# Patient Record
Sex: Male | Born: 1975
Health system: Southern US, Community
[De-identification: ages and names within clinical notes are randomized; demographics above are authoritative.]

## PROBLEM LIST (undated history)

## (undated) DIAGNOSIS — Z803 Family history of malignant neoplasm of breast: Secondary | ICD-10-CM

## (undated) DIAGNOSIS — M1990 Unspecified osteoarthritis, unspecified site: Secondary | ICD-10-CM

## (undated) DIAGNOSIS — G8929 Other chronic pain: Secondary | ICD-10-CM

## (undated) DIAGNOSIS — Z800 Family history of malignant neoplasm of digestive organs: Secondary | ICD-10-CM

## (undated) DIAGNOSIS — M25569 Pain in unspecified knee: Secondary | ICD-10-CM

## (undated) DIAGNOSIS — D490 Neoplasm of unspecified behavior of digestive system: Secondary | ICD-10-CM

## (undated) DIAGNOSIS — F32A Depression, unspecified: Secondary | ICD-10-CM

## (undated) DIAGNOSIS — K219 Gastro-esophageal reflux disease without esophagitis: Secondary | ICD-10-CM

## (undated) DIAGNOSIS — F329 Major depressive disorder, single episode, unspecified: Secondary | ICD-10-CM

## (undated) DIAGNOSIS — M549 Dorsalgia, unspecified: Secondary | ICD-10-CM

## (undated) HISTORY — DX: Gastro-esophageal reflux disease without esophagitis: K21.9

## (undated) HISTORY — DX: Family history of malignant neoplasm of breast: Z80.3

## (undated) HISTORY — PX: KNEE SURGERY: SHX244

## (undated) HISTORY — DX: Family history of malignant neoplasm of digestive organs: Z80.0

## (undated) HISTORY — PX: MOUTH SURGERY: SHX715

---

## 2002-06-22 ENCOUNTER — Emergency Department (HOSPITAL_COMMUNITY): Admission: EM | Admit: 2002-06-22 | Discharge: 2002-06-22 | Payer: Self-pay | Admitting: Emergency Medicine

## 2002-06-22 ENCOUNTER — Encounter: Payer: Self-pay | Admitting: Emergency Medicine

## 2002-06-28 ENCOUNTER — Ambulatory Visit (HOSPITAL_COMMUNITY): Admission: RE | Admit: 2002-06-28 | Discharge: 2002-06-28 | Payer: Self-pay | Admitting: Gastroenterology

## 2002-06-29 ENCOUNTER — Encounter: Admission: RE | Admit: 2002-06-29 | Discharge: 2002-06-29 | Payer: Self-pay | Admitting: Family Medicine

## 2002-06-29 ENCOUNTER — Encounter: Payer: Self-pay | Admitting: Family Medicine

## 2002-07-13 ENCOUNTER — Encounter: Payer: Self-pay | Admitting: Gastroenterology

## 2002-07-13 ENCOUNTER — Ambulatory Visit (HOSPITAL_COMMUNITY): Admission: RE | Admit: 2002-07-13 | Discharge: 2002-07-13 | Payer: Self-pay | Admitting: Gastroenterology

## 2002-07-30 ENCOUNTER — Ambulatory Visit (HOSPITAL_COMMUNITY): Admission: RE | Admit: 2002-07-30 | Discharge: 2002-07-30 | Payer: Self-pay | Admitting: Neurosurgery

## 2002-07-30 ENCOUNTER — Encounter: Payer: Self-pay | Admitting: Gastroenterology

## 2002-08-16 ENCOUNTER — Encounter: Admission: RE | Admit: 2002-08-16 | Discharge: 2002-08-16 | Payer: Self-pay | Admitting: Gastroenterology

## 2002-08-16 ENCOUNTER — Encounter: Payer: Self-pay | Admitting: Gastroenterology

## 2003-02-02 ENCOUNTER — Encounter: Admission: RE | Admit: 2003-02-02 | Discharge: 2003-03-09 | Payer: Self-pay | Admitting: Family Medicine

## 2003-02-06 ENCOUNTER — Emergency Department (HOSPITAL_COMMUNITY): Admission: EM | Admit: 2003-02-06 | Discharge: 2003-02-06 | Payer: Self-pay | Admitting: Emergency Medicine

## 2003-02-14 ENCOUNTER — Ambulatory Visit (HOSPITAL_COMMUNITY): Admission: RE | Admit: 2003-02-14 | Discharge: 2003-02-14 | Payer: Self-pay | Admitting: Family Medicine

## 2003-03-04 ENCOUNTER — Ambulatory Visit (HOSPITAL_COMMUNITY): Admission: RE | Admit: 2003-03-04 | Discharge: 2003-03-05 | Payer: Self-pay | Admitting: Neurosurgery

## 2003-05-05 ENCOUNTER — Ambulatory Visit (HOSPITAL_COMMUNITY): Admission: RE | Admit: 2003-05-05 | Discharge: 2003-05-05 | Payer: Self-pay | Admitting: Neurosurgery

## 2003-10-12 ENCOUNTER — Encounter: Admission: RE | Admit: 2003-10-12 | Discharge: 2003-10-12 | Payer: Self-pay | Admitting: Neurosurgery

## 2003-12-05 ENCOUNTER — Ambulatory Visit (HOSPITAL_COMMUNITY): Admission: RE | Admit: 2003-12-05 | Discharge: 2003-12-06 | Payer: Self-pay | Admitting: Neurosurgery

## 2004-03-26 ENCOUNTER — Ambulatory Visit (HOSPITAL_COMMUNITY): Admission: RE | Admit: 2004-03-26 | Discharge: 2004-03-26 | Payer: Self-pay | Admitting: Gastroenterology

## 2004-09-27 ENCOUNTER — Ambulatory Visit (HOSPITAL_COMMUNITY): Admission: RE | Admit: 2004-09-27 | Discharge: 2004-09-27 | Payer: Self-pay | Admitting: Neurosurgery

## 2007-03-20 ENCOUNTER — Ambulatory Visit (HOSPITAL_BASED_OUTPATIENT_CLINIC_OR_DEPARTMENT_OTHER): Admission: RE | Admit: 2007-03-20 | Discharge: 2007-03-20 | Payer: Self-pay | Admitting: Orthopedic Surgery

## 2009-09-19 ENCOUNTER — Ambulatory Visit (HOSPITAL_COMMUNITY): Admission: RE | Admit: 2009-09-19 | Discharge: 2009-09-20 | Payer: Self-pay | Admitting: Specialist

## 2010-03-17 ENCOUNTER — Encounter: Payer: Self-pay | Admitting: *Deleted

## 2010-03-18 ENCOUNTER — Encounter: Payer: Self-pay | Admitting: Gastroenterology

## 2010-03-18 ENCOUNTER — Encounter: Payer: Self-pay | Admitting: *Deleted

## 2010-05-12 LAB — CBC
HCT: 42.9 % (ref 39.0–52.0)
Hemoglobin: 14.8 g/dL (ref 13.0–17.0)
MCH: 32.9 pg (ref 26.0–34.0)
MCH: 33.2 pg (ref 26.0–34.0)
MCHC: 34.5 g/dL (ref 30.0–36.0)
MCV: 95.4 fL (ref 78.0–100.0)
Platelets: 214 10*3/uL (ref 150–400)
Platelets: 256 10*3/uL (ref 150–400)
RBC: 3.97 MIL/uL — ABNORMAL LOW (ref 4.22–5.81)
RBC: 4.5 MIL/uL (ref 4.22–5.81)
RDW: 12.8 % (ref 11.5–15.5)
RDW: 12.9 % (ref 11.5–15.5)
WBC: 12.4 10*3/uL — ABNORMAL HIGH (ref 4.0–10.5)
WBC: 8.5 10*3/uL (ref 4.0–10.5)

## 2010-05-12 LAB — BASIC METABOLIC PANEL
BUN: 9 mg/dL (ref 6–23)
CO2: 29 mEq/L (ref 19–32)
Calcium: 8.9 mg/dL (ref 8.4–10.5)
Creatinine, Ser: 1.01 mg/dL (ref 0.4–1.5)
GFR calc Af Amer: >60 mL/min (ref >60)

## 2010-05-12 LAB — URINALYSIS, ROUTINE W REFLEX MICROSCOPIC
Bilirubin Urine: NEGATIVE
Glucose, UA: NEGATIVE mg/dL
Hgb urine dipstick: NEGATIVE
Ketones, ur: NEGATIVE mg/dL
Nitrite: NEGATIVE
Protein, ur: NEGATIVE mg/dL
Specific Gravity, Urine: 1.019 (ref 1.005–1.030)
Urobilinogen, UA: 0.2 mg/dL (ref 0.0–1.0)
pH: 6.5 (ref 5.0–8.0)

## 2010-05-12 LAB — COMPREHENSIVE METABOLIC PANEL
ALT: 42 U/L (ref 0–53)
AST: 21 U/L (ref 0–37)
Albumin: 4.5 g/dL (ref 3.5–5.2)
Alkaline Phosphatase: 99 U/L (ref 39–117)
BUN: 11 mg/dL (ref 6–23)
CO2: 26 mEq/L (ref 19–32)
Calcium: 9.4 mg/dL (ref 8.4–10.5)
Chloride: 104 mEq/L (ref 96–112)
Creatinine, Ser: 1.03 mg/dL (ref 0.4–1.5)
GFR calc Af Amer: >60 mL/min (ref >60)
GFR calc non Af Amer: >60 mL/min (ref >60)
Glucose, Bld: 99 mg/dL (ref 70–99)
Potassium: 4.2 mEq/L (ref 3.5–5.1)
Sodium: 139 mEq/L (ref 135–145)
Total Bilirubin: 0.2 mg/dL — ABNORMAL LOW (ref 0.3–1.2)
Total Protein: 8 g/dL (ref 6.0–8.3)

## 2010-05-12 LAB — TYPE AND SCREEN
ABO/RH(D): A POS
Antibody Screen: NEGATIVE

## 2010-05-12 LAB — SURGICAL PCR SCREEN
MRSA, PCR: NEGATIVE
Staphylococcus aureus: POSITIVE — AB

## 2010-05-12 LAB — DIFFERENTIAL
Basophils Absolute: 0 10*3/uL (ref 0.0–0.1)
Basophils Relative: 0 % (ref 0–1)
Eosinophils Absolute: 0.2 10*3/uL (ref 0.0–0.7)
Eosinophils Relative: 2 % (ref 0–5)
Lymphocytes Relative: 23 % (ref 12–46)
Lymphs Abs: 1.9 10*3/uL (ref 0.7–4.0)
Monocytes Absolute: 0.5 10*3/uL (ref 0.1–1.0)
Monocytes Relative: 6 % (ref 3–12)
Neutro Abs: 5.8 10*3/uL (ref 1.7–7.7)
Neutrophils Relative %: 69 % (ref 43–77)

## 2010-05-12 LAB — ABO/RH: ABO/RH(D): A POS

## 2010-05-12 LAB — PROTIME-INR
INR: 0.99 (ref 0.00–1.49)
Prothrombin Time: 13 seconds (ref 11.6–15.2)

## 2010-05-12 LAB — APTT: aPTT: 30 seconds (ref 24–37)

## 2010-07-10 NOTE — Op Note (Signed)
Andre Simmons, Andre Simmons              ACCOUNT NO.:  [id number]   MEDICAL RECORD NO.:  [id number]          PATIENT TYPE:  AMB   LOCATION:  DSC                          FACILITY:  [facility]   PHYSICIAN:  Harvie Junior, M.D.   DATE OF BIRTH:  1975/06/27   DATE OF PROCEDURE:  03/20/2007  DATE OF DISCHARGE:                               OPERATIVE REPORT   POSTOPERATIVE DIAGNOSIS:  Medial meniscal tear with chondromalacia  patella.   POSTOPERATIVE DIAGNOSES:  1. Medial meniscal tear with chondromalacia patella.  2. Medial plica.   PROCEDURE:  1. Partial posterior compartment medial meniscectomy with      corresponding chondroplasty in the medial femoral compartment.  2. Chondroplasty of the patella femoral compartment.  3. Debridement of large medial shelf plica.   SURGEON:  Harvie Junior, MD   ASSISTANT:  Marshia Ly, PA-C   ANESTHESIA:  General   BRIEF HISTORY:  He is a 35 year-old male with a long history of having  had significant left knee pain.  He had been treated conservatively for  a period of time.  He was having pain, really in that medial side,  predominantly.  We had treated him conservatively for a period of time.  An MRI was obtained which showed some chondromalacia patella and  otherwise unremarkable.  He was taken to the operating room for  operative knee arthroscopy after failure of conservative care.   PROCEDURE:  The patient was taken to the operating room after adequate  anesthesia was obtained with a general anestheticThe patient was placed  supine on the operating table.  The left leg was prepped and draped in  the usual sterile fashion.  Following this, routine arthroscopy  examination of the knee revealed there was an obvious chondral area of  injury on the medial patella facet.  This was debrided to a smooth  stable rim.  The patella-femoral trochlear had some blistering and some  small defects, which were debrided.  Attention was turned medially.  There  was some really dense medial plaque.  It was really more like scar  than anything else.  This was really where he had the most significant  amounts of pain.  This was debrided at length with a shaver to free up  that medial patellar facet.  Attention was turned to the medial  compartment grade II chondral injury.  There was a meniscal tear at the  meniscal root medially, which was debrided with a straight biting  forceps.  The ACL was evaluated and noted to be a little bit thin in  nature and kind of laying down.  It did come to a solid end point when  you stressed him, but it certainly was not a normal-appearing ACL.  I  went over all and that compartment was normal.  The attention was then  turned back up to the medial, in which final debridement was made back  into the medial plica, where again at length we debrided this really  sort of plica versus scar band that was on that medial side in the area  of his old  incision and then went back up in that patella-femoral joint  and re debrided that.  At that point, the knee was scoped and thoroughly  irrigated.  A final check was made for loosen fragments and pieces of  cartilage, seeing none.  The knee was  suctioned dry.  The arthroscope was pulled and __________ with a  bandage.  Sterile compressive bandage was applied and the patient was  taken to the recovery room and was noted to be in satisfactory  condition.   ESTIMATED BLOOD LOSS:  None.      Harvie Junior, M.D.  Electronically Signed     JLG/MEDQ  D:  03/20/2007  T:  03/20/2007  Job:  [id number]   cc:   Harvie Junior, M.D.

## 2010-07-13 NOTE — Op Note (Signed)
Andre Simmons, Andre Simmons              ACCOUNT NO.:  [id number]   MEDICAL RECORD NO.:  [id number]          PATIENT TYPE:  AMB   LOCATION:  ENDO                         FACILITY:  [facility]   PHYSICIAN:  Graylin Shiver, M.D.   DATE OF BIRTH:  1975-09-19   DATE OF PROCEDURE:  03/26/2004  DATE OF DISCHARGE:                                 OPERATIVE REPORT   PROCEDURE:  Esophagogastroduodenoscopy.   ENDOSCOPIST:  Graylin Shiver, M.D.   INDICATIONS:  Persistent vomiting, weight loss, mild hematemesis.   Informed consent was obtained after explanation of the risks of bleeding,  infection and perforation.   PREMEDICATION:  Fentanyl 75 mcg IV, Versed 8 mg IV.   DESCRIPTION OF PROCEDURE:  With the patient in the left lateral decubitus  position, the Olympus gastroscope was inserted into the oropharynx and  passed into the esophagus.  It was advanced down the esophagus, then into  the stomach, and into the duodenum.  The second portion and bulb of the  duodenum looked normal.  The stomach had a normal-appearing pylorus and  antrum.  The body of the stomach looked normal. Upon retroflexion, there was  some redness up in the fundal area.  I suspect this may be just some  irritation from vomiting. There were no ulcers. The esophagus looked normal  in its entirety.  He tolerated the procedure well without complications.   IMPRESSION:  There is some redness in the fundus of the stomach, otherwise  normal endoscopy.   PLAN:  Since this patient is experiencing vomiting and he had a gastric  motility disorder in the past, and since nothing was found specifically on  this EGD, I am going to order another gastric emptying study.  I will have  the patient take Nexium 40 mg b.i.d. for now.  If the gastric emptying study  is abnormal showing gastroparesis again, I will add a prokinetic agent such  as Zelnorm.      SFG/MEDQ  D:  03/26/2004  T:  03/26/2004  Job:  [id number]   cc:   Virl Son, M.D.

## 2010-07-13 NOTE — Op Note (Signed)
   NAME:  Andre Simmons, Andre Simmons                        ACCOUNT NO.:  [id number]   MEDICAL RECORD NO.:  [id number]                   PATIENT TYPE:  EMS   LOCATION:  MAJO                                 FACILITY:  [facility]   PHYSICIAN:  Graylin Shiver, M.D.                DATE OF BIRTH:  [birth year]   DATE OF PROCEDURE:  06/22/2002  DATE OF DISCHARGE:  06/22/2002                                 OPERATIVE REPORT   PROCEDURE:  Esophagogastroduodenoscopy with biopsy for CLOtest.   INDICATION FOR PROCEDURE:  Chronic heartburn with worsening symptoms  recently.  There has been some associated vomiting and mild hematemesis.   Informed consent was obtained.   PREMEDICATION:  Demerol 40 mg IV, Versed 4 mg IV.   DESCRIPTION OF PROCEDURE:  With the patient in the left lateral decubitus  position, the Olympus gastroscope was inserted into the oropharynx and  passed into the esophagus.  It was advanced down the esophagus, then into  the stomach and into the duodenum.  The second portion and bulb of the  duodenum were normal.  The stomach revealed a mild diffuse erythematous  appearance to the mucosa compatible with gastritis but no ulcers or erosions  were seen.  The findings were very minimal.  Biopsy for CLOtest was obtained  to look for evidence of Helicobacter pylori.  No lesions were seen in the  fundus or cardia of the stomach.  The esophagus looked normal in its  entirety with the esophagogastric junction being located at 42 cm from the  incisor teeth region.  He tolerated the procedure well without  complications.   IMPRESSION:  Gastritis.   RECOMMENDATIONS:  I suspect that the patient's chronic symptoms and  worsening of symptoms is due to gastroesophageal reflux.  There is no  evidence of mucosal damage in the esophagus.  I have recommended that he  continue on Aciphex 20 mg b.i.d. for symptomatic relief for the time being.  Hopefully, in the near future he can decrease the medication to  once a day  again.  In light of his chronic heartburn, he should probably remain on a  proton pump inhibitor long-term.  He has reported to me that the increasing  of the Aciphex to b.i.d. dosage has already helped.                                                Graylin Shiver, M.D.    Germain Osgood  D:  06/28/2002  T:  06/28/2002  Job:  [id number]   cc:   Vikki Ports, M.D.  [address]  [place]  [place] [address]  Fax: [phone]

## 2010-07-13 NOTE — Op Note (Signed)
NAMEVALERIE, FREDIN              ACCOUNT NO.:  [id number]   MEDICAL RECORD NO.:  [id number]          PATIENT TYPE:  OIB   LOCATION:  3009                         FACILITY:  [facility]   PHYSICIAN:  Kathaleen Maser. Pool, M.D.    DATE OF BIRTH:  April 27, 1975   DATE OF PROCEDURE:  12/05/2003  DATE OF DISCHARGE:                                 OPERATIVE REPORT   PREOPERATIVE DIAGNOSES:  Recurrent right L5-S1 herniated nucleus pulposus  with radiculopathy.   POSTOPERATIVE DIAGNOSES:  Recurrent L5-S1 herniated nucleus pulposus with  radiculopathy.   PROCEDURE:  Right L5-S1 re-exploration of laminotomy with redo micro-  diskectomy.   SURGEON:  Kathaleen Maser. Pool, M.D.   ASSISTANT:  Donalee Citrin, M.D.   ANESTHESIA:  General endotracheal.   INDICATIONS FOR PROCEDURE:  The patient is a 35 year old male who is status  post previous right-sided L4-5 and L5-S1 laminotomy and micro-diskectomy for  the treatment of back and right lower extremity pain.  Postoperatively the  patient developed recurrent severe right lower extremity pain consistent  with a right-sided S1 radiculopathy.  A workup has demonstrated evidence of  some element of recurrent subligamentous disk herniation off to the right  side at L5-S1.  We have discussed the options of his management, including  the possibility of undergoing a right-sided L5-S1 redo micro-diskectomy in  hopes of improving his symptoms.  The patient is aware of the risks and  benefits, and wishes to proceed.   DESCRIPTION OF PROCEDURE:  The patient is taken to the operating room and  placed on the operating room table in the supine position.  After an  adequate level of anesthesia was achieved, the patient was placed prone onto  the Wilson frame and appropriately padded.  The patient's lumbar region was  prepped and draped sterilely.  A #10 blade is used to make a linear skin  incision overlying the L5-S1 interspace.  This is carried down sharply in  the midline.  A  subperiosteal dissection is performed through the lamina and  facet joint of L5 and S1 on the right.  A deep self-retaining retractor was  placed.  Intraoperative x-ray was taken.  The level was confirmed.  A  laminotomy was then performed using a high-speed drill and Kerrison  rongeurs, to widen the previously-existing laminotomy.  The epidural scar  was dissected free.  The underlying thecal sac and right-sided L5 nerve root  were identified. The right-sided S1 nerve root was identified.  The  microscope was brought into the field.  Using microdissection, the right-  sided S1 nerve root and the underlying disk herniation.  The epidural venous  plexus and epidural scar were dissected free.  The thecal sac and S1 nerve  root were gradually mobilized and retracted toward the midline.  The disk  space and disk herniation were apparent.  This was then incised with the #15  blade and retracted __________ .  A wide disk space cleanout was then  achieved using pituitary rongeurs upbiting pituitary rongeurs and Epstein  curets.  All loose or observed degenerative disk material was removed from  the  interspace.  All elements of the disk herniation were completely  resected.  At this point a very thorough diskectomy was performed.  There  was no evidence of area of additional compression.  The wound was then  copiously irrigated with antibiotic solution.  Gelfoam was placed for  operative hemostasis and found to be good.  The microscope and retractors  were removed.  Hemostasis in the musculature was achieved with electrocautery.  The wound  was closed in layers with Vicryl sutures.  Steri-Strips and a sterile  dressing were applied.  There were no apparent complications.  The patient tolerated the procedure well, and he returns to the recovery  room postoperatively.       HAP/MEDQ  D:  12/05/2003  T:  12/05/2003  Job:  [id number]
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
## 2010-11-15 LAB — POCT HEMOGLOBIN-HEMACUE: Hemoglobin: 15.3

## 2011-07-03 DIAGNOSIS — M25569 Pain in unspecified knee: Secondary | ICD-10-CM | POA: Diagnosis not present

## 2011-08-01 DIAGNOSIS — Z96659 Presence of unspecified artificial knee joint: Secondary | ICD-10-CM | POA: Diagnosis not present

## 2011-08-01 DIAGNOSIS — S83509A Sprain of unspecified cruciate ligament of unspecified knee, initial encounter: Secondary | ICD-10-CM | POA: Diagnosis not present

## 2011-08-01 DIAGNOSIS — M25569 Pain in unspecified knee: Secondary | ICD-10-CM | POA: Diagnosis not present

## 2011-08-01 DIAGNOSIS — M25469 Effusion, unspecified knee: Secondary | ICD-10-CM | POA: Diagnosis not present

## 2011-08-15 ENCOUNTER — Emergency Department (HOSPITAL_BASED_OUTPATIENT_CLINIC_OR_DEPARTMENT_OTHER): Payer: Medicare Other

## 2011-08-15 ENCOUNTER — Encounter (HOSPITAL_BASED_OUTPATIENT_CLINIC_OR_DEPARTMENT_OTHER): Payer: Self-pay | Admitting: *Deleted

## 2011-08-15 ENCOUNTER — Emergency Department (HOSPITAL_BASED_OUTPATIENT_CLINIC_OR_DEPARTMENT_OTHER)
Admission: EM | Admit: 2011-08-15 | Discharge: 2011-08-15 | Disposition: A | Discharge status: Home / Self Care | Payer: Medicare Other | Attending: Emergency Medicine | Admitting: Emergency Medicine

## 2011-08-15 DIAGNOSIS — M25469 Effusion, unspecified knee: Secondary | ICD-10-CM | POA: Diagnosis not present

## 2011-08-15 DIAGNOSIS — M25569 Pain in unspecified knee: Secondary | ICD-10-CM | POA: Diagnosis not present

## 2011-08-15 DIAGNOSIS — M25562 Pain in left knee: Secondary | ICD-10-CM

## 2011-08-15 DIAGNOSIS — Z96659 Presence of unspecified artificial knee joint: Secondary | ICD-10-CM | POA: Insufficient documentation

## 2011-08-15 DIAGNOSIS — M25462 Effusion, left knee: Secondary | ICD-10-CM

## 2011-08-15 HISTORY — DX: Pain in unspecified knee: M25.569

## 2011-08-15 HISTORY — DX: Other chronic pain: G89.29

## 2011-08-15 HISTORY — DX: Dorsalgia, unspecified: M54.9

## 2011-08-15 MED ORDER — OXYCODONE-ACETAMINOPHEN 5-325 MG PO TABS: 1.0000 | ORAL_TABLET | Freq: Four times a day (QID) | ORAL | Status: AC | PRN

## 2011-08-15 NOTE — ED Notes (Signed)
Pt reports having acl and partial knee replacement 3 yrs ago, past couple of months knee pain has gradually increased,  Pt taking anti-inflammatory prescribed by orthopedist at [facility] first appointment he could get was august, edema noted to distal and lateral knee cap, pt unable to completely extend knee, pt unable to remove recent injury.

## 2011-08-15 NOTE — ED Notes (Signed)
Drove himself here

## 2011-08-15 NOTE — Discharge Instructions (Signed)

## 2011-08-15 NOTE — ED Provider Notes (Signed)
History     CSN: [id number]  Arrival date & time 08/15/11  2004   First MD Initiated Contact with Patient 08/15/11 2147      Chief Complaint  Patient presents with  . Knee Pain    (Consider location/radiation/quality/duration/timing/severity/associated sxs/prior treatment) Patient is a 36 y.o. male presenting with knee pain. The history is provided by the patient.  Knee Pain This is a chronic problem. Episode onset: Ongoing pain for years but worse in the last 4 days. The problem occurs constantly. The problem has been gradually worsening. Associated symptoms comments: Swelling of the knee joint. No fever. The symptoms are aggravated by walking and standing. The symptoms are relieved by ice, relaxation and NSAIDs. The treatment provided no relief.    Past Medical History  Diagnosis Date  . Knee pain   . Chronic back pain     Past Surgical History  Procedure Date  . Knee surgery   . Back surgery     No family history on file.  History  Substance Use Topics  . Smoking status: Never Smoker   . Smokeless tobacco: Not on file  . Alcohol Use: No      Review of Systems  Constitutional: Negative for fever and appetite change.  Musculoskeletal: Positive for joint swelling.  All other systems reviewed and are negative.    Allergies  Review of patient's allergies indicates no known allergies.  Home Medications   Current Outpatient Rx  Name Route Sig Dispense Refill  . ESOMEPRAZOLE MAGNESIUM 40 MG PO CPDR Oral Take 40 mg by mouth 2 (two) times daily.    Marland Kitchen NABUMETONE 750 MG PO TABS Oral Take 750 mg by mouth 2 (two) times daily.      BP 128/93  Pulse 107  Temp 98.9 F (37.2 C) (Oral)  Resp 18  Ht 6\' 2"  (1.88 m)  Wt 225 lb (102.059 kg)  BMI 28.89 kg/m2  SpO2 98%  Physical Exam  Nursing note and vitals reviewed. Constitutional: He is oriented to person, place, and time. He appears well-developed and well-nourished. No distress.  HENT:  Head: Normocephalic  and atraumatic.  Musculoskeletal:       Left knee: He exhibits effusion, LCL laxity and bony tenderness. He exhibits no deformity and no MCL laxity. tenderness found. Lateral joint line tenderness noted. No medial joint line tenderness noted.       Healed surgical scars on left knee and tenderness along the surgical scar.  No warmth, erythema in the knee and over 90 degrees of ROM  Neurological: He is alert and oriented to person, place, and time.  Skin: Skin is warm and dry. No erythema.    ED Course  Procedures (including critical care time)  Labs Reviewed - No data to display Dg Knee 2 Views Left  08/15/2011  *RADIOLOGY REPORT*  Clinical Data: Left knee pain; history of left knee surgery 3 years ago.  LEFT KNEE - 1-2 VIEW  Comparison: Left knee radiographs performed 01/29/2007, and left knee MRI performed 03/10/2007  Findings: The patient has a medial compartment prosthesis, which appears in grossly normal alignment, without evidence of significant loosening or fracture.  The lateral and patellofemoral compartments appear grossly preserved, with minimal osteophyte formation noted at the patellofemoral compartment.  A small knee joint effusion is seen.  There is no evidence of fracture or dislocation.  A fabella is noted.  The visualized soft tissues are otherwise unremarkable in appearance.  IMPRESSION:  1.  No evidence of fracture or  dislocation. 2.  Medial compartment prosthesis demonstrates grossly normal alignment, without evidence of loosening. 3.  Small knee joint effusion seen.  Original Report Authenticated By: Tonia Ghent, M.D.     1. Knee effusion, left   2. Left knee pain       MDM   Patient with chronic knee problems status post anterior cruciate ligament repair and knee replacement. He states he was seen at [facility] for a second opinion and currently was put in the brace and told that he had loose ligaments. He states over the last 4 days he's had worsening pain in the and  severe pain with walking. On exam he has healed surgical scars without any sign of a septic joint. He has tenderness along his scar and laxity of his LCL.  Film shows small knee effusion but otherwise normal hardware. This was discussed with the patient and he is going to followup with the specialist he was given pain control.        Gwyneth Sprout, MD 08/15/11 2230

## 2011-08-15 NOTE — ED Notes (Signed)
Chronic pain in his left knee. Pain in his left knee x 5 days. Is seeing a ortho specialist at [facility] but pain is getting worse.

## 2011-08-26 DIAGNOSIS — IMO0002 Reserved for concepts with insufficient information to code with codable children: Secondary | ICD-10-CM | POA: Diagnosis not present

## 2011-09-06 DIAGNOSIS — M2350 Chronic instability of knee, unspecified knee: Secondary | ICD-10-CM | POA: Diagnosis not present

## 2011-09-06 DIAGNOSIS — M25569 Pain in unspecified knee: Secondary | ICD-10-CM | POA: Diagnosis not present

## 2012-10-09 DIAGNOSIS — B009 Herpesviral infection, unspecified: Secondary | ICD-10-CM | POA: Diagnosis not present

## 2012-10-09 DIAGNOSIS — K219 Gastro-esophageal reflux disease without esophagitis: Secondary | ICD-10-CM | POA: Diagnosis not present

## 2012-11-07 ENCOUNTER — Encounter (HOSPITAL_BASED_OUTPATIENT_CLINIC_OR_DEPARTMENT_OTHER): Payer: Self-pay

## 2012-11-07 ENCOUNTER — Emergency Department (HOSPITAL_BASED_OUTPATIENT_CLINIC_OR_DEPARTMENT_OTHER)
Admission: EM | Admit: 2012-11-07 | Discharge: 2012-11-07 | Disposition: A | Discharge status: Home / Self Care | Payer: Medicare Other | Attending: Emergency Medicine | Admitting: Emergency Medicine

## 2012-11-07 ENCOUNTER — Emergency Department (HOSPITAL_BASED_OUTPATIENT_CLINIC_OR_DEPARTMENT_OTHER): Payer: Medicare Other

## 2012-11-07 DIAGNOSIS — Z79899 Other long term (current) drug therapy: Secondary | ICD-10-CM | POA: Diagnosis not present

## 2012-11-07 DIAGNOSIS — Z9889 Other specified postprocedural states: Secondary | ICD-10-CM | POA: Diagnosis not present

## 2012-11-07 DIAGNOSIS — R079 Chest pain, unspecified: Secondary | ICD-10-CM | POA: Diagnosis not present

## 2012-11-07 DIAGNOSIS — M549 Dorsalgia, unspecified: Secondary | ICD-10-CM

## 2012-11-07 DIAGNOSIS — M545 Low back pain: Secondary | ICD-10-CM | POA: Diagnosis not present

## 2012-11-07 MED ORDER — PREDNISONE 10 MG PO TABS: ORAL_TABLET | ORAL | Status: DC

## 2012-11-07 MED ORDER — OXYCODONE-ACETAMINOPHEN 5-325 MG PO TABS
2.0000 | ORAL_TABLET | ORAL | Status: DC | PRN
Start: 2012-11-07 — End: 2017-09-06

## 2012-11-07 NOTE — ED Provider Notes (Signed)
Medical screening examination/treatment/procedure(s) were performed by non-physician practitioner and as supervising physician I was immediately available for consultation/collaboration.   Dagmar Hait, MD 11/07/12 [phone]

## 2012-11-07 NOTE — ED Notes (Signed)
Patient here with increasing lower back pain x 4 days. Reports that he has had back surgery in past but denies any new injury. Reports that he has been more active than usual. ambulatory

## 2012-11-07 NOTE — ED Provider Notes (Signed)
CSN: [id number]     Arrival date & time 11/07/12  1526 History   First MD Initiated Contact with Patient 11/07/12 1632     Chief Complaint  Patient presents with  . Back Pain   (Consider location/radiation/quality/duration/timing/severity/associated sxs/prior Treatment) Patient is a 37 y.o. male presenting with back pain. The history is provided by the patient.  Back Pain Location:  Lumbar spine and thoracic spine Quality:  Aching Pain severity:  Moderate Pain is:  Same all the time Onset quality:  Gradual Duration:  4 days Timing:  Constant Progression:  Worsening Chronicity:  New Relieved by:  Nothing Worsened by:  Nothing tried Associated symptoms: no abdominal pain     Past Medical History  Diagnosis Date  . Knee pain   . Chronic back pain    Past Surgical History  Procedure Laterality Date  . Knee surgery    . Back surgery     No family history on file. History  Substance Use Topics  . Smoking status: Never Smoker   . Smokeless tobacco: Not on file  . Alcohol Use: No    Review of Systems  Gastrointestinal: Negative for abdominal pain.  Musculoskeletal: Positive for back pain.  All other systems reviewed and are negative.    Allergies  Review of patient's allergies indicates no known allergies.  Home Medications   Current Outpatient Rx  Name  Route  Sig  Dispense  Refill  . omeprazole (PRILOSEC) 40 MG capsule   Oral   Take 40 mg by mouth daily.          BP 152/110  Pulse 92  Temp(Src) 98.8 F (37.1 C) (Oral)  Resp 18  SpO2 98% Physical Exam  Nursing note and vitals reviewed. Constitutional: He appears well-developed and well-nourished.  HENT:  Head: Normocephalic.  Neck: Normal range of motion.  Cardiovascular: Normal rate.   Pulmonary/Chest: Effort normal and breath sounds normal.  Abdominal: Soft. Bowel sounds are normal.  Musculoskeletal: Normal range of motion. He exhibits tenderness.  Tender low back,  Tender left upper ribs,    nv and ns intact  Neurological: He is alert.  Skin: Skin is warm.    ED Course  Procedures (including critical care time) Labs Review Labs Reviewed - No data to display Imaging Review Dg Ribs Unilateral W/chest Left  11/07/2012   EXAM: LEFT RIBS AND CHEST - 3+ VIEW  COMPARISON:  None.  FINDINGS: No fracture or other bone lesions are seen involving the ribs. There is no evidence of pneumothorax or pleural effusion. Both lungs are clear. Heart size and mediastinal contours are within normal limits.  IMPRESSION: Negative.   Electronically Signed   By: Natasha Mead   On: 11/07/2012 17:19    MDM   1. Back pain        Elson Areas, [place] 11/07/12 4098

## 2012-11-10 DIAGNOSIS — S20219A Contusion of unspecified front wall of thorax, initial encounter: Secondary | ICD-10-CM | POA: Diagnosis not present

## 2013-02-09 DIAGNOSIS — S20219A Contusion of unspecified front wall of thorax, initial encounter: Secondary | ICD-10-CM | POA: Diagnosis not present

## 2013-02-09 DIAGNOSIS — Z1389 Encounter for screening for other disorder: Secondary | ICD-10-CM | POA: Diagnosis not present

## 2013-02-09 DIAGNOSIS — Z6830 Body mass index (BMI) 30.0-30.9, adult: Secondary | ICD-10-CM | POA: Diagnosis not present

## 2013-04-05 DIAGNOSIS — M25569 Pain in unspecified knee: Secondary | ICD-10-CM | POA: Diagnosis not present

## 2013-04-05 DIAGNOSIS — M25469 Effusion, unspecified knee: Secondary | ICD-10-CM | POA: Diagnosis not present

## 2013-04-05 DIAGNOSIS — Z96659 Presence of unspecified artificial knee joint: Secondary | ICD-10-CM | POA: Diagnosis not present

## 2013-04-05 DIAGNOSIS — Z471 Aftercare following joint replacement surgery: Secondary | ICD-10-CM | POA: Diagnosis not present

## 2013-04-28 DIAGNOSIS — M25469 Effusion, unspecified knee: Secondary | ICD-10-CM | POA: Diagnosis not present

## 2013-04-28 DIAGNOSIS — M25569 Pain in unspecified knee: Secondary | ICD-10-CM | POA: Diagnosis not present

## 2013-04-28 DIAGNOSIS — Z471 Aftercare following joint replacement surgery: Secondary | ICD-10-CM | POA: Diagnosis not present

## 2013-04-28 DIAGNOSIS — Z96659 Presence of unspecified artificial knee joint: Secondary | ICD-10-CM | POA: Diagnosis not present

## 2013-05-03 DIAGNOSIS — M238X9 Other internal derangements of unspecified knee: Secondary | ICD-10-CM | POA: Diagnosis not present

## 2013-09-30 DIAGNOSIS — M25569 Pain in unspecified knee: Secondary | ICD-10-CM | POA: Diagnosis not present

## 2013-09-30 DIAGNOSIS — H9209 Otalgia, unspecified ear: Secondary | ICD-10-CM | POA: Diagnosis not present

## 2013-09-30 DIAGNOSIS — K219 Gastro-esophageal reflux disease without esophagitis: Secondary | ICD-10-CM | POA: Diagnosis not present

## 2013-11-18 DIAGNOSIS — M1712 Unilateral primary osteoarthritis, left knee: Secondary | ICD-10-CM | POA: Insufficient documentation

## 2013-11-18 DIAGNOSIS — Z9889 Other specified postprocedural states: Secondary | ICD-10-CM | POA: Diagnosis not present

## 2013-11-18 DIAGNOSIS — S83512A Sprain of anterior cruciate ligament of left knee, initial encounter: Secondary | ICD-10-CM | POA: Insufficient documentation

## 2013-11-18 DIAGNOSIS — M1710 Unilateral primary osteoarthritis, unspecified knee: Secondary | ICD-10-CM | POA: Diagnosis not present

## 2013-11-18 DIAGNOSIS — M25569 Pain in unspecified knee: Secondary | ICD-10-CM | POA: Diagnosis not present

## 2013-11-18 DIAGNOSIS — S83509A Sprain of unspecified cruciate ligament of unspecified knee, initial encounter: Secondary | ICD-10-CM | POA: Diagnosis not present

## 2013-11-24 DIAGNOSIS — M1710 Unilateral primary osteoarthritis, unspecified knee: Secondary | ICD-10-CM | POA: Diagnosis not present

## 2013-11-24 DIAGNOSIS — S83509A Sprain of unspecified cruciate ligament of unspecified knee, initial encounter: Secondary | ICD-10-CM | POA: Diagnosis not present

## 2014-01-13 DIAGNOSIS — M1712 Unilateral primary osteoarthritis, left knee: Secondary | ICD-10-CM | POA: Diagnosis not present

## 2014-01-13 DIAGNOSIS — M25562 Pain in left knee: Secondary | ICD-10-CM | POA: Diagnosis not present

## 2014-01-13 DIAGNOSIS — S83512A Sprain of anterior cruciate ligament of left knee, initial encounter: Secondary | ICD-10-CM | POA: Diagnosis not present

## 2014-02-23 DIAGNOSIS — Z6831 Body mass index (BMI) 31.0-31.9, adult: Secondary | ICD-10-CM | POA: Diagnosis not present

## 2014-02-23 DIAGNOSIS — G8929 Other chronic pain: Secondary | ICD-10-CM | POA: Diagnosis not present

## 2014-02-23 DIAGNOSIS — M549 Dorsalgia, unspecified: Secondary | ICD-10-CM | POA: Diagnosis not present

## 2014-03-03 DIAGNOSIS — S83412A Sprain of medial collateral ligament of left knee, initial encounter: Secondary | ICD-10-CM | POA: Insufficient documentation

## 2014-03-03 DIAGNOSIS — S83512D Sprain of anterior cruciate ligament of left knee, subsequent encounter: Secondary | ICD-10-CM | POA: Diagnosis not present

## 2014-03-03 DIAGNOSIS — S83412D Sprain of medial collateral ligament of left knee, subsequent encounter: Secondary | ICD-10-CM | POA: Diagnosis not present

## 2014-03-03 DIAGNOSIS — M1712 Unilateral primary osteoarthritis, left knee: Secondary | ICD-10-CM | POA: Diagnosis not present

## 2014-03-03 DIAGNOSIS — Z85028 Personal history of other malignant neoplasm of stomach: Secondary | ICD-10-CM | POA: Diagnosis not present

## 2014-03-03 DIAGNOSIS — Z96659 Presence of unspecified artificial knee joint: Secondary | ICD-10-CM | POA: Diagnosis not present

## 2014-03-30 DIAGNOSIS — Z87891 Personal history of nicotine dependence: Secondary | ICD-10-CM | POA: Diagnosis not present

## 2014-03-30 DIAGNOSIS — M2242 Chondromalacia patellae, left knee: Secondary | ICD-10-CM | POA: Diagnosis not present

## 2014-03-30 DIAGNOSIS — M2352 Chronic instability of knee, left knee: Secondary | ICD-10-CM | POA: Diagnosis not present

## 2014-03-30 DIAGNOSIS — M23212 Derangement of anterior horn of medial meniscus due to old tear or injury, left knee: Secondary | ICD-10-CM | POA: Diagnosis not present

## 2014-03-30 DIAGNOSIS — Z85028 Personal history of other malignant neoplasm of stomach: Secondary | ICD-10-CM | POA: Diagnosis not present

## 2014-03-30 DIAGNOSIS — M238X2 Other internal derangements of left knee: Secondary | ICD-10-CM | POA: Diagnosis not present

## 2014-03-30 DIAGNOSIS — M25262 Flail joint, left knee: Secondary | ICD-10-CM | POA: Diagnosis not present

## 2014-03-30 DIAGNOSIS — G8918 Other acute postprocedural pain: Secondary | ICD-10-CM | POA: Diagnosis not present

## 2014-03-30 DIAGNOSIS — M25462 Effusion, left knee: Secondary | ICD-10-CM | POA: Diagnosis not present

## 2014-03-30 DIAGNOSIS — S83512A Sprain of anterior cruciate ligament of left knee, initial encounter: Secondary | ICD-10-CM | POA: Diagnosis not present

## 2014-03-30 DIAGNOSIS — S83412D Sprain of medial collateral ligament of left knee, subsequent encounter: Secondary | ICD-10-CM | POA: Diagnosis not present

## 2014-03-30 DIAGNOSIS — S83512D Sprain of anterior cruciate ligament of left knee, subsequent encounter: Secondary | ICD-10-CM | POA: Diagnosis not present

## 2014-03-30 DIAGNOSIS — S83412A Sprain of medial collateral ligament of left knee, initial encounter: Secondary | ICD-10-CM | POA: Diagnosis not present

## 2014-03-30 DIAGNOSIS — M65862 Other synovitis and tenosynovitis, left lower leg: Secondary | ICD-10-CM | POA: Diagnosis not present

## 2014-03-30 DIAGNOSIS — M1712 Unilateral primary osteoarthritis, left knee: Secondary | ICD-10-CM | POA: Diagnosis not present

## 2014-03-30 DIAGNOSIS — Z9660 Presence of unspecified orthopedic joint implant: Secondary | ICD-10-CM | POA: Diagnosis not present

## 2014-03-30 DIAGNOSIS — M23242 Derangement of anterior horn of lateral meniscus due to old tear or injury, left knee: Secondary | ICD-10-CM | POA: Diagnosis not present

## 2014-03-31 DIAGNOSIS — M65862 Other synovitis and tenosynovitis, left lower leg: Secondary | ICD-10-CM | POA: Diagnosis not present

## 2014-03-31 DIAGNOSIS — M2242 Chondromalacia patellae, left knee: Secondary | ICD-10-CM | POA: Diagnosis not present

## 2014-03-31 DIAGNOSIS — S83412D Sprain of medial collateral ligament of left knee, subsequent encounter: Secondary | ICD-10-CM | POA: Diagnosis not present

## 2014-03-31 DIAGNOSIS — M23212 Derangement of anterior horn of medial meniscus due to old tear or injury, left knee: Secondary | ICD-10-CM | POA: Diagnosis not present

## 2014-03-31 DIAGNOSIS — G8918 Other acute postprocedural pain: Secondary | ICD-10-CM | POA: Diagnosis not present

## 2014-03-31 DIAGNOSIS — M1712 Unilateral primary osteoarthritis, left knee: Secondary | ICD-10-CM | POA: Diagnosis not present

## 2014-03-31 DIAGNOSIS — S83512D Sprain of anterior cruciate ligament of left knee, subsequent encounter: Secondary | ICD-10-CM | POA: Diagnosis not present

## 2014-04-01 DIAGNOSIS — M1712 Unilateral primary osteoarthritis, left knee: Secondary | ICD-10-CM | POA: Diagnosis not present

## 2014-04-01 DIAGNOSIS — M2242 Chondromalacia patellae, left knee: Secondary | ICD-10-CM | POA: Diagnosis not present

## 2014-04-01 DIAGNOSIS — G8918 Other acute postprocedural pain: Secondary | ICD-10-CM | POA: Diagnosis not present

## 2014-04-01 DIAGNOSIS — M65862 Other synovitis and tenosynovitis, left lower leg: Secondary | ICD-10-CM | POA: Diagnosis not present

## 2014-04-01 DIAGNOSIS — S83412D Sprain of medial collateral ligament of left knee, subsequent encounter: Secondary | ICD-10-CM | POA: Diagnosis not present

## 2014-04-01 DIAGNOSIS — M23212 Derangement of anterior horn of medial meniscus due to old tear or injury, left knee: Secondary | ICD-10-CM | POA: Diagnosis not present

## 2014-04-01 DIAGNOSIS — S83512D Sprain of anterior cruciate ligament of left knee, subsequent encounter: Secondary | ICD-10-CM | POA: Diagnosis not present

## 2014-04-14 DIAGNOSIS — Z9889 Other specified postprocedural states: Secondary | ICD-10-CM | POA: Insufficient documentation

## 2014-04-14 DIAGNOSIS — Z7982 Long term (current) use of aspirin: Secondary | ICD-10-CM | POA: Diagnosis not present

## 2014-04-14 DIAGNOSIS — Z4802 Encounter for removal of sutures: Secondary | ICD-10-CM | POA: Diagnosis not present

## 2014-04-14 DIAGNOSIS — Z4789 Encounter for other orthopedic aftercare: Secondary | ICD-10-CM | POA: Diagnosis not present

## 2014-04-25 DIAGNOSIS — M25562 Pain in left knee: Secondary | ICD-10-CM | POA: Diagnosis not present

## 2014-04-25 DIAGNOSIS — R262 Difficulty in walking, not elsewhere classified: Secondary | ICD-10-CM | POA: Diagnosis not present

## 2014-04-25 DIAGNOSIS — M25462 Effusion, left knee: Secondary | ICD-10-CM | POA: Diagnosis not present

## 2014-04-25 DIAGNOSIS — M25662 Stiffness of left knee, not elsewhere classified: Secondary | ICD-10-CM | POA: Diagnosis not present

## 2014-04-27 DIAGNOSIS — R262 Difficulty in walking, not elsewhere classified: Secondary | ICD-10-CM | POA: Diagnosis not present

## 2014-04-27 DIAGNOSIS — M25462 Effusion, left knee: Secondary | ICD-10-CM | POA: Diagnosis not present

## 2014-04-27 DIAGNOSIS — M25662 Stiffness of left knee, not elsewhere classified: Secondary | ICD-10-CM | POA: Diagnosis not present

## 2014-04-27 DIAGNOSIS — M25562 Pain in left knee: Secondary | ICD-10-CM | POA: Diagnosis not present

## 2014-05-03 DIAGNOSIS — M25462 Effusion, left knee: Secondary | ICD-10-CM | POA: Diagnosis not present

## 2014-05-03 DIAGNOSIS — M25662 Stiffness of left knee, not elsewhere classified: Secondary | ICD-10-CM | POA: Diagnosis not present

## 2014-05-03 DIAGNOSIS — R262 Difficulty in walking, not elsewhere classified: Secondary | ICD-10-CM | POA: Diagnosis not present

## 2014-05-03 DIAGNOSIS — M25562 Pain in left knee: Secondary | ICD-10-CM | POA: Diagnosis not present

## 2014-05-10 DIAGNOSIS — M25562 Pain in left knee: Secondary | ICD-10-CM | POA: Diagnosis not present

## 2014-05-10 DIAGNOSIS — M25662 Stiffness of left knee, not elsewhere classified: Secondary | ICD-10-CM | POA: Diagnosis not present

## 2014-05-10 DIAGNOSIS — R262 Difficulty in walking, not elsewhere classified: Secondary | ICD-10-CM | POA: Diagnosis not present

## 2014-05-10 DIAGNOSIS — M25462 Effusion, left knee: Secondary | ICD-10-CM | POA: Diagnosis not present

## 2014-05-13 DIAGNOSIS — M25662 Stiffness of left knee, not elsewhere classified: Secondary | ICD-10-CM | POA: Diagnosis not present

## 2014-05-13 DIAGNOSIS — M25562 Pain in left knee: Secondary | ICD-10-CM | POA: Diagnosis not present

## 2014-05-13 DIAGNOSIS — M25462 Effusion, left knee: Secondary | ICD-10-CM | POA: Diagnosis not present

## 2014-05-13 DIAGNOSIS — R262 Difficulty in walking, not elsewhere classified: Secondary | ICD-10-CM | POA: Diagnosis not present

## 2014-05-16 DIAGNOSIS — Z4889 Encounter for other specified surgical aftercare: Secondary | ICD-10-CM | POA: Diagnosis not present

## 2014-05-17 DIAGNOSIS — M25562 Pain in left knee: Secondary | ICD-10-CM | POA: Diagnosis not present

## 2014-05-17 DIAGNOSIS — R262 Difficulty in walking, not elsewhere classified: Secondary | ICD-10-CM | POA: Diagnosis not present

## 2014-05-17 DIAGNOSIS — M25662 Stiffness of left knee, not elsewhere classified: Secondary | ICD-10-CM | POA: Diagnosis not present

## 2014-05-17 DIAGNOSIS — M25462 Effusion, left knee: Secondary | ICD-10-CM | POA: Diagnosis not present

## 2014-05-19 DIAGNOSIS — R262 Difficulty in walking, not elsewhere classified: Secondary | ICD-10-CM | POA: Diagnosis not present

## 2014-05-19 DIAGNOSIS — M25562 Pain in left knee: Secondary | ICD-10-CM | POA: Diagnosis not present

## 2014-05-19 DIAGNOSIS — M25462 Effusion, left knee: Secondary | ICD-10-CM | POA: Diagnosis not present

## 2014-05-19 DIAGNOSIS — M25662 Stiffness of left knee, not elsewhere classified: Secondary | ICD-10-CM | POA: Diagnosis not present

## 2014-05-24 DIAGNOSIS — M25562 Pain in left knee: Secondary | ICD-10-CM | POA: Diagnosis not present

## 2014-05-24 DIAGNOSIS — R262 Difficulty in walking, not elsewhere classified: Secondary | ICD-10-CM | POA: Diagnosis not present

## 2014-05-24 DIAGNOSIS — M25462 Effusion, left knee: Secondary | ICD-10-CM | POA: Diagnosis not present

## 2014-05-24 DIAGNOSIS — M25662 Stiffness of left knee, not elsewhere classified: Secondary | ICD-10-CM | POA: Diagnosis not present

## 2014-05-30 DIAGNOSIS — M25462 Effusion, left knee: Secondary | ICD-10-CM | POA: Diagnosis not present

## 2014-05-30 DIAGNOSIS — M25662 Stiffness of left knee, not elsewhere classified: Secondary | ICD-10-CM | POA: Diagnosis not present

## 2014-05-30 DIAGNOSIS — M25562 Pain in left knee: Secondary | ICD-10-CM | POA: Diagnosis not present

## 2014-05-30 DIAGNOSIS — R262 Difficulty in walking, not elsewhere classified: Secondary | ICD-10-CM | POA: Diagnosis not present

## 2014-06-03 DIAGNOSIS — R262 Difficulty in walking, not elsewhere classified: Secondary | ICD-10-CM | POA: Diagnosis not present

## 2014-06-03 DIAGNOSIS — M25462 Effusion, left knee: Secondary | ICD-10-CM | POA: Diagnosis not present

## 2014-06-03 DIAGNOSIS — M25662 Stiffness of left knee, not elsewhere classified: Secondary | ICD-10-CM | POA: Diagnosis not present

## 2014-06-03 DIAGNOSIS — M25562 Pain in left knee: Secondary | ICD-10-CM | POA: Diagnosis not present

## 2014-06-09 DIAGNOSIS — M25662 Stiffness of left knee, not elsewhere classified: Secondary | ICD-10-CM | POA: Diagnosis not present

## 2014-06-09 DIAGNOSIS — M25462 Effusion, left knee: Secondary | ICD-10-CM | POA: Diagnosis not present

## 2014-06-09 DIAGNOSIS — R262 Difficulty in walking, not elsewhere classified: Secondary | ICD-10-CM | POA: Diagnosis not present

## 2014-06-09 DIAGNOSIS — M25562 Pain in left knee: Secondary | ICD-10-CM | POA: Diagnosis not present

## 2014-06-14 DIAGNOSIS — M25562 Pain in left knee: Secondary | ICD-10-CM | POA: Diagnosis not present

## 2014-06-14 DIAGNOSIS — R262 Difficulty in walking, not elsewhere classified: Secondary | ICD-10-CM | POA: Diagnosis not present

## 2014-06-14 DIAGNOSIS — M25462 Effusion, left knee: Secondary | ICD-10-CM | POA: Diagnosis not present

## 2014-06-14 DIAGNOSIS — M25662 Stiffness of left knee, not elsewhere classified: Secondary | ICD-10-CM | POA: Diagnosis not present

## 2014-06-17 DIAGNOSIS — R262 Difficulty in walking, not elsewhere classified: Secondary | ICD-10-CM | POA: Diagnosis not present

## 2014-06-17 DIAGNOSIS — M25662 Stiffness of left knee, not elsewhere classified: Secondary | ICD-10-CM | POA: Diagnosis not present

## 2014-06-17 DIAGNOSIS — M25562 Pain in left knee: Secondary | ICD-10-CM | POA: Diagnosis not present

## 2014-06-17 DIAGNOSIS — M25462 Effusion, left knee: Secondary | ICD-10-CM | POA: Diagnosis not present

## 2014-06-20 DIAGNOSIS — Z4789 Encounter for other orthopedic aftercare: Secondary | ICD-10-CM | POA: Diagnosis not present

## 2014-06-20 DIAGNOSIS — Z7982 Long term (current) use of aspirin: Secondary | ICD-10-CM | POA: Diagnosis not present

## 2014-06-21 DIAGNOSIS — M25562 Pain in left knee: Secondary | ICD-10-CM | POA: Diagnosis not present

## 2014-06-21 DIAGNOSIS — M25662 Stiffness of left knee, not elsewhere classified: Secondary | ICD-10-CM | POA: Diagnosis not present

## 2014-06-21 DIAGNOSIS — R262 Difficulty in walking, not elsewhere classified: Secondary | ICD-10-CM | POA: Diagnosis not present

## 2014-06-21 DIAGNOSIS — M25462 Effusion, left knee: Secondary | ICD-10-CM | POA: Diagnosis not present

## 2014-06-27 DIAGNOSIS — M25462 Effusion, left knee: Secondary | ICD-10-CM | POA: Diagnosis not present

## 2014-06-27 DIAGNOSIS — M25562 Pain in left knee: Secondary | ICD-10-CM | POA: Diagnosis not present

## 2014-06-27 DIAGNOSIS — M25662 Stiffness of left knee, not elsewhere classified: Secondary | ICD-10-CM | POA: Diagnosis not present

## 2014-06-27 DIAGNOSIS — R262 Difficulty in walking, not elsewhere classified: Secondary | ICD-10-CM | POA: Diagnosis not present

## 2014-07-05 DIAGNOSIS — M25662 Stiffness of left knee, not elsewhere classified: Secondary | ICD-10-CM | POA: Diagnosis not present

## 2014-07-05 DIAGNOSIS — M25462 Effusion, left knee: Secondary | ICD-10-CM | POA: Diagnosis not present

## 2014-07-05 DIAGNOSIS — M25562 Pain in left knee: Secondary | ICD-10-CM | POA: Diagnosis not present

## 2014-07-05 DIAGNOSIS — R262 Difficulty in walking, not elsewhere classified: Secondary | ICD-10-CM | POA: Diagnosis not present

## 2014-07-08 DIAGNOSIS — R262 Difficulty in walking, not elsewhere classified: Secondary | ICD-10-CM | POA: Diagnosis not present

## 2014-07-08 DIAGNOSIS — M25562 Pain in left knee: Secondary | ICD-10-CM | POA: Diagnosis not present

## 2014-07-08 DIAGNOSIS — M25662 Stiffness of left knee, not elsewhere classified: Secondary | ICD-10-CM | POA: Diagnosis not present

## 2014-07-08 DIAGNOSIS — M25462 Effusion, left knee: Secondary | ICD-10-CM | POA: Diagnosis not present

## 2014-07-12 DIAGNOSIS — M25562 Pain in left knee: Secondary | ICD-10-CM | POA: Diagnosis not present

## 2014-07-12 DIAGNOSIS — M25462 Effusion, left knee: Secondary | ICD-10-CM | POA: Diagnosis not present

## 2014-07-12 DIAGNOSIS — M25662 Stiffness of left knee, not elsewhere classified: Secondary | ICD-10-CM | POA: Diagnosis not present

## 2014-07-12 DIAGNOSIS — R262 Difficulty in walking, not elsewhere classified: Secondary | ICD-10-CM | POA: Diagnosis not present

## 2014-07-19 DIAGNOSIS — R262 Difficulty in walking, not elsewhere classified: Secondary | ICD-10-CM | POA: Diagnosis not present

## 2014-07-19 DIAGNOSIS — M25562 Pain in left knee: Secondary | ICD-10-CM | POA: Diagnosis not present

## 2014-07-19 DIAGNOSIS — M25462 Effusion, left knee: Secondary | ICD-10-CM | POA: Diagnosis not present

## 2014-07-19 DIAGNOSIS — M25662 Stiffness of left knee, not elsewhere classified: Secondary | ICD-10-CM | POA: Diagnosis not present

## 2014-07-26 DIAGNOSIS — M25562 Pain in left knee: Secondary | ICD-10-CM | POA: Diagnosis not present

## 2014-07-26 DIAGNOSIS — M25662 Stiffness of left knee, not elsewhere classified: Secondary | ICD-10-CM | POA: Diagnosis not present

## 2014-07-26 DIAGNOSIS — R262 Difficulty in walking, not elsewhere classified: Secondary | ICD-10-CM | POA: Diagnosis not present

## 2014-07-26 DIAGNOSIS — M25462 Effusion, left knee: Secondary | ICD-10-CM | POA: Diagnosis not present

## 2014-07-29 DIAGNOSIS — M25462 Effusion, left knee: Secondary | ICD-10-CM | POA: Diagnosis not present

## 2014-07-29 DIAGNOSIS — M25562 Pain in left knee: Secondary | ICD-10-CM | POA: Diagnosis not present

## 2014-07-29 DIAGNOSIS — R262 Difficulty in walking, not elsewhere classified: Secondary | ICD-10-CM | POA: Diagnosis not present

## 2014-07-29 DIAGNOSIS — M25662 Stiffness of left knee, not elsewhere classified: Secondary | ICD-10-CM | POA: Diagnosis not present

## 2014-08-04 DIAGNOSIS — Z9889 Other specified postprocedural states: Secondary | ICD-10-CM | POA: Diagnosis not present

## 2014-08-04 DIAGNOSIS — M1712 Unilateral primary osteoarthritis, left knee: Secondary | ICD-10-CM | POA: Diagnosis not present

## 2014-08-04 DIAGNOSIS — S83412D Sprain of medial collateral ligament of left knee, subsequent encounter: Secondary | ICD-10-CM | POA: Diagnosis not present

## 2014-08-09 DIAGNOSIS — Z0000 Encounter for general adult medical examination without abnormal findings: Secondary | ICD-10-CM | POA: Diagnosis not present

## 2014-08-09 DIAGNOSIS — Z1389 Encounter for screening for other disorder: Secondary | ICD-10-CM | POA: Diagnosis not present

## 2014-08-09 DIAGNOSIS — Z139 Encounter for screening, unspecified: Secondary | ICD-10-CM | POA: Diagnosis not present

## 2014-11-08 DIAGNOSIS — K219 Gastro-esophageal reflux disease without esophagitis: Secondary | ICD-10-CM | POA: Diagnosis not present

## 2014-11-08 DIAGNOSIS — Z6830 Body mass index (BMI) 30.0-30.9, adult: Secondary | ICD-10-CM | POA: Diagnosis not present

## 2014-11-08 DIAGNOSIS — M25562 Pain in left knee: Secondary | ICD-10-CM | POA: Diagnosis not present

## 2015-02-17 DIAGNOSIS — F321 Major depressive disorder, single episode, moderate: Secondary | ICD-10-CM | POA: Diagnosis not present

## 2015-02-17 DIAGNOSIS — R51 Headache: Secondary | ICD-10-CM | POA: Diagnosis not present

## 2015-02-17 DIAGNOSIS — H6120 Impacted cerumen, unspecified ear: Secondary | ICD-10-CM | POA: Diagnosis not present

## 2015-02-17 DIAGNOSIS — G8929 Other chronic pain: Secondary | ICD-10-CM | POA: Diagnosis not present

## 2015-02-17 DIAGNOSIS — L919 Hypertrophic disorder of the skin, unspecified: Secondary | ICD-10-CM | POA: Diagnosis not present

## 2015-07-10 DIAGNOSIS — Z6830 Body mass index (BMI) 30.0-30.9, adult: Secondary | ICD-10-CM | POA: Diagnosis not present

## 2015-07-10 DIAGNOSIS — G8929 Other chronic pain: Secondary | ICD-10-CM | POA: Diagnosis not present

## 2015-07-10 DIAGNOSIS — F39 Unspecified mood [affective] disorder: Secondary | ICD-10-CM | POA: Diagnosis not present

## 2015-07-10 DIAGNOSIS — H9202 Otalgia, left ear: Secondary | ICD-10-CM | POA: Diagnosis not present

## 2015-07-10 DIAGNOSIS — Z1389 Encounter for screening for other disorder: Secondary | ICD-10-CM | POA: Diagnosis not present

## 2015-12-11 DIAGNOSIS — M549 Dorsalgia, unspecified: Secondary | ICD-10-CM | POA: Diagnosis not present

## 2015-12-11 DIAGNOSIS — Z6829 Body mass index (BMI) 29.0-29.9, adult: Secondary | ICD-10-CM | POA: Diagnosis not present

## 2015-12-11 DIAGNOSIS — F329 Major depressive disorder, single episode, unspecified: Secondary | ICD-10-CM | POA: Diagnosis not present

## 2015-12-11 DIAGNOSIS — G8929 Other chronic pain: Secondary | ICD-10-CM | POA: Diagnosis not present

## 2015-12-20 DIAGNOSIS — F329 Major depressive disorder, single episode, unspecified: Secondary | ICD-10-CM | POA: Diagnosis not present

## 2015-12-20 DIAGNOSIS — R634 Abnormal weight loss: Secondary | ICD-10-CM | POA: Diagnosis not present

## 2015-12-20 DIAGNOSIS — Z6827 Body mass index (BMI) 27.0-27.9, adult: Secondary | ICD-10-CM | POA: Diagnosis not present

## 2016-01-03 DIAGNOSIS — Z6828 Body mass index (BMI) 28.0-28.9, adult: Secondary | ICD-10-CM | POA: Diagnosis not present

## 2016-01-03 DIAGNOSIS — Z139 Encounter for screening, unspecified: Secondary | ICD-10-CM | POA: Diagnosis not present

## 2016-01-03 DIAGNOSIS — Z0000 Encounter for general adult medical examination without abnormal findings: Secondary | ICD-10-CM | POA: Diagnosis not present

## 2016-01-03 DIAGNOSIS — Z9181 History of falling: Secondary | ICD-10-CM | POA: Diagnosis not present

## 2016-01-03 DIAGNOSIS — F321 Major depressive disorder, single episode, moderate: Secondary | ICD-10-CM | POA: Diagnosis not present

## 2016-01-03 DIAGNOSIS — Z1389 Encounter for screening for other disorder: Secondary | ICD-10-CM | POA: Diagnosis not present

## 2016-01-29 DIAGNOSIS — R0789 Other chest pain: Secondary | ICD-10-CM | POA: Diagnosis not present

## 2016-01-29 DIAGNOSIS — R1011 Right upper quadrant pain: Secondary | ICD-10-CM | POA: Diagnosis not present

## 2016-01-29 DIAGNOSIS — I700 Atherosclerosis of aorta: Secondary | ICD-10-CM | POA: Diagnosis not present

## 2016-01-29 DIAGNOSIS — K760 Fatty (change of) liver, not elsewhere classified: Secondary | ICD-10-CM | POA: Diagnosis not present

## 2016-08-16 DIAGNOSIS — R072 Precordial pain: Secondary | ICD-10-CM | POA: Diagnosis not present

## 2016-08-16 DIAGNOSIS — Z79899 Other long term (current) drug therapy: Secondary | ICD-10-CM | POA: Diagnosis not present

## 2016-08-16 DIAGNOSIS — M79602 Pain in left arm: Secondary | ICD-10-CM | POA: Diagnosis not present

## 2016-08-16 DIAGNOSIS — R0602 Shortness of breath: Secondary | ICD-10-CM | POA: Diagnosis not present

## 2016-08-16 DIAGNOSIS — R55 Syncope and collapse: Secondary | ICD-10-CM | POA: Diagnosis not present

## 2016-08-16 DIAGNOSIS — Z800 Family history of malignant neoplasm of digestive organs: Secondary | ICD-10-CM | POA: Diagnosis not present

## 2016-08-16 DIAGNOSIS — R079 Chest pain, unspecified: Secondary | ICD-10-CM | POA: Diagnosis not present

## 2016-08-16 DIAGNOSIS — K219 Gastro-esophageal reflux disease without esophagitis: Secondary | ICD-10-CM | POA: Diagnosis not present

## 2016-08-16 DIAGNOSIS — R51 Headache: Secondary | ICD-10-CM | POA: Diagnosis not present

## 2016-08-17 DIAGNOSIS — R55 Syncope and collapse: Secondary | ICD-10-CM | POA: Diagnosis not present

## 2016-08-17 DIAGNOSIS — K219 Gastro-esophageal reflux disease without esophagitis: Secondary | ICD-10-CM | POA: Diagnosis not present

## 2016-08-17 DIAGNOSIS — R079 Chest pain, unspecified: Secondary | ICD-10-CM | POA: Diagnosis not present

## 2016-08-22 DIAGNOSIS — Z139 Encounter for screening, unspecified: Secondary | ICD-10-CM | POA: Diagnosis not present

## 2016-08-22 DIAGNOSIS — R0789 Other chest pain: Secondary | ICD-10-CM | POA: Diagnosis not present

## 2017-02-25 HISTORY — PX: OTHER SURGICAL HISTORY: SHX169

## 2017-08-12 DIAGNOSIS — F322 Major depressive disorder, single episode, severe without psychotic features: Secondary | ICD-10-CM | POA: Diagnosis not present

## 2017-09-03 DIAGNOSIS — K219 Gastro-esophageal reflux disease without esophagitis: Secondary | ICD-10-CM | POA: Diagnosis not present

## 2017-09-03 DIAGNOSIS — Z6827 Body mass index (BMI) 27.0-27.9, adult: Secondary | ICD-10-CM | POA: Diagnosis not present

## 2017-09-05 DIAGNOSIS — K869 Disease of pancreas, unspecified: Secondary | ICD-10-CM | POA: Diagnosis not present

## 2017-09-05 DIAGNOSIS — R1032 Left lower quadrant pain: Secondary | ICD-10-CM | POA: Diagnosis not present

## 2017-09-06 ENCOUNTER — Emergency Department (HOSPITAL_BASED_OUTPATIENT_CLINIC_OR_DEPARTMENT_OTHER)
Admission: EM | Admit: 2017-09-06 | Discharge: 2017-09-06 | Disposition: A | Discharge status: Home / Self Care | Payer: Medicare Other | Attending: Emergency Medicine | Admitting: Emergency Medicine

## 2017-09-06 ENCOUNTER — Other Ambulatory Visit: Payer: Self-pay

## 2017-09-06 ENCOUNTER — Encounter (HOSPITAL_BASED_OUTPATIENT_CLINIC_OR_DEPARTMENT_OTHER): Payer: Self-pay | Admitting: Emergency Medicine

## 2017-09-06 DIAGNOSIS — R1013 Epigastric pain: Secondary | ICD-10-CM | POA: Insufficient documentation

## 2017-09-06 DIAGNOSIS — Z79899 Other long term (current) drug therapy: Secondary | ICD-10-CM | POA: Insufficient documentation

## 2017-09-06 DIAGNOSIS — R1032 Left lower quadrant pain: Secondary | ICD-10-CM | POA: Diagnosis not present

## 2017-09-06 DIAGNOSIS — R112 Nausea with vomiting, unspecified: Secondary | ICD-10-CM

## 2017-09-06 DIAGNOSIS — R197 Diarrhea, unspecified: Secondary | ICD-10-CM | POA: Diagnosis not present

## 2017-09-06 DIAGNOSIS — K8689 Other specified diseases of pancreas: Secondary | ICD-10-CM

## 2017-09-06 DIAGNOSIS — K869 Disease of pancreas, unspecified: Secondary | ICD-10-CM | POA: Diagnosis not present

## 2017-09-06 DIAGNOSIS — R1012 Left upper quadrant pain: Secondary | ICD-10-CM | POA: Diagnosis not present

## 2017-09-06 LAB — COMPREHENSIVE METABOLIC PANEL
ALK PHOS: 77 U/L (ref 38–126)
ALT: 45 U/L — ABNORMAL HIGH (ref 0–44)
ANION GAP: 8 (ref 5–15)
AST: 23 U/L (ref 15–41)
Albumin: 4.4 g/dL (ref 3.5–5.0)
BILIRUBIN TOTAL: 0.8 mg/dL (ref 0.3–1.2)
BUN: 10 mg/dL (ref 6–20)
CALCIUM: 8.8 mg/dL — AB (ref 8.9–10.3)
CO2: 24 mmol/L (ref 22–32)
Chloride: 105 mmol/L (ref 98–111)
Creatinine, Ser: 0.95 mg/dL (ref 0.61–1.24)
GFR calc Af Amer: >60 mL/min (ref >60)
Glucose, Bld: 89 mg/dL (ref 70–99)
POTASSIUM: 3.6 mmol/L (ref 3.5–5.1)
Sodium: 137 mmol/L (ref 135–145)
TOTAL PROTEIN: 7.6 g/dL (ref 6.5–8.1)

## 2017-09-06 LAB — CBC WITH DIFFERENTIAL/PLATELET
BASOS ABS: 0 10*3/uL (ref 0.0–0.1)
BASOS PCT: 0 %
EOS ABS: 0.2 10*3/uL (ref 0.0–0.7)
Eosinophils Relative: 2 %
HEMATOCRIT: 42 % (ref 39.0–52.0)
HEMOGLOBIN: 14.9 g/dL (ref 13.0–17.0)
Lymphocytes Relative: 30 %
Lymphs Abs: 2.4 10*3/uL (ref 0.7–4.0)
MCH: 32.7 pg (ref 26.0–34.0)
MCHC: 35.5 g/dL (ref 30.0–36.0)
MCV: 92.1 fL (ref 78.0–100.0)
MONO ABS: 0.7 10*3/uL (ref 0.1–1.0)
Monocytes Relative: 9 %
NEUTROS ABS: 4.7 10*3/uL (ref 1.7–7.7)
NEUTROS PCT: 59 %
Platelets: 259 10*3/uL (ref 150–400)
RBC: 4.56 MIL/uL (ref 4.22–5.81)
RDW: 12.3 % (ref 11.5–15.5)
WBC: 7.9 10*3/uL (ref 4.0–10.5)

## 2017-09-06 LAB — URINALYSIS, ROUTINE W REFLEX MICROSCOPIC
Glucose, UA: NEGATIVE mg/dL
Hgb urine dipstick: NEGATIVE
KETONES UR: NEGATIVE mg/dL
LEUKOCYTES UA: NEGATIVE
Nitrite: NEGATIVE
PH: 6 (ref 5.0–8.0)
PROTEIN: NEGATIVE mg/dL
Specific Gravity, Urine: 1.025 (ref 1.005–1.030)

## 2017-09-06 LAB — LIPASE, BLOOD: LIPASE: 39 U/L (ref 11–51)

## 2017-09-06 MED ORDER — ONDANSETRON 8 MG PO TBDP: 8.0000 mg | ORAL_TABLET | Freq: Three times a day (TID) | ORAL | 0 refills | Status: DC | PRN

## 2017-09-06 MED ORDER — SUCRALFATE 1 GM/10ML PO SUSP
1.0000 g | Freq: Once | ORAL | Status: AC
  Administered 2017-09-06: 1 g via ORAL
  Filled 2017-09-06: qty 10

## 2017-09-06 MED ORDER — ONDANSETRON HCL 4 MG/2ML IJ SOLN
4.0000 mg | Freq: Once | INTRAMUSCULAR | Status: AC
  Administered 2017-09-06: 4 mg via INTRAVENOUS
  Filled 2017-09-06: qty 2

## 2017-09-06 MED ORDER — FENTANYL CITRATE (PF) 100 MCG/2ML IJ SOLN
100.0000 ug | Freq: Once | INTRAMUSCULAR | Status: AC
  Administered 2017-09-06: 100 ug via INTRAVENOUS
  Filled 2017-09-06: qty 2

## 2017-09-06 MED ORDER — OXYCODONE-ACETAMINOPHEN 5-325 MG PO TABS: 1.0000 | ORAL_TABLET | Freq: Four times a day (QID) | ORAL | 0 refills | Status: DC | PRN

## 2017-09-06 MED ORDER — SODIUM CHLORIDE 0.9 % IV BOLUS
1000.0000 mL | Freq: Once | INTRAVENOUS | Status: AC
  Administered 2017-09-06: 1000 mL via INTRAVENOUS

## 2017-09-06 NOTE — ED Triage Notes (Signed)
Patient co abd pain x 7 days with NV and increased weight loss. States seen at [facility] ED this week for same. NAD noted.

## 2017-09-06 NOTE — ED Provider Notes (Signed)
[facility] Provider Note: Georgena Spurling, MD, FACEP  CSN: [id number] MRN: [id number] ARRIVAL: 09/06/17 at [place]: [place]  Abdominal Pain   HISTORY OF PRESENT ILLNESS  09/06/17 3:33 AM Andre Simmons is a 42 y.o. male who complains of a 2-week history of nausea, vomiting and diarrhea.  He has had about a 6-day history of epigastric pain which occurs after he eats, and left lower quadrant pain which is fairly constant.  The pain in the left lower quadrant is worse with movement or palpation.  Pain is moderate to severe and dull in nature.  He states he has had a weight loss recently because of poor intake.  He states he was seen at [facility] yesterday for the same.  A CT scan there showed a mass of the pancreatic tail which was unchanged since a CT in 2017.  He states he was given a prescription for 5 hydrocodone tablets which he has not had filled.  Consultation with the [facility] state controlled substances database reveals the patient has received 1 prescription for oxycodone in October 2017.   Past Medical History:  Diagnosis Date  . Chronic back pain   . Knee pain     Past Surgical History:  Procedure Laterality Date  . BACK SURGERY    . KNEE SURGERY      History reviewed. No pertinent family history.  Social History   Tobacco Use  . Smoking status: Never Smoker  . Smokeless tobacco: Never Used  Substance Use Topics  . Alcohol use: No  . Drug use: No    Prior to Admission medications   Medication Sig Start Date End Date Taking? Authorizing Provider  Sertraline HCl (ZOLOFT PO) Take by mouth.   Yes [provider]  omeprazole (PRILOSEC) 40 MG capsule Take 40 mg by mouth daily.    [provider]  oxyCODONE-acetaminophen (PERCOCET/ROXICET) 5-325 MG per tablet Take 2 tablets by mouth every 4 (four) hours as needed for pain. 11/07/12   Fransico Meadow, PA-C  predniSONE (DELTASONE) 10 MG tablet  6,5,4,3,2,1  taper 11/07/12   Fransico Meadow, PA-C    Allergies Patient has no known allergies.   REVIEW OF SYSTEMS  Negative except as noted here or in the History of Present Illness.   PHYSICAL EXAMINATION  Initial Vital Signs Blood pressure (!) 144/100, pulse 67, temperature 98.3 F (36.8 C), temperature source Oral, resp. rate 16, height 6\' 3"  (1.905 m), weight 99.3 kg (218 lb 14.7 oz), SpO2 99 %.  Examination General: Well-developed, well-nourished male in no acute distress; appearance consistent with age of record HENT: normocephalic; atraumatic Eyes: pupils equal, round and reactive to light; extraocular muscles intact Neck: supple Heart: regular rate and rhythm Lungs: clear to auscultation bilaterally Abdomen: soft; nondistended; epigastric, left upper quadrant and left lower quadrant tenderness; no masses or hepatosplenomegaly; bowel sounds present Extremities: No deformity; full range of motion; pulses normal Neurologic: Awake, alert and oriented; motor function intact in all extremities and symmetric; no facial droop Skin: Warm and dry Psychiatric: Flat affect   RESULTS  Summary of this visit's results, reviewed by myself:   EKG Interpretation  Date/Time:    Ventricular Rate:    PR Interval:    QRS Duration:   QT Interval:    QTC Calculation:   R Axis:     Text Interpretation:        Laboratory Studies: Results for orders placed or performed during the  hospital encounter of 09/06/17 (from the past 24 hour(s))  Comprehensive metabolic panel     Status: Abnormal   Collection Time: 09/06/17  4:01 AM  Result Value Ref Range   Sodium 137 135 - 145 mmol/L   Potassium 3.6 3.5 - 5.1 mmol/L   Chloride 105 98 - 111 mmol/L   CO2 24 22 - 32 mmol/L   Glucose, Bld 89 70 - 99 mg/dL   BUN 10 6 - 20 mg/dL   Creatinine, Ser 0.95 0.61 - 1.24 mg/dL   Calcium 8.8 (L) 8.9 - 10.3 mg/dL   Total Protein 7.6 6.5 - 8.1 g/dL   Albumin 4.4 3.5 - 5.0 g/dL   AST 23 15 - 41  U/L   ALT 45 (H) 0 - 44 U/L   Alkaline Phosphatase 77 38 - 126 U/L   Total Bilirubin 0.8 0.3 - 1.2 mg/dL   GFR calc non Af Amer >60 >60 mL/min   GFR calc Af Amer >60 >60 mL/min   Anion gap 8 5 - 15  Lipase, blood     Status: None   Collection Time: 09/06/17  4:01 AM  Result Value Ref Range   Lipase 39 11 - 51 U/L  CBC with Differential/Platelet     Status: None   Collection Time: 09/06/17  4:01 AM  Result Value Ref Range   WBC 7.9 4.0 - 10.5 K/uL   RBC 4.56 4.22 - 5.81 MIL/uL   Hemoglobin 14.9 13.0 - 17.0 g/dL   HCT 42.0 39.0 - 52.0 %   MCV 92.1 78.0 - 100.0 fL   MCH 32.7 26.0 - 34.0 pg   MCHC 35.5 30.0 - 36.0 g/dL   RDW 12.3 11.5 - 15.5 %   Platelets 259 150 - 400 K/uL   Neutrophils Relative % 59 %   Neutro Abs 4.7 1.7 - 7.7 K/uL   Lymphocytes Relative 30 %   Lymphs Abs 2.4 0.7 - 4.0 K/uL   Monocytes Relative 9 %   Monocytes Absolute 0.7 0.1 - 1.0 K/uL   Eosinophils Relative 2 %   Eosinophils Absolute 0.2 0.0 - 0.7 K/uL   Basophils Relative 0 %   Basophils Absolute 0.0 0.0 - 0.1 K/uL  Urinalysis, Routine w reflex microscopic     Status: Abnormal   Collection Time: 09/06/17  5:19 AM  Result Value Ref Range   Color, Urine YELLOW YELLOW   APPearance CLEAR CLEAR   Specific Gravity, Urine 1.025 1.005 - 1.030   pH 6.0 5.0 - 8.0   Glucose, UA NEGATIVE NEGATIVE mg/dL   Hgb urine dipstick NEGATIVE NEGATIVE   Bilirubin Urine SMALL (A) NEGATIVE   Ketones, ur NEGATIVE NEGATIVE mg/dL   Protein, ur NEGATIVE NEGATIVE mg/dL   Nitrite NEGATIVE NEGATIVE   Leukocytes, UA NEGATIVE NEGATIVE   Imaging Studies: No results found.  ED COURSE and MDM  Nursing notes and initial vitals signs, including pulse oximetry, reviewed.  Vitals:   09/06/17 0325 09/06/17 0326 09/06/17 0531  BP: (!) 144/100  110/82  Pulse: 67  (!) 55  Resp: 16  16  Temp: 98.3 F (36.8 C)  98.4 F (36.9 C)  TempSrc: Oral  Oral  SpO2: 99%  100%  Weight:  99.3 kg (218 lb 14.7 oz)   Height:  6\' 3"  (1.905 m)      5:31 AM Significant improvement with pain and nausea after IV fentanyl and Zofran.  No significant change with oral Carafate.  Patient is already on omeprazole.  We will  prescribe Zofran and a short course of analgesics and refer him to his primary care physician.  He was advised he will likely need an MRI to further characterize pancreatic mass.  It is not definitely cancer based on CT findings but we cannot rule out the possibility of a malignancy.  PROCEDURES    ED DIAGNOSES     ICD-10-CM   1. Mass of pancreas K86.9   2. Left upper quadrant pain R10.12   3. Left lower quadrant pain R10.32   4. Nausea vomiting and diarrhea R11.2    R19.7        Galilee Pierron, Jenny Reichmann, MD 09/06/17 [phone]

## 2017-09-09 ENCOUNTER — Encounter: Payer: Self-pay | Admitting: Gastroenterology

## 2017-09-09 DIAGNOSIS — K869 Disease of pancreas, unspecified: Secondary | ICD-10-CM | POA: Diagnosis not present

## 2017-09-09 DIAGNOSIS — Z6827 Body mass index (BMI) 27.0-27.9, adult: Secondary | ICD-10-CM | POA: Diagnosis not present

## 2017-09-09 DIAGNOSIS — E663 Overweight: Secondary | ICD-10-CM | POA: Diagnosis not present

## 2017-09-11 DIAGNOSIS — R634 Abnormal weight loss: Secondary | ICD-10-CM | POA: Diagnosis not present

## 2017-09-11 DIAGNOSIS — K869 Disease of pancreas, unspecified: Secondary | ICD-10-CM | POA: Diagnosis not present

## 2017-09-11 DIAGNOSIS — K760 Fatty (change of) liver, not elsewhere classified: Secondary | ICD-10-CM | POA: Diagnosis not present

## 2017-09-11 DIAGNOSIS — K8689 Other specified diseases of pancreas: Secondary | ICD-10-CM | POA: Diagnosis not present

## 2017-09-16 ENCOUNTER — Telehealth: Payer: Self-pay | Admitting: Gastroenterology

## 2017-09-16 DIAGNOSIS — K869 Disease of pancreas, unspecified: Secondary | ICD-10-CM | POA: Diagnosis not present

## 2017-09-16 DIAGNOSIS — K219 Gastro-esophageal reflux disease without esophagitis: Secondary | ICD-10-CM | POA: Diagnosis not present

## 2017-09-16 DIAGNOSIS — E663 Overweight: Secondary | ICD-10-CM | POA: Diagnosis not present

## 2017-09-16 DIAGNOSIS — Z6827 Body mass index (BMI) 27.0-27.9, adult: Secondary | ICD-10-CM | POA: Diagnosis not present

## 2017-09-16 NOTE — Telephone Encounter (Signed)
Patient has been scheduled for 09/18/17 with Janett Billow appointment was made with Larene Beach at [facility].

## 2017-09-16 NOTE — Telephone Encounter (Signed)
He can see an APP this week if there are openings.  He has an appointment with Dr Lyndel Safe in August.  Please let me know when you schedule him.  Thank you.

## 2017-09-18 ENCOUNTER — Encounter: Payer: Self-pay | Admitting: Gastroenterology

## 2017-09-18 ENCOUNTER — Ambulatory Visit (INDEPENDENT_AMBULATORY_CARE_PROVIDER_SITE_OTHER): Payer: Medicare Other | Admitting: Gastroenterology

## 2017-09-18 VITALS — BP 120/70 | HR 83 | Ht 75.0 in | Wt 221.0 lb

## 2017-09-18 DIAGNOSIS — R9389 Abnormal findings on diagnostic imaging of other specified body structures: Secondary | ICD-10-CM | POA: Diagnosis not present

## 2017-09-18 DIAGNOSIS — K869 Disease of pancreas, unspecified: Secondary | ICD-10-CM | POA: Insufficient documentation

## 2017-09-18 MED ORDER — PROMETHAZINE HCL 12.5 MG PO TABS: 12.5000 mg | ORAL_TABLET | Freq: Three times a day (TID) | ORAL | 0 refills | Status: DC | PRN

## 2017-09-18 NOTE — Progress Notes (Signed)
     09/18/2017 Andre Simmons [id number] 1975-03-17   HISTORY OF PRESENT ILLNESS:  This is a 42 year old male who was referred here by his PCP, Dr. Nyra Capes, for evaluation of an abnormal MRI showing a pancreatic mass in the tail of the pancreas.  MRI states predominantly solid lesion in the tail of the pancreas relatively stable in size compared to CT scan 01/2016.  Mild enhancement with some central calcifications and minimal cystic change.  Differential considerations include nonfunctioning neuroendocrine tumor, atypical microcystic adenoma, or rare mesenchymal pancreatic mass, favor a neuroendocrine tumor.  Patient now reporting 36 pound weight loss in 1.5-2 months, nausea, poor appetite, early satiety, which is what prompted his evaluation.   Past Medical History:  Diagnosis Date  . Chronic back pain   . GERD (gastroesophageal reflux disease)   . Knee pain    Past Surgical History:  Procedure Laterality Date  . BACK SURGERY     L4-L5, S1 micro discectomy  . KNEE SURGERY Left    x 20 surgeries  . MOUTH SURGERY     as a child, unsure if wisdom teeth or tonsils    reports that he has never smoked. He has never used smokeless tobacco. He reports that he does not drink alcohol or use drugs. family history includes Breast cancer in his maternal grandmother; Esophageal cancer in his maternal grandmother and paternal grandmother; Other in his father; Thyroid cancer in his mother. No Known Allergies    Outpatient Encounter Medications as of 09/18/2017  Medication Sig  . omeprazole (PRILOSEC) 40 MG capsule Take 40 mg by mouth 2 (two) times daily.   . ondansetron (ZOFRAN ODT) 8 MG disintegrating tablet Take 1 tablet (8 mg total) by mouth every 8 (eight) hours as needed for nausea or vomiting.  Marland Kitchen oxyCODONE-acetaminophen (PERCOCET) 5-325 MG tablet Take 1 tablet by mouth every 6 (six) hours as needed for severe pain.  Marland Kitchen sertraline (ZOLOFT) 50 MG tablet Take 50 mg by mouth daily.  .  [DISCONTINUED] Sertraline HCl (ZOLOFT PO) Take by mouth.   No facility-administered encounter medications on file as of 09/18/2017.      REVIEW OF SYSTEMS  : All other systems reviewed and negative except where noted in the History of Present Illness.   PHYSICAL EXAM: BP 120/70   Pulse 83   Ht 6\' 3"  (1.905 m)   Wt 221 lb (100.2 kg)   BMI 27.62 kg/m  General: Well developed white male in no acute distress Head: Normocephalic and atraumatic Eyes:  Sclerae anicteric, conjunctiva pink. Ears: Normal auditory acuity Lungs: Clear throughout to auscultation; no increased WOB. Heart: Regular rate and rhythm; no M/R/G. Abdomen: Soft, non-distended.  BS present.  Mild LUQ TTP. Musculoskeletal: Symmetrical with no gross deformities  Skin: No lesions on visible extremities Extremities: No edema  Neurological: Alert oriented x 4, grossly non-focal Psychological:  Alert and cooperative. Normal mood and affect  ASSESSMENT AND PLAN: *42 year old male with abnormal MRI showing a pancreatic mass in the tail of the pancreas.  Quite stable since 01/2016 but patient now reporting 36 pound weight loss in 1.5-2 months, nausea, poor appetite, early satiety.  Will schedule for EUS with Dr. Ardis Hughs.  **The risks, benefits, and alternatives to EUS were discussed with the patient and he consents to proceed.   CC:  Maryella Shivers, MD

## 2017-09-18 NOTE — Patient Instructions (Signed)
We have sent the following medications to your pharmacy for you to pick up at your convenience:  Phenergan 12.5 mg every 8 hours as needed

## 2017-09-18 NOTE — H&P (View-Only) (Signed)
     09/18/2017 Andre Simmons [id number] Apr 03, 1975   HISTORY OF PRESENT ILLNESS:  This is a 42 year old male who was referred here by his PCP, Dr. Nyra Capes, for evaluation of an abnormal MRI showing a pancreatic mass in the tail of the pancreas.  MRI states predominantly solid lesion in the tail of the pancreas relatively stable in size compared to CT scan 01/2016.  Mild enhancement with some central calcifications and minimal cystic change.  Differential considerations include nonfunctioning neuroendocrine tumor, atypical microcystic adenoma, or rare mesenchymal pancreatic mass, favor a neuroendocrine tumor.  Patient now reporting 36 pound weight loss in 1.5-2 months, nausea, poor appetite, early satiety, which is what prompted his evaluation.   Past Medical History:  Diagnosis Date  . Chronic back pain   . GERD (gastroesophageal reflux disease)   . Knee pain    Past Surgical History:  Procedure Laterality Date  . BACK SURGERY     L4-L5, S1 micro discectomy  . KNEE SURGERY Left    x 20 surgeries  . MOUTH SURGERY     as a child, unsure if wisdom teeth or tonsils    reports that he has never smoked. He has never used smokeless tobacco. He reports that he does not drink alcohol or use drugs. family history includes Breast cancer in his maternal grandmother; Esophageal cancer in his maternal grandmother and paternal grandmother; Other in his father; Thyroid cancer in his mother. No Known Allergies    Outpatient Encounter Medications as of 09/18/2017  Medication Sig  . omeprazole (PRILOSEC) 40 MG capsule Take 40 mg by mouth 2 (two) times daily.   . ondansetron (ZOFRAN ODT) 8 MG disintegrating tablet Take 1 tablet (8 mg total) by mouth every 8 (eight) hours as needed for nausea or vomiting.  Marland Kitchen oxyCODONE-acetaminophen (PERCOCET) 5-325 MG tablet Take 1 tablet by mouth every 6 (six) hours as needed for severe pain.  Marland Kitchen sertraline (ZOLOFT) 50 MG tablet Take 50 mg by mouth daily.  .  [DISCONTINUED] Sertraline HCl (ZOLOFT PO) Take by mouth.   No facility-administered encounter medications on file as of 09/18/2017.      REVIEW OF SYSTEMS  : All other systems reviewed and negative except where noted in the History of Present Illness.   PHYSICAL EXAM: BP 120/70   Pulse 83   Ht 6\' 3"  (1.905 m)   Wt 221 lb (100.2 kg)   BMI 27.62 kg/m  General: Well developed white male in no acute distress Head: Normocephalic and atraumatic Eyes:  Sclerae anicteric, conjunctiva pink. Ears: Normal auditory acuity Lungs: Clear throughout to auscultation; no increased WOB. Heart: Regular rate and rhythm; no M/R/G. Abdomen: Soft, non-distended.  BS present.  Mild LUQ TTP. Musculoskeletal: Symmetrical with no gross deformities  Skin: No lesions on visible extremities Extremities: No edema  Neurological: Alert oriented x 4, grossly non-focal Psychological:  Alert and cooperative. Normal mood and affect  ASSESSMENT AND PLAN: *42 year old male with abnormal MRI showing a pancreatic mass in the tail of the pancreas.  Quite stable since 01/2016 but patient now reporting 36 pound weight loss in 1.5-2 months, nausea, poor appetite, early satiety.  Will schedule for EUS with Dr. Ardis Hughs.  **The risks, benefits, and alternatives to EUS were discussed with the patient and he consents to proceed.   CC:  Maryella Shivers, MD

## 2017-09-19 NOTE — Progress Notes (Signed)
I agree with the above note, plan.  For next available EUS with MAC sedation.

## 2017-09-25 ENCOUNTER — Ambulatory Visit (HOSPITAL_COMMUNITY): Payer: Medicare Other | Admitting: Anesthesiology

## 2017-09-25 ENCOUNTER — Telehealth: Payer: Self-pay

## 2017-09-25 ENCOUNTER — Encounter (HOSPITAL_COMMUNITY)
Admission: RE | Disposition: A | Discharge status: Home / Self Care | Payer: Self-pay | Source: Ambulatory Visit | Attending: Gastroenterology

## 2017-09-25 ENCOUNTER — Ambulatory Visit (HOSPITAL_BASED_OUTPATIENT_CLINIC_OR_DEPARTMENT_OTHER)
Admission: RE | Admit: 2017-09-25 | Discharge: 2017-09-25 | Disposition: A | Discharge status: Home / Self Care | Payer: Medicare Other | Source: Ambulatory Visit | Attending: Gastroenterology | Admitting: Gastroenterology

## 2017-09-25 ENCOUNTER — Encounter (HOSPITAL_COMMUNITY): Payer: Self-pay

## 2017-09-25 DIAGNOSIS — K8689 Other specified diseases of pancreas: Secondary | ICD-10-CM

## 2017-09-25 DIAGNOSIS — K219 Gastro-esophageal reflux disease without esophagitis: Secondary | ICD-10-CM

## 2017-09-25 DIAGNOSIS — Z79899 Other long term (current) drug therapy: Secondary | ICD-10-CM

## 2017-09-25 DIAGNOSIS — R634 Abnormal weight loss: Secondary | ICD-10-CM | POA: Insufficient documentation

## 2017-09-25 DIAGNOSIS — R1012 Left upper quadrant pain: Secondary | ICD-10-CM | POA: Diagnosis not present

## 2017-09-25 DIAGNOSIS — K869 Disease of pancreas, unspecified: Secondary | ICD-10-CM | POA: Diagnosis not present

## 2017-09-25 DIAGNOSIS — R9389 Abnormal findings on diagnostic imaging of other specified body structures: Secondary | ICD-10-CM

## 2017-09-25 DIAGNOSIS — R630 Anorexia: Secondary | ICD-10-CM | POA: Insufficient documentation

## 2017-09-25 DIAGNOSIS — F329 Major depressive disorder, single episode, unspecified: Secondary | ICD-10-CM | POA: Insufficient documentation

## 2017-09-25 DIAGNOSIS — Z6827 Body mass index (BMI) 27.0-27.9, adult: Secondary | ICD-10-CM

## 2017-09-25 DIAGNOSIS — M549 Dorsalgia, unspecified: Secondary | ICD-10-CM | POA: Insufficient documentation

## 2017-09-25 DIAGNOSIS — Z808 Family history of malignant neoplasm of other organs or systems: Secondary | ICD-10-CM | POA: Diagnosis not present

## 2017-09-25 DIAGNOSIS — M25569 Pain in unspecified knee: Secondary | ICD-10-CM | POA: Insufficient documentation

## 2017-09-25 DIAGNOSIS — K858 Other acute pancreatitis without necrosis or infection: Secondary | ICD-10-CM | POA: Diagnosis not present

## 2017-09-25 DIAGNOSIS — G8929 Other chronic pain: Secondary | ICD-10-CM | POA: Insufficient documentation

## 2017-09-25 DIAGNOSIS — E876 Hypokalemia: Secondary | ICD-10-CM | POA: Diagnosis not present

## 2017-09-25 DIAGNOSIS — R933 Abnormal findings on diagnostic imaging of other parts of digestive tract: Secondary | ICD-10-CM | POA: Diagnosis not present

## 2017-09-25 DIAGNOSIS — K861 Other chronic pancreatitis: Secondary | ICD-10-CM | POA: Diagnosis not present

## 2017-09-25 DIAGNOSIS — R112 Nausea with vomiting, unspecified: Secondary | ICD-10-CM | POA: Diagnosis not present

## 2017-09-25 HISTORY — PX: EUS: SHX5427

## 2017-09-25 HISTORY — PX: FINE NEEDLE ASPIRATION: SHX5430

## 2017-09-25 SURGERY — UPPER ENDOSCOPIC ULTRASOUND (EUS) LINEAR
Anesthesia: Monitor Anesthesia Care

## 2017-09-25 MED ORDER — PROPOFOL 500 MG/50ML IV EMUL
INTRAVENOUS | Status: DC | PRN
  Administered 2017-09-25: 300 ug/kg/min via INTRAVENOUS

## 2017-09-25 MED ORDER — LACTATED RINGERS IV SOLN
INTRAVENOUS | Status: DC
  Administered 2017-09-25 (×2): via INTRAVENOUS

## 2017-09-25 MED ORDER — SODIUM CHLORIDE 0.9 % IV SOLN: INTRAVENOUS | Status: DC

## 2017-09-25 MED ORDER — PROPOFOL 10 MG/ML IV BOLUS
INTRAVENOUS | Status: AC
Start: 2017-09-25 — End: ?
  Filled 2017-09-25: qty 20

## 2017-09-25 MED ORDER — GLYCOPYRROLATE 0.2 MG/ML IJ SOLN
INTRAMUSCULAR | Status: DC | PRN
  Administered 2017-09-25: 0.1 mg via INTRAVENOUS

## 2017-09-25 MED ORDER — LIDOCAINE HCL (CARDIAC) PF 100 MG/5ML IV SOSY
PREFILLED_SYRINGE | INTRAVENOUS | Status: DC | PRN
  Administered 2017-09-25: 100 mg via INTRAVENOUS

## 2017-09-25 MED ORDER — PROPOFOL 10 MG/ML IV BOLUS
INTRAVENOUS | Status: AC
  Filled 2017-09-25: qty 40

## 2017-09-25 NOTE — Transfer of Care (Signed)
Immediate Anesthesia Transfer of Care Note  Patient: Andre Simmons  Procedure(s) Performed: UPPER ENDOSCOPIC ULTRASOUND (EUS) LINEAR (N/A )  Patient Location: PACU  Anesthesia Type:MAC  Level of Consciousness: awake, alert , oriented and patient cooperative  Airway & Oxygen Therapy: Patient Spontanous Breathing and Patient connected to nasal cannula oxygen  Post-op Assessment: Report given to RN, Post -op Vital signs reviewed and stable and Patient moving all extremities X 4  Post vital signs: stable  Last Vitals:  Vitals Value Taken Time  BP 107/73 09/25/2017  2:13 PM  Temp 36.4 C 09/25/2017  2:13 PM  Pulse 55 09/25/2017  2:16 PM  Resp 13 09/25/2017  2:16 PM  SpO2 100 % 09/25/2017  2:16 PM  Vitals shown include unvalidated device data.  Last Pain:  Vitals:   09/25/17 1413  TempSrc: Oral  PainSc:          Complications: No apparent anesthesia complications

## 2017-09-25 NOTE — Discharge Instructions (Signed)
YOU HAD AN ENDOSCOPIC PROCEDURE TODAY: Refer to the procedure report and other information in the discharge instructions given to you for any specific questions about what was found during the examination. If this information does not answer your questions, please call [place] office at [phone] to clarify.  ° °YOU SHOULD EXPECT: Some feelings of bloating in the abdomen. Passage of more gas than usual. Walking can help get rid of the air that was put into your GI tract during the procedure and reduce the bloating. If you had a lower endoscopy (such as a colonoscopy or flexible sigmoidoscopy) you may notice spotting of blood in your stool or on the toilet paper. Some abdominal soreness may be present for a day or two, also. ° °DIET: Your first meal following the procedure should be a light meal and then it is ok to progress to your normal diet. A half-sandwich or bowl of soup is an example of a good first meal. Heavy or fried foods are harder to digest and may make you feel nauseous or bloated. Drink plenty of fluids but you should avoid alcoholic beverages for 24 hours. If you had a esophageal dilation, please see attached instructions for diet.   ° °ACTIVITY: Your care partner should take you home directly after the procedure. You should plan to take it easy, moving slowly for the rest of the day. You can resume normal activity the day after the procedure however YOU SHOULD NOT DRIVE, use power tools, machinery or perform tasks that involve climbing or major physical exertion for 24 hours (because of the sedation medicines used during the test).  ° °SYMPTOMS TO REPORT IMMEDIATELY: °A gastroenterologist can be reached at any hour. Please call [phone]  for any of the following symptoms:  °Following lower endoscopy (colonoscopy, flexible sigmoidoscopy) °Excessive amounts of blood in the stool  °Significant tenderness, worsening of abdominal pains  °Swelling of the abdomen that is new, acute  °Fever of 100° or  higher  °Following upper endoscopy (EGD, EUS, ERCP, esophageal dilation) °Vomiting of blood or coffee ground material  °New, significant abdominal pain  °New, significant chest pain or pain under the shoulder blades  °Painful or persistently difficult swallowing  °New shortness of breath  °Black, tarry-looking or red, bloody stools ° °FOLLOW UP:  °If any biopsies were taken you will be contacted by phone or by letter within the next 1-3 weeks. Call [phone]  if you have not heard about the biopsies in 3 weeks.  °Please also call with any specific questions about appointments or follow up tests. ° °

## 2017-09-25 NOTE — Anesthesia Preprocedure Evaluation (Addendum)
Anesthesia Evaluation  Patient identified by MRN, date of birth, ID band Patient awake    Reviewed: Allergy & Precautions, NPO status , Patient's Chart, lab work & pertinent test results  History of Anesthesia Complications Negative for: history of anesthetic complications  Airway Mallampati: II  TM Distance: >3 FB Neck ROM: Full    Dental  (+) Dental Advisory Given, Teeth Intact   Pulmonary neg pulmonary ROS,    breath sounds clear to auscultation       Cardiovascular negative cardio ROS   Rhythm:Regular Rate:Normal     Neuro/Psych Depression negative neurological ROS     GI/Hepatic Neg liver ROS, GERD  Controlled and Medicated, Pancreatic lesion    Endo/Other  negative endocrine ROS  Renal/GU negative Renal ROS  negative genitourinary   Musculoskeletal  Chronic back pain    Abdominal   Peds  Hematology negative hematology ROS (+)   Anesthesia Other Findings   Reproductive/Obstetrics                            Anesthesia Physical Anesthesia Plan  ASA: II  Anesthesia Plan: MAC   Post-op Pain Management:    Induction: Intravenous  PONV Risk Score and Plan: Propofol infusion and Treatment may vary due to age or medical condition  Airway Management Planned: Nasal Cannula and Natural Airway  Additional Equipment: None  Intra-op Plan:   Post-operative Plan:   Informed Consent: I have reviewed the patients History and Physical, chart, labs and discussed the procedure including the risks, benefits and alternatives for the proposed anesthesia with the patient or authorized representative who has indicated his/her understanding and acceptance.     Plan Discussed with: CRNA and Anesthesiologist  Anesthesia Plan Comments:        Anesthesia Quick Evaluation

## 2017-09-25 NOTE — Interval H&P Note (Signed)
History and Physical Interval Note:  09/25/2017 11:49 AM  Andre Simmons  has presented today for surgery, with the diagnosis of pancreatic lesion/db  The various methods of treatment have been discussed with the patient and family. After consideration of risks, benefits and other options for treatment, the patient has consented to  Procedure(s): UPPER ENDOSCOPIC ULTRASOUND (EUS) LINEAR (N/A) as a surgical intervention .  The patient's history has been reviewed, patient examined, no change in status, stable for surgery.  I have reviewed the patient's chart and labs.  Questions were answered to the patient's satisfaction.     Milus Banister

## 2017-09-25 NOTE — Telephone Encounter (Signed)
[facility] referral has been sent.

## 2017-09-25 NOTE — Telephone Encounter (Signed)
-----   Message from Milus Banister, MD sent at 09/25/2017  2:03 PM EDT ----- Chong Sicilian, He needs referral to Dr. Barry Dienes at [facility] for pancreatic tail mass, prelim cytology suggests pancreatic neuroendocrine tumor.  Dawn,  Can you add him to next week GI conference?  Thanks

## 2017-09-25 NOTE — Anesthesia Procedure Notes (Signed)
Procedure Name: MAC Date/Time: 09/25/2017 1:04 PM Performed by: Lissa Morales, CRNA Pre-anesthesia Checklist: Patient identified, Suction available, Emergency Drugs available, Patient being monitored and Timeout performed Patient Re-evaluated:Patient Re-evaluated prior to induction Oxygen Delivery Method: Nasal cannula Placement Confirmation: positive ETCO2

## 2017-09-25 NOTE — Op Note (Signed)
[facility] Patient Name: Andre Simmons Procedure Date: 09/25/2017 MRN: [id number] Attending MD: Milus Banister , MD Date of Birth: [birth year] CSN: [id number] Age: 42 Admit Type: Outpatient Procedure:                Upper EUS Indications:              pancreatic tail mass on recent CT, MRI; present but                            not mentioned on CT 2017 and unchanged in size                            since then. Anorexia and unintentional weight loss++ Providers:                Milus Banister, MD, Burtis Junes, RN, Cherylynn Ridges,                            Technician, Enrigue Catena, CRNA Referring MD:              Medicines:                Monitored Anesthesia Care Complications:            No immediate complications. Estimated blood loss:                            None. Estimated Blood Loss:     Estimated blood loss: none. Procedure:                Pre-Anesthesia Assessment:                           - Prior to the procedure, a History and Physical                            was performed, and patient medications and                            allergies were reviewed. The patient's tolerance of                            previous anesthesia was also reviewed. The risks                            and benefits of the procedure and the sedation                            options and risks were discussed with the patient.                            All questions were answered, and informed consent                            was obtained. Prior Anticoagulants: The patient has  taken no previous anticoagulant or antiplatelet                            agents. ASA Grade Assessment: II - A patient with                            mild systemic disease. After reviewing the risks                            and benefits, the patient was deemed in                            satisfactory condition to undergo the procedure.                           After  obtaining informed consent, the endoscope was                            passed under direct vision. Throughout the                            procedure, the patient's blood pressure, pulse, and                            oxygen saturations were monitored continuously. The                            [id number] ([id number]) Olympus Radial EUS was                            introduced through the mouth, and advanced to the                            second part of duodenum. The [id number])                            Olympus Linear EUS was introduced through the                            mouth, and advanced to the second part of duodenum.                            The upper EUS was accomplished without difficulty.                            The patient tolerated the procedure well. Findings:      ENDOSCOPIC FINDING: :      The examined esophagus was endoscopically normal.      The entire examined stomach was endoscopically normal.      The examined duodenum was endoscopically normal.      ENDOSONOGRAPHIC FINDING: :      1. A round mass was identified in the pancreatic tail. The mass was       hypoechoic and heterogenous. The mass measured 3.5 mm in maximal  cross-sectional diameter. The outer margins were irregular. Fine needle       aspiration for cytology was performed. Color Doppler imaging was       utilized prior to needle puncture to confirm a lack of significant       vascular structures within the needle path. Three passes were made with       the 22 gauge needle using a transgastric approach. Final cytology       results are pending.      2. The pancreatic parenchyma was otherwise normal.      3. Normal main pancreatic duct.      4. No peripancreatic adenopathy.      5. Limited views of the liver, spleen were normal. Impression:               - 3.5cm solid mass in the tail of pancreas. This                            may be causing his LUQ pain and possibly his weight                             loss as well and it will likely require surgical                            resection. 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 Moderate Sedation:      N/A- Per Anesthesia Care Recommendation:           - Discharge patient to home.                           - Await final pathology.                           - Will begin referral process to [facility] Dr. Barry Dienes. Procedure Code(s):        --- Professional ---                           [phone], Esophagogastroduodenoscopy, flexible,                            transoral; with transendoscopic ultrasound-guided                            intramural or transmural fine needle                            aspiration/biopsy(s), (includes endoscopic                            ultrasound examination limited to the esophagus,                            stomach or duodenum, and adjacent structures) Diagnosis Code(s):        --- Professional ---  K86.89, Other specified diseases of pancreas                           R93.3, Abnormal findings on diagnostic imaging of                            other parts of digestive tract CPT copyright 2017 [employer]. All rights reserved. The codes documented in this report are preliminary and upon coder review may  be revised to meet current compliance requirements. Milus Banister, MD 09/25/2017 2:02:37 PM This report has been signed electronically. Number of Addenda: 0

## 2017-09-26 ENCOUNTER — Emergency Department (HOSPITAL_COMMUNITY): Payer: Medicare Other

## 2017-09-26 ENCOUNTER — Inpatient Hospital Stay (HOSPITAL_COMMUNITY)
Admission: EM | Admit: 2017-09-26 | Discharge: 2017-10-09 | DRG: 440 | Disposition: A | Discharge status: Home / Self Care | Payer: Medicare Other | Attending: Family Medicine | Admitting: Family Medicine

## 2017-09-26 ENCOUNTER — Other Ambulatory Visit: Payer: Self-pay

## 2017-09-26 ENCOUNTER — Encounter (HOSPITAL_COMMUNITY): Payer: Self-pay | Admitting: Emergency Medicine

## 2017-09-26 ENCOUNTER — Telehealth: Payer: Self-pay | Admitting: Gastroenterology

## 2017-09-26 DIAGNOSIS — Z6827 Body mass index (BMI) 27.0-27.9, adult: Secondary | ICD-10-CM | POA: Diagnosis not present

## 2017-09-26 DIAGNOSIS — R1012 Left upper quadrant pain: Secondary | ICD-10-CM | POA: Diagnosis present

## 2017-09-26 DIAGNOSIS — Z800 Family history of malignant neoplasm of digestive organs: Secondary | ICD-10-CM

## 2017-09-26 DIAGNOSIS — R112 Nausea with vomiting, unspecified: Secondary | ICD-10-CM | POA: Diagnosis not present

## 2017-09-26 DIAGNOSIS — R1013 Epigastric pain: Secondary | ICD-10-CM | POA: Diagnosis not present

## 2017-09-26 DIAGNOSIS — K861 Other chronic pancreatitis: Secondary | ICD-10-CM | POA: Diagnosis not present

## 2017-09-26 DIAGNOSIS — R1110 Vomiting, unspecified: Secondary | ICD-10-CM | POA: Insufficient documentation

## 2017-09-26 DIAGNOSIS — Z808 Family history of malignant neoplasm of other organs or systems: Secondary | ICD-10-CM | POA: Diagnosis not present

## 2017-09-26 DIAGNOSIS — R630 Anorexia: Secondary | ICD-10-CM | POA: Diagnosis present

## 2017-09-26 DIAGNOSIS — K858 Other acute pancreatitis without necrosis or infection: Secondary | ICD-10-CM | POA: Diagnosis not present

## 2017-09-26 DIAGNOSIS — K859 Acute pancreatitis without necrosis or infection, unspecified: Secondary | ICD-10-CM | POA: Diagnosis present

## 2017-09-26 DIAGNOSIS — K869 Disease of pancreas, unspecified: Secondary | ICD-10-CM | POA: Diagnosis not present

## 2017-09-26 DIAGNOSIS — M25569 Pain in unspecified knee: Secondary | ICD-10-CM | POA: Diagnosis present

## 2017-09-26 DIAGNOSIS — K219 Gastro-esophageal reflux disease without esophagitis: Secondary | ICD-10-CM | POA: Diagnosis not present

## 2017-09-26 DIAGNOSIS — G8929 Other chronic pain: Secondary | ICD-10-CM | POA: Diagnosis present

## 2017-09-26 DIAGNOSIS — E876 Hypokalemia: Secondary | ICD-10-CM | POA: Diagnosis not present

## 2017-09-26 DIAGNOSIS — M549 Dorsalgia, unspecified: Secondary | ICD-10-CM | POA: Diagnosis not present

## 2017-09-26 DIAGNOSIS — R634 Abnormal weight loss: Secondary | ICD-10-CM | POA: Diagnosis not present

## 2017-09-26 DIAGNOSIS — F329 Major depressive disorder, single episode, unspecified: Secondary | ICD-10-CM | POA: Diagnosis present

## 2017-09-26 DIAGNOSIS — Z79899 Other long term (current) drug therapy: Secondary | ICD-10-CM

## 2017-09-26 DIAGNOSIS — Z803 Family history of malignant neoplasm of breast: Secondary | ICD-10-CM | POA: Diagnosis not present

## 2017-09-26 LAB — COMPREHENSIVE METABOLIC PANEL
ALT: 41 U/L (ref 0–44)
AST: 22 U/L (ref 15–41)
Albumin: 4.7 g/dL (ref 3.5–5.0)
Alkaline Phosphatase: 79 U/L (ref 38–126)
Anion gap: 11 (ref 5–15)
BILIRUBIN TOTAL: 0.4 mg/dL (ref 0.3–1.2)
BUN: 12 mg/dL (ref 6–20)
CALCIUM: 9.3 mg/dL (ref 8.9–10.3)
CO2: 23 mmol/L (ref 22–32)
CREATININE: 0.82 mg/dL (ref 0.61–1.24)
Chloride: 105 mmol/L (ref 98–111)
GFR calc non Af Amer: >60 mL/min (ref >60)
GLUCOSE: 111 mg/dL — AB (ref 70–99)
Potassium: 4.2 mmol/L (ref 3.5–5.1)
Sodium: 139 mmol/L (ref 135–145)
TOTAL PROTEIN: 8 g/dL (ref 6.5–8.1)

## 2017-09-26 LAB — URINALYSIS, ROUTINE W REFLEX MICROSCOPIC
Bilirubin Urine: NEGATIVE
GLUCOSE, UA: NEGATIVE mg/dL
Hgb urine dipstick: NEGATIVE
Ketones, ur: NEGATIVE mg/dL
Nitrite: NEGATIVE
Protein, ur: NEGATIVE mg/dL
SPECIFIC GRAVITY, URINE: 1.009 (ref 1.005–1.030)
pH: 8 (ref 5.0–8.0)

## 2017-09-26 LAB — CBC
HCT: 43.5 % (ref 39.0–52.0)
HEMOGLOBIN: 14.9 g/dL (ref 13.0–17.0)
MCH: 32 pg (ref 26.0–34.0)
MCHC: 34.3 g/dL (ref 30.0–36.0)
MCV: 93.3 fL (ref 78.0–100.0)
Platelets: 295 10*3/uL (ref 150–400)
RBC: 4.66 MIL/uL (ref 4.22–5.81)
RDW: 12.5 % (ref 11.5–15.5)
WBC: 9.1 10*3/uL (ref 4.0–10.5)

## 2017-09-26 LAB — LIPASE, BLOOD: Lipase: 283 U/L — ABNORMAL HIGH (ref 11–51)

## 2017-09-26 LAB — I-STAT TROPONIN, ED: Troponin i, poc: 0.02 ng/mL (ref 0.00–0.08)

## 2017-09-26 MED ORDER — IOPAMIDOL (ISOVUE-300) INJECTION 61%
100.0000 mL | Freq: Once | INTRAVENOUS | Status: AC | PRN
  Administered 2017-09-26: 100 mL via INTRAVENOUS

## 2017-09-26 MED ORDER — ONDANSETRON HCL 4 MG/2ML IJ SOLN
4.0000 mg | Freq: Four times a day (QID) | INTRAMUSCULAR | Status: DC | PRN
  Administered 2017-09-26 – 2017-10-04 (×14): 4 mg via INTRAVENOUS
  Filled 2017-09-26 (×16): qty 2

## 2017-09-26 MED ORDER — SODIUM CHLORIDE 0.9 % IV BOLUS
1000.0000 mL | Freq: Once | INTRAVENOUS | Status: AC
  Administered 2017-09-26: 1000 mL via INTRAVENOUS

## 2017-09-26 MED ORDER — ONDANSETRON HCL 4 MG PO TABS
4.0000 mg | ORAL_TABLET | Freq: Four times a day (QID) | ORAL | Status: DC | PRN
  Administered 2017-10-02 – 2017-10-08 (×4): 4 mg via ORAL
  Filled 2017-09-26 (×3): qty 1

## 2017-09-26 MED ORDER — SODIUM CHLORIDE 0.9 % IV SOLN
INTRAVENOUS | Status: DC
  Administered 2017-09-26 – 2017-09-28 (×6): via INTRAVENOUS
  Administered 2017-09-28: 1000 mL via INTRAVENOUS
  Administered 2017-09-28 – 2017-10-01 (×7): via INTRAVENOUS

## 2017-09-26 MED ORDER — HYDROMORPHONE HCL 1 MG/ML IJ SOLN
1.0000 mg | INTRAMUSCULAR | Status: DC | PRN
  Administered 2017-09-26 – 2017-09-27 (×4): 1 mg via INTRAVENOUS
  Filled 2017-09-26 (×6): qty 1

## 2017-09-26 MED ORDER — HYDROMORPHONE HCL 1 MG/ML IJ SOLN
1.0000 mg | Freq: Once | INTRAMUSCULAR | Status: AC
  Administered 2017-09-26: 1 mg via INTRAVENOUS

## 2017-09-26 MED ORDER — SERTRALINE HCL 50 MG PO TABS
50.0000 mg | ORAL_TABLET | Freq: Every day | ORAL | Status: DC
  Administered 2017-09-27 – 2017-10-09 (×13): 50 mg via ORAL
  Filled 2017-09-26 (×13): qty 1

## 2017-09-26 MED ORDER — MORPHINE SULFATE (PF) 4 MG/ML IV SOLN
4.0000 mg | Freq: Once | INTRAVENOUS | Status: AC
  Administered 2017-09-26: 4 mg via INTRAVENOUS
  Filled 2017-09-26: qty 1

## 2017-09-26 MED ORDER — OXYCODONE-ACETAMINOPHEN 5-325 MG PO TABS
1.0000 | ORAL_TABLET | Freq: Four times a day (QID) | ORAL | Status: DC | PRN
  Administered 2017-09-26 – 2017-10-01 (×19): 1 via ORAL
  Filled 2017-09-26 (×20): qty 1

## 2017-09-26 MED ORDER — IOPAMIDOL (ISOVUE-300) INJECTION 61%
INTRAVENOUS | Status: AC
  Filled 2017-09-26: qty 100

## 2017-09-26 MED ORDER — KETOROLAC TROMETHAMINE 30 MG/ML IJ SOLN
30.0000 mg | Freq: Once | INTRAMUSCULAR | Status: AC
  Administered 2017-09-26: 30 mg via INTRAVENOUS
  Filled 2017-09-26: qty 1

## 2017-09-26 MED ORDER — HYDROMORPHONE HCL 1 MG/ML IJ SOLN
1.0000 mg | Freq: Once | INTRAMUSCULAR | Status: AC
  Administered 2017-09-26: 1 mg via INTRAVENOUS
  Filled 2017-09-26: qty 1

## 2017-09-26 MED ORDER — ONDANSETRON HCL 4 MG/2ML IJ SOLN
4.0000 mg | Freq: Once | INTRAMUSCULAR | Status: AC
Start: 2017-09-26 — End: 2017-09-26
  Administered 2017-09-26: 4 mg via INTRAVENOUS
  Filled 2017-09-26: qty 2

## 2017-09-26 NOTE — H&P (Signed)
History and Physical    Andre Simmons:[id number] DOB: [birth year] DOA: 09/26/2017  PCP: Maryella Shivers, MD  Patient coming from: Home  Chief Complaint: Abdominal pain  HPI: Andre Simmons is a 42 y.o. male with medical history significant of pancreatic mass the last 2 years status post biopsy by Dr. Ardis Hughs yesterday comes in with acute epigastric abdominal pain that started last night.  Patient denies any fevers.  He is not had any pain associated with this mass previously.  He denies any diarrhea but has been having some vomiting.  He was told yesterday that the mass was not malignant it was a benign tumor.  He has been referred to outpatient surgery.  Patient today found to have evidence of acute pancreatitis on CT scan with a lipase of less than 300.  Patient referred for admission for acute pancreatitis.  Patient has received a dose of IV Dilaudid which is helped him a lot.  Review of Systems: As per HPI otherwise 10 point review of systems negative.   Past Medical History:  Diagnosis Date  . Chronic back pain   . GERD (gastroesophageal reflux disease)   . Knee pain     Past Surgical History:  Procedure Laterality Date  . BACK SURGERY     L4-L5, S1 micro discectomy  . EUS N/A 09/25/2017   Procedure: UPPER ENDOSCOPIC ULTRASOUND (EUS) LINEAR;  Surgeon: Milus Banister, MD;  Location: [facility];  Service: Endoscopy;  Laterality: N/A;  Radial and Linear  . FINE NEEDLE ASPIRATION  09/25/2017   Procedure: FINE NEEDLE ASPIRATION (FNA) RADIAL;  Surgeon: Milus Banister, MD;  Location: [facility]OS[facility];  Service: Endoscopy;;  . KNEE SURGERY Left    x 20 surgeries  . MOUTH SURGERY     as a child, unsure if wisdom teeth or tonsils     reports that he has never smoked. He has never used smokeless tobacco. He reports that he does not drink alcohol or use drugs.  No Known Allergies  Family History  Problem Relation Age of Onset  . Thyroid cancer Mother   . Other Father    MVA at age 61  . Esophageal cancer Maternal Grandmother        smoker  . Breast cancer Maternal Grandmother   . Esophageal cancer Paternal Grandmother   . Colon cancer Neg Hx   . Rectal cancer Neg Hx     Prior to Admission medications   Medication Sig Start Date End Date Taking? Authorizing Provider  omeprazole (PRILOSEC) 40 MG capsule Take 40 mg by mouth 2 (two) times daily.    Yes [provider]  oxyCODONE-acetaminophen (PERCOCET) 5-325 MG tablet Take 1 tablet by mouth every 6 (six) hours as needed for severe pain. 09/06/17  Yes Molpus, John, MD  promethazine (PHENERGAN) 12.5 MG tablet Take 1 tablet (12.5 mg total) by mouth every 8 (eight) hours as needed for nausea or vomiting. 09/18/17  Yes Zehr, Laban Emperor, PA-C  sertraline (ZOLOFT) 50 MG tablet Take 50 mg by mouth daily.   Yes [provider]  ondansetron (ZOFRAN ODT) 8 MG disintegrating tablet Take 1 tablet (8 mg total) by mouth every 8 (eight) hours as needed for nausea or vomiting. Patient not taking: Reported on 09/26/2017 09/06/17   Molpus, Jenny Reichmann, MD    Physical Exam: Vitals:   09/26/17 1349  BP: (!) 144/106  Pulse: 90  Resp: 15  Temp: 97.6 F (36.4 C)  TempSrc: Oral  SpO2: 100%  Constitutional: NAD, calm, comfortable Vitals:   09/26/17 1349  BP: (!) 144/106  Pulse: 90  Resp: 15  Temp: 97.6 F (36.4 C)  TempSrc: Oral  SpO2: 100%   Eyes: PERRL, lids and conjunctivae normal ENMT: Mucous membranes are moist. Posterior pharynx clear of any exudate or lesions.Normal dentition.  Neck: normal, supple, no masses, no thyromegaly Respiratory: clear to auscultation bilaterally, no wheezing, no crackles. Normal respiratory effort. No accessory muscle use.  Cardiovascular: Regular rate and rhythm, no murmurs / rubs / gallops. No extremity edema. 2+ pedal pulses. No carotid bruits.  Abdomen: Epigastric tenderness, no masses palpated. No hepatosplenomegaly. Bowel sounds positive.  Musculoskeletal: no  clubbing / cyanosis. No joint deformity upper and lower extremities. Good ROM, no contractures. Normal muscle tone.  Skin: no rashes, lesions, ulcers. No induration Neurologic: CN 2-12 grossly intact. Sensation intact, DTR normal. Strength 5/5 in all 4.  Psychiatric: Normal judgment and insight. Alert and oriented x 3. Normal mood.    Labs on Admission: I have personally reviewed following labs and imaging studies  CBC: Recent Labs  Lab 09/26/17 1421  WBC 9.1  HGB 14.9  HCT 43.5  MCV 93.3  PLT 229   Basic Metabolic Panel: Recent Labs  Lab 09/26/17 1421  NA 139  K 4.2  CL 105  CO2 23  GLUCOSE 111*  BUN 12  CREATININE 0.82  CALCIUM 9.3   GFR: Estimated Creatinine Clearance: 141.7 mL/min (by C-G formula based on SCr of 0.82 mg/dL). Liver Function Tests: Recent Labs  Lab 09/26/17 1421  AST 22  ALT 41  ALKPHOS 79  BILITOT 0.4  PROT 8.0  ALBUMIN 4.7   Recent Labs  Lab 09/26/17 1421  LIPASE 283*   No results for input(s): AMMONIA in the last 168 hours. Coagulation Profile: No results for input(s): INR, PROTIME in the last 168 hours. Cardiac Enzymes: No results for input(s): CKTOTAL, CKMB, CKMBINDEX, TROPONINI in the last 168 hours. BNP (last 3 results) No results for input(s): PROBNP in the last 8760 hours. HbA1C: No results for input(s): HGBA1C in the last 72 hours. CBG: No results for input(s): GLUCAP in the last 168 hours. Lipid Profile: No results for input(s): CHOL, HDL, LDLCALC, TRIG, CHOLHDL, LDLDIRECT in the last 72 hours. Thyroid Function Tests: No results for input(s): TSH, T4TOTAL, FREET4, T3FREE, THYROIDAB in the last 72 hours. Anemia Panel: No results for input(s): VITAMINB12, FOLATE, FERRITIN, TIBC, IRON, RETICCTPCT in the last 72 hours. Urine analysis:    Component Value Date/Time   COLORURINE STRAW (A) 09/26/2017 1636   APPEARANCEUR CLEAR 09/26/2017 1636   LABSPEC 1.009 09/26/2017 1636   PHURINE 8.0 09/26/2017 1636   GLUCOSEU NEGATIVE  09/26/2017 1636   HGBUR NEGATIVE 09/26/2017 1636   BILIRUBINUR NEGATIVE 09/26/2017 1636   KETONESUR NEGATIVE 09/26/2017 1636   PROTEINUR NEGATIVE 09/26/2017 1636   UROBILINOGEN 0.2 09/19/2009 1400   NITRITE NEGATIVE 09/26/2017 1636   LEUKOCYTESUR TRACE (A) 09/26/2017 1636   Sepsis Labs: !!!!!!!!!!!!!!!!!!!!!!!!!!!!!!!!!!!!!!!!!!!! @LABRCNTIP (procalcitonin:4,lacticidven:4) )No results found for this or any previous visit (from the past 240 hour(s)).   Radiological Exams on Admission: Ct Abdomen Pelvis W Contrast  Result Date: 09/26/2017 CLINICAL DATA:  LEFT UPPER QUADRANT abdominal pain radiating into the back, 1 day post endoscopic ultrasound biopsy of a mass involving the pancreatic tail. EXAM: CT ABDOMEN AND PELVIS WITH CONTRAST TECHNIQUE: Multidetector CT imaging of the abdomen and pelvis was performed using the standard protocol following bolus administration of intravenous contrast. CONTRAST:  164mL ISOVUE-300 IOPAMIDOL INJECTION 61% IV.  COMPARISON:  MRI abdomen 09/11/2017. CT abdomen 09/05/2017, 01/29/2016. FINDINGS: Lower chest: Heart size normal.  Visualized lung bases clear. Hepatobiliary: Liver normal in size and appearance. Gallbladder normal in appearance without calcified gallstones. No biliary ductal dilation. Pancreas: Mild edema/inflammation surrounding the body and tail of the pancreas, new since the most recent prior MRI and CT. The calcified heterogeneous soft tissue mass involving the pancreatic tail is unchanged, measuring approximately 3.8 x 3.2 cm (series 2, image 36). Remainder of the pancreas remains normal in appearance. Spleen: Normal in size and appearance. Adrenals/Urinary Tract: Normal appearing adrenal glands. Kidneys normal in size and appearance without focal parenchymal abnormality. No evidence of urinary tract calculi or obstruction. Normal-appearing urinary bladder. Stomach/Bowel: Stomach normal in appearance for the degree of distention. Normal-appearing small  bowel. Scattered sigmoid colon diverticula without evidence of acute diverticulitis. Remainder of the colon normal in appearance. Normal caliber appendix containing a small appendicolith, without evidence of periappendiceal inflammation. Vascular/Lymphatic: Aortoiliac atherosclerosis, advanced for patient age, without evidence of aneurysm. Normal-appearing portal venous and systemic venous systems. No pathologic lymphadenopathy. Reproductive: Prostate gland and seminal vesicles normal in size and appearance for age. Other: None. Musculoskeletal: Degenerative disc disease, spondylosis and facet degenerative changes at L5-S1. No acute findings. IMPRESSION: 1. Acute pancreatitis involving the body and tail of the pancreas. 2. Stable calcified mass involving the pancreatic tail. 3. No evidence of metastatic disease. 4.  Aortic Atherosclerosis, advanced for patient age.  (ICD10-170.0) Electronically Signed   By: Evangeline Dakin M.D.   On: 09/26/2017 17:07    Old chart reviewed Case discussed with EDP Mercedes PA Twelve-lead EKG reviewed normal sinus rhythm no acute changes  Assessment/Plan 42 year old healthy male comes in with acute pancreatitis status post biopsy yesterday of the pancreatic mass Principal Problem:   Acute pancreatitis-likely induced by procedure yesterday.  There is no evidence of bleeding intra-abdominal bleed.  Lipase is less than 300.  Conservative treatment.  Bowel rest.  IV fluids.  N.p.o. except meds.  If patient doing better consider advancing his diet in the morning.  PRN Dilaudid and PRN Zofran ordered.  Abdominal exam is benign.  No evidence of infection or necrosis.  Active Problems:   Pancreatic lesion-status post biopsy yesterday final pathology is pending    Chronic back pain-noted     DVT prophylaxis: SCDs Code Status: Full Family Communication: Wife Disposition Plan: 1 to 3 days Consults called: None Admission status: Admission   Dystany Duffy A MD [facility]  If 7PM-7AM, please contact night-coverage [email or web address] Password [facility]  09/26/2017, 6:16 PM

## 2017-09-26 NOTE — ED Provider Notes (Signed)
[facility] DEPT Provider Note   CSN: [id number] Arrival date & time: 09/26/17  1342     History   Chief Complaint Chief Complaint  Patient presents with  . Abdominal Pain  . Back Pain    HPI Andre Simmons is a 42 y.o. male with a PMHx of chronic back pain, GERD, and pancreatic tail mass, who presents to the ED with complaints of sudden onset LUQ pain that began at 3am.  Pt states that he has had LUQ pain for 2-3 months; pt was noted to have a pancreatic tail mass on imaging several years ago, so when he began having progressive symptoms (LUQ pain, anorexia, wt loss) for the last several months they decided to proceed with bx. Pt underwent an EUS with pancreatic tail mass biopsy yesterday by Dr. Ardis Hughs of [facility].  He states his pain was fine yesterday after the procedure but around 3am it suddenly worsened.  He describes his pain as 10/10 constant stabbing and throbbing LUQ pain that radiates into the left flank area, with no known aggravating factors, and unrelieved with Percocet 5-325 mg x 2 tablets.  He reports associated nausea and 2 episodes of nonbloody nonbilious emesis.  He called his GI specialist who recommended that he come here for evaluation of his pain.  [facility] P[facility] is [facility] in [place].  He denies fevers, chills, CP, SOB, diarrhea/constipation, obstipation, melena, hematochezia, hematemesis, hematuria, dysuria, myalgias, arthralgias, numbness, tingling, focal weakness, or any other complaints at this time. Denies recent travel, sick contacts, suspicious food intake, EtOH use, or frequent NSAID use.  The history is provided by the patient and medical records. No language interpreter was used.  Abdominal Pain   Associated symptoms include nausea and vomiting. Pertinent negatives include fever, diarrhea, constipation, dysuria, hematuria, arthralgias and myalgias.  Back Pain   Associated symptoms include abdominal pain. Pertinent  negatives include no chest pain, no fever, no numbness, no dysuria and no weakness.    Past Medical History:  Diagnosis Date  . Chronic back pain   . GERD (gastroesophageal reflux disease)   . Knee pain     Patient Active Problem List   Diagnosis Date Noted  . Abnormal MRI 09/18/2017  . Pancreatic lesion 09/18/2017    Past Surgical History:  Procedure Laterality Date  . BACK SURGERY     L4-L5, S1 micro discectomy  . KNEE SURGERY Left    x 20 surgeries  . MOUTH SURGERY     as a child, unsure if wisdom teeth or tonsils        Home Medications    Prior to Admission medications   Medication Sig Start Date End Date Taking? Authorizing Provider  omeprazole (PRILOSEC) 40 MG capsule Take 40 mg by mouth 2 (two) times daily.     [provider]  ondansetron (ZOFRAN ODT) 8 MG disintegrating tablet Take 1 tablet (8 mg total) by mouth every 8 (eight) hours as needed for nausea or vomiting. 09/06/17   Molpus, Jenny Reichmann, MD  oxyCODONE-acetaminophen (PERCOCET) 5-325 MG tablet Take 1 tablet by mouth every 6 (six) hours as needed for severe pain. 09/06/17   Molpus, Jenny Reichmann, MD  promethazine (PHENERGAN) 12.5 MG tablet Take 1 tablet (12.5 mg total) by mouth every 8 (eight) hours as needed for nausea or vomiting. 09/18/17   Zehr, Laban Emperor, PA-C  sertraline (ZOLOFT) 50 MG tablet Take 50 mg by mouth daily.    [provider]    Family History  Family History  Problem Relation Age of Onset  . Thyroid cancer Mother   . Other Father        MVA at age 55  . Esophageal cancer Maternal Grandmother        smoker  . Breast cancer Maternal Grandmother   . Esophageal cancer Paternal Grandmother   . Colon cancer Neg Hx   . Rectal cancer Neg Hx     Social History Social History   Tobacco Use  . Smoking status: Never Smoker  . Smokeless tobacco: Never Used  Substance Use Topics  . Alcohol use: No  . Drug use: No     Allergies   Patient has no known allergies.   Review of  Systems Review of Systems  Constitutional: Negative for chills and fever.  Respiratory: Negative for shortness of breath.   Cardiovascular: Negative for chest pain.  Gastrointestinal: Positive for abdominal pain, nausea and vomiting. Negative for blood in stool, constipation and diarrhea.  Genitourinary: Negative for dysuria and hematuria.  Musculoskeletal: Positive for back pain. Negative for arthralgias and myalgias.  Skin: Negative for color change.  Allergic/Immunologic: Negative for immunocompromised state.  Neurological: Negative for weakness and numbness.  Psychiatric/Behavioral: Negative for confusion.   All other systems reviewed and are negative for acute change except as noted in the HPI.    Physical Exam Updated Vital Signs BP (!) 144/106   Pulse 90   Temp 97.6 F (36.4 C) (Oral)   Resp 15   SpO2 100%   Physical Exam  Constitutional: He is oriented to person, place, and time. Vital signs are normal. He appears well-developed and well-nourished.  Non-toxic appearance. No distress.  Afebrile, nontoxic, NAD  HENT:  Head: Normocephalic and atraumatic.  Mouth/Throat: Oropharynx is clear and moist and mucous membranes are normal.  Eyes: Conjunctivae and EOM are normal. Right eye exhibits no discharge. Left eye exhibits no discharge.  Neck: Normal range of motion. Neck supple.  Cardiovascular: Normal rate, regular rhythm, normal heart sounds and intact distal pulses. Exam reveals no gallop and no friction rub.  No murmur heard. Pulmonary/Chest: Effort normal and breath sounds normal. No respiratory distress. He has no decreased breath sounds. He has no wheezes. He has no rhonchi. He has no rales.  Abdominal: Soft. Normal appearance and bowel sounds are normal. He exhibits no distension. There is tenderness in the left upper quadrant. There is CVA tenderness. There is no rigidity, no rebound, no guarding, no tenderness at McBurney's point and negative Murphy's sign.  Soft,  nondistended, +BS throughout, with moderate LUQ TTP, no r/g/r, neg murphy's, neg mcburney's, and with mild L sided CVA TTP   Musculoskeletal: Normal range of motion.  Neurological: He is alert and oriented to person, place, and time. He has normal strength. No sensory deficit.  Skin: Skin is warm, dry and intact. No rash noted.  Psychiatric: He has a normal mood and affect.  Nursing note and vitals reviewed.    ED Treatments / Results  Labs (all labs ordered are listed, but only abnormal results are displayed) Labs Reviewed  LIPASE, BLOOD - Abnormal; Notable for the following components:      Result Value   Lipase 283 (*)    All other components within normal limits  COMPREHENSIVE METABOLIC PANEL - Abnormal; Notable for the following components:   Glucose, Bld 111 (*)    All other components within normal limits  URINALYSIS, ROUTINE W REFLEX MICROSCOPIC - Abnormal; Notable for the following components:   Color,  Urine STRAW (*)    Leukocytes, UA TRACE (*)    Bacteria, UA RARE (*)    All other components within normal limits  URINE CULTURE  CBC  I-STAT TROPONIN, ED    EKG EKG Interpretation  Date/Time:  Friday September 26 2017 15:25:55 EDT Ventricular Rate:  66 PR Interval:    QRS Duration: 103 QT Interval:  412 QTC Calculation: 432 R Axis:   -37 Text Interpretation:  Sinus rhythm Left axis deviation no ischemic changes. no old comparison Confirmed by Charlesetta Shanks [phone]) on 09/26/2017 3:41:13 PM   Radiology Ct Abdomen Pelvis W Contrast  Result Date: 09/26/2017 CLINICAL DATA:  LEFT UPPER QUADRANT abdominal pain radiating into the back, 1 day post endoscopic ultrasound biopsy of a mass involving the pancreatic tail. EXAM: CT ABDOMEN AND PELVIS WITH CONTRAST TECHNIQUE: Multidetector CT imaging of the abdomen and pelvis was performed using the standard protocol following bolus administration of intravenous contrast. CONTRAST:  182mL ISOVUE-300 IOPAMIDOL INJECTION 61% IV.  COMPARISON:  MRI abdomen 09/11/2017. CT abdomen 09/05/2017, 01/29/2016. FINDINGS: Lower chest: Heart size normal.  Visualized lung bases clear. Hepatobiliary: Liver normal in size and appearance. Gallbladder normal in appearance without calcified gallstones. No biliary ductal dilation. Pancreas: Mild edema/inflammation surrounding the body and tail of the pancreas, new since the most recent prior MRI and CT. The calcified heterogeneous soft tissue mass involving the pancreatic tail is unchanged, measuring approximately 3.8 x 3.2 cm (series 2, image 36). Remainder of the pancreas remains normal in appearance. Spleen: Normal in size and appearance. Adrenals/Urinary Tract: Normal appearing adrenal glands. Kidneys normal in size and appearance without focal parenchymal abnormality. No evidence of urinary tract calculi or obstruction. Normal-appearing urinary bladder. Stomach/Bowel: Stomach normal in appearance for the degree of distention. Normal-appearing small bowel. Scattered sigmoid colon diverticula without evidence of acute diverticulitis. Remainder of the colon normal in appearance. Normal caliber appendix containing a small appendicolith, without evidence of periappendiceal inflammation. Vascular/Lymphatic: Aortoiliac atherosclerosis, advanced for patient age, without evidence of aneurysm. Normal-appearing portal venous and systemic venous systems. No pathologic lymphadenopathy. Reproductive: Prostate gland and seminal vesicles normal in size and appearance for age. Other: None. Musculoskeletal: Degenerative disc disease, spondylosis and facet degenerative changes at L5-S1. No acute findings. IMPRESSION: 1. Acute pancreatitis involving the body and tail of the pancreas. 2. Stable calcified mass involving the pancreatic tail. 3. No evidence of metastatic disease. 4.  Aortic Atherosclerosis, advanced for patient age.  (ICD10-170.0) Electronically Signed   By: Evangeline Dakin M.D.   On: 09/26/2017 17:07     EUS  09/25/17: Impression: - 3.5cm solid mass in the tail of pancreas. This may be causing his LUQ pain and possibly his weight loss as well and it will likely require surgical resection. 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   Procedures Procedures (including critical care time)  Medications Ordered in ED Medications  iopamidol (ISOVUE-300) 61 % injection (has no administration in time range)  sodium chloride 0.9 % bolus 1,000 mL (1,000 mLs Intravenous New Bag/Given 09/26/17 1713)  sodium chloride 0.9 % bolus 1,000 mL (0 mLs Intravenous Stopped 09/26/17 1714)  morphine 4 MG/ML injection 4 mg (4 mg Intravenous Given 09/26/17 1520)  ondansetron (ZOFRAN) injection 4 mg (4 mg Intravenous Given 09/26/17 1520)  iopamidol (ISOVUE-300) 61 % injection 100 mL (100 mLs Intravenous Contrast Given 09/26/17 1635)  HYDROmorphone (DILAUDID) injection 1 mg (1 mg Intravenous Given 09/26/17 1713)     Initial Impression / Assessment and Plan / ED Course  I  have reviewed the triage vital signs and the nursing notes.  Pertinent labs & imaging results that were available during my care of the patient were reviewed by me and considered in my medical decision making (see chart for details).     42 y.o. male here with LUQ pain that began around 3am, several hours after having an EUS with pancreatic tail mass bx done yesterday by Dr. Ardis Hughs of [facility]. On exam, moderate LUQ TTP with mild L CVA TTP, nonperitoneal. Will get labs and CT abd/pelv to evaluate for pancreatitis vs injury to organ/perf, etc. Will also get EKG and trop given upper abd pain. Will give pain/nausea meds, fluids, and reassess shortly. Discussed case with my attending Dr. Johnney Killian who agrees with plan.   5:52 PM CBC WNL. CMP essentially WNL. Lipase elevated at 283. U/A with trace leuks, no nitrites, 6-10 WBCs but rare bacteria, doubt UTI; no mention of squamous cells, unclear if this was a contaminated sample or not. Will  send for UCx. Trop neg. EKG nonischemic. CT abd/pelv confirming acute pancreatitis involving body and tail of pancreas, as well as a stable calcified mass of the tail of the pancreas. Pt initially had minimal improvement in pain after morphine, just given dilaudid 1mg  and this helped significantly. Nausea improved. Given that he has failed outpatient pain control, and has post-bx pancreatitis, will proceed with admission for fluids/pain control as his pancreatitis resolves.   6:15 PM Dr. Shanon Brow of [facility] returning page and will admit. Holding orders to be placed by admitting team. Please see their notes for further documentation of care. I appreciate their help with this pleasant pt's care. Pt stable at time of admission.    Final Clinical Impressions(s) / ED Diagnoses   Final diagnoses:  Other acute pancreatitis without infection or necrosis  Acute LUQ pain  Nausea and vomiting in adult patient    ED Discharge Orders    [address] [place], [place] 09/26/17 1815    Charlesetta Shanks, MD 10/13/17 1501

## 2017-09-26 NOTE — ED Triage Notes (Signed)
Patient here from home with complaints of abdominal pain radiating into back. Pain to left upper abdomen under the rib. Pain started after biopsy of pancreas.

## 2017-09-26 NOTE — Telephone Encounter (Signed)
Right sided abd pain that started over night last night after EUS yesterday 09/25/17.  The pain is radiating to the back. Pain medication (percocet 5-325 mg) is not relieving the pain.   The pain is a 9-10/10.  The pt was advised to go the [facility] ED for eval.  I will notify Dr Ardis Hughs

## 2017-09-26 NOTE — Anesthesia Postprocedure Evaluation (Signed)
Anesthesia Post Note  Patient: Andre Simmons  Procedure(s) Performed: UPPER ENDOSCOPIC ULTRASOUND (EUS) LINEAR (N/A ) FINE NEEDLE ASPIRATION (FNA) RADIAL     Patient location during evaluation: PACU Anesthesia Type: MAC Level of consciousness: awake and alert Pain management: pain level controlled Vital Signs Assessment: post-procedure vital signs reviewed and stable Respiratory status: spontaneous breathing, nonlabored ventilation and respiratory function stable Cardiovascular status: stable and blood pressure returned to baseline Anesthetic complications: no    Last Vitals:  Vitals:   09/25/17 1413 09/25/17 1420  BP: 107/73 112/73  Pulse: (!) 57 (!) 56  Resp: 14 14  Temp: (!) 36.4 C   SpO2: 100% 100%    Last Pain:  Vitals:   09/25/17 1420  TempSrc:   PainSc: 3    Pain Goal:                 Audry Pili

## 2017-09-26 NOTE — ED Notes (Signed)
Report given.   ED TO INPATIENT HANDOFF REPORT  Name/Age/Gender Andre Simmons 42 y.o. male  Code Status    Code Status Orders  (From admission, onward)        Start     Ordered   09/26/17 1846  Full code  Continuous     09/26/17 1846    Code Status History    This patient has a current code status but no historical code status.      Home/SNF/Other Home  Chief Complaint abd pain after procedure  Level of Care/Admitting Diagnosis ED Disposition    ED Disposition Condition Comment   Admit  Hospital Area: [facility] [[id number]]  Level of Care: Med-Surg [16]  Diagnosis: Acute pancreatitis [577.0.ICD-9-CM]  Admitting Physician: Phillips Grout [[id number]]  Attending Physician: Derrill Kay A [[id number]]  Estimated length of stay: past midnight tomorrow  Certification:: I certify this patient will need inpatient services for at least 2 midnights  PT Class (Do Not Modify): Inpatient [101]  PT Acc Code (Do Not Modify): Private [1]       Medical History Past Medical History:  Diagnosis Date  . Chronic back pain   . GERD (gastroesophageal reflux disease)   . Knee pain     Allergies No Known Allergies  IV Location/Drains/Wounds Patient Lines/Drains/Airways Status   Active Line/Drains/Airways    Name:   Placement date:   Placement time:   Site:   Days:   Peripheral IV 09/26/17 Right Arm   09/26/17    1519    Arm   less than 1   AIRWAYS   09/25/17    1251     1   AIRWAYS   09/25/17    1251     1          Labs/Imaging Results for orders placed or performed during the hospital encounter of 09/26/17 (from the past 48 hour(s))  Lipase, blood     Status: Abnormal   Collection Time: 09/26/17  2:21 PM  Result Value Ref Range   Lipase 283 (H) 11 - 51 U/L    Comment: Performed at [facility], [place] [address], [place], [place] [address]  Comprehensive metabolic panel     Status: Abnormal   Collection Time: 09/26/17  2:21 PM  Result  Value Ref Range   Sodium 139 135 - 145 mmol/L   Potassium 4.2 3.5 - 5.1 mmol/L   Chloride 105 98 - 111 mmol/L   CO2 23 22 - 32 mmol/L   Glucose, Bld 111 (H) 70 - 99 mg/dL   BUN 12 6 - 20 mg/dL   Creatinine, Ser 0.82 0.61 - 1.24 mg/dL   Calcium 9.3 8.9 - 10.3 mg/dL   Total Protein 8.0 6.5 - 8.1 g/dL   Albumin 4.7 3.5 - 5.0 g/dL   AST 22 15 - 41 U/L   ALT 41 0 - 44 U/L   Alkaline Phosphatase 79 38 - 126 U/L   Total Bilirubin 0.4 0.3 - 1.2 mg/dL   GFR calc non Af Amer >60 >60 mL/min   GFR calc Af Amer >60 >60 mL/min    Comment: (NOTE) The eGFR has been calculated using the CKD EPI equation. This calculation has not been validated in all clinical situations. eGFR's persistently <60 mL/min signify possible Chronic Kidney Disease.    Anion gap 11 5 - 15    Comment: Performed at [facility], [place] [address] [place], [place] [address]  CBC     Status: None   Collection Time: 09/26/17  2:21 PM  Result Value Ref Range   WBC 9.1 4.0 - 10.5 K/uL   RBC 4.66 4.22 - 5.81 MIL/uL   Hemoglobin 14.9 13.0 - 17.0 g/dL   HCT 43.5 39.0 - 52.0 %   MCV 93.3 78.0 - 100.0 fL   MCH 32.0 26.0 - 34.0 pg   MCHC 34.3 30.0 - 36.0 g/dL   RDW 12.5 11.5 - 15.5 %   Platelets 295 150 - 400 K/uL    Comment: Performed at [facility], [place] [address], [place], [place] [address]  I-stat troponin, ED     Status: None   Collection Time: 09/26/17  3:32 PM  Result Value Ref Range   Troponin i, poc 0.02 0.00 - 0.08 ng/mL   Comment 3            Comment: Due to the release kinetics of cTnI, a negative result within the first hours of the onset of symptoms does not rule out myocardial infarction with certainty. If myocardial infarction is still suspected, repeat the test at appropriate intervals.   Urinalysis, Routine w reflex microscopic     Status: Abnormal   Collection Time: 09/26/17  4:36 PM  Result Value Ref Range   Color, Urine STRAW (A) YELLOW   APPearance CLEAR  CLEAR   Specific Gravity, Urine 1.009 1.005 - 1.030   pH 8.0 5.0 - 8.0   Glucose, UA NEGATIVE NEGATIVE mg/dL   Hgb urine dipstick NEGATIVE NEGATIVE   Bilirubin Urine NEGATIVE NEGATIVE   Ketones, ur NEGATIVE NEGATIVE mg/dL   Protein, ur NEGATIVE NEGATIVE mg/dL   Nitrite NEGATIVE NEGATIVE   Leukocytes, UA TRACE (A) NEGATIVE   WBC, UA 6-10 0 - 5 WBC/hpf   Bacteria, UA RARE (A) NONE SEEN    Comment: Performed at [place] [address], [place], [place] [address]   Ct Abdomen Pelvis W Contrast  Result Date: 09/26/2017 CLINICAL DATA:  LEFT UPPER QUADRANT abdominal pain radiating into the back, 1 day post endoscopic ultrasound biopsy of a mass involving the pancreatic tail. EXAM: CT ABDOMEN AND PELVIS WITH CONTRAST TECHNIQUE: Multidetector CT imaging of the abdomen and pelvis was performed using the standard protocol following bolus administration of intravenous contrast. CONTRAST:  153m ISOVUE-300 IOPAMIDOL INJECTION 61% IV. COMPARISON:  MRI abdomen 09/11/2017. CT abdomen 09/05/2017, 01/29/2016. FINDINGS: Lower chest: Heart size normal.  Visualized lung bases clear. Hepatobiliary: Liver normal in size and appearance. Gallbladder normal in appearance without calcified gallstones. No biliary ductal dilation. Pancreas: Mild edema/inflammation surrounding the body and tail of the pancreas, new since the most recent prior MRI and CT. The calcified heterogeneous soft tissue mass involving the pancreatic tail is unchanged, measuring approximately 3.8 x 3.2 cm (series 2, image 36). Remainder of the pancreas remains normal in appearance. Spleen: Normal in size and appearance. Adrenals/Urinary Tract: Normal appearing adrenal glands. Kidneys normal in size and appearance without focal parenchymal abnormality. No evidence of urinary tract calculi or obstruction. Normal-appearing urinary bladder. Stomach/Bowel: Stomach normal in appearance for the degree of distention. Normal-appearing small  bowel. Scattered sigmoid colon diverticula without evidence of acute diverticulitis. Remainder of the colon normal in appearance. Normal caliber appendix containing a small appendicolith, without evidence of periappendiceal inflammation. Vascular/Lymphatic: Aortoiliac atherosclerosis, advanced for patient age, without evidence of aneurysm. Normal-appearing portal venous and systemic venous systems. No pathologic lymphadenopathy. Reproductive: Prostate gland and seminal vesicles normal in size and appearance for  age. Other: None. Musculoskeletal: Degenerative disc disease, spondylosis and facet degenerative changes at L5-S1. No acute findings. IMPRESSION: 1. Acute pancreatitis involving the body and tail of the pancreas. 2. Stable calcified mass involving the pancreatic tail. 3. No evidence of metastatic disease. 4.  Aortic Atherosclerosis, advanced for patient age.  (ICD10-170.0) Electronically Signed   By: Evangeline Dakin M.D.   On: 09/26/2017 17:07    Pending Labs Unresulted Labs (From admission, onward)   Start     Ordered   09/27/17 0500  Comprehensive metabolic panel  Tomorrow morning,   R     09/26/17 1846   09/27/17 0500  CBC  Tomorrow morning,   R     09/26/17 1846   09/27/17 0500  Lipase, blood  Tomorrow morning,   R     09/26/17 1846      Vitals/Pain Today's Vitals   09/26/17 1349 09/26/17 1355 09/26/17 1544 09/26/17 1900  BP: (!) 144/106   130/87  Pulse: 90   64  Resp: 15   15  Temp: 97.6 F (36.4 C)     TempSrc: Oral     SpO2: 100%   97%  PainSc:  10-Worst pain ever 8      Isolation Precautions No active isolations  Medications Medications  iopamidol (ISOVUE-300) 61 % injection (has no administration in time range)  oxyCODONE-acetaminophen (PERCOCET/ROXICET) 5-325 MG per tablet 1 tablet (has no administration in time range)  sertraline (ZOLOFT) tablet 50 mg (has no administration in time range)  0.9 %  sodium chloride infusion (has no administration in time range)   ondansetron (ZOFRAN) tablet 4 mg (has no administration in time range)    Or  ondansetron (ZOFRAN) injection 4 mg (has no administration in time range)  HYDROmorphone (DILAUDID) injection 1 mg (has no administration in time range)  sodium chloride 0.9 % bolus 1,000 mL (0 mLs Intravenous Stopped 09/26/17 1714)  morphine 4 MG/ML injection 4 mg (4 mg Intravenous Given 09/26/17 1520)  ondansetron (ZOFRAN) injection 4 mg (4 mg Intravenous Given 09/26/17 1520)  iopamidol (ISOVUE-300) 61 % injection 100 mL (100 mLs Intravenous Contrast Given 09/26/17 1635)  HYDROmorphone (DILAUDID) injection 1 mg (1 mg Intravenous Given 09/26/17 1713)  sodium chloride 0.9 % bolus 1,000 mL (1,000 mLs Intravenous New Bag/Given 09/26/17 1713)    Mobility walks

## 2017-09-26 NOTE — ED Notes (Signed)
Transport called to transport patient. 

## 2017-09-26 NOTE — Telephone Encounter (Signed)
I agree, thanks!

## 2017-09-26 NOTE — ED Notes (Signed)
Patient transported upstairs.  

## 2017-09-26 NOTE — Telephone Encounter (Signed)
Patient states he his having extreme abd pain since procedure yesterday with Dr.Jacobs. Patient wanting advice.

## 2017-09-27 LAB — COMPREHENSIVE METABOLIC PANEL
ALK PHOS: 66 U/L (ref 38–126)
ALT: 28 U/L (ref 0–44)
ANION GAP: 8 (ref 5–15)
AST: 15 U/L (ref 15–41)
Albumin: 3.8 g/dL (ref 3.5–5.0)
BILIRUBIN TOTAL: 0.8 mg/dL (ref 0.3–1.2)
BUN: 9 mg/dL (ref 6–20)
CALCIUM: 8.6 mg/dL — AB (ref 8.9–10.3)
CO2: 25 mmol/L (ref 22–32)
Chloride: 108 mmol/L (ref 98–111)
Creatinine, Ser: 0.74 mg/dL (ref 0.61–1.24)
GFR calc non Af Amer: >60 mL/min (ref >60)
Glucose, Bld: 94 mg/dL (ref 70–99)
Potassium: 3.6 mmol/L (ref 3.5–5.1)
SODIUM: 141 mmol/L (ref 135–145)
TOTAL PROTEIN: 6.4 g/dL — AB (ref 6.5–8.1)

## 2017-09-27 LAB — CBC
HCT: 38.8 % — ABNORMAL LOW (ref 39.0–52.0)
HEMOGLOBIN: 13.2 g/dL (ref 13.0–17.0)
MCH: 32.2 pg (ref 26.0–34.0)
MCHC: 34 g/dL (ref 30.0–36.0)
MCV: 94.6 fL (ref 78.0–100.0)
Platelets: 222 10*3/uL (ref 150–400)
RBC: 4.1 MIL/uL — AB (ref 4.22–5.81)
RDW: 12.6 % (ref 11.5–15.5)
WBC: 9.2 10*3/uL (ref 4.0–10.5)

## 2017-09-27 LAB — LIPASE, BLOOD: LIPASE: 208 U/L — AB (ref 11–51)

## 2017-09-27 MED ORDER — HYDROMORPHONE HCL 1 MG/ML IJ SOLN
1.0000 mg | INTRAMUSCULAR | Status: DC | PRN
  Administered 2017-09-27 – 2017-09-29 (×16): 1 mg via INTRAVENOUS
  Filled 2017-09-27 (×16): qty 1

## 2017-09-27 MED ORDER — ACETAMINOPHEN 325 MG PO TABS
650.0000 mg | ORAL_TABLET | Freq: Four times a day (QID) | ORAL | Status: DC | PRN
  Administered 2017-09-28 – 2017-10-06 (×3): 650 mg via ORAL
  Filled 2017-09-27 (×3): qty 2

## 2017-09-27 MED ORDER — HYDROMORPHONE HCL 1 MG/ML IJ SOLN: 1.0000 mg | INTRAMUSCULAR | Status: DC | PRN

## 2017-09-27 NOTE — Progress Notes (Signed)
PROGRESS NOTE    Andre Simmons  WRU:[id number] DOB: 10-19-1975 DOA: 09/26/2017 PCP: Maryella Shivers, MD   Brief Narrative: Patient is a 42 year old male  with recent  finding of pancreatic mass who underwent biopsy of the pancreas few days ago presented with acute epigastric abdominal pain.  Patient found to have acute pancreatitis on CT scan with mild elevation of lipase.  Admitted for further management.  Assessment & Plan:   Principal Problem:   Acute pancreatitis Active Problems:   Pancreatic lesion   Chronic back pain  Acute pancreatitis: CT imaging suggestive of acute pancreatitis.  Patient presented with abdominal pain after biopsy.  No evidence of bleeding as per the imaging.  Lipase less than 200. Continue conservative management with bowel rest, IV fluids.  We will start him on clear liquid diet today.  Medication for nausea. Abdominal examination is benign.  No evidence of infection or necrosis as per the CT imaging.    Pancreatic mass: Was following with Dr. Ardis Hughs, gastro enterology.  Recently had pancreatic biopsy.  Biopsy report pending.  Chronic back pain: Continue supportive care     DVT prophylaxis:SCD Code Status: Full Family Communication: None present at the bedside Disposition Plan: Home after resolution of pain   Consultants: None  Procedures: None  Antimicrobials: None  Subjective: Patient seen and examined the bedside this morning.  He still complains of significant pain.  No nausea or vomiting.  No fever overnight.  Objective: Vitals:   09/26/17 1349 09/26/17 1900 09/26/17 2015 09/27/17 0403  BP: (!) 144/106 130/87 123/84 110/82  Pulse: 90 64 60 62  Resp: 15 15 16 18   Temp: 97.6 F (36.4 C)  98.6 F (37 C) 98.2 F (36.8 C)  TempSrc: Oral  Oral Oral  SpO2: 100% 97% 98% 95%  Weight:   98 kg (216 lb 0.8 oz) 98.2 kg (216 lb 7.9 oz)  Height:   6\' 3"  (1.905 m)     Intake/Output Summary (Last 24 hours) at 09/27/2017 1318 Last data filed  at 09/27/2017 0600 Gross per 24 hour  Intake 2354.55 ml  Output -  Net 2354.55 ml   Filed Weights   09/26/17 2015 09/27/17 0403  Weight: 98 kg (216 lb 0.8 oz) 98.2 kg (216 lb 7.9 oz)    Examination:  General exam:Not in pbvious distress,average built HEENT:PERRL,Oral mucosa moist, Ear/Nose normal on gross exam Respiratory system: Bilateral equal air entry, normal vesicular breath sounds, no wheezes or crackles  Cardiovascular system: S1 & S2 heard, RRR. No JVD, murmurs, rubs, gallops or clicks. No pedal edema. Gastrointestinal system: Abdomen is nondistended, soft .  Tenderness in the epigastric region. No organomegaly or masses felt. Normal bowel sounds heard. Central nervous system: Alert and oriented. No focal neurological deficits. Extremities: No edema, no clubbing ,no cyanosis, distal peripheral pulses palpable. Skin: No rashes, lesions or ulcers,no icterus ,no pallor MSK: Normal muscle bulk,tone ,power Psychiatry: Judgement and insight appear normal. Mood & affect appropriate.     Data Reviewed: I have personally reviewed following labs and imaging studies  CBC: Recent Labs  Lab 09/26/17 1421 09/27/17 0441  WBC 9.1 9.2  HGB 14.9 13.2  HCT 43.5 38.8*  MCV 93.3 94.6  PLT 295 914   Basic Metabolic Panel: Recent Labs  Lab 09/26/17 1421 09/27/17 0441  NA 139 141  K 4.2 3.6  CL 105 108  CO2 23 25  GLUCOSE 111* 94  BUN 12 9  CREATININE 0.82 0.74  CALCIUM 9.3 8.6*  GFR: Estimated Creatinine Clearance: 145.2 mL/min (by C-G formula based on SCr of 0.74 mg/dL). Liver Function Tests: Recent Labs  Lab 09/26/17 1421 09/27/17 0441  AST 22 15  ALT 41 28  ALKPHOS 79 66  BILITOT 0.4 0.8  PROT 8.0 6.4*  ALBUMIN 4.7 3.8   Recent Labs  Lab 09/26/17 1421 09/27/17 0441  LIPASE 283* 208*   No results for input(s): AMMONIA in the last 168 hours. Coagulation Profile: No results for input(s): INR, PROTIME in the last 168 hours. Cardiac Enzymes: No results for  input(s): CKTOTAL, CKMB, CKMBINDEX, TROPONINI in the last 168 hours. BNP (last 3 results) No results for input(s): PROBNP in the last 8760 hours. HbA1C: No results for input(s): HGBA1C in the last 72 hours. CBG: No results for input(s): GLUCAP in the last 168 hours. Lipid Profile: No results for input(s): CHOL, HDL, LDLCALC, TRIG, CHOLHDL, LDLDIRECT in the last 72 hours. Thyroid Function Tests: No results for input(s): TSH, T4TOTAL, FREET4, T3FREE, THYROIDAB in the last 72 hours. Anemia Panel: No results for input(s): VITAMINB12, FOLATE, FERRITIN, TIBC, IRON, RETICCTPCT in the last 72 hours. Sepsis Labs: No results for input(s): PROCALCITON, LATICACIDVEN in the last 168 hours.  No results found for this or any previous visit (from the past 240 hour(s)).       Radiology Studies: Ct Abdomen Pelvis W Contrast  Result Date: 09/26/2017 CLINICAL DATA:  LEFT UPPER QUADRANT abdominal pain radiating into the back, 1 day post endoscopic ultrasound biopsy of a mass involving the pancreatic tail. EXAM: CT ABDOMEN AND PELVIS WITH CONTRAST TECHNIQUE: Multidetector CT imaging of the abdomen and pelvis was performed using the standard protocol following bolus administration of intravenous contrast. CONTRAST:  161mL ISOVUE-300 IOPAMIDOL INJECTION 61% IV. COMPARISON:  MRI abdomen 09/11/2017. CT abdomen 09/05/2017, 01/29/2016. FINDINGS: Lower chest: Heart size normal.  Visualized lung bases clear. Hepatobiliary: Liver normal in size and appearance. Gallbladder normal in appearance without calcified gallstones. No biliary ductal dilation. Pancreas: Mild edema/inflammation surrounding the body and tail of the pancreas, new since the most recent prior MRI and CT. The calcified heterogeneous soft tissue mass involving the pancreatic tail is unchanged, measuring approximately 3.8 x 3.2 cm (series 2, image 36). Remainder of the pancreas remains normal in appearance. Spleen: Normal in size and appearance.  Adrenals/Urinary Tract: Normal appearing adrenal glands. Kidneys normal in size and appearance without focal parenchymal abnormality. No evidence of urinary tract calculi or obstruction. Normal-appearing urinary bladder. Stomach/Bowel: Stomach normal in appearance for the degree of distention. Normal-appearing small bowel. Scattered sigmoid colon diverticula without evidence of acute diverticulitis. Remainder of the colon normal in appearance. Normal caliber appendix containing a small appendicolith, without evidence of periappendiceal inflammation. Vascular/Lymphatic: Aortoiliac atherosclerosis, advanced for patient age, without evidence of aneurysm. Normal-appearing portal venous and systemic venous systems. No pathologic lymphadenopathy. Reproductive: Prostate gland and seminal vesicles normal in size and appearance for age. Other: None. Musculoskeletal: Degenerative disc disease, spondylosis and facet degenerative changes at L5-S1. No acute findings. IMPRESSION: 1. Acute pancreatitis involving the body and tail of the pancreas. 2. Stable calcified mass involving the pancreatic tail. 3. No evidence of metastatic disease. 4.  Aortic Atherosclerosis, advanced for patient age.  (ICD10-170.0) Electronically Signed   By: Evangeline Dakin M.D.   On: 09/26/2017 17:07        Scheduled Meds: . sertraline  50 mg Oral Daily   Continuous Infusions: . sodium chloride 150 mL/hr at 09/27/17 0822     LOS: 1 day    Time  spent:25 mins. More than 50% of that time was spent in counseling and/or coordination of care.      Shelly Coss, MD [facility] Pager [phone]  If 7PM-7AM, please contact night-coverage [email or web address] Password [email or web address] 09/27/2017, 1:18 PM

## 2017-09-28 MED ORDER — OXYCODONE HCL 5 MG PO TABS
5.0000 mg | ORAL_TABLET | Freq: Once | ORAL | Status: AC
  Administered 2017-09-28: 5 mg via ORAL
  Filled 2017-09-28: qty 1

## 2017-09-28 NOTE — Progress Notes (Signed)
PROGRESS NOTE    Andre Simmons  LGX:[id number] DOB: 12/16/1975 DOA: 09/26/2017 PCP: Maryella Shivers, MD   Brief Narrative: Patient is a 42 year old male  with recent  finding of pancreatic mass who underwent biopsy of the pancreas few days ago presented with acute epigastric abdominal pain.  Patient found to have acute pancreatitis on CT scan with mild elevation of lipase.  Admitted for further management.  Assessment & Plan:   Principal Problem:   Acute pancreatitis Active Problems:   Pancreatic lesion   Chronic back pain  Acute pancreatitis: CT imaging suggestive of acute pancreatitis.  Patient presented with abdominal pain after biopsy.  No evidence of bleeding as per the imaging.  Lipase less around 200. Continue conservative management with bowel rest, IV fluids.  Started  him on clear liquid diet .  Medications for pain,nausea. Abdominal examination suggestive of  epigastric tenderness.  No evidence of infection or necrosis as per the CT imaging.    Pancreatic mass: Was following with Dr. Ardis Hughs, gastro enterology.  Recently had pancreatic biopsy.  Biopsy report pending.  Chronic back pain: Continue supportive care     DVT prophylaxis:SCD Code Status: Full Family Communication: None present at the bedside Disposition Plan: Home after resolution of pain   Consultants: None  Procedures: None  Antimicrobials: None  Subjective: Patient seen and examined the bedside this morning.  He still complains of significant pain.  Mild improvement in pain however from yesterday. no nausea or vomiting.  No fever overnight.  Objective: Vitals:   09/27/17 1330 09/27/17 1806 09/27/17 2012 09/28/17 0446  BP: 108/79  106/63 121/74  Pulse: 73  77 69  Resp: 16  12 12   Temp: 98.3 F (36.8 C)  100.1 F (37.8 C) 98.3 F (36.8 C)  TempSrc: Oral  Oral Oral  SpO2: 100% 98% 100% 97%  Weight:    102.8 kg (226 lb 10.1 oz)  Height:        Intake/Output Summary (Last 24 hours) at  09/28/2017 1222 Last data filed at 09/28/2017 0758 Gross per 24 hour  Intake 4437.48 ml  Output 1600 ml  Net 2837.48 ml   Filed Weights   09/26/17 2015 09/27/17 0403 09/28/17 0446  Weight: 98 kg (216 lb 0.8 oz) 98.2 kg (216 lb 7.9 oz) 102.8 kg (226 lb 10.1 oz)    Examination:  General exam:Not in obvious distress,average built HEENT:PERRL,Oral mucosa moist, Ear/Nose normal on gross exam Respiratory system: Bilateral equal air entry, normal vesicular breath sounds, no wheezes or crackles  Cardiovascular system: S1 & S2 heard, RRR. No JVD, murmurs, rubs, gallops or clicks. No pedal edema. Gastrointestinal system: Abdomen is nondistended, soft .  Tenderness in the epigastric region. No organomegaly or masses felt. Normal bowel sounds heard. Central nervous system: Alert and oriented. No focal neurological deficits. Extremities: No edema, no clubbing ,no cyanosis, distal peripheral pulses palpable. Skin: No rashes, lesions or ulcers,no icterus ,no pallor MSK: Normal muscle bulk,tone ,power Psychiatry: Judgement and insight appear normal. Mood & affect appropriate.     Data Reviewed: I have personally reviewed following labs and imaging studies  CBC: Recent Labs  Lab 09/26/17 1421 09/27/17 0441  WBC 9.1 9.2  HGB 14.9 13.2  HCT 43.5 38.8*  MCV 93.3 94.6  PLT 295 814   Basic Metabolic Panel: Recent Labs  Lab 09/26/17 1421 09/27/17 0441  NA 139 141  K 4.2 3.6  CL 105 108  CO2 23 25  GLUCOSE 111* 94  BUN 12 9  CREATININE 0.82 0.74  CALCIUM 9.3 8.6*   GFR: Estimated Creatinine Clearance: 157.8 mL/min (by C-G formula based on SCr of 0.74 mg/dL). Liver Function Tests: Recent Labs  Lab 09/26/17 1421 09/27/17 0441  AST 22 15  ALT 41 28  ALKPHOS 79 66  BILITOT 0.4 0.8  PROT 8.0 6.4*  ALBUMIN 4.7 3.8   Recent Labs  Lab 09/26/17 1421 09/27/17 0441  LIPASE 283* 208*   No results for input(s): AMMONIA in the last 168 hours. Coagulation Profile: No results for  input(s): INR, PROTIME in the last 168 hours. Cardiac Enzymes: No results for input(s): CKTOTAL, CKMB, CKMBINDEX, TROPONINI in the last 168 hours. BNP (last 3 results) No results for input(s): PROBNP in the last 8760 hours. HbA1C: No results for input(s): HGBA1C in the last 72 hours. CBG: No results for input(s): GLUCAP in the last 168 hours. Lipid Profile: No results for input(s): CHOL, HDL, LDLCALC, TRIG, CHOLHDL, LDLDIRECT in the last 72 hours. Thyroid Function Tests: No results for input(s): TSH, T4TOTAL, FREET4, T3FREE, THYROIDAB in the last 72 hours. Anemia Panel: No results for input(s): VITAMINB12, FOLATE, FERRITIN, TIBC, IRON, RETICCTPCT in the last 72 hours. Sepsis Labs: No results for input(s): PROCALCITON, LATICACIDVEN in the last 168 hours.  No results found for this or any previous visit (from the past 240 hour(s)).       Radiology Studies: Ct Abdomen Pelvis W Contrast  Result Date: 09/26/2017 CLINICAL DATA:  LEFT UPPER QUADRANT abdominal pain radiating into the back, 1 day post endoscopic ultrasound biopsy of a mass involving the pancreatic tail. EXAM: CT ABDOMEN AND PELVIS WITH CONTRAST TECHNIQUE: Multidetector CT imaging of the abdomen and pelvis was performed using the standard protocol following bolus administration of intravenous contrast. CONTRAST:  140mL ISOVUE-300 IOPAMIDOL INJECTION 61% IV. COMPARISON:  MRI abdomen 09/11/2017. CT abdomen 09/05/2017, 01/29/2016. FINDINGS: Lower chest: Heart size normal.  Visualized lung bases clear. Hepatobiliary: Liver normal in size and appearance. Gallbladder normal in appearance without calcified gallstones. No biliary ductal dilation. Pancreas: Mild edema/inflammation surrounding the body and tail of the pancreas, new since the most recent prior MRI and CT. The calcified heterogeneous soft tissue mass involving the pancreatic tail is unchanged, measuring approximately 3.8 x 3.2 cm (series 2, image 36). Remainder of the pancreas  remains normal in appearance. Spleen: Normal in size and appearance. Adrenals/Urinary Tract: Normal appearing adrenal glands. Kidneys normal in size and appearance without focal parenchymal abnormality. No evidence of urinary tract calculi or obstruction. Normal-appearing urinary bladder. Stomach/Bowel: Stomach normal in appearance for the degree of distention. Normal-appearing small bowel. Scattered sigmoid colon diverticula without evidence of acute diverticulitis. Remainder of the colon normal in appearance. Normal caliber appendix containing a small appendicolith, without evidence of periappendiceal inflammation. Vascular/Lymphatic: Aortoiliac atherosclerosis, advanced for patient age, without evidence of aneurysm. Normal-appearing portal venous and systemic venous systems. No pathologic lymphadenopathy. Reproductive: Prostate gland and seminal vesicles normal in size and appearance for age. Other: None. Musculoskeletal: Degenerative disc disease, spondylosis and facet degenerative changes at L5-S1. No acute findings. IMPRESSION: 1. Acute pancreatitis involving the body and tail of the pancreas. 2. Stable calcified mass involving the pancreatic tail. 3. No evidence of metastatic disease. 4.  Aortic Atherosclerosis, advanced for patient age.  (ICD10-170.0) Electronically Signed   By: Evangeline Dakin M.D.   On: 09/26/2017 17:07        Scheduled Meds: . sertraline  50 mg Oral Daily   Continuous Infusions: . sodium chloride 1,000 mL (09/28/17 1032)  LOS: 2 days    Time spent:25 mins. More than 50% of that time was spent in counseling and/or coordination of care.      Shelly Coss, MD [facility] Pager [phone]  If 7PM-7AM, please contact night-coverage [email or web address] Password [email or web address] 09/28/2017, 12:22 PM

## 2017-09-28 NOTE — Progress Notes (Signed)
Pt states he is having abdominal pain rating it a 10, explained I would get his Dilaudid medication. He stated in 30 minutes then he wanted the percocet. That's how everyone has been medicating him. The MD wants him to get it this way.I explained that's not how the order is written. After he gets the Dilaudid then I would check with him to see what his pain number is. That he will need to ask for the Percocet because it is a PRN not scheduled. Pt angry stating I was 2 hours late with his Percocet, that he had asked for it when I gave him the Dilaudid. I again explained to him he had to ask for it when he needed it not when he wanted it.Pt angry stating everyone but me had been medicating him like that. Explained again that's not how the order is written.

## 2017-09-28 NOTE — Progress Notes (Signed)
Pt had called for pain medicine and I was in with another patient, he had called a few times and he wasn't due yet. When I went in his mother was in the room and the patient started complaining again about how he is getting his medication. I explained he wasn't due yet and that I was in with another patient who had fallen in the shower. He wanted to know why I didn't get someone else to give him pain medicine. I explained I was with another patient that had fallen and was speaking to the MD. Patient states he no longer wanted me as a nurse and to get out of his room(pt was swearing at me). I left his room and went to speak to the charge  Nurse and he came right out after me wanting the pain medication. I explained he was going to get a new nurse, and Beverlee Nims went into his room to speak to them. Festus Holts medicated him and took over his care.

## 2017-09-29 LAB — COMPREHENSIVE METABOLIC PANEL
ALBUMIN: 3.6 g/dL (ref 3.5–5.0)
ALK PHOS: 68 U/L (ref 38–126)
ALT: 20 U/L (ref 0–44)
ANION GAP: 11 (ref 5–15)
AST: 15 U/L (ref 15–41)
BUN: 9 mg/dL (ref 6–20)
CO2: 24 mmol/L (ref 22–32)
CREATININE: 0.74 mg/dL (ref 0.61–1.24)
Calcium: 8.7 mg/dL — ABNORMAL LOW (ref 8.9–10.3)
Chloride: 102 mmol/L (ref 98–111)
GFR calc Af Amer: >60 mL/min (ref >60)
GFR calc non Af Amer: >60 mL/min (ref >60)
GLUCOSE: 94 mg/dL (ref 70–99)
Potassium: 4 mmol/L (ref 3.5–5.1)
SODIUM: 137 mmol/L (ref 135–145)
Total Bilirubin: 0.7 mg/dL (ref 0.3–1.2)
Total Protein: 7 g/dL (ref 6.5–8.1)

## 2017-09-29 LAB — LIPASE, BLOOD: Lipase: 42 U/L (ref 11–51)

## 2017-09-29 MED ORDER — HYDROMORPHONE HCL 1 MG/ML IJ SOLN
0.5000 mg | INTRAMUSCULAR | Status: DC | PRN
  Administered 2017-09-29 – 2017-09-30 (×10): 0.5 mg via INTRAVENOUS
  Filled 2017-09-29 (×10): qty 1

## 2017-09-29 NOTE — Progress Notes (Signed)
PROGRESS NOTE    Andre Simmons  BXI:[id number] DOB: 11/30/1975 DOA: 09/26/2017 PCP: Maryella Shivers, MD   Brief Narrative: Patient is a 42 year old male  with recent  finding of pancreatic mass who underwent biopsy of the pancreas few days ago presented with acute epigastric abdominal pain.  Patient found to have acute pancreatitis on CT scan with mild elevation of lipase.  Admitted for further management. Patient has been treated with conservative management with IV fluids, pain medications and bowel rest.  Pain has somewhat improved today.  Diet will be advanced   Assessment & Plan:   Principal Problem:   Acute pancreatitis Active Problems:   Pancreatic lesion   Chronic back pain  Acute pancreatitis: CT imaging suggestive of acute pancreatitis.  Patient presented with abdominal pain after biopsy.  No evidence of bleeding as per the imaging.   Pain has improved somewhat today.  Diet would be advanced .lipase normalized .Continue  IV fluids.  Medications for pain,nausea. Abdominal examination suggestive of  epigastric tenderness.  No evidence of infection or necrosis as per the CT imaging.   Plan for discharge tomorrow to home.  Pancreatic mass: Was following with Dr. Ardis Hughs, gastro enterology.  Recently had pancreatic biopsy.  Biopsy report pending.  Chronic back pain: Continue supportive care     DVT prophylaxis:SCD Code Status: Full Family Communication: None present at the bedside Disposition Plan: DC tomorrow to home   Consultants: None  Procedures: None  Antimicrobials: None  Subjective: Patient seen and examined the bedside this morning.  He still complains of  pain.  Mild improvement in pain however from yesterday. no nausea or vomiting.  No fever overnight. We discussed about advancing the diet today and plan for going home tomorrow.  Objective: Vitals:   09/28/17 0446 09/28/17 1342 09/28/17 2129 09/29/17 0515  BP: 121/74 117/82 123/79 118/87  Pulse: 69  84 77 71  Resp: 12 18 17 17   Temp: 98.3 F (36.8 C) 98.9 F (37.2 C) 98.5 F (36.9 C) 98.2 F (36.8 C)  TempSrc: Oral Oral Oral Oral  SpO2: 97% 97% 97% 99%  Weight: 102.8 kg (226 lb 10.1 oz)   102.1 kg (225 lb 1.4 oz)  Height:        Intake/Output Summary (Last 24 hours) at 09/29/2017 1303 Last data filed at 09/29/2017 0600 Gross per 24 hour  Intake 3241.69 ml  Output -  Net 3241.69 ml   Filed Weights   09/27/17 0403 09/28/17 0446 09/29/17 0515  Weight: 98.2 kg (216 lb 7.9 oz) 102.8 kg (226 lb 10.1 oz) 102.1 kg (225 lb 1.4 oz)    Examination:  General exam:Not in obvious distress,average built HEENT:PERRL,Oral mucosa moist, Ear/Nose normal on gross exam Respiratory system: Bilateral equal air entry, normal vesicular breath sounds, no wheezes or crackles  Cardiovascular system: S1 & S2 heard, RRR. No JVD, murmurs, rubs, gallops or clicks. No pedal edema. Gastrointestinal system: Abdomen is nondistended, soft .  Tenderness in the epigastric region. No organomegaly or masses felt. Normal bowel sounds heard. Central nervous system: Alert and oriented. No focal neurological deficits. Extremities: No edema, no clubbing ,no cyanosis, distal peripheral pulses palpable. Skin: No rashes, lesions or ulcers,no icterus ,no pallor MSK: Normal muscle bulk,tone ,power Psychiatry: Judgement and insight appear normal. Mood & affect appropriate.     Data Reviewed: I have personally reviewed following labs and imaging studies  CBC: Recent Labs  Lab 09/26/17 1421 09/27/17 0441  WBC 9.1 9.2  HGB 14.9 13.2  HCT  43.5 38.8*  MCV 93.3 94.6  PLT 295 655   Basic Metabolic Panel: Recent Labs  Lab 09/26/17 1421 09/27/17 0441 09/29/17 0511  NA 139 141 137  K 4.2 3.6 4.0  CL 105 108 102  CO2 23 25 24   GLUCOSE 111* 94 94  BUN 12 9 9   CREATININE 0.82 0.74 0.74  CALCIUM 9.3 8.6* 8.7*   GFR: Estimated Creatinine Clearance: 157.3 mL/min (by C-G formula based on SCr of 0.74 mg/dL). Liver  Function Tests: Recent Labs  Lab 09/26/17 1421 09/27/17 0441 09/29/17 0511  AST 22 15 15   ALT 41 28 20  ALKPHOS 79 66 68  BILITOT 0.4 0.8 0.7  PROT 8.0 6.4* 7.0  ALBUMIN 4.7 3.8 3.6   Recent Labs  Lab 09/26/17 1421 09/27/17 0441 09/29/17 0511  LIPASE 283* 208* 42   No results for input(s): AMMONIA in the last 168 hours. Coagulation Profile: No results for input(s): INR, PROTIME in the last 168 hours. Cardiac Enzymes: No results for input(s): CKTOTAL, CKMB, CKMBINDEX, TROPONINI in the last 168 hours. BNP (last 3 results) No results for input(s): PROBNP in the last 8760 hours. HbA1C: No results for input(s): HGBA1C in the last 72 hours. CBG: No results for input(s): GLUCAP in the last 168 hours. Lipid Profile: No results for input(s): CHOL, HDL, LDLCALC, TRIG, CHOLHDL, LDLDIRECT in the last 72 hours. Thyroid Function Tests: No results for input(s): TSH, T4TOTAL, FREET4, T3FREE, THYROIDAB in the last 72 hours. Anemia Panel: No results for input(s): VITAMINB12, FOLATE, FERRITIN, TIBC, IRON, RETICCTPCT in the last 72 hours. Sepsis Labs: No results for input(s): PROCALCITON, LATICACIDVEN in the last 168 hours.  No results found for this or any previous visit (from the past 240 hour(s)).       Radiology Studies: No results found.      Scheduled Meds: . sertraline  50 mg Oral Daily   Continuous Infusions: . sodium chloride 100 mL/hr at 09/29/17 0828     LOS: 3 days    Time spent:25 mins. More than 50% of that time was spent in counseling and/or coordination of care.      Shelly Coss, MD [facility] Pager [phone]  If 7PM-7AM, please contact night-coverage [email or web address] Password [email or web address] 09/29/2017, 1:03 PM

## 2017-09-29 NOTE — Care Management Important Message (Signed)
Important Message  Patient Details  Name: [name] MRN: [id number] Date of Birth: Feb 01, 1976   Medicare Important Message Given:  Yes    Kerin Salen 09/29/2017, 12:24 [place] Message  Patient Details  Name: Andre Simmons MRN: [id number] Date of Birth: Dec 17, 1975   Medicare Important Message Given:  Yes    Kerin Salen 09/29/2017, 12:24 PM

## 2017-09-29 NOTE — Progress Notes (Signed)
Nutrition Brief Note  Patient identified on the Malnutrition Screening Tool (MST) Report  No weight loss per weight records.  Wt Readings from Last 15 Encounters:  09/29/17 225 lb 1.4 oz (102.1 kg)  09/25/17 216 lb (98 kg)  09/18/17 221 lb (100.2 kg)  09/06/17 218 lb 14.7 oz (99.3 kg)  08/15/11 225 lb (102.1 kg)    Body mass index is 28.13 kg/m. Patient meets criteria for overweight based on current BMI.   Current diet order is soft,diet just advanced today. Labs and medications reviewed.   No nutrition interventions warranted at this time. If nutrition issues arise, please consult RD.   Clayton Bibles, MS, RD, [place] [facility] Pager: [phone] After Hours Pager: [phone]

## 2017-09-30 ENCOUNTER — Inpatient Hospital Stay (HOSPITAL_COMMUNITY): Payer: Medicare Other

## 2017-09-30 DIAGNOSIS — R634 Abnormal weight loss: Secondary | ICD-10-CM

## 2017-09-30 DIAGNOSIS — R1012 Left upper quadrant pain: Secondary | ICD-10-CM

## 2017-09-30 MED ORDER — HYDROMORPHONE HCL 1 MG/ML IJ SOLN
1.0000 mg | INTRAMUSCULAR | Status: DC | PRN
  Administered 2017-09-30 – 2017-10-04 (×29): 1 mg via INTRAVENOUS
  Filled 2017-09-30 (×31): qty 1

## 2017-09-30 MED ORDER — IOPAMIDOL (ISOVUE-300) INJECTION 61%
30.0000 mL | Freq: Once | INTRAVENOUS | Status: AC | PRN
  Administered 2017-09-30: 30 mL via ORAL

## 2017-09-30 MED ORDER — IOPAMIDOL (ISOVUE-300) INJECTION 61%
INTRAVENOUS | Status: AC
  Administered 2017-09-30: 30 mL
  Filled 2017-09-30: qty 30

## 2017-09-30 MED ORDER — IOHEXOL 300 MG/ML  SOLN
100.0000 mL | Freq: Once | INTRAMUSCULAR | Status: AC | PRN
  Administered 2017-09-30: 100 mL via INTRAVENOUS

## 2017-09-30 NOTE — Progress Notes (Signed)
PROGRESS NOTE    Andre Simmons  BDZ:[id number] DOB: February 28, 1975 DOA: 09/26/2017 PCP: Maryella Shivers, MD   Brief Narrative: Patient is a 42 year old male  with recent  finding of pancreatic mass who underwent biopsy of the pancreas few days ago presented with acute epigastric abdominal pain.  Patient found to have acute pancreatitis on CT scan with mild elevation of lipase.  Admitted for further management. Patient has been treated with conservative management with IV fluids, pain medications and bowel rest.  Diet advanced but he continues to complain of pain ,so gastroenterology was consulted today.  Assessment & Plan:   Principal Problem:   Acute pancreatitis Active Problems:   Pancreatic lesion   Chronic back pain  Acute pancreatitis: CT imaging suggestive of acute pancreatitis.  Patient presented with abdominal pain after biopsy.  No evidence of bleeding as per the imaging.   Diet advanced but he could not tolerate and continues to have persistent pain . GI consulted today. Planning for CT of the abdomen with pancreatic protocol.  Lipase normalized .Continue  IV fluids.  Medications for pain,nausea. Abdominal examination suggestive of  epigastric tenderness.  No evidence of infection or necrosis as per the CT imaging.   Pancreatic mass: Was following with Dr. Ardis Hughs, gastro enterology.Has calcified mass on the pancreatic tail.  Recently had pancreatic biopsy.  Biopsy report pending.  He is also being followed by surgery as an outpatient by Dr. Barry Dienes for possible surgical resection.  Chronic back pain: Continue supportive care   DVT prophylaxis:SCD Code Status: Full Family Communication: None present at the bedside Disposition Plan: Home after resolution of pain   Consultants: None  Procedures: None  Antimicrobials: None  Subjective: Patient seen and examined the bedside this morning.  He still complains of  pain.  He could not tolerate the soft diet .GI consulted  today.  No nausea or vomiting  Objective: Vitals:   09/29/17 0515 09/29/17 1435 09/29/17 2139 09/30/17 0507  BP: 118/87 122/78 122/88 107/61  Pulse: 71 66 60 (!) 58  Resp: 17 17 16 20   Temp: 98.2 F (36.8 C) 98.1 F (36.7 C) 98.5 F (36.9 C) 98 F (36.7 C)  TempSrc: Oral Oral Oral Oral  SpO2: 99% 97% 100% 96%  Weight: 102.1 kg (225 lb 1.4 oz)   98.5 kg (217 lb 2.5 oz)  Height:        Intake/Output Summary (Last 24 hours) at 09/30/2017 1437 Last data filed at 09/30/2017 0531 Gross per 24 hour  Intake 2827.54 ml  Output -  Net 2827.54 ml   Filed Weights   09/28/17 0446 09/29/17 0515 09/30/17 0507  Weight: 102.8 kg (226 lb 10.1 oz) 102.1 kg (225 lb 1.4 oz) 98.5 kg (217 lb 2.5 oz)    Examination:  General exam:In mild distress due to abdominal pain ,average built HEENT:PERRL,Oral mucosa moist, Ear/Nose normal on gross exam Respiratory system: Bilateral equal air entry, normal vesicular breath sounds, no wheezes or crackles  Cardiovascular system: S1 & S2 heard, RRR. No JVD, murmurs, rubs, gallops or clicks. Gastrointestinal system: Abdomen is nondistended, soft .  Tenderness in the epigastric region no organomegaly or masses felt. Normal bowel sounds heard. Central nervous system: Alert and oriented. No focal neurological deficits. Extremities: No edema, no clubbing ,no cyanosis, distal peripheral pulses palpable. Skin: No rashes, lesions or ulcers,no icterus ,no pallor MSK: Normal muscle bulk,tone ,power Psychiatry: Judgement and insight appear normal. Mood & affect appropriate.       Data Reviewed: I have  personally reviewed following labs and imaging studies  CBC: Recent Labs  Lab 09/26/17 1421 09/27/17 0441  WBC 9.1 9.2  HGB 14.9 13.2  HCT 43.5 38.8*  MCV 93.3 94.6  PLT 295 226   Basic Metabolic Panel: Recent Labs  Lab 09/26/17 1421 09/27/17 0441 09/29/17 0511  NA 139 141 137  K 4.2 3.6 4.0  CL 105 108 102  CO2 23 25 24   GLUCOSE 111* 94 94  BUN 12 9  9   CREATININE 0.82 0.74 0.74  CALCIUM 9.3 8.6* 8.7*   GFR: Estimated Creatinine Clearance: 145.2 mL/min (by C-G formula based on SCr of 0.74 mg/dL). Liver Function Tests: Recent Labs  Lab 09/26/17 1421 09/27/17 0441 09/29/17 0511  AST 22 15 15   ALT 41 28 20  ALKPHOS 79 66 68  BILITOT 0.4 0.8 0.7  PROT 8.0 6.4* 7.0  ALBUMIN 4.7 3.8 3.6   Recent Labs  Lab 09/26/17 1421 09/27/17 0441 09/29/17 0511  LIPASE 283* 208* 42   No results for input(s): AMMONIA in the last 168 hours. Coagulation Profile: No results for input(s): INR, PROTIME in the last 168 hours. Cardiac Enzymes: No results for input(s): CKTOTAL, CKMB, CKMBINDEX, TROPONINI in the last 168 hours. BNP (last 3 results) No results for input(s): PROBNP in the last 8760 hours. HbA1C: No results for input(s): HGBA1C in the last 72 hours. CBG: No results for input(s): GLUCAP in the last 168 hours. Lipid Profile: No results for input(s): CHOL, HDL, LDLCALC, TRIG, CHOLHDL, LDLDIRECT in the last 72 hours. Thyroid Function Tests: No results for input(s): TSH, T4TOTAL, FREET4, T3FREE, THYROIDAB in the last 72 hours. Anemia Panel: No results for input(s): VITAMINB12, FOLATE, FERRITIN, TIBC, IRON, RETICCTPCT in the last 72 hours. Sepsis Labs: No results for input(s): PROCALCITON, LATICACIDVEN in the last 168 hours.  No results found for this or any previous visit (from the past 240 hour(s)).       Radiology Studies: No results found.      Scheduled Meds: . sertraline  50 mg Oral Daily   Continuous Infusions: . sodium chloride 100 mL/hr at 09/30/17 0531     LOS: 4 days    Time spent:25 mins. More than 50% of that time was spent in counseling and/or coordination of care.      Shelly Coss, MD [facility] Pager [phone]  If 7PM-7AM, please contact night-coverage [email or web address] Password [facility] 09/30/2017, 2:37 PM

## 2017-09-30 NOTE — Consult Note (Addendum)
Consultation  Referring Provider: Dr. Tawanna Solo     Primary Care Physician:  Maryella Shivers, MD Primary Gastroenterologist:  Dr. Ardis Hughs       Reason for Consultation: Recurrent Pancreatitis             HPI:   Andre Simmons is a 42 y.o. male with recent finding of a pancreatic mass who underwent EUS with FNA 09/25/2017 with Dr. Ardis Hughs and presented to the ER on 09/26/2017 with acute epigastric abdominal pain.  He was found to have acute pancreatitis on CT scan with mild elevation of lipase and was admitted.  We were consulted as patient still is complaining of pain today on day 4 of his hospitalization and is requesting to be seen by our service.    Today, explains that his pain has not gotten any better and may be somewhat worse since his admission.  Continues with a 8-9/10 pain in his epigastrium which is new since time of his EUS and his chronic left-sided pain that is thought related to his pancreatic tumor.  Apparently is able to tolerate only clear liquids, per him this was increased to a regular diet yesterday and he tried a chicken sandwich which he immediately vomited.  He continues with nausea needing Zofran every 6 hours.  Also tells me his pain meds were decreased yesterday, but this is "not cutting it".  Does ask about possibly seeing surgery during his stay here, Dr. Barry Dienes is who he was referred to.  Describes continued weight loss, especially now that he is not able to tolerate anything.    Denies fever, chills, blood in his stool, heartburn or reflux.  GI history: 09/25/2017 EUS-FNA with Dr. 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 Chronic back pain   . GERD (gastroesophageal reflux  disease)   . Knee pain     Past Surgical History:  Procedure Laterality Date  . BACK SURGERY     L4-L5, S1 micro discectomy  . EUS N/A 09/25/2017   Procedure: UPPER ENDOSCOPIC ULTRASOUND (EUS) LINEAR;  Surgeon: Milus Banister, MD;  Location: [facility];  Service: Endoscopy;  Laterality: N/A;  Radial and Linear  . FINE NEEDLE ASPIRATION  09/25/2017   Procedure: FINE NEEDLE ASPIRATION (FNA) RADIAL;  Surgeon: Milus Banister, MD;  Location: [facility];  Service: Endoscopy;;  . KNEE SURGERY Left    x 20 surgeries  . MOUTH SURGERY     as a child, unsure if wisdom teeth or tonsils    Family History  Problem Relation Age of Onset  . Thyroid cancer Mother   . Other Father        MVA at age 88  . Esophageal cancer Maternal Grandmother        smoker  . Breast cancer Maternal Grandmother   . Esophageal cancer Paternal Grandmother   . Colon cancer Neg Hx   . Rectal cancer Neg Hx      Social History   Tobacco Use  . Smoking status: Never Smoker  . Smokeless tobacco: Never Used  Substance Use Topics  . Alcohol use: No  . Drug use: No    Prior to Admission medications   Medication Sig Start Date End Date Taking?  Authorizing Provider  omeprazole (PRILOSEC) 40 MG capsule Take 40 mg by mouth 2 (two) times daily.    Yes [provider]  oxyCODONE-acetaminophen (PERCOCET) 5-325 MG tablet Take 1 tablet by mouth every 6 (six) hours as needed for severe pain. 09/06/17  Yes Molpus, John, MD  promethazine (PHENERGAN) 12.5 MG tablet Take 1 tablet (12.5 mg total) by mouth every 8 (eight) hours as needed for nausea or vomiting. 09/18/17  Yes Zehr, Laban Emperor, PA-C  sertraline (ZOLOFT) 50 MG tablet Take 50 mg by mouth daily.   Yes [provider]  ondansetron (ZOFRAN ODT) 8 MG disintegrating tablet Take 1 tablet (8 mg total) by mouth every 8 (eight) hours as needed for nausea or vomiting. Patient not taking: Reported on 09/26/2017 09/06/17   Molpus, Jenny Reichmann, MD    Current  Facility-Administered Medications  Medication Dose Route Frequency Provider Last Rate Last Dose  . 0.9 %  sodium chloride infusion   Intravenous Continuous Shelly Coss, MD 100 mL/hr at 09/30/17 0531    . acetaminophen (TYLENOL) tablet 650 mg  650 mg Oral Q6H PRN Lovey Newcomer T, NP   650 mg at 09/28/17 0008  . HYDROmorphone (DILAUDID) injection 0.5 mg  0.5 mg Intravenous Q3H PRN Shelly Coss, MD   0.5 mg at 09/30/17 1038  . ondansetron (ZOFRAN) tablet 4 mg  4 mg Oral Q6H PRN Phillips Grout, MD       Or  . ondansetron [facility]) injection 4 mg  4 mg Intravenous Q6H PRN Phillips Grout, MD   4 mg at 09/29/17 2136  . oxyCODONE-acetaminophen (PERCOCET/ROXICET) 5-325 MG per tablet 1 tablet  1 tablet Oral Q6H PRN Phillips Grout, MD   1 tablet at 09/30/17 0507  . sertraline (ZOLOFT) tablet 50 mg  50 mg Oral Daily Derrill Kay A, MD   50 mg at 09/30/17 6314    Allergies as of 09/26/2017  . (No Known Allergies)     Review of Systems:    Constitutional: No fever or chills Skin: No rash  Cardiovascular: No chest pain Respiratory: No SOB Gastrointestinal: See HPI and otherwise negative Genitourinary: No dysuria  Neurological: No headache, dizziness or syncope Musculoskeletal: No new muscle or joint pain Hematologic: No bleeding  Psychiatric: No history of depression or anxiety    Physical Exam:  Vital signs in last 24 hours: Temp:  [98 F (36.7 C)-98.5 F (36.9 C)] 98 F (36.7 C) (08/06 0507) Pulse Rate:  [58-66] 58 (08/06 0507) Resp:  [16-20] 20 (08/06 0507) BP: (107-122)/(61-88) 107/61 (08/06 0507) SpO2:  [96 %-100 %] 96 % (08/06 0507) Weight:  [217 lb 2.5 oz (98.5 kg)] 217 lb 2.5 oz (98.5 kg) (08/06 0507) Last BM Date: 09/25/17 General:   Pleasant Caucasian male appears to be in NAD, Well developed, Well nourished, alert and cooperative Head:  Normocephalic and atraumatic. Eyes:   PEERL, EOMI. No icterus. Conjunctiva pink. Ears:  Normal auditory acuity. Neck:   Supple Throat: Oral cavity and pharynx without inflammation, swelling or lesion.  Lungs: Respirations even and unlabored. Lungs clear to auscultation bilaterally.   No wheezes, crackles, or rhonchi.  Heart: Normal S1, S2. No MRG. Regular rate and rhythm. No peripheral edema, cyanosis or pallor.  Abdomen:  Soft, nondistended, mild epigastric ttp, No rebound or guarding. Normal bowel sounds. No appreciable masses or hepatomegaly. Rectal:  Not performed.  Msk:  Symmetrical without gross deformities. Peripheral pulses intact.  Extremities:  Without edema, no deformity or joint abnormality.  Neurologic:  Alert and  oriented x4;  grossly normal neurologically.   Skin:   Dry and intact without significant lesions or rashes. Psychiatric: Demonstrates good judgement and reason without abnormal affect or behaviors.   LAB RESULTS: BMET Recent Labs    09/29/17 0511  NA 137  K 4.0  CL 102  CO2 24  GLUCOSE 94  BUN 9  CREATININE 0.74  CALCIUM 8.7*   LFT Recent Labs    09/29/17 0511  PROT 7.0  ALBUMIN 3.6  AST 15  ALT 20  ALKPHOS 68  BILITOT 0.7    Impression / Plan:   Impression: 1.  Acute pancreatitis: CT suggesting acute pancreatitis on 09/26/2017, recent EUS with FNA 09/25/2017, pain is not improving, lipase has normalized, patient unable to tolerate diet yesterday, okay with clears 2.  Pancreatic mass: Recent EUS with FNA Dr. Ardis Hughs 09/25/2017, needs surgical resection with Dr. Barry Dienes  Plan: 1.  Discussed case with surgery today.  Dr. Barry Dienes is not on-call this week.  Patient's appointment was set for September 3rd.  They went ahead and moved this up to 09/09/2017.  Patient has an 11:30 appointment, needs to arrive at 11:00.  I did place this in discharge follow-up. 2.  Advance patient to only full liquids today to see how he does.  Explained that if he has pain or nausea he needs to back down to clears. 3.  Patient will need increase in pain medication 4.  Continue scheduled  antiemetics 5.  Patient may require more fluids 6.  Will repeat CT of the abdomen/pelvis with pancreatic protocol  7.  Please await any further recommendations from Dr. Lyndel Safe later today  Thank you for your kind consultation, we will continue to follow.  Lavone Nian [facility]  09/30/2017, 11:33 AM   Attending physician's note   I have taken an interval history, reviewed the chart and examined the patient. I agree with the Advanced Practitioner's note, impression and recommendations.   42 year old with LUQ abdominal pain, 30lb weight loss, Dx with 3.5cm PNET in TOP on EUS with FNA 11/03/2639 complicated by mild pancreatitis. PD was normal on EUS and CT. Plan:  repeat CT scan: Pancreatic protocol today.  Pain is out of proportion to physical and CT findings. Per pt some pain has been there prior to EUS.  Surgery appointment has been moved up.  Continue clear liquids.  Advance to full liquids in a.m.Marland Kitchen Pain control with Dilaudid.  Carmell Austria, MD

## 2017-10-01 DIAGNOSIS — E876 Hypokalemia: Secondary | ICD-10-CM

## 2017-10-01 LAB — CBC WITH DIFFERENTIAL/PLATELET
BASOS PCT: 0 %
Basophils Absolute: 0 10*3/uL (ref 0.0–0.1)
EOS ABS: 0.2 10*3/uL (ref 0.0–0.7)
EOS PCT: 2 %
HCT: 37.7 % — ABNORMAL LOW (ref 39.0–52.0)
HEMOGLOBIN: 12.8 g/dL — AB (ref 13.0–17.0)
Lymphocytes Relative: 16 %
Lymphs Abs: 1.2 10*3/uL (ref 0.7–4.0)
MCH: 32.1 pg (ref 26.0–34.0)
MCHC: 34 g/dL (ref 30.0–36.0)
MCV: 94.5 fL (ref 78.0–100.0)
Monocytes Absolute: 0.6 10*3/uL (ref 0.1–1.0)
Monocytes Relative: 7 %
NEUTROS PCT: 75 %
Neutro Abs: 5.9 10*3/uL (ref 1.7–7.7)
PLATELETS: 283 10*3/uL (ref 150–400)
RBC: 3.99 MIL/uL — AB (ref 4.22–5.81)
RDW: 12.5 % (ref 11.5–15.5)
WBC: 7.8 10*3/uL (ref 4.0–10.5)

## 2017-10-01 LAB — BASIC METABOLIC PANEL
Anion gap: 10 (ref 5–15)
BUN: 8 mg/dL (ref 6–20)
CHLORIDE: 103 mmol/L (ref 98–111)
CO2: 28 mmol/L (ref 22–32)
CREATININE: 0.87 mg/dL (ref 0.61–1.24)
Calcium: 8.8 mg/dL — ABNORMAL LOW (ref 8.9–10.3)
Glucose, Bld: 94 mg/dL (ref 70–99)
Potassium: 3.4 mmol/L — ABNORMAL LOW (ref 3.5–5.1)
SODIUM: 141 mmol/L (ref 135–145)

## 2017-10-01 LAB — MAGNESIUM: MAGNESIUM: 1.9 mg/dL (ref 1.7–2.4)

## 2017-10-01 MED ORDER — POTASSIUM CHLORIDE IN NACL 40-0.9 MEQ/L-% IV SOLN
INTRAVENOUS | Status: DC
  Administered 2017-10-01 (×2): 125 mL/h via INTRAVENOUS
  Administered 2017-10-02: 20 mL/h via INTRAVENOUS
  Administered 2017-10-02 – 2017-10-05 (×9): 125 mL/h via INTRAVENOUS
  Filled 2017-10-01 (×15): qty 1000

## 2017-10-01 MED ORDER — PANTOPRAZOLE SODIUM 40 MG PO TBEC
80.0000 mg | DELAYED_RELEASE_TABLET | Freq: Every day | ORAL | Status: DC
  Administered 2017-10-01 – 2017-10-05 (×5): 80 mg via ORAL
  Filled 2017-10-01 (×5): qty 2

## 2017-10-01 MED ORDER — POTASSIUM CHLORIDE 20 MEQ/15ML (10%) PO SOLN
40.0000 meq | Freq: Once | ORAL | Status: AC
Start: 2017-10-01 — End: 2017-10-01
  Administered 2017-10-01: 40 meq via ORAL
  Filled 2017-10-01: qty 30

## 2017-10-01 MED ORDER — OXYCODONE-ACETAMINOPHEN 5-325 MG PO TABS
1.0000 | ORAL_TABLET | ORAL | Status: DC | PRN
  Administered 2017-10-01 – 2017-10-03 (×7): 1 via ORAL
  Filled 2017-10-01 (×8): qty 1

## 2017-10-01 NOTE — Progress Notes (Addendum)
Progress Note   Subjective  Chief Complaint: Acute pancreatitis after EUS with FNA on 09/25/2017  Patient reports that the increase in Dilaudid did help with his pain overnight though this seems to wear off about 2-1/2 hours after given.  Continues with abdominal pain left side and epigastrium/left upper quadrant.  Did have a bowel movement this morning and was able to tolerate a few "full liquids" such as orange sherbet and cream of mushroom soup yesterday.  Did have some nausea after but no pain.   Objective   Vital signs in last 24 hours: Temp:  [97.8 F (36.6 C)-98.4 F (36.9 C)] 97.9 F (36.6 C) (08/07 0540) Pulse Rate:  [54-61] 59 (08/07 0540) Resp:  [17-20] 20 (08/07 0540) BP: (105-130)/(68-89) 105/68 (08/07 0540) SpO2:  [93 %-100 %] 93 % (08/07 0540) Weight:  [219 lb 2.2 oz (99.4 kg)] 219 lb 2.2 oz (99.4 kg) (08/07 0540) Last BM Date: 09/25/17 General:    white male in NAD Heart:  Regular rate and rhythm; no murmurs Lungs: Respirations even and unlabored, lungs CTA bilaterally Abdomen:  Soft, marked TTP in the epigastrium and left side of abdomen and nondistended. Normal bowel sounds. Extremities:  Without edema. Neurologic:  Alert and oriented,  grossly normal neurologically. Psych:  Cooperative. Normal mood and affect.  Intake/Output from previous day: 08/06 0701 - 08/07 0700 In: 3499.2 [P.O.:1150; I.V.:2349.2] Out: 503 [Urine:503] Intake/Output this shift: Total I/O In: 635.9 [I.V.:635.9] Out: -   Lab Results: Recent Labs    10/01/17 0356  WBC 7.8  HGB 12.8*  HCT 37.7*  PLT 283   BMET Recent Labs    09/29/17 0511 10/01/17 0356  NA 137 141  K 4.0 3.4*  CL 102 103  CO2 24 28  GLUCOSE 94 94  BUN 9 8  CREATININE 0.74 0.87  CALCIUM 8.7* 8.8*   LFT Recent Labs    09/29/17 0511  PROT 7.0  ALBUMIN 3.6  AST 15  ALT 20  ALKPHOS 68  BILITOT 0.7   Studies/Results: Ct Abdomen Pelvis W Contrast  Result Date: 09/30/2017 CLINICAL DATA:   42 year old male with history of pancreatitis. Left upper quadrant abdominal pain. EXAM: CT ABDOMEN AND PELVIS WITH CONTRAST TECHNIQUE: Multidetector CT imaging of the abdomen and pelvis was performed using the standard protocol following bolus administration of intravenous contrast. CONTRAST:  25mL ISOVUE-300 IOPAMIDOL (ISOVUE-300) INJECTION 61%, 137mL OMNIPAQUE IOHEXOL 300 MG/ML SOLN COMPARISON:  CT the abdomen and pelvis 09/26/2017. FINDINGS: Lower chest: Increasing areas of subsegmental atelectasis in the left lower lobe. Hepatobiliary: No suspicious cystic or solid hepatic lesions. No intra or extrahepatic biliary ductal dilatation. Gallbladder is normal in appearance. Pancreas: Increasing inflammatory changes adjacent to the distal body and tail of the pancreas, indicative of acute pancreatitis. Head and proximal body of the pancreas are generally normal in appearance. Previously described soft tissue mass with internal calcifications in the tail of the pancreas is again noted, but difficult to discretely measure given the surrounding inflammatory changes. Spleen: Unremarkable. Adrenals/Urinary Tract: Bilateral kidneys and adrenal glands are normal in appearance. No hydroureteronephrosis. Urinary bladder is normal. Bilateral adrenal glands are normal in appearance. Stomach/Bowel: Normal appearance of the stomach. No pathologic dilatation of small bowel or colon. Normal appendix. Vascular/Lymphatic: Aortic atherosclerosis. No aneurysm identified in the visualized abdominal vasculature. No lymphadenopathy noted in the abdomen. Other: Inflammatory changes adjacent to the distal body and tail of the pancreas with a trace amount of fluid tracking in the left pericolic gutter. No larger  volume of ascites. No pneumoperitoneum. Reproductive: Prostate gland and seminal vesicles are unremarkable in appearance. Musculoskeletal: There are no aggressive appearing lytic or blastic lesions noted in the visualized portions of  the skeleton. IMPRESSION: 1. Worsening inflammatory changes adjacent to the distal body and tail of the pancreas, compatible with progressive pancreatitis. No pancreatic pseudocyst or other complicating features noted at this time. Previously demonstrated partially calcified soft tissue mass in the tail of the pancreas is partially obscured on today's examination, but attention on future follow-up studies is recommended. 2. Aortic atherosclerosis. Electronically Signed   By: Vinnie Langton M.D.   On: 09/30/2017 20:28    Assessment / Plan:   Assessment: 1.  Acute pancreatitis: After EUS with FNA on 09/25/2017, worsening per CT yesterday 09/30/2017 2.  Pancreatic mass: Recent EUS with FNA Dr. Ardis Hughs 09/25/2017, awaiting surgical resection with Dr. Barry Dienes, has follow-up with her next Friday  Plan: 1.  CT showed worsening pancreatitis yesterday after 5 days in the hospital, continue fluids and scheduled antiemetics as well as pain medicine 2.  Patient may continue full liquid/clear liquids as he tolerates today 3.  Please await further recommendations from Dr. Lyndel Safe later today  Thank you for kind consultation, we will continue to follow    LOS: 5 days   Levin Erp  10/01/2017, 10:18 AM   Attending physician's note   I have taken an interval history, reviewed the chart and examined the patient. I agree with the Advanced Practitioner's note, impression and recommendations.   42 year old with LUQ abdominal pain, 30lb weight loss, Dx with 3.5cm PNET in TOP on EUS with FNA 02/28/2393 complicated by acute pancreatitis.  CT yesterday showing worsening of pancreatitis.  No pseudocyst. Plan: Continue supportive treatment, pain control, nausea control, advance diet gradually as tolerated.   Carmell Austria, MD

## 2017-10-01 NOTE — Progress Notes (Signed)
PROGRESS NOTE    Andre Simmons   WJX:[id number]  DOB: 17-Jul-1975  DOA: 09/26/2017 PCP: Maryella Shivers, MD   Brief Narrative:  Andre Simmons is a 42 y/o male with was found to have a mass in the tail of his pancrease and underwent an EUS and biopsy on 09/25/17 by Dr Ardis Hughs and presented the following day with upper abdominal pain and was found to have acute pancreatitis. He was treated conservatively and slowly advanced to solid food but could not tolerate his first meal which was a chicken sand which.    Subjective: Mild nausea. Not vomiting up liquids. Pain is present "at the tail of the pancreas" and he points to his left upper/ mid abodmen.     Assessment & Plan:   Principal Problem:   Acute pancreatitis - post EUS - unable to advance to solids- repeat CT showed worsening pancreatitis - GI consulted for further input as his symptoms are not resolving-  - he is being advanced to full liquids today by GI - he is on IV Dilaudid every 3 hrs PRN and Percocets for his pain - he is using both quite frequently - he is on Zofran for nausea -cont IVF   Active Problems:   Pancreatic lesion - he has a 3.5 cm mass in the tail of the pancreas with about 30 lb wt loss - has been referred to general surgery  Hypokalemia - replaced orally and IV - recheck tomorrow    Chronic back pain - cont Percocets which he takes at home  DVT prophylaxis: SCDs Code Status: Full code Family Communication:  Disposition Plan: home when symptoms resolve Consultants:   GI Procedures:   none Antimicrobials:  Anti-infectives (From admission, onward)   None       Objective: Vitals:   09/30/17 0507 09/30/17 1547 09/30/17 2201 10/01/17 0540  BP: 107/61 130/83 128/89 105/68  Pulse: (!) 58 (!) 54 61 (!) 59  Resp: 20 17 20 20   Temp: 98 F (36.7 C) 97.8 F (36.6 C) 98.4 F (36.9 C) 97.9 F (36.6 C)  TempSrc: Oral Oral Oral Oral  SpO2: 96% 99% 100% 93%  Weight: 98.5 kg (217 lb 2.5 oz)    99.4 kg (219 lb 2.2 oz)  Height:        Intake/Output Summary (Last 24 hours) at 10/01/2017 1351 Last data filed at 10/01/2017 1200 Gross per 24 hour  Intake 4411.44 ml  Output 502 ml  Net 3909.44 ml   Filed Weights   09/29/17 0515 09/30/17 0507 10/01/17 0540  Weight: 102.1 kg (225 lb 1.4 oz) 98.5 kg (217 lb 2.5 oz) 99.4 kg (219 lb 2.2 oz)    Examination: General exam: Appears comfortable  HEENT: PERRLA, oral mucosa moist, no sclera icterus or thrush Respiratory system: Clear to auscultation. Respiratory effort normal. Cardiovascular system: S1 & S2 heard, RRR.   Gastrointestinal system: Abdomen soft,  Tender in epigastrium and LUQ, nondistended. Normal bowel sound. No organomegaly Central nervous system: Alert and oriented. No focal neurological deficits. Extremities: No cyanosis, clubbing or edema Skin: No rashes or ulcers Psychiatry:  Mood & affect appropriate.     Data Reviewed: I have personally reviewed following labs and imaging studies  CBC: Recent Labs  Lab 09/26/17 1421 09/27/17 0441 10/01/17 0356  WBC 9.1 9.2 7.8  NEUTROABS  --   --  5.9  HGB 14.9 13.2 12.8*  HCT 43.5 38.8* 37.7*  MCV 93.3 94.6 94.5  PLT 295 222 283  Basic Metabolic Panel: Recent Labs  Lab 09/26/17 1421 09/27/17 0441 09/29/17 0511 10/01/17 0356  NA 139 141 137 141  K 4.2 3.6 4.0 3.4*  CL 105 108 102 103  CO2 23 25 24 28   GLUCOSE 111* 94 94 94  BUN 12 9 9 8   CREATININE 0.82 0.74 0.74 0.87  CALCIUM 9.3 8.6* 8.7* 8.8*  MG  --   --   --  1.9   GFR: Estimated Creatinine Clearance: 133.5 mL/min (by C-G formula based on SCr of 0.87 mg/dL). Liver Function Tests: Recent Labs  Lab 09/26/17 1421 09/27/17 0441 09/29/17 0511  AST 22 15 15   ALT 41 28 20  ALKPHOS 79 66 68  BILITOT 0.4 0.8 0.7  PROT 8.0 6.4* 7.0  ALBUMIN 4.7 3.8 3.6   Recent Labs  Lab 09/26/17 1421 09/27/17 0441 09/29/17 0511  LIPASE 283* 208* 42   No results for input(s): AMMONIA in the last 168  hours. Coagulation Profile: No results for input(s): INR, PROTIME in the last 168 hours. Cardiac Enzymes: No results for input(s): CKTOTAL, CKMB, CKMBINDEX, TROPONINI in the last 168 hours. BNP (last 3 results) No results for input(s): PROBNP in the last 8760 hours. HbA1C: No results for input(s): HGBA1C in the last 72 hours. CBG: No results for input(s): GLUCAP in the last 168 hours. Lipid Profile: No results for input(s): CHOL, HDL, LDLCALC, TRIG, CHOLHDL, LDLDIRECT in the last 72 hours. Thyroid Function Tests: No results for input(s): TSH, T4TOTAL, FREET4, T3FREE, THYROIDAB in the last 72 hours. Anemia Panel: No results for input(s): VITAMINB12, FOLATE, FERRITIN, TIBC, IRON, RETICCTPCT in the last 72 hours. Urine analysis:    Component Value Date/Time   COLORURINE STRAW (A) 09/26/2017 1636   APPEARANCEUR CLEAR 09/26/2017 1636   LABSPEC 1.009 09/26/2017 1636   PHURINE 8.0 09/26/2017 1636   GLUCOSEU NEGATIVE 09/26/2017 1636   HGBUR NEGATIVE 09/26/2017 1636   BILIRUBINUR NEGATIVE 09/26/2017 1636   KETONESUR NEGATIVE 09/26/2017 1636   PROTEINUR NEGATIVE 09/26/2017 1636   UROBILINOGEN 0.2 09/19/2009 1400   NITRITE NEGATIVE 09/26/2017 1636   LEUKOCYTESUR TRACE (A) 09/26/2017 1636   Sepsis Labs: @LABRCNTIP (procalcitonin:4,lacticidven:4) )No results found for this or any previous visit (from the past 240 hour(s)).       Radiology Studies: Ct Abdomen Pelvis W Contrast  Result Date: 09/30/2017 CLINICAL DATA:  42 year old male with history of pancreatitis. Left upper quadrant abdominal pain. EXAM: CT ABDOMEN AND PELVIS WITH CONTRAST TECHNIQUE: Multidetector CT imaging of the abdomen and pelvis was performed using the standard protocol following bolus administration of intravenous contrast. CONTRAST:  33mL ISOVUE-300 IOPAMIDOL (ISOVUE-300) INJECTION 61%, 146mL OMNIPAQUE IOHEXOL 300 MG/ML SOLN COMPARISON:  CT the abdomen and pelvis 09/26/2017. FINDINGS: Lower chest: Increasing areas  of subsegmental atelectasis in the left lower lobe. Hepatobiliary: No suspicious cystic or solid hepatic lesions. No intra or extrahepatic biliary ductal dilatation. Gallbladder is normal in appearance. Pancreas: Increasing inflammatory changes adjacent to the distal body and tail of the pancreas, indicative of acute pancreatitis. Head and proximal body of the pancreas are generally normal in appearance. Previously described soft tissue mass with internal calcifications in the tail of the pancreas is again noted, but difficult to discretely measure given the surrounding inflammatory changes. Spleen: Unremarkable. Adrenals/Urinary Tract: Bilateral kidneys and adrenal glands are normal in appearance. No hydroureteronephrosis. Urinary bladder is normal. Bilateral adrenal glands are normal in appearance. Stomach/Bowel: Normal appearance of the stomach. No pathologic dilatation of small bowel or colon. Normal appendix. Vascular/Lymphatic: Aortic atherosclerosis. No aneurysm  identified in the visualized abdominal vasculature. No lymphadenopathy noted in the abdomen. Other: Inflammatory changes adjacent to the distal body and tail of the pancreas with a trace amount of fluid tracking in the left pericolic gutter. No larger volume of ascites. No pneumoperitoneum. Reproductive: Prostate gland and seminal vesicles are unremarkable in appearance. Musculoskeletal: There are no aggressive appearing lytic or blastic lesions noted in the visualized portions of the skeleton. IMPRESSION: 1. Worsening inflammatory changes adjacent to the distal body and tail of the pancreas, compatible with progressive pancreatitis. No pancreatic pseudocyst or other complicating features noted at this time. Previously demonstrated partially calcified soft tissue mass in the tail of the pancreas is partially obscured on today's examination, but attention on future follow-up studies is recommended. 2. Aortic atherosclerosis. Electronically Signed   By:  Vinnie Langton M.D.   On: 09/30/2017 20:28      Scheduled Meds: . sertraline  50 mg Oral Daily   Continuous Infusions: . 0.9 % NaCl with KCl 40 mEq / L 125 mL/hr at 10/01/17 1200     LOS: 5 days    Time spent in minutes: 35    Debbe Odea, MD [facility] Pager: [email or web address] Password [email or web address] 10/01/2017, 1:51 PM

## 2017-10-02 LAB — BASIC METABOLIC PANEL
Anion gap: 8 (ref 5–15)
BUN: 6 mg/dL (ref 6–20)
CALCIUM: 8.6 mg/dL — AB (ref 8.9–10.3)
CO2: 27 mmol/L (ref 22–32)
CREATININE: 0.73 mg/dL (ref 0.61–1.24)
Chloride: 104 mmol/L (ref 98–111)
Glucose, Bld: 102 mg/dL — ABNORMAL HIGH (ref 70–99)
POTASSIUM: 3.9 mmol/L (ref 3.5–5.1)
Sodium: 139 mmol/L (ref 135–145)

## 2017-10-02 NOTE — Progress Notes (Addendum)
Progress Note   Subjective  Chief Complaint: Acute pancreatitis after EUS with FNA on 09/25/2017  Today, reports that they increased his Percocet dosing and this is helping with his breakthrough pain.  Does report he was able to handle all of his full liquids yesterday but does not want to increase this as he feels as though he may have a problem.  Does explain he talked with Dr. Barry Dienes yesterday and she explained that they will need to wait 3 weeks until after acute pancreatitis to have surgery, but she is happy to see him in clinic next Friday.  Denies any other concerns today.   Objective   Vital signs in last 24 hours: Temp:  [98 F (36.7 C)-98.3 F (36.8 C)] 98 F (36.7 C) (08/08 0254) Pulse Rate:  [57-63] 57 (08/08 0254) Resp:  [16-18] 16 (08/08 0254) BP: (109-134)/(74-90) 134/90 (08/08 0254) SpO2:  [96 %-100 %] 96 % (08/08 0254) Weight:  [99.2 kg] 99.2 kg (08/08 0254) Last BM Date: 10/01/17 General:    white male in NAD Heart:  Regular rate and rhythm; no murmurs Lungs: Respirations even and unlabored, lungs CTA bilaterally Abdomen:  Soft, marked ttp in the epigastrum and left side of the abdomen and nondistended. Normal bowel sounds. Extremities:  Without edema. Neurologic:  Alert and oriented,  grossly normal neurologically. Psych:  Cooperative. Normal mood and affect.  Intake/Output from previous day: 08/07 0701 - 08/08 0700 In: 4373.6 [P.O.:1200; I.V.:3173.6] Out: -  Intake/Output this shift: Total I/O In: 276 [I.V.:276] Out: -   Lab Results: Recent Labs    10/01/17 0356  WBC 7.8  HGB 12.8*  HCT 37.7*  PLT 283   BMET Recent Labs    10/01/17 0356 10/02/17 0352  NA 141 139  K 3.4* 3.9  CL 103 104  CO2 28 27  GLUCOSE 94 102*  BUN 8 6  CREATININE 0.87 0.73  CALCIUM 8.8* 8.6*    Studies/Results: Ct Abdomen Pelvis W Contrast  Result Date: 09/30/2017 CLINICAL DATA:  42 year old male with history of pancreatitis. Left upper quadrant abdominal pain.  EXAM: CT ABDOMEN AND PELVIS WITH CONTRAST TECHNIQUE: Multidetector CT imaging of the abdomen and pelvis was performed using the standard protocol following bolus administration of intravenous contrast. CONTRAST:  74mL ISOVUE-300 IOPAMIDOL (ISOVUE-300) INJECTION 61%, 184mL OMNIPAQUE IOHEXOL 300 MG/ML SOLN COMPARISON:  CT the abdomen and pelvis 09/26/2017. FINDINGS: Lower chest: Increasing areas of subsegmental atelectasis in the left lower lobe. Hepatobiliary: No suspicious cystic or solid hepatic lesions. No intra or extrahepatic biliary ductal dilatation. Gallbladder is normal in appearance. Pancreas: Increasing inflammatory changes adjacent to the distal body and tail of the pancreas, indicative of acute pancreatitis. Head and proximal body of the pancreas are generally normal in appearance. Previously described soft tissue mass with internal calcifications in the tail of the pancreas is again noted, but difficult to discretely measure given the surrounding inflammatory changes. Spleen: Unremarkable. Adrenals/Urinary Tract: Bilateral kidneys and adrenal glands are normal in appearance. No hydroureteronephrosis. Urinary bladder is normal. Bilateral adrenal glands are normal in appearance. Stomach/Bowel: Normal appearance of the stomach. No pathologic dilatation of small bowel or colon. Normal appendix. Vascular/Lymphatic: Aortic atherosclerosis. No aneurysm identified in the visualized abdominal vasculature. No lymphadenopathy noted in the abdomen. Other: Inflammatory changes adjacent to the distal body and tail of the pancreas with a trace amount of fluid tracking in the left pericolic gutter. No larger volume of ascites. No pneumoperitoneum. Reproductive: Prostate gland and seminal vesicles are unremarkable in  appearance. Musculoskeletal: There are no aggressive appearing lytic or blastic lesions noted in the visualized portions of the skeleton. IMPRESSION: 1. Worsening inflammatory changes adjacent to the distal  body and tail of the pancreas, compatible with progressive pancreatitis. No pancreatic pseudocyst or other complicating features noted at this time. Previously demonstrated partially calcified soft tissue mass in the tail of the pancreas is partially obscured on today's examination, but attention on future follow-up studies is recommended. 2. Aortic atherosclerosis. Electronically Signed   By: Vinnie Langton M.D.   On: 09/30/2017 20:28     Assessment / Plan:   Assessment: 1.  Acute pancreatitis: After EUS with FNA on 09/25/2017, CT 09/30/2017 with worsening pancreatitis 2.  Pancreatic mass: Diagnosed with 3.5 cm PNET on top on EUS with FNA 09/25/2017, plan is for pancreatic resection in the future  Plan: 1.  Continue full liquid diet, pain medication and antiemetics as well as fluids 2.  Please await further recommendations from Dr. Lyndel Safe later today  Thank you for your kind consultation, we will continue to follow along.   LOS: 6 days   Levin Erp  10/02/2017, 10:04 AM   Attending physician's note   I have taken an interval history, reviewed the chart and examined the patient. I agree with the Advanced Practitioner's note, impression and recommendations.   42 year old withLUQabdominal pain, 30lbweight loss, Dx with 3.5cm PNET in TOP on EUS with FNA 8/1/2019complicated by acute pancreatitis.  CT showed worsening of pancreatitis.  No pseudocyst. Plan: Continue supportive treatment, pain control, nausea control, advance diet gradually as tolerated.  Carmell Austria, MD

## 2017-10-02 NOTE — Consult Note (Signed)
Chief Complaint  Patient presents with  . Abdominal Pain  . Back Pain    Referring MD: Oretha Caprice, MD  HISTORY: Pt is a 42 yo M with a pancreatic mass that I am asked to consult on by Dr. Ardis Hughs.  He was diagnosed with this when he went to the ED with abdominal pain several weeks ago.  He was seen to have a solid mass in the tail of the pancreas.  He underwent EUS and biopsy.  The FNA cytology was consistent with neuroendocrine tumor vs solid pseudopapillary tumor.  Around 24 hours post biopsy, he developed severe abdominal pain and came to the ED with pancreatitis.  He is currently admitted and getting IV fluids.  He does not know of any family members with neuroendocrine tumors, but has several relatives with cancer.    He has been having worsening pain over the last few days.    Past Medical History:  Diagnosis Date  . Chronic back pain   . GERD (gastroesophageal reflux disease)   . Knee pain     Past Surgical History:  Procedure Laterality Date  . BACK SURGERY     L4-L5, S1 micro discectomy  . EUS N/A 09/25/2017   Procedure: UPPER ENDOSCOPIC ULTRASOUND (EUS) LINEAR;  Surgeon: Milus Banister, MD;  Location: [facility];  Service: Endoscopy;  Laterality: N/A;  Radial and Linear  . FINE NEEDLE ASPIRATION  09/25/2017   Procedure: FINE NEEDLE ASPIRATION (FNA) RADIAL;  Surgeon: Milus Banister, MD;  Location: [facility];  Service: Endoscopy;;  . KNEE SURGERY Left    x 20 surgeries  . MOUTH SURGERY     as a child, unsure if wisdom teeth or tonsils    Current Facility-Administered Medications  Medication Dose Route Frequency Provider Last Rate Last Dose  . 0.9 % NaCl with KCl 40 mEq / L  infusion   Intravenous Continuous Debbe Odea, MD 125 mL/hr at 10/02/17 1359    . acetaminophen (TYLENOL) tablet 650 mg  650 mg Oral Q6H PRN Lovey Newcomer T, NP   650 mg at 09/28/17 0008  . HYDROmorphone (DILAUDID) injection 1 mg  1 mg Intravenous Q3H PRN Levin Erp, PA   1 mg at  10/02/17 1355  . ondansetron (ZOFRAN) tablet 4 mg  4 mg Oral Q6H PRN Phillips Grout, MD   4 mg at 10/02/17 0803   Or  . ondansetron [facility]) injection 4 mg  4 mg Intravenous Q6H PRN Phillips Grout, MD   4 mg at 10/01/17 1400  . oxyCODONE-acetaminophen (PERCOCET/ROXICET) 5-325 MG per tablet 1 tablet  1 tablet Oral Q4H PRN Gardiner Barefoot, NP   1 tablet at 10/02/17 0854  . pantoprazole (PROTONIX) EC tablet 80 mg  80 mg Oral QHS Gardiner Barefoot, NP   80 mg at 10/01/17 2357  . sertraline (ZOLOFT) tablet 50 mg  50 mg Oral Daily Derrill Kay A, MD   50 mg at 10/02/17 1050     No Known Allergies   Family History  Problem Relation Age of Onset  . Thyroid cancer Mother   . Other Father        MVA at age 61  . Esophageal cancer Maternal Grandmother        smoker  . Breast cancer Maternal Grandmother   . Esophageal cancer Paternal Grandmother   . Colon cancer Neg Hx   . Rectal cancer Neg Hx      Social History   Socioeconomic History  .  Marital status: Single    Spouse name: Not on file  . Number of children: 2  . Years of education: Not on file  . Highest education level: Not on file  Occupational History  . Occupation: Disabled  Social Needs  . Financial resource strain: Not on file  . Food insecurity:    Worry: Not on file    Inability: Not on file  . Transportation needs:    Medical: Not on file    Non-medical: Not on file  Tobacco Use  . Smoking status: Never Smoker  . Smokeless tobacco: Never Used  Substance and Sexual Activity  . Alcohol use: No  . Drug use: No  . Sexual activity: Not on file  Lifestyle  . Physical activity:    Days per week: Not on file    Minutes per session: Not on file  . Stress: Not on file  Relationships  . Social connections:    Talks on phone: Not on file    Gets together: Not on file    Attends religious service: Not on file    Active member of club or organization: Not on file    Attends meetings of clubs or  organizations: Not on file    Relationship status: Not on file  Other Topics Concern  . Not on file  Social History Narrative  . Not on file     REVIEW OF SYSTEMS - PERTINENT POSITIVES ONLY: 12 point review of systems negative other than HPI and PMH except for HPI  EXAM: Vitals:   10/01/17 2126 10/02/17 0254  BP: 124/88 134/90  Pulse: 60 (!) 57  Resp: 16 16  Temp: 98.2 F (36.8 C) 98 F (36.7 C)  SpO2: 100% 96%    Wt Readings from Last 3 Encounters:  10/02/17 99.2 kg  09/25/17 98 kg  09/18/17 100.2 kg     Gen:  Looks uncomfortable.    Well nourished.   Neurological: Alert and oriented to person, place, and time. Coordination normal.  Head: Normocephalic and atraumatic.  Eyes: Conjunctivae are normal. Pupils are equal, round, and reactive to light. No scleral icterus.  Neck: Normal range of motion. Neck supple. No tracheal deviation or thyromegaly present.  Cardiovascular: Normal rate,  intact distal pulses.   Respiratory: Effort normal.  No respiratory distress.   GI: Soft. The abdomen is soft and nontender.  There is no rebound and no guarding.  Skin: Skin is warm and dry. No rash noted. No diaphoresis. No erythema. No pallor. No clubbing, cyanosis, or edema.   Psychiatric: Normal mood and affect. Behavior is normal. Judgment and thought content normal.    LABORATORY RESULTS: Available labs are reviewed   Recent Results (from the past 2160 hour(s))  Comprehensive metabolic panel     Status: Abnormal   Collection Time: 09/06/17  4:01 AM  Result Value Ref Range   Sodium 137 135 - 145 mmol/L   Potassium 3.6 3.5 - 5.1 mmol/L   Chloride 105 98 - 111 mmol/L    Comment: Please note change in reference range.   CO2 24 22 - 32 mmol/L   Glucose, Bld 89 70 - 99 mg/dL    Comment: Please note change in reference range.   BUN 10 6 - 20 mg/dL    Comment: Please note change in reference range.   Creatinine, Ser 0.95 0.61 - 1.24 mg/dL   Calcium 8.8 (L) 8.9 - 10.3 mg/dL    Total Protein 7.6 6.5 - 8.1 g/dL  Albumin 4.4 3.5 - 5.0 g/dL   AST 23 15 - 41 U/L   ALT 45 (H) 0 - 44 U/L    Comment: Please note change in reference range.   Alkaline Phosphatase 77 38 - 126 U/L   Total Bilirubin 0.8 0.3 - 1.2 mg/dL   GFR calc non Af Amer >60 >60 mL/min   GFR calc Af Amer >60 >60 mL/min    Comment: (NOTE) The eGFR has been calculated using the CKD EPI equation. This calculation has not been validated in all clinical situations. eGFR's persistently <60 mL/min signify possible Chronic Kidney Disease.    Anion gap 8 5 - 15    Comment: Performed at [facility], [address] [place], [place] [address]  Lipase, blood     Status: None   Collection Time: 09/06/17  4:01 AM  Result Value Ref Range   Lipase 39 11 - 51 U/L    Comment: Performed at [facility], [address] [place], [place] [address]  CBC with Differential/Platelet     Status: None   Collection Time: 09/06/17  4:01 AM  Result Value Ref Range   WBC 7.9 4.0 - 10.5 K/uL   RBC 4.56 4.22 - 5.81 MIL/uL   Hemoglobin 14.9 13.0 - 17.0 g/dL   HCT 42.0 39.0 - 52.0 %   MCV 92.1 78.0 - 100.0 fL   MCH 32.7 26.0 - 34.0 pg   MCHC 35.5 30.0 - 36.0 g/dL   RDW 12.3 11.5 - 15.5 %   Platelets 259 150 - 400 K/uL   Neutrophils Relative % 59 %   Neutro Abs 4.7 1.7 - 7.7 K/uL   Lymphocytes Relative 30 %   Lymphs Abs 2.4 0.7 - 4.0 K/uL   Monocytes Relative 9 %   Monocytes Absolute 0.7 0.1 - 1.0 K/uL   Eosinophils Relative 2 %   Eosinophils Absolute 0.2 0.0 - 0.7 K/uL   Basophils Relative 0 %   Basophils Absolute 0.0 0.0 - 0.1 K/uL    Comment: Performed at [facility], [address] [place], [place] [address]  Urinalysis, Routine w reflex microscopic     Status: Abnormal   Collection Time: 09/06/17  5:19 AM  Result Value Ref Range   Color, Urine YELLOW YELLOW   APPearance CLEAR CLEAR   Specific Gravity, Urine 1.025 1.005 - 1.030   pH 6.0 5.0 - 8.0   Glucose, UA NEGATIVE  NEGATIVE mg/dL   Hgb urine dipstick NEGATIVE NEGATIVE   Bilirubin Urine SMALL (A) NEGATIVE   Ketones, ur NEGATIVE NEGATIVE mg/dL   Protein, ur NEGATIVE NEGATIVE mg/dL   Nitrite NEGATIVE NEGATIVE   Leukocytes, UA NEGATIVE NEGATIVE    Comment: Microscopic not done on urines with negative protein, blood, leukocytes, nitrite, or glucose < 500 mg/dL. Performed at [facility], [address] [place], [place] [address]   Lipase, blood     Status: Abnormal   Collection Time: 09/26/17  2:21 PM  Result Value Ref Range   Lipase 283 (H) 11 - 51 U/L    Comment: Performed at [facility], [place] [address] [place], [place] [address]  Comprehensive metabolic panel     Status: Abnormal   Collection Time: 09/26/17  2:21 PM  Result Value Ref Range   Sodium 139 135 - 145 mmol/L   Potassium 4.2 3.5 - 5.1 mmol/L   Chloride 105 98 - 111 mmol/L   CO2  23 22 - 32 mmol/L   Glucose, Bld 111 (H) 70 - 99 mg/dL   BUN 12 6 - 20 mg/dL   Creatinine, Ser 0.82 0.61 - 1.24 mg/dL   Calcium 9.3 8.9 - 10.3 mg/dL   Total Protein 8.0 6.5 - 8.1 g/dL   Albumin 4.7 3.5 - 5.0 g/dL   AST 22 15 - 41 U/L   ALT 41 0 - 44 U/L   Alkaline Phosphatase 79 38 - 126 U/L   Total Bilirubin 0.4 0.3 - 1.2 mg/dL   GFR calc non Af Amer >60 >60 mL/min   GFR calc Af Amer >60 >60 mL/min    Comment: (NOTE) The eGFR has been calculated using the CKD EPI equation. This calculation has not been validated in all clinical situations. eGFR's persistently <60 mL/min signify possible Chronic Kidney Disease.    Anion gap 11 5 - 15    Comment: Performed at [facility], [place] [address] [place], [place] [address]  CBC     Status: None   Collection Time: 09/26/17  2:21 PM  Result Value Ref Range   WBC 9.1 4.0 - 10.5 K/uL   RBC 4.66 4.22 - 5.81 MIL/uL   Hemoglobin 14.9 13.0 - 17.0 g/dL   HCT 43.5 39.0 - 52.0 %   MCV 93.3 78.0 - 100.0 fL   MCH 32.0 26.0 - 34.0 pg   MCHC 34.3 30.0 - 36.0 g/dL    RDW 12.5 11.5 - 15.5 %   Platelets 295 150 - 400 K/uL    Comment: Performed at [facility], [place] [address], [place], [place] [address]  I-stat troponin, ED     Status: None   Collection Time: 09/26/17  3:32 PM  Result Value Ref Range   Troponin i, poc 0.02 0.00 - 0.08 ng/mL   Comment 3            Comment: Due to the release kinetics of cTnI, a negative result within the first hours of the onset of symptoms does not rule out myocardial infarction with certainty. If myocardial infarction is still suspected, repeat the test at appropriate intervals.   Urinalysis, Routine w reflex microscopic     Status: Abnormal   Collection Time: 09/26/17  4:36 PM  Result Value Ref Range   Color, Urine STRAW (A) YELLOW   APPearance CLEAR CLEAR   Specific Gravity, Urine 1.009 1.005 - 1.030   pH 8.0 5.0 - 8.0   Glucose, UA NEGATIVE NEGATIVE mg/dL   Hgb urine dipstick NEGATIVE NEGATIVE   Bilirubin Urine NEGATIVE NEGATIVE   Ketones, ur NEGATIVE NEGATIVE mg/dL   Protein, ur NEGATIVE NEGATIVE mg/dL   Nitrite NEGATIVE NEGATIVE   Leukocytes, UA TRACE (A) NEGATIVE   WBC, UA 6-10 0 - 5 WBC/hpf   Bacteria, UA RARE (A) NONE SEEN    Comment: Performed at [place] [address] [place], [place] [address]  Comprehensive metabolic panel     Status: Abnormal   Collection Time: 09/27/17  4:41 AM  Result Value Ref Range   Sodium 141 135 - 145 mmol/L   Potassium 3.6 3.5 - 5.1 mmol/L   Chloride 108 98 - 111 mmol/L   CO2 25 22 - 32 mmol/L   Glucose, Bld 94 70 - 99 mg/dL   BUN 9 6 - 20 mg/dL   Creatinine, Ser 0.74 0.61 - 1.24 mg/dL   Calcium 8.6 (L) 8.9 - 10.3 mg/dL   Total Protein 6.4 (L) 6.5 -  8.1 g/dL   Albumin 3.8 3.5 - 5.0 g/dL   AST 15 15 - 41 U/L   ALT 28 0 - 44 U/L   Alkaline Phosphatase 66 38 - 126 U/L   Total Bilirubin 0.8 0.3 - 1.2 mg/dL   GFR calc non Af Amer >60 >60 mL/min   GFR calc Af Amer >60 >60 mL/min    Comment: (NOTE) The eGFR has been  calculated using the CKD EPI equation. This calculation has not been validated in all clinical situations. eGFR's persistently <60 mL/min signify possible Chronic Kidney Disease.    Anion gap 8 5 - 15    Comment: Performed at [facility], [place] [address] [place], [place] [address]  CBC     Status: Abnormal   Collection Time: 09/27/17  4:41 AM  Result Value Ref Range   WBC 9.2 4.0 - 10.5 K/uL   RBC 4.10 (L) 4.22 - 5.81 MIL/uL   Hemoglobin 13.2 13.0 - 17.0 g/dL   HCT 38.8 (L) 39.0 - 52.0 %   MCV 94.6 78.0 - 100.0 fL   MCH 32.2 26.0 - 34.0 pg   MCHC 34.0 30.0 - 36.0 g/dL   RDW 12.6 11.5 - 15.5 %   Platelets 222 150 - 400 K/uL    Comment: Performed at [facility], [place] [address], [place], [place] [address]  Lipase, blood     Status: Abnormal   Collection Time: 09/27/17  4:41 AM  Result Value Ref Range   Lipase 208 (H) 11 - 51 U/L    Comment: Performed at [facility], [place] [address], [place], [place] [address]  Lipase, blood     Status: None   Collection Time: 09/29/17  5:11 AM  Result Value Ref Range   Lipase 42 11 - 51 U/L    Comment: Performed at [facility], [place] [address], [place], [place] [address]  Comprehensive metabolic panel     Status: Abnormal   Collection Time: 09/29/17  5:11 AM  Result Value Ref Range   Sodium 137 135 - 145 mmol/L   Potassium 4.0 3.5 - 5.1 mmol/L    Comment: SLIGHT HEMOLYSIS   Chloride 102 98 - 111 mmol/L   CO2 24 22 - 32 mmol/L   Glucose, Bld 94 70 - 99 mg/dL   BUN 9 6 - 20 mg/dL   Creatinine, Ser 0.74 0.61 - 1.24 mg/dL   Calcium 8.7 (L) 8.9 - 10.3 mg/dL   Total Protein 7.0 6.5 - 8.1 g/dL   Albumin 3.6 3.5 - 5.0 g/dL   AST 15 15 - 41 U/L   ALT 20 0 - 44 U/L   Alkaline Phosphatase 68 38 - 126 U/L   Total Bilirubin 0.7 0.3 - 1.2 mg/dL   GFR calc non Af Amer >60 >60 mL/min   GFR calc Af Amer >60 >60 mL/min    Comment: (NOTE) The eGFR has been calculated using the  CKD EPI equation. This calculation has not been validated in all clinical situations. eGFR's persistently <60 mL/min signify possible Chronic Kidney Disease.    Anion gap 11 5 - 15    Comment: Performed at [facility], [place] [address], [place], [place] [address]  CBC with Differential/Platelet     Status: Abnormal   Collection Time: 10/01/17  3:56 AM  Result Value Ref Range   WBC 7.8 4.0 - 10.5 K/uL   RBC 3.99 (L) 4.22 - 5.81 MIL/uL  Hemoglobin 12.8 (L) 13.0 - 17.0 g/dL   HCT 37.7 (L) 39.0 - 52.0 %   MCV 94.5 78.0 - 100.0 fL   MCH 32.1 26.0 - 34.0 pg   MCHC 34.0 30.0 - 36.0 g/dL   RDW 12.5 11.5 - 15.5 %   Platelets 283 150 - 400 K/uL   Neutrophils Relative % 75 %   Neutro Abs 5.9 1.7 - 7.7 K/uL   Lymphocytes Relative 16 %   Lymphs Abs 1.2 0.7 - 4.0 K/uL   Monocytes Relative 7 %   Monocytes Absolute 0.6 0.1 - 1.0 K/uL   Eosinophils Relative 2 %   Eosinophils Absolute 0.2 0.0 - 0.7 K/uL   Basophils Relative 0 %   Basophils Absolute 0.0 0.0 - 0.1 K/uL    Comment: Performed at [facility], [place] [address] [place], [place] [address]  Basic metabolic panel     Status: Abnormal   Collection Time: 10/01/17  3:56 AM  Result Value Ref Range   Sodium 141 135 - 145 mmol/L   Potassium 3.4 (L) 3.5 - 5.1 mmol/L   Chloride 103 98 - 111 mmol/L   CO2 28 22 - 32 mmol/L   Glucose, Bld 94 70 - 99 mg/dL   BUN 8 6 - 20 mg/dL   Creatinine, Ser 0.87 0.61 - 1.24 mg/dL   Calcium 8.8 (L) 8.9 - 10.3 mg/dL   GFR calc non Af Amer >60 >60 mL/min   GFR calc Af Amer >60 >60 mL/min    Comment: (NOTE) The eGFR has been calculated using the CKD EPI equation. This calculation has not been validated in all clinical situations. eGFR's persistently <60 mL/min signify possible Chronic Kidney Disease.    Anion gap 10 5 - 15    Comment: Performed at [facility], [place] [address], [place], [place] [address]  Magnesium     Status: None   Collection  Time: 10/01/17  3:56 AM  Result Value Ref Range   Magnesium 1.9 1.7 - 2.4 mg/dL    Comment: Performed at [facility] [place] [address] [place], [place] [address]  Basic metabolic panel     Status: Abnormal   Collection Time: 10/02/17  3:52 AM  Result Value Ref Range   Sodium 139 135 - 145 mmol/L   Potassium 3.9 3.5 - 5.1 mmol/L   Chloride 104 98 - 111 mmol/L   CO2 27 22 - 32 mmol/L   Glucose, Bld 102 (H) 70 - 99 mg/dL   BUN 6 6 - 20 mg/dL   Creatinine, Ser 0.73 0.61 - 1.24 mg/dL   Calcium 8.6 (L) 8.9 - 10.3 mg/dL   GFR calc non Af Amer >60 >60 mL/min   GFR calc Af Amer >60 >60 mL/min    Comment: (NOTE) The eGFR has been calculated using the CKD EPI equation. This calculation has not been validated in all clinical situations. eGFR's persistently <60 mL/min signify possible Chronic Kidney Disease.    Anion gap 8 5 - 15    Comment: Performed at [facility], [place] [address] [place], [place] [address]     RADIOLOGY RESULTS: See E-Chart or I-Site for most recent results.  Images and reports are reviewed.  Ct Abdomen Pelvis W Contrast  Result Date: 09/30/2017 CLINICAL DATA:  42 year old male with history of pancreatitis. Left upper quadrant abdominal pain. EXAM: CT ABDOMEN AND PELVIS WITH CONTRAST TECHNIQUE: Multidetector CT imaging of the abdomen and pelvis was performed using the  standard protocol following bolus administration of intravenous contrast. CONTRAST:  10m ISOVUE-300 IOPAMIDOL (ISOVUE-300) INJECTION 61%, 1051mOMNIPAQUE IOHEXOL 300 MG/ML SOLN COMPARISON:  CT the abdomen and pelvis 09/26/2017. FINDINGS: Lower chest: Increasing areas of subsegmental atelectasis in the left lower lobe. Hepatobiliary: No suspicious cystic or solid hepatic lesions. No intra or extrahepatic biliary ductal dilatation. Gallbladder is normal in appearance. Pancreas: Increasing inflammatory changes adjacent to the distal body and tail of the pancreas, indicative of  acute pancreatitis. Head and proximal body of the pancreas are generally normal in appearance. Previously described soft tissue mass with internal calcifications in the tail of the pancreas is again noted, but difficult to discretely measure given the surrounding inflammatory changes. Spleen: Unremarkable. Adrenals/Urinary Tract: Bilateral kidneys and adrenal glands are normal in appearance. No hydroureteronephrosis. Urinary bladder is normal. Bilateral adrenal glands are normal in appearance. Stomach/Bowel: Normal appearance of the stomach. No pathologic dilatation of small bowel or colon. Normal appendix. Vascular/Lymphatic: Aortic atherosclerosis. No aneurysm identified in the visualized abdominal vasculature. No lymphadenopathy noted in the abdomen. Other: Inflammatory changes adjacent to the distal body and tail of the pancreas with a trace amount of fluid tracking in the left pericolic gutter. No larger volume of ascites. No pneumoperitoneum. Reproductive: Prostate gland and seminal vesicles are unremarkable in appearance. Musculoskeletal: There are no aggressive appearing lytic or blastic lesions noted in the visualized portions of the skeleton. IMPRESSION: 1. Worsening inflammatory changes adjacent to the distal body and tail of the pancreas, compatible with progressive pancreatitis. No pancreatic pseudocyst or other complicating features noted at this time. Previously demonstrated partially calcified soft tissue mass in the tail of the pancreas is partially obscured on today's examination, but attention on future follow-up studies is recommended. 2. Aortic atherosclerosis. Electronically Signed   By: DaVinnie Langton.D.   On: 09/30/2017 20:28   Ct Abdomen Pelvis W Contrast  Result Date: 09/26/2017 CLINICAL DATA:  LEFT UPPER QUADRANT abdominal pain radiating into the back, 1 day post endoscopic ultrasound biopsy of a mass involving the pancreatic tail. EXAM: CT ABDOMEN AND PELVIS WITH CONTRAST  TECHNIQUE: Multidetector CT imaging of the abdomen and pelvis was performed using the standard protocol following bolus administration of intravenous contrast. CONTRAST:  10049mSOVUE-300 IOPAMIDOL INJECTION 61% IV. COMPARISON:  MRI abdomen 09/11/2017. CT abdomen 09/05/2017, 01/29/2016. FINDINGS: Lower chest: Heart size normal.  Visualized lung bases clear. Hepatobiliary: Liver normal in size and appearance. Gallbladder normal in appearance without calcified gallstones. No biliary ductal dilation. Pancreas: Mild edema/inflammation surrounding the body and tail of the pancreas, new since the most recent prior MRI and CT. The calcified heterogeneous soft tissue mass involving the pancreatic tail is unchanged, measuring approximately 3.8 x 3.2 cm (series 2, image 36). Remainder of the pancreas remains normal in appearance. Spleen: Normal in size and appearance. Adrenals/Urinary Tract: Normal appearing adrenal glands. Kidneys normal in size and appearance without focal parenchymal abnormality. No evidence of urinary tract calculi or obstruction. Normal-appearing urinary bladder. Stomach/Bowel: Stomach normal in appearance for the degree of distention. Normal-appearing small bowel. Scattered sigmoid colon diverticula without evidence of acute diverticulitis. Remainder of the colon normal in appearance. Normal caliber appendix containing a small appendicolith, without evidence of periappendiceal inflammation. Vascular/Lymphatic: Aortoiliac atherosclerosis, advanced for patient age, without evidence of aneurysm. Normal-appearing portal venous and systemic venous systems. No pathologic lymphadenopathy. Reproductive: Prostate gland and seminal vesicles normal in size and appearance for age. Other: None. Musculoskeletal: Degenerative disc disease, spondylosis and facet degenerative changes at L5-S1. No acute findings. IMPRESSION:  1. Acute pancreatitis involving the body and tail of the pancreas. 2. Stable calcified mass  involving the pancreatic tail. 3. No evidence of metastatic disease. 4.  Aortic Atherosclerosis, advanced for patient age.  (ICD10-170.0) Electronically Signed   By: Evangeline Dakin M.D.   On: 09/26/2017 17:07      ASSESSMENT AND PLAN: Distal pancreatic mass, probable malignant neuroendocrine tumor Pancreatitis Disability secondary to back injury  Pt will need distal pancreatectomy/splenectomy.    Discussed surgery with patient including risks and benefits.   Will get vaccines.   Pt will need to follow up in clinic prior to surgery. Pt will need to be fully recovered from pancreatitis   Will set up outpatient CT prior to surgery to make sure patient has not developed pseudocyst.     Milus Height MD [facility]      Visit Diagnoses: 1. Other acute pancreatitis without infection or necrosis   2. Acute LUQ pain   3. Nausea and vomiting in adult patient   4. Chronic back pain, unspecified back location, unspecified back pain laterality     Primary Care Physician: Maryella Shivers, MD

## 2017-10-02 NOTE — Care Management Important Message (Signed)
Important Message  Patient Details  Name: Andre Simmons MRN: [id number] Date of Birth: May 10, 1975   Medicare Important Message Given:  Yes    Kerin Salen 10/02/2017, 11:03 AMImportant Message  Patient Details  Name: Andre Simmons MRN: [id number] Date of Birth: 1975/04/17   Medicare Important Message Given:  Yes    Kerin Salen 10/02/2017, 11:03 AM

## 2017-10-02 NOTE — Progress Notes (Signed)
Patient Demographics:    Andre Simmons, is a 42 y.o. male, DOB - April 11, 1975, WUX:[id number]  Admit date - 09/26/2017   Admitting Physician Phillips Grout, MD  Outpatient Primary MD for the patient is Maryella Shivers, MD  LOS - 6   Chief Complaint  Patient presents with  . Abdominal Pain  . Back Pain        Subjective:    [name] today has no fevers, no emesis,  No chest pain, still having challenges with advancing diet from liquids to solids, still requiring lots of pain medications and antinausea medicine  Assessment  & Plan :    Principal Problem:   Acute pancreatitis Active Problems:   Pancreatic lesion   Chronic back pain  Fine-needle biopsy of the pancreatic body from 09/25/17-  THE DIFFERENTIAL DIAGNOSIS INCLUDES A LOW GRADE NEUROENDOCRINE TUMOR INCLUDING A CARCINOID TUMOR AS WELL AS A SOLID PSEUDOPAPILLARY TUMOR.  Brief Narrative:  Andre Simmons is a 42 y/o male with was found to have a mass in the tail of his pancrease and underwent an EUS and biopsy on 09/25/17 by Dr Ardis Hughs and presented the following day with upper abdominal pain and was found to have acute pancreatitis. He was treated conservatively and slowly advanced to solid food but could not tolerate his first meal which was a chicken sand which  Plan:-  1)Acute Pancreatitis- post EUS, tolerating liquid diet, patient still having challenges with attempts to advance diet to solids, continue Zofran as needed for nausea and have Dilaudid as needed for pain along with Percocet , FNA biopsy of Pancreas from 09/25/17 with low-grade neuroendocrine tumor including carcinoid as well as solid pseudopapillary tumor  2) chronic back pain--usually takes Percocets at home  3)3.5 cm mass in the pancreatic tail region with weight loss of about 30 pounds--- surgical consult appreciated, plan is for partial pancreatectomy with splenectomy in  4 to 6 weeks depending on patient's clinical improvement with regards to his pancreatitis  Code Status : full    Disposition Plan  : Requiring lots of pain medications and antinausea medications, patient is still unable to tolerate solid food, unable to discharge at this time for above reasons  Consults  : GI/general surgery   DVT Prophylaxis  :    SCDs   Lab Results  Component Value Date   PLT 283 10/01/2017    Inpatient Medications  Scheduled Meds: . pantoprazole  80 mg Oral QHS  . sertraline  50 mg Oral Daily   Continuous Infusions: . 0.9 % NaCl with KCl 40 mEq / L 125 mL/hr at 10/02/17 1451   PRN Meds:.acetaminophen, HYDROmorphone (DILAUDID) injection, ondansetron **OR** ondansetron (ZOFRAN) IV, oxyCODONE-acetaminophen    Anti-infectives (From admission, onward)   None        Objective:   Vitals:   10/01/17 0540 10/01/17 1518 10/01/17 2126 10/02/17 0254  BP: 105/68 109/74 124/88 134/90  Pulse: (!) 59 63 60 (!) 57  Resp: 20 18 16 16   Temp: 97.9 F (36.6 C) 98.3 F (36.8 C) 98.2 F (36.8 C) 98 F (36.7 C)  TempSrc: Oral Oral Oral Oral  SpO2: 93% 96% 100% 96%  Weight: 99.4 kg   99.2 kg  Height:  Wt Readings from Last 3 Encounters:  10/02/17 99.2 kg  09/25/17 98 kg  09/18/17 100.2 kg     Intake/Output Summary (Last 24 hours) at 10/02/2017 1757 Last data filed at 10/02/2017 1451 Gross per 24 hour  Intake 2522.52 ml  Output -  Net 2522.52 ml     Physical Exam  Gen:- Awake Alert,  In no apparent distress  HEENT:- [place].AT, No sclera icterus Neck-Supple Neck,No JVD,.  Lungs-  CTAB , good air movement CV- S1, S2 normal , regular Abd-  +ve B.Sounds, Abd Soft, abdominal tenderness especially in the periumbilical and left upper quadrant areas without rebound or guarding,    Extremity/Skin:- No  edema,   good pulses Psych-affect is appropriate, oriented x3 Neuro-no new focal deficits, no tremors   Data Review:   Micro Results No results found  for this or any previous visit (from the past 240 hour(s)).  Radiology Reports Ct Abdomen Pelvis W Contrast  Result Date: 09/30/2017 CLINICAL DATA:  42 year old male with history of pancreatitis. Left upper quadrant abdominal pain. EXAM: CT ABDOMEN AND PELVIS WITH CONTRAST TECHNIQUE: Multidetector CT imaging of the abdomen and pelvis was performed using the standard protocol following bolus administration of intravenous contrast. CONTRAST:  17mL ISOVUE-300 IOPAMIDOL (ISOVUE-300) INJECTION 61%, 140mL OMNIPAQUE IOHEXOL 300 MG/ML SOLN COMPARISON:  CT the abdomen and pelvis 09/26/2017. FINDINGS: Lower chest: Increasing areas of subsegmental atelectasis in the left lower lobe. Hepatobiliary: No suspicious cystic or solid hepatic lesions. No intra or extrahepatic biliary ductal dilatation. Gallbladder is normal in appearance. Pancreas: Increasing inflammatory changes adjacent to the distal body and tail of the pancreas, indicative of acute pancreatitis. Head and proximal body of the pancreas are generally normal in appearance. Previously described soft tissue mass with internal calcifications in the tail of the pancreas is again noted, but difficult to discretely measure given the surrounding inflammatory changes. Spleen: Unremarkable. Adrenals/Urinary Tract: Bilateral kidneys and adrenal glands are normal in appearance. No hydroureteronephrosis. Urinary bladder is normal. Bilateral adrenal glands are normal in appearance. Stomach/Bowel: Normal appearance of the stomach. No pathologic dilatation of small bowel or colon. Normal appendix. Vascular/Lymphatic: Aortic atherosclerosis. No aneurysm identified in the visualized abdominal vasculature. No lymphadenopathy noted in the abdomen. Other: Inflammatory changes adjacent to the distal body and tail of the pancreas with a trace amount of fluid tracking in the left pericolic gutter. No larger volume of ascites. No pneumoperitoneum. Reproductive: Prostate gland and seminal  vesicles are unremarkable in appearance. Musculoskeletal: There are no aggressive appearing lytic or blastic lesions noted in the visualized portions of the skeleton. IMPRESSION: 1. Worsening inflammatory changes adjacent to the distal body and tail of the pancreas, compatible with progressive pancreatitis. No pancreatic pseudocyst or other complicating features noted at this time. Previously demonstrated partially calcified soft tissue mass in the tail of the pancreas is partially obscured on today's examination, but attention on future follow-up studies is recommended. 2. Aortic atherosclerosis. Electronically Signed   By: Vinnie Langton M.D.   On: 09/30/2017 20:28   Ct Abdomen Pelvis W Contrast  Result Date: 09/26/2017 CLINICAL DATA:  LEFT UPPER QUADRANT abdominal pain radiating into the back, 1 day post endoscopic ultrasound biopsy of a mass involving the pancreatic tail. EXAM: CT ABDOMEN AND PELVIS WITH CONTRAST TECHNIQUE: Multidetector CT imaging of the abdomen and pelvis was performed using the standard protocol following bolus administration of intravenous contrast. CONTRAST:  131mL ISOVUE-300 IOPAMIDOL INJECTION 61% IV. COMPARISON:  MRI abdomen 09/11/2017. CT abdomen 09/05/2017, 01/29/2016. FINDINGS: Lower chest: Heart  size normal.  Visualized lung bases clear. Hepatobiliary: Liver normal in size and appearance. Gallbladder normal in appearance without calcified gallstones. No biliary ductal dilation. Pancreas: Mild edema/inflammation surrounding the body and tail of the pancreas, new since the most recent prior MRI and CT. The calcified heterogeneous soft tissue mass involving the pancreatic tail is unchanged, measuring approximately 3.8 x 3.2 cm (series 2, image 36). Remainder of the pancreas remains normal in appearance. Spleen: Normal in size and appearance. Adrenals/Urinary Tract: Normal appearing adrenal glands. Kidneys normal in size and appearance without focal parenchymal abnormality. No  evidence of urinary tract calculi or obstruction. Normal-appearing urinary bladder. Stomach/Bowel: Stomach normal in appearance for the degree of distention. Normal-appearing small bowel. Scattered sigmoid colon diverticula without evidence of acute diverticulitis. Remainder of the colon normal in appearance. Normal caliber appendix containing a small appendicolith, without evidence of periappendiceal inflammation. Vascular/Lymphatic: Aortoiliac atherosclerosis, advanced for patient age, without evidence of aneurysm. Normal-appearing portal venous and systemic venous systems. No pathologic lymphadenopathy. Reproductive: Prostate gland and seminal vesicles normal in size and appearance for age. Other: None. Musculoskeletal: Degenerative disc disease, spondylosis and facet degenerative changes at L5-S1. No acute findings. IMPRESSION: 1. Acute pancreatitis involving the body and tail of the pancreas. 2. Stable calcified mass involving the pancreatic tail. 3. No evidence of metastatic disease. 4.  Aortic Atherosclerosis, advanced for patient age.  (ICD10-170.0) Electronically Signed   By: Evangeline Dakin M.D.   On: 09/26/2017 17:07     CBC Recent Labs  Lab 09/26/17 1421 09/27/17 0441 10/01/17 0356  WBC 9.1 9.2 7.8  HGB 14.9 13.2 12.8*  HCT 43.5 38.8* 37.7*  PLT 295 222 283  MCV 93.3 94.6 94.5  MCH 32.0 32.2 32.1  MCHC 34.3 34.0 34.0  RDW 12.5 12.6 12.5  LYMPHSABS  --   --  1.2  MONOABS  --   --  0.6  EOSABS  --   --  0.2  BASOSABS  --   --  0.0    Chemistries  Recent Labs  Lab 09/26/17 1421 09/27/17 0441 09/29/17 0511 10/01/17 0356 10/02/17 0352  NA 139 141 137 141 139  K 4.2 3.6 4.0 3.4* 3.9  CL 105 108 102 103 104  CO2 23 25 24 28 27   GLUCOSE 111* 94 94 94 102*  BUN 12 9 9 8 6   CREATININE 0.82 0.74 0.74 0.87 0.73  CALCIUM 9.3 8.6* 8.7* 8.8* 8.6*  MG  --   --   --  1.9  --   AST 22 15 15   --   --   ALT 41 28 20  --   --   ALKPHOS 79 66 68  --   --   BILITOT 0.4 0.8 0.7  --    --    ------------------------------------------------------------------------------------------------------------------ No results for input(s): CHOL, HDL, LDLCALC, TRIG, CHOLHDL, LDLDIRECT in the last 72 hours.  No results found for: HGBA1C ------------------------------------------------------------------------------------------------------------------ No results for input(s): TSH, T4TOTAL, T3FREE, THYROIDAB in the last 72 hours.  Invalid input(s): FREET3 ------------------------------------------------------------------------------------------------------------------ No results for input(s): VITAMINB12, FOLATE, FERRITIN, TIBC, IRON, RETICCTPCT in the last 72 hours.  Coagulation profile No results for input(s): INR, PROTIME in the last 168 hours.  No results for input(s): DDIMER in the last 72 hours.  Cardiac Enzymes No results for input(s): CKMB, TROPONINI, MYOGLOBIN in the last 168 hours.  Invalid input(s): CK ------------------------------------------------------------------------------------------------------------------ No results found for: BNP   Roxan Hockey M.D on 10/02/2017 at 5:57 PM   Go to [email or web address] -  password [email or web address] for contact info  [facility] - Office  [phone]

## 2017-10-03 LAB — LIPASE, BLOOD: Lipase: 37 U/L (ref 11–51)

## 2017-10-03 NOTE — Progress Notes (Addendum)
    Progress Note   Subjective  Chief Complaint: Acute pancreatitis after EUS with FNA on 09/25/2017  Today, explains that he does continue with some nausea off and on, pain continues in the left upper quadrant though it may be slightly better.  He is willing to try a low-fat diet today.   Objective   Vital signs in last 24 hours: Temp:  [97.7 F (36.5 C)-98.3 F (36.8 C)] 97.7 F (36.5 C) (08/09 0459) Pulse Rate:  [52-55] 55 (08/09 0459) Resp:  [14-16] 16 (08/09 0459) BP: (114-117)/(82-89) 117/89 (08/09 0459) SpO2:  [99 %-100 %] 100 % (08/09 0459) Weight:  [99.7 kg] 99.7 kg (08/09 0459) Last BM Date: 10/01/17 General:    white male in NAD Heart:  Regular rate and rhythm; no murmurs Lungs: Respirations even and unlabored, lungs CTA bilaterally Abdomen:  Soft, mild LUQ ttp and nondistended. Normal bowel sounds. Extremities:  Without edema. Neurologic:  Alert and oriented,  grossly normal neurologically. Psych:  Cooperative. Normal mood and affect.  Intake/Output from previous day: 08/08 0701 - 08/09 0700 In: 3174.6 [P.O.:600; I.V.:2574.6] Out: 5 [Urine:5] Intake/Output this shift: Total I/O In: 651.3 [I.V.:651.3] Out: -   Lab Results: Recent Labs    10/01/17 0356  WBC 7.8  HGB 12.8*  HCT 37.7*  PLT 283   BMET Recent Labs    10/01/17 0356 10/02/17 0352  NA 141 139  K 3.4* 3.9  CL 103 104  CO2 28 27  GLUCOSE 94 102*  BUN 8 6  CREATININE 0.87 0.73  CALCIUM 8.8* 8.6*     Assessment / Plan:   Assessment: 1.  Acute pancreatitis: After EUS with FNA on 09/25/2017, CT 09/30/2017 with worsening pancreatitis, patient still experiencing some pain in his left upper quadrant, though this is somewhat chronic and thought possibly related to mass in pancreas 2.  Pancreatic mass: Diagnosed with 3.5 cm PET on top on EUS with FNA 09/25/2017, plans for pancreatic resection in the future with Dr. Barry Dienes  Plan: 1.  Advance patient to low-fat diet today and then advance as  tolerated 2.  Continue antiemetics, pain medication and fluids 3.  Please await any final recommendations from Dr. Lyndel Safe later today.  We will sign off.  Please call if you have any further questions or concerns.   LOS: 7 days   Levin Erp  10/03/2017, 11:04 AM   Attending physician's note   I have reviewed the chart and didn't examine the patient today as he was sleeping comfortably. I agree with the Advanced Practitioner's note, impression and recommendations.  42 year old withLUQabdominal pain, 30lbweight loss, Dx with 3.5cm PNET in TOP on EUS with FNA 8/1/2019complicated byacutepancreatitis. Has been seen informally by Dr Barry Dienes this hospitalization. Has out patient appt with her. Pain out of proportion to physical findings. Plan: Advance diet to low-fat diet. Change IV to PO pain meds. Continue anti-emetics. No new recommendations. Will sign off.  Carmell Austria, MD

## 2017-10-03 NOTE — Progress Notes (Addendum)
PROGRESS NOTE    Andre Simmons   MGN:[id number]  DOB: 12-02-1975  DOA: 09/26/2017 PCP: Maryella Shivers, MD   Brief Narrative:  Andre Simmons is a 42 y/o male with was found to have a mass in the tail of his pancrease and underwent an EUS and biopsy on 09/25/17 by Dr Ardis Hughs and presented the following day with upper abdominal pain and was found to have acute pancreatitis. He was treated conservatively and slowly advanced to solid food but could not tolerate his first meal which was a chicken sand which.    Subjective: Continued nausea and LUQ pain. Tolerating full liquids.     Assessment & Plan:   Principal Problem:   Acute pancreatitis - post EUS - unable to advance to solids- repeat CT showed worsening pancreatitis - GI consulted for further input as his symptoms are not resolving-  - he is being advanced to full liquids today by GI - he is on IV Dilaudid every 3 hrs PRN and Percocets for his pain - he is using both quite frequently - he is on Zofran for nausea -cont IVF  - I agree with advancing to solid food today- I have explained to the patient to start slowly  Active Problems:   Pancreatic lesion - he has a 3.5 cm mass in the tail of the pancreas with about 30 lb wt loss - has been referred to general surgery and will need to wait at least 3 wks due to acute panceatitis prior ot surgical resection, per Dr Barry Dienes  Hypokalemia - replaced orally and IV - recheck tomorrow    Chronic back pain - cont Percocets which he takes at home  DVT prophylaxis: SCDs Code Status: Full code Family Communication:  Disposition Plan: home when symptoms resolve Consultants:   GI Procedures:   none Antimicrobials:  Anti-infectives (From admission, onward)   None       Objective: Vitals:   10/02/17 0254 10/02/17 2040 10/03/17 0459 10/03/17 1418  BP: 134/90 114/82 117/89 118/60  Pulse: (!) 57 (!) 52 (!) 55 (!) 56  Resp: 16 14 16 15   Temp: 98 F (36.7 C) 98.3 F (36.8  C) 97.7 F (36.5 C) 98.1 F (36.7 C)  TempSrc: Oral Oral Oral Oral  SpO2: 96% 99% 100% 100%  Weight: 99.2 kg  99.7 kg   Height:        Intake/Output Summary (Last 24 hours) at 10/03/2017 1559 Last data filed at 10/03/2017 1329 Gross per 24 hour  Intake 2990.49 ml  Output -  Net 2990.49 ml   Filed Weights   10/01/17 0540 10/02/17 0254 10/03/17 0459  Weight: 99.4 kg 99.2 kg 99.7 kg    Examination: General exam: Appears comfortable  HEENT: PERRLA, oral mucosa moist, no sclera icterus or thrush Respiratory system: Clear to auscultation. Respiratory effort normal. Cardiovascular system: S1 & S2 heard, RRR.   Gastrointestinal system: Abdomen soft,  Tender in epigastrium and LUQ, nondistended. Normal bowel sound. No organomegaly Central nervous system: Alert and oriented. No focal neurological deficits. Extremities: No cyanosis, clubbing or edema Skin: No rashes or ulcers Psychiatry:  Mood & affect appropriate.     Data Reviewed: I have personally reviewed following labs and imaging studies  CBC: Recent Labs  Lab 09/27/17 0441 10/01/17 0356  WBC 9.2 7.8  NEUTROABS  --  5.9  HGB 13.2 12.8*  HCT 38.8* 37.7*  MCV 94.6 94.5  PLT 222 916   Basic Metabolic Panel: Recent Labs  Lab 09/27/17  9937 09/29/17 0511 10/01/17 0356 10/02/17 0352  NA 141 137 141 139  K 3.6 4.0 3.4* 3.9  CL 108 102 103 104  CO2 25 24 28 27   GLUCOSE 94 94 94 102*  BUN 9 9 8 6   CREATININE 0.74 0.74 0.87 0.73  CALCIUM 8.6* 8.7* 8.8* 8.6*  MG  --   --  1.9  --    GFR: Estimated Creatinine Clearance: 145.2 mL/min (by C-G formula based on SCr of 0.73 mg/dL). Liver Function Tests: Recent Labs  Lab 09/27/17 0441 09/29/17 0511  AST 15 15  ALT 28 20  ALKPHOS 66 68  BILITOT 0.8 0.7  PROT 6.4* 7.0  ALBUMIN 3.8 3.6   Recent Labs  Lab 09/27/17 0441 09/29/17 0511 10/03/17 1214  LIPASE 208* 42 37   No results for input(s): AMMONIA in the last 168 hours. Coagulation Profile: No results for  input(s): INR, PROTIME in the last 168 hours. Cardiac Enzymes: No results for input(s): CKTOTAL, CKMB, CKMBINDEX, TROPONINI in the last 168 hours. BNP (last 3 results) No results for input(s): PROBNP in the last 8760 hours. HbA1C: No results for input(s): HGBA1C in the last 72 hours. CBG: No results for input(s): GLUCAP in the last 168 hours. Lipid Profile: No results for input(s): CHOL, HDL, LDLCALC, TRIG, CHOLHDL, LDLDIRECT in the last 72 hours. Thyroid Function Tests: No results for input(s): TSH, T4TOTAL, FREET4, T3FREE, THYROIDAB in the last 72 hours. Anemia Panel: No results for input(s): VITAMINB12, FOLATE, FERRITIN, TIBC, IRON, RETICCTPCT in the last 72 hours. Urine analysis:    Component Value Date/Time   COLORURINE STRAW (A) 09/26/2017 1636   APPEARANCEUR CLEAR 09/26/2017 1636   LABSPEC 1.009 09/26/2017 1636   PHURINE 8.0 09/26/2017 1636   GLUCOSEU NEGATIVE 09/26/2017 1636   HGBUR NEGATIVE 09/26/2017 1636   BILIRUBINUR NEGATIVE 09/26/2017 1636   KETONESUR NEGATIVE 09/26/2017 1636   PROTEINUR NEGATIVE 09/26/2017 1636   UROBILINOGEN 0.2 09/19/2009 1400   NITRITE NEGATIVE 09/26/2017 1636   LEUKOCYTESUR TRACE (A) 09/26/2017 1636   Sepsis Labs: @LABRCNTIP (procalcitonin:4,lacticidven:4) )No results found for this or any previous visit (from the past 240 hour(s)).       Radiology Studies: No results found.    Scheduled Meds: . pantoprazole  80 mg Oral QHS  . sertraline  50 mg Oral Daily   Continuous Infusions: . 0.9 % NaCl with KCl 40 mEq / L 125 mL/hr (10/03/17 1329)     LOS: 7 days    Time spent in minutes: 35    Debbe Odea, MD [facility] Pager: [email or web address] Password [email or web address] 10/03/2017, 3:59 PM

## 2017-10-04 MED ORDER — HYDROMORPHONE HCL 1 MG/ML IJ SOLN
1.0000 mg | Freq: Four times a day (QID) | INTRAMUSCULAR | Status: DC | PRN
  Administered 2017-10-04: 1 mg via INTRAVENOUS
  Filled 2017-10-04: qty 1

## 2017-10-04 MED ORDER — HYDROMORPHONE HCL 1 MG/ML IJ SOLN
1.0000 mg | INTRAMUSCULAR | Status: AC | PRN
  Administered 2017-10-05: 1 mg via INTRAVENOUS
  Filled 2017-10-04: qty 1

## 2017-10-04 MED ORDER — HYDROMORPHONE HCL 1 MG/ML IJ SOLN
1.0000 mg | INTRAMUSCULAR | Status: DC | PRN
  Administered 2017-10-04: 1 mg via INTRAVENOUS
  Filled 2017-10-04: qty 1

## 2017-10-04 MED ORDER — OXYCODONE-ACETAMINOPHEN 5-325 MG PO TABS
1.0000 | ORAL_TABLET | ORAL | Status: DC | PRN
  Administered 2017-10-04 – 2017-10-05 (×4): 2 via ORAL
  Filled 2017-10-04 (×5): qty 2

## 2017-10-04 NOTE — Progress Notes (Signed)
PROGRESS NOTE    Andre Simmons   [id number]  DOB: [birth year]  DOA: 09/26/2017 PCP: Maryella Shivers, MD   Brief Narrative:  Andre Simmons is a 42 y/o male with was found to have a mass in the tail of his pancrease and underwent an EUS and biopsy on 09/25/17 by Dr Ardis Hughs and presented the following day with upper abdominal pain and was found to have acute pancreatitis. He was treated conservatively and slowly advanced to solid food but could not tolerate his first meal which was a chicken sand which.    Subjective: Continued nausea and LUQ pain. Tolerating only very small amount of solid food.      Assessment & Plan:   Principal Problem:   Acute pancreatitis - post EUS - unable to advance to solids- repeat CT showed worsening pancreatitis - GI consulted for further input as his symptoms are not resolving-   - he is on IV Dilaudid every 3 hrs PRN and Percocets for his pain - he is using both quite frequently - he is on Zofran for nausea - cont solid food- As Dilaudid can be causing the nausea, will wean Dilaudid and increase Percocet - he is quite certain that he does not get nauseated from Percocet  Active Problems:   Pancreatic lesion - he has a 3.5 cm mass in the tail of the pancreas with about 30 lb wt loss - has been referred to general surgery and will need to wait at least 3 wks due to acute panceatitis prior ot surgical resection, per Dr Barry Dienes  Hypokalemia - replaced orally and IV - recheck tomorrow    Chronic back pain - cont Percocets which he takes at home  DVT prophylaxis: SCDs Code Status: Full code Family Communication:  Disposition Plan: home when symptoms resolve Consultants:   GI Procedures:   none Antimicrobials:  Anti-infectives (From admission, onward)   None       Objective: Vitals:   10/03/17 1418 10/03/17 2034 10/04/17 0427 10/04/17 1335  BP: 118/60 (!) 133/94 (!) 142/81 116/82  Pulse: (!) 56 (!) 55 (!) 53 (!) 54  Resp: 15 18  20 16   Temp: 98.1 F (36.7 C) 98.2 F (36.8 C) 98.2 F (36.8 C) 98.4 F (36.9 C)  TempSrc: Oral Oral Oral Oral  SpO2: 100% 96% 97% 97%  Weight:   100 kg   Height:        Intake/Output Summary (Last 24 hours) at 10/04/2017 1501 Last data filed at 10/04/2017 0427 Gross per 24 hour  Intake 240 ml  Output -  Net 240 ml   Filed Weights   10/02/17 0254 10/03/17 0459 10/04/17 0427  Weight: 99.2 kg 99.7 kg 100 kg    Examination: General exam: Appears comfortable  HEENT: PERRLA, oral mucosa moist, no sclera icterus or thrush Respiratory system: Clear to auscultation. Respiratory effort normal. Cardiovascular system: S1 & S2 heard, RRR.   Gastrointestinal system: Abdomen soft,  Tender in epigastrium and LUQ, nondistended. Normal bowel sound. No organomegaly Central nervous system: Alert and oriented. No focal neurological deficits. Extremities: No cyanosis, clubbing or edema Skin: No rashes or ulcers Psychiatry:  Mood & affect appropriate.     Data Reviewed: I have personally reviewed following labs and imaging studies  CBC: Recent Labs  Lab 10/01/17 0356  WBC 7.8  NEUTROABS 5.9  HGB 12.8*  HCT 37.7*  MCV 94.5  PLT 631   Basic Metabolic Panel: Recent Labs  Lab 09/29/17 0511 10/01/17 0356 10/02/17  0352  NA 137 141 139  K 4.0 3.4* 3.9  CL 102 103 104  CO2 24 28 27   GLUCOSE 94 94 102*  BUN 9 8 6   CREATININE 0.74 0.87 0.73  CALCIUM 8.7* 8.8* 8.6*  MG  --  1.9  --    GFR: Estimated Creatinine Clearance: 145.2 mL/min (by C-G formula based on SCr of 0.73 mg/dL). Liver Function Tests: Recent Labs  Lab 09/29/17 0511  AST 15  ALT 20  ALKPHOS 68  BILITOT 0.7  PROT 7.0  ALBUMIN 3.6   Recent Labs  Lab 09/29/17 0511 10/03/17 1214  LIPASE 42 37   No results for input(s): AMMONIA in the last 168 hours. Coagulation Profile: No results for input(s): INR, PROTIME in the last 168 hours. Cardiac Enzymes: No results for input(s): CKTOTAL, CKMB, CKMBINDEX,  TROPONINI in the last 168 hours. BNP (last 3 results) No results for input(s): PROBNP in the last 8760 hours. HbA1C: No results for input(s): HGBA1C in the last 72 hours. CBG: No results for input(s): GLUCAP in the last 168 hours. Lipid Profile: No results for input(s): CHOL, HDL, LDLCALC, TRIG, CHOLHDL, LDLDIRECT in the last 72 hours. Thyroid Function Tests: No results for input(s): TSH, T4TOTAL, FREET4, T3FREE, THYROIDAB in the last 72 hours. Anemia Panel: No results for input(s): VITAMINB12, FOLATE, FERRITIN, TIBC, IRON, RETICCTPCT in the last 72 hours. Urine analysis:    Component Value Date/Time   COLORURINE STRAW (A) 09/26/2017 1636   APPEARANCEUR CLEAR 09/26/2017 1636   LABSPEC 1.009 09/26/2017 1636   PHURINE 8.0 09/26/2017 1636   GLUCOSEU NEGATIVE 09/26/2017 1636   HGBUR NEGATIVE 09/26/2017 1636   BILIRUBINUR NEGATIVE 09/26/2017 1636   KETONESUR NEGATIVE 09/26/2017 1636   PROTEINUR NEGATIVE 09/26/2017 1636   UROBILINOGEN 0.2 09/19/2009 1400   NITRITE NEGATIVE 09/26/2017 1636   LEUKOCYTESUR TRACE (A) 09/26/2017 1636   Sepsis Labs: @LABRCNTIP (procalcitonin:4,lacticidven:4) )No results found for this or any previous visit (from the past 240 hour(s)).       Radiology Studies: No results found.    Scheduled Meds: . pantoprazole  80 mg Oral QHS  . sertraline  50 mg Oral Daily   Continuous Infusions: . 0.9 % NaCl with KCl 40 mEq / L 125 mL/hr (10/04/17 1351)     LOS: 8 days    Time spent in minutes: 35    Debbe Odea, MD [facility] Pager: [email or web address] Password [email or web address] 10/04/2017, 3:01 PM

## 2017-10-05 LAB — BASIC METABOLIC PANEL
Anion gap: 8 (ref 5–15)
BUN: 10 mg/dL (ref 6–20)
CALCIUM: 9.2 mg/dL (ref 8.9–10.3)
CO2: 28 mmol/L (ref 22–32)
CREATININE: 0.98 mg/dL (ref 0.61–1.24)
Chloride: 104 mmol/L (ref 98–111)
GFR calc non Af Amer: >60 mL/min (ref >60)
Glucose, Bld: 109 mg/dL — ABNORMAL HIGH (ref 70–99)
Potassium: 4.5 mmol/L (ref 3.5–5.1)
SODIUM: 140 mmol/L (ref 135–145)

## 2017-10-05 MED ORDER — HYDROMORPHONE HCL 1 MG/ML IJ SOLN
1.0000 mg | INTRAMUSCULAR | Status: DC | PRN
  Administered 2017-10-05 – 2017-10-07 (×13): 1 mg via INTRAVENOUS
  Filled 2017-10-05 (×14): qty 1

## 2017-10-05 MED ORDER — HYDROMORPHONE HCL 2 MG PO TABS
1.0000 mg | ORAL_TABLET | Freq: Four times a day (QID) | ORAL | Status: DC | PRN
  Administered 2017-10-05 – 2017-10-06 (×4): 1 mg via ORAL
  Filled 2017-10-05 (×4): qty 1

## 2017-10-05 MED ORDER — SODIUM CHLORIDE 0.9 % IV SOLN
INTRAVENOUS | Status: DC
  Administered 2017-10-05 – 2017-10-08 (×5): via INTRAVENOUS

## 2017-10-05 MED ORDER — BOOST / RESOURCE BREEZE PO LIQD CUSTOM
1.0000 | Freq: Three times a day (TID) | ORAL | Status: DC
  Administered 2017-10-05 – 2017-10-09 (×5): 1 via ORAL

## 2017-10-05 MED ORDER — HYDROMORPHONE HCL 1 MG/ML IJ SOLN
1.0000 mg | Freq: Four times a day (QID) | INTRAMUSCULAR | Status: DC | PRN
  Administered 2017-10-05: 1 mg via INTRAVENOUS
  Filled 2017-10-05: qty 1

## 2017-10-05 NOTE — Progress Notes (Signed)
PROGRESS NOTE    Andre Simmons   [id number]:[id number]  DOB: Jun 21, 1975  DOA: 09/26/2017 PCP: Maryella Shivers, MD   Brief Narrative:  Andre Simmons is a 42 y/o male with was found to have a mass in the tail of his pancreas and underwent an EUS and biopsy on 09/25/17 by Dr Ardis Hughs and presented the following day with upper abdominal pain and was found to have acute pancreatitis. He was treated conservatively and slowly advanced to solid food but could not tolerate his first meal which was a chicken sand which.    Subjective: Continued nausea and LUQ pain. He states he is tolerating only very small amount of solid food but able to drink liquids.     Assessment & Plan:   Principal Problem:   Acute pancreatitis - post EUS - unable to advance to solids- repeat CT showed worsening pancreatitis - GI consulted for further input as his symptoms are not resolving-   - he is on IV Dilaudid every 3 hrs PRN and Percocets for his pain - he is using both quite frequently - he is on Zofran for nausea - 8/10 - cont solid food- As Dilaudid can be causing the nausea, will wean Dilaudid and increase Percocet - he is quite certain that he does not get nauseated from Percocet - 8/11- still complains of pain and ongoing nausea - insists that his pancreatitis is not improving- I have discussed this with Dr Lyndel Safe who agrees with me that this is likely no longer acute pancreatitis- do to ongoing pain, and nausea will make him NPO -  Active Problems:   Pancreatic lesion - he has a 3.5 cm mass in the tail of the pancreas with about 30 lb wt loss - has been referred to general surgery and will need to wait at least 3 wks due to acute panceatitis prior ot surgical resection, per Dr Barry Dienes  Hypokalemia - replaced orally and IV - recheck tomorrow    Chronic back pain - cont Percocets which he takes at home  DVT prophylaxis: SCDs Code Status: Full code Family Communication:  Disposition Plan: home when  symptoms resolve Consultants:   GI Procedures:   none Antimicrobials:  Anti-infectives (From admission, onward)   None       Objective: Vitals:   10/04/17 1335 10/04/17 2035 10/05/17 0406 10/05/17 1301  BP: 116/82 117/72 122/86 126/85  Pulse: (!) 54 (!) 53 61 66  Resp: 16 18 20 18   Temp: 98.4 F (36.9 C) (!) 97.5 F (36.4 C) 98 F (36.7 C) 98.1 F (36.7 C)  TempSrc: Oral Oral Oral Oral  SpO2: 97% 99% 100% 96%  Weight:   102.6 kg   Height:        Intake/Output Summary (Last 24 hours) at 10/05/2017 1448 Last data filed at 10/05/2017 1055 Gross per 24 hour  Intake 6218.14 ml  Output -  Net 6218.14 ml   Filed Weights   10/03/17 0459 10/04/17 0427 10/05/17 0406  Weight: 99.7 kg 100 kg 102.6 kg    Examination: General exam: Appears comfortable  HEENT: PERRLA, oral mucosa moist, no sclera icterus or thrush Respiratory system: Clear to auscultation. Respiratory effort normal. Cardiovascular system: S1 & S2 heard, RRR.   Gastrointestinal system: Abdomen soft,  Tender in epigastrium and LUQ, nondistended. Normal bowel sound. No organomegaly Central nervous system: Alert and oriented. No focal neurological deficits. Extremities: No cyanosis, clubbing or edema Skin: No rashes or ulcers Psychiatry:  Mood & affect appropriate.  Data Reviewed: I have personally reviewed following labs and imaging studies  CBC: Recent Labs  Lab 10/01/17 0356  WBC 7.8  NEUTROABS 5.9  HGB 12.8*  HCT 37.7*  MCV 94.5  PLT 960   Basic Metabolic Panel: Recent Labs  Lab 09/29/17 0511 10/01/17 0356 10/02/17 0352 10/05/17 0451  NA 137 141 139 140  K 4.0 3.4* 3.9 4.5  CL 102 103 104 104  CO2 24 28 27 28   GLUCOSE 94 94 102* 109*  BUN 9 8 6 10   CREATININE 0.74 0.87 0.73 0.98  CALCIUM 8.7* 8.8* 8.6* 9.2  MG  --  1.9  --   --    GFR: Estimated Creatinine Clearance: 128.7 mL/min (by C-G formula based on SCr of 0.98 mg/dL). Liver Function Tests: Recent Labs  Lab  09/29/17 0511  AST 15  ALT 20  ALKPHOS 68  BILITOT 0.7  PROT 7.0  ALBUMIN 3.6   Recent Labs  Lab 09/29/17 0511 10/03/17 1214  LIPASE 42 37   No results for input(s): AMMONIA in the last 168 hours. Coagulation Profile: No results for input(s): INR, PROTIME in the last 168 hours. Cardiac Enzymes: No results for input(s): CKTOTAL, CKMB, CKMBINDEX, TROPONINI in the last 168 hours. BNP (last 3 results) No results for input(s): PROBNP in the last 8760 hours. HbA1C: No results for input(s): HGBA1C in the last 72 hours. CBG: No results for input(s): GLUCAP in the last 168 hours. Lipid Profile: No results for input(s): CHOL, HDL, LDLCALC, TRIG, CHOLHDL, LDLDIRECT in the last 72 hours. Thyroid Function Tests: No results for input(s): TSH, T4TOTAL, FREET4, T3FREE, THYROIDAB in the last 72 hours. Anemia Panel: No results for input(s): VITAMINB12, FOLATE, FERRITIN, TIBC, IRON, RETICCTPCT in the last 72 hours. Urine analysis:    Component Value Date/Time   COLORURINE STRAW (A) 09/26/2017 1636   APPEARANCEUR CLEAR 09/26/2017 1636   LABSPEC 1.009 09/26/2017 1636   PHURINE 8.0 09/26/2017 1636   GLUCOSEU NEGATIVE 09/26/2017 1636   HGBUR NEGATIVE 09/26/2017 1636   BILIRUBINUR NEGATIVE 09/26/2017 1636   KETONESUR NEGATIVE 09/26/2017 1636   PROTEINUR NEGATIVE 09/26/2017 1636   UROBILINOGEN 0.2 09/19/2009 1400   NITRITE NEGATIVE 09/26/2017 1636   LEUKOCYTESUR TRACE (A) 09/26/2017 1636   Sepsis Labs: @LABRCNTIP (procalcitonin:4,lacticidven:4) )No results found for this or any previous visit (from the past 240 hour(s)).       Radiology Studies: No results found.    Scheduled Meds: . feeding supplement  1 Container Oral TID BM  . pantoprazole  80 mg Oral QHS  . sertraline  50 mg Oral Daily   Continuous Infusions: . sodium chloride 75 mL/hr at 10/05/17 1058     LOS: 9 days    Time spent in minutes: 35    Debbe Odea, MD [facility] Pager: [email or web address] Password [facility] 10/05/2017, 2:48 PM

## 2017-10-06 DIAGNOSIS — R1013 Epigastric pain: Secondary | ICD-10-CM

## 2017-10-06 LAB — BASIC METABOLIC PANEL
Anion gap: 9 (ref 5–15)
BUN: 11 mg/dL (ref 6–20)
CO2: 29 mmol/L (ref 22–32)
CREATININE: 0.94 mg/dL (ref 0.61–1.24)
Calcium: 9.7 mg/dL (ref 8.9–10.3)
Chloride: 103 mmol/L (ref 98–111)
GFR calc Af Amer: >60 mL/min (ref >60)
GLUCOSE: 96 mg/dL (ref 70–99)
POTASSIUM: 4.7 mmol/L (ref 3.5–5.1)
SODIUM: 141 mmol/L (ref 135–145)

## 2017-10-06 MED ORDER — FAMOTIDINE IN NACL 20-0.9 MG/50ML-% IV SOLN
20.0000 mg | Freq: Two times a day (BID) | INTRAVENOUS | Status: DC
  Administered 2017-10-06 – 2017-10-08 (×4): 20 mg via INTRAVENOUS
  Filled 2017-10-06 (×4): qty 50

## 2017-10-06 NOTE — Progress Notes (Signed)
PROGRESS NOTE    Andre Simmons   KGY:[id number]  DOB: 07-Oct-1975  DOA: 09/26/2017 PCP: Maryella Shivers, MD   Brief Narrative:  Andre Simmons is a 42 y/o male with was found to have a mass in the tail of his pancreas and underwent an EUS and biopsy on 09/25/17 by Dr Ardis Hughs and presented the following day with upper abdominal pain and was found to have acute pancreatitis. He was treated conservatively and slowly advanced to solid food but could not tolerate his first meal which was a chicken sand which.    Subjective: Nausea has resolved but epigastric and left abdominal persists. No BMs since I last saw him yesterday.      Assessment & Plan:   Principal Problem:   Acute pancreatitis - post EUS - unable to advance to solids- repeat CT showed worsening pancreatitis - GI consulted for further input as his symptoms are not resolving-    - 8/11> still complains of pain and ongoing nausea - insists that his pancreatitis is not improving- I have discussed this with Dr Lyndel Safe who agrees with me that this is likely no longer acute pancreatitis- do to ongoing pain, and nausea will make him NPO - - cont to follow for improvement in pain- nausea has improved- maintain NPO status and IVF  Active Problems:   Pancreatic lesion - he has a 3.5 cm mass in the tail of the pancreas with about 30 lb wt loss - has been referred to general surgery and will need to wait at least 3 wks due to acute panceatitis prior ot surgical resection, per Dr Barry Dienes  Hypokalemia - replaced orally and IV     Chronic back pain - on Percocets at home  DVT prophylaxis: SCDs Code Status: Full code Family Communication:  Disposition Plan: home when symptoms resolve Consultants:   GI Procedures:   none Antimicrobials:  Anti-infectives (From admission, onward)   None       Objective: Vitals:   10/05/17 0406 10/05/17 1301 10/05/17 2142 10/06/17 0703  BP: 122/86 126/85 (!) 124/92 131/81  Pulse: 61 66 (!)  56 (!) 50  Resp: 20 18 20 20   Temp: 98 F (36.7 C) 98.1 F (36.7 C) 98.2 F (36.8 C) 98 F (36.7 C)  TempSrc: Oral Oral Oral Oral  SpO2: 100% 96% 96% 96%  Weight: 102.6 kg   92.4 kg  Height:        Intake/Output Summary (Last 24 hours) at 10/06/2017 1209 Last data filed at 10/06/2017 0600 Gross per 24 hour  Intake 1360.91 ml  Output -  Net 1360.91 ml   Filed Weights   10/04/17 0427 10/05/17 0406 10/06/17 0703  Weight: 100 kg 102.6 kg 92.4 kg    Examination: General exam: Appears quite comfortable  HEENT: PERRLA, oral mucosa moist, no sclera icterus or thrush Respiratory system: Clear to auscultation. Respiratory effort normal. Cardiovascular system: S1 & S2 heard, RRR.   Gastrointestinal system: Abdomen soft, continues to be tender in epigastrium and LUQ, nondistended. Normal bowel sound. No organomegaly Central nervous system: Alert and oriented. No focal neurological deficits. Extremities: No cyanosis, clubbing or edema Skin: No rashes or ulcers Psychiatry:  Mood & affect appropriate.     Data Reviewed: I have personally reviewed following labs and imaging studies  CBC: Recent Labs  Lab 10/01/17 0356  WBC 7.8  NEUTROABS 5.9  HGB 12.8*  HCT 37.7*  MCV 94.5  PLT 026   Basic Metabolic Panel: Recent Labs  Lab  10/01/17 0356 10/02/17 0352 10/05/17 0451 10/06/17 0523  NA 141 139 140 141  K 3.4* 3.9 4.5 4.7  CL 103 104 104 103  CO2 28 27 28 29   GLUCOSE 94 102* 109* 96  BUN 8 6 10 11   CREATININE 0.87 0.73 0.98 0.94  CALCIUM 8.8* 8.6* 9.2 9.7  MG 1.9  --   --   --    GFR: Estimated Creatinine Clearance: 123.6 mL/min (by C-G formula based on SCr of 0.94 mg/dL). Liver Function Tests: No results for input(s): AST, ALT, ALKPHOS, BILITOT, PROT, ALBUMIN in the last 168 hours. Recent Labs  Lab 10/03/17 1214  LIPASE 37   No results for input(s): AMMONIA in the last 168 hours. Coagulation Profile: No results for input(s): INR, PROTIME in the last 168  hours. Cardiac Enzymes: No results for input(s): CKTOTAL, CKMB, CKMBINDEX, TROPONINI in the last 168 hours. BNP (last 3 results) No results for input(s): PROBNP in the last 8760 hours. HbA1C: No results for input(s): HGBA1C in the last 72 hours. CBG: No results for input(s): GLUCAP in the last 168 hours. Lipid Profile: No results for input(s): CHOL, HDL, LDLCALC, TRIG, CHOLHDL, LDLDIRECT in the last 72 hours. Thyroid Function Tests: No results for input(s): TSH, T4TOTAL, FREET4, T3FREE, THYROIDAB in the last 72 hours. Anemia Panel: No results for input(s): VITAMINB12, FOLATE, FERRITIN, TIBC, IRON, RETICCTPCT in the last 72 hours. Urine analysis:    Component Value Date/Time   COLORURINE STRAW (A) 09/26/2017 1636   APPEARANCEUR CLEAR 09/26/2017 1636   LABSPEC 1.009 09/26/2017 1636   PHURINE 8.0 09/26/2017 1636   GLUCOSEU NEGATIVE 09/26/2017 1636   HGBUR NEGATIVE 09/26/2017 1636   BILIRUBINUR NEGATIVE 09/26/2017 1636   KETONESUR NEGATIVE 09/26/2017 1636   PROTEINUR NEGATIVE 09/26/2017 1636   UROBILINOGEN 0.2 09/19/2009 1400   NITRITE NEGATIVE 09/26/2017 1636   LEUKOCYTESUR TRACE (A) 09/26/2017 1636   Sepsis Labs: @LABRCNTIP (procalcitonin:4,lacticidven:4) )No results found for this or any previous visit (from the past 240 hour(s)).       Radiology Studies: No results found.    Scheduled Meds: . feeding supplement  1 Container Oral TID BM  . pantoprazole  80 mg Oral QHS  . sertraline  50 mg Oral Daily   Continuous Infusions: . sodium chloride 75 mL/hr at 10/06/17 0600     LOS: 10 days    Time spent in minutes: 35    Debbe Odea, MD [facility] Pager: [email or web address] Password [facility] 10/06/2017, 12:09 PM

## 2017-10-07 DIAGNOSIS — K869 Disease of pancreas, unspecified: Secondary | ICD-10-CM

## 2017-10-07 DIAGNOSIS — M549 Dorsalgia, unspecified: Secondary | ICD-10-CM

## 2017-10-07 DIAGNOSIS — G8929 Other chronic pain: Secondary | ICD-10-CM

## 2017-10-07 DIAGNOSIS — K859 Acute pancreatitis without necrosis or infection, unspecified: Secondary | ICD-10-CM

## 2017-10-07 LAB — CBC
HEMATOCRIT: 43.2 % (ref 39.0–52.0)
HEMOGLOBIN: 14.8 g/dL (ref 13.0–17.0)
MCH: 31.8 pg (ref 26.0–34.0)
MCHC: 34.3 g/dL (ref 30.0–36.0)
MCV: 92.7 fL (ref 78.0–100.0)
Platelets: 405 10*3/uL — ABNORMAL HIGH (ref 150–400)
RBC: 4.66 MIL/uL (ref 4.22–5.81)
RDW: 12.5 % (ref 11.5–15.5)
WBC: 8.1 10*3/uL (ref 4.0–10.5)

## 2017-10-07 LAB — BASIC METABOLIC PANEL
Anion gap: 12 (ref 5–15)
BUN: 12 mg/dL (ref 6–20)
CALCIUM: 8.9 mg/dL (ref 8.9–10.3)
CHLORIDE: 102 mmol/L (ref 98–111)
CO2: 24 mmol/L (ref 22–32)
Creatinine, Ser: 0.85 mg/dL (ref 0.61–1.24)
GFR calc non Af Amer: >60 mL/min (ref >60)
GLUCOSE: 90 mg/dL (ref 70–99)
Potassium: 3.8 mmol/L (ref 3.5–5.1)
Sodium: 138 mmol/L (ref 135–145)

## 2017-10-07 MED ORDER — BISACODYL 5 MG PO TBEC: 5.0000 mg | DELAYED_RELEASE_TABLET | Freq: Every day | ORAL | 0 refills | Status: DC | PRN

## 2017-10-07 MED ORDER — HYDROMORPHONE HCL 1 MG/ML IJ SOLN
1.0000 mg | Freq: Once | INTRAMUSCULAR | Status: AC
Start: 2017-10-07 — End: 2017-10-07
  Administered 2017-10-07: 1 mg via INTRAVENOUS
  Filled 2017-10-07: qty 1

## 2017-10-07 MED ORDER — HYDROMORPHONE HCL 2 MG PO TABS
2.0000 mg | ORAL_TABLET | ORAL | Status: AC | PRN
  Administered 2017-10-08 (×2): 2 mg via ORAL
  Filled 2017-10-07 (×2): qty 1

## 2017-10-07 MED ORDER — OXYCODONE HCL ER 10 MG PO T12A
10.0000 mg | EXTENDED_RELEASE_TABLET | Freq: Two times a day (BID) | ORAL | Status: DC
Start: 2017-10-07 — End: 2017-10-09
  Administered 2017-10-07 – 2017-10-09 (×5): 10 mg via ORAL
  Filled 2017-10-07 (×5): qty 1

## 2017-10-07 MED ORDER — LORAZEPAM 2 MG/ML IJ SOLN
1.0000 mg | Freq: Once | INTRAMUSCULAR | Status: AC
  Administered 2017-10-07: 1 mg via INTRAVENOUS
  Filled 2017-10-07: qty 1

## 2017-10-07 MED ORDER — DOCUSATE SODIUM 100 MG PO CAPS: 100.0000 mg | ORAL_CAPSULE | Freq: Every day | ORAL | Status: DC | PRN

## 2017-10-07 MED ORDER — LORAZEPAM 1 MG PO TABS
1.0000 mg | ORAL_TABLET | Freq: Once | ORAL | Status: AC
  Administered 2017-10-08: 1 mg via ORAL
  Filled 2017-10-07: qty 1

## 2017-10-07 MED ORDER — METOCLOPRAMIDE HCL 5 MG PO TABS
5.0000 mg | ORAL_TABLET | Freq: Four times a day (QID) | ORAL | Status: DC | PRN
Start: 2017-10-07 — End: 2017-10-09

## 2017-10-07 MED ORDER — BISACODYL 5 MG PO TBEC: 5.0000 mg | DELAYED_RELEASE_TABLET | Freq: Every day | ORAL | Status: DC | PRN

## 2017-10-07 MED ORDER — ACETAMINOPHEN 500 MG PO TABS
1000.0000 mg | ORAL_TABLET | Freq: Three times a day (TID) | ORAL | Status: DC
  Administered 2017-10-07 – 2017-10-08 (×4): 1000 mg via ORAL
  Filled 2017-10-07 (×5): qty 2

## 2017-10-07 MED ORDER — HYDROMORPHONE HCL 1 MG/ML IJ SOLN
1.0000 mg | INTRAMUSCULAR | Status: DC | PRN
  Administered 2017-10-07 (×2): 1 mg via INTRAVENOUS
  Filled 2017-10-07 (×2): qty 1

## 2017-10-07 MED ORDER — OXYCODONE-ACETAMINOPHEN 5-325 MG PO TABS
2.0000 | ORAL_TABLET | ORAL | Status: DC | PRN
  Administered 2017-10-07: 2 via ORAL
  Filled 2017-10-07: qty 2

## 2017-10-07 NOTE — Progress Notes (Signed)
AC spoke with patient in regards to discharge. Patient is appealing discharge.

## 2017-10-07 NOTE — Progress Notes (Signed)
Contacted Dr. Quincy Simmonds in regards to patient's concern about ineffective pain management and wanting to be discharged. Advised patient that regimen will remain the same per doctor. Confirmed with patient that he does indeed wish to be discharged.

## 2017-10-07 NOTE — Progress Notes (Signed)
PROGRESS NOTE  Andre Simmons  GEZ:[id number] DOB: 09-21-1975 DOA: 09/26/2017 PCP: Maryella Shivers, MD   Brief Narrative:  Andre Simmons is a 42 y.o. male with a past medical history significant for GERD, chronic back pain, and a known 3.5 cm pancreatic mass. He presented to the emergency department on 8/2 complaining of a 1 day history of stabbing epigastric abdominal pain and 2 episodes of vomiting. He had had a EUS and FNA of pancreatic mass the day prior by Dr. Ardis Hughs. In the ED, CT abdomen/pelvis was consistent with acute pancreatitis, lipase 283. He was admitted to the hospital with the diagnosis of acute pancreatitis. He was treated conservatively with IVF, pain control, and bowel rest.  Subjective: Patient is doing okay this morning. He still reports 9/10 epigastric and LUQ abdominal pain that he describes as "soreness," but it is slightly improved since yesterday. He has had no further nausea or vomiting episodes and feels this is attributed to eating. He reports no BM in two day. No fevers, chills, diarrhea, CP, SOB or urinary symptoms.   Assessment & Plan:  Acute Pancreatitis following EUS/FNA on 8/1 - Repeat CT scan on 8/6 showed worsening pancreatitis. Lipase on 8/9 down to 37. GI consulted and recommended continue conservative treatment - signed off. - Patient had nausea when diet was advanced earlier in the stay. Will advance diet slowly from NPO - started on clear liquids today. He has Zofran and Reglan prn for nausea.  - He is having persistent pain - this is likely more related to his known pancreatic mass. Added scheduled oxycodone q 12 hours. Continue IV dilaudid q 4 hours for break through pain.  - He has not had BM in 2 days, likely from opioid use. Added dulcolax and colace prn.  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 Laroy Apple outpatient  to discuss plans for pancreatectomy/splenectomy after resolution of acute pancreatitis.   Chronic back pain - He is on Percocet at home. Transitioning to PO pain medicine today.  GERD - On prilosec 40 mg at home. Pepcid IV for now. Will transition once good PO intake.  DVT prophylaxis: SCDs Code Status: FULL Family Communication: No family at bedside  Disposition Plan: Home in 1-2 days pending pain control with oral medication and improved PO intake.   Consultants:   GI  General Surgery  Procedures:   None  Antimicrobials:   None   Objective: Vitals:   10/06/17 0703 10/06/17 1239 10/06/17 2052 10/07/17 0522  BP: 131/81 115/86 (!) 135/96 95/64  Pulse: (!) 50 (!) 58 (!) 58 82  Resp: 20 16 16 15   Temp: 98 F (36.7 C) 98 F (36.7 C) 99.2 F (37.3 C) 97.6 F (36.4 C)  TempSrc: Oral Oral Oral Oral  SpO2: 96% 98% 95% 96%  Weight: 92.4 kg   96.9 kg  Height:        Intake/Output Summary (Last 24 hours) at 10/07/2017 1131 Last data filed at 10/07/2017 0400 Gross per 24 hour  Intake 1655.72 ml  Output -  Net 1655.72 ml   Filed Weights   10/05/17 0406 10/06/17 0703 10/07/17 0522  Weight: 102.6 kg 92.4 kg 96.9 kg    Examination: General appearance: adult male, awake and alert. NAD.   HEENT: Anicteric, conjunctiva pink, mucous membranes moist Skin: Warm and dry. No jaundice.  No suspicious rashes or lesions. Cardiac: RRR, nl  S1-S2, no murmurs appreciated. No LE edema. No cyanosis. Distal pulses are intact bilaterally. Respiratory: Normal respiratory rate and rhythm. CTAB without wheezes, crackles or rales. Abdomen: Abdomen soft and non-distended. Mild TTP in LUQ and epigastric. Positive bowel sounds throughout. MSK: No deformities or effusions. Neuro: AOx3. Moves all extremities. Speech fluent.    Psych: Attention normal. Affect normal. Judgment and insight appear normal.  Data Reviewed: I have personally reviewed following labs and imaging studies:  CBC: Recent  Labs  Lab 2017-10-18 0356 10/07/17 0509  WBC 7.8 8.1  NEUTROABS 5.9  --   HGB 12.8* 14.8  HCT 37.7* 43.2  MCV 94.5 92.7  PLT 283 098*   Basic Metabolic Panel: Recent Labs  Lab 18-Oct-2017 0356 10/02/17 0352 10/05/17 0451 10/06/17 0523 10/07/17 0509  NA 141 139 140 141 138  K 3.4* 3.9 4.5 4.7 3.8  CL 103 104 104 103 102  CO2 28 27 28 29 24   GLUCOSE 94 102* 109* 96 90  BUN 8 6 10 11 12   CREATININE 0.87 0.73 0.98 0.94 0.85  CALCIUM 8.8* 8.6* 9.2 9.7 8.9  MG 1.9  --   --   --   --    GFR: Estimated Creatinine Clearance: 136.7 mL/min (by C-G formula based on SCr of 0.85 mg/dL).  Recent Labs  Lab 10/03/17 1214  LIPASE 37   Urine analysis:    Component Value Date/Time   COLORURINE STRAW (A) 09/26/2017 1636   APPEARANCEUR CLEAR 09/26/2017 1636   LABSPEC 1.009 09/26/2017 1636   PHURINE 8.0 09/26/2017 1636   GLUCOSEU NEGATIVE 09/26/2017 1636   HGBUR NEGATIVE 09/26/2017 1636   BILIRUBINUR NEGATIVE 09/26/2017 1636   KETONESUR NEGATIVE 09/26/2017 1636   PROTEINUR NEGATIVE 09/26/2017 1636   UROBILINOGEN 0.2 09/19/2009 1400   NITRITE NEGATIVE 09/26/2017 1636   LEUKOCYTESUR TRACE (A) 09/26/2017 1636   Scheduled Meds: . feeding supplement  1 Container Oral TID BM  . oxyCODONE  10 mg Oral Q12H  . sertraline  50 mg Oral Daily   Continuous Infusions: . sodium chloride 75 mL/hr at 10/07/17 0400  . famotidine (PEPCID) IV 20 mg (10/07/17 0937)    LOS: 11 days   Levelle Edelen, PA-S [facility] 10/07/2017, 11:31 AM   [email or web address] Password [email or web address] If 7PM-7AM, please contact night-coverage

## 2017-10-07 NOTE — Progress Notes (Signed)
Patient asked to speak to chief of medical staff before he leaves. Once patient was notified of discharge paperwork he stated that he can't be discharged due to his insurance. Charge nurse spoke with patient and contacted AC. Dr. Quincy Simmonds was notified and stated to let [facility] handle situation.

## 2017-10-07 NOTE — Progress Notes (Signed)
30 Day Unplanned Readmission Risk Score     ED to Hosp-Admission (Current) from 09/26/2017 in [facility]  30 Day Unplanned Readmission Risk Score (%)  9 Filed at 10/07/2017 1200     This score is the patient's risk of an unplanned readmission within 30 days of being discharged (0 -100%). The score is based on dignosis, age, lab data, medications, orders, and past utilization.   Low:  0-14.9   Medium: 15-21.9   High: 22-29.9   Extreme: 30 and above        Readmission Risk Prevention Plan 10/07/2017  Post Dischage Appt Complete  Medication Screening Complete  Transportation Screening Complete  PCP follow-up Complete  Some recent data might be hidden

## 2017-10-07 NOTE — Discharge Summary (Signed)
Physician Discharge Summary  Andre Simmons  ZJQ:[id number]  DOB: 10/24/1975  DOA: 09/26/2017 PCP: Maryella Shivers, MD  Admit date: 09/26/2017 Discharge date: 10/07/2017  Admitted From: Home Disposition: Home  Recommendations for Outpatient Follow-up:  1. Follow up with PCP in 1 week 2. Follow-up with general surgery on 8/16 to evaluate pancreatic mass  Discharge Condition: Stable  CODE STATUS: Full Code  Diet recommendation: Heart Healthy   Brief/Interim Summary: For full details see H&P/Progress note, but in brief, Andre Simmons is a 42 year old male with past medical history significant for GERD, chronic back pain and known 3.5 cm pancreatic mass presented to the emergency department on 8/2 complaining of 1 day history of abdominal pain.  Patient prior to admission had EUS and FNA of pancreatic mass by Dr. Ardis Hughs.  In the ED CT abdomen and pelvis was consistent with acute pancreatitis with elevated lipase.  Patient was admitted with working diagnosis of acute pancreatitis and treated conservatively with IVF, pain control and bowel rest.  During hospital stay patient initially had mild improvement however after resuming diet became worse.  GI was consulted and CT of the abdomen was repeated on 8/6 showing worsening pancreatitis.  Subsequently patient was placed n.p.o. again and treated with pain medication.  Patient has been n.p.o. for 3 to 4 days, lipase levels were normal and pain persisted.  GI felt that pain was related to pancreatic mass and recommended to advance diet and continue pain control as needed.   Subjective: Patient seen and examined, upon arrival patient seems very comfortable and reported he is doing okay this morning describing soreness on his belly however report the pain is 9 out of 10.  Patient has been getting IV Diluadid on the clock in combination with p.o. Dilaudid around-the-clock even that is scheduled as needed.  Since patient was not vomiting or having nausea  I discussed in a skilled pain management with oxycodone every 12 hours and IV Dilaudid as needed for breakthrough pain every 4 hours.  Patient agreed with treatment.  Later during the day patient reported that he wanted to continue only on Dilaudid IV and p.o.  Patient reported not having BM in 2 days.  Continue to request IV Dilaudid and p.o. Dilaudid and decide that he wants to be discharged if he is not getting that regimen.  Since patient is stable and pancreatitis has resolved, he is tolerating clear liquid diet I deemed patient stable for discharge and follow-up as an outpatient for pancreatic mass evaluation.  Patient has an appointment on 8/16 with Dr.Byrely.   Discharge Diagnoses/Hospital Course:  Principal Problem:   Acute pancreatitis Active Problems:   Pancreatic lesion   Chronic back pain Acute pancreatitis due to EUS/FNA on 8/1 Pancreatitis episode seems to be resolved, with lipase 37.  GI was consulted and recommended conservative treatment and has signed off.  Patient clinically improving but complaining of abdominal pain which was felt to be related to pancreatic mass.  Patient was placed on oxycodone every 12 and IV Dilaudid every 4 hours.  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  Exline  Chronic back pain He is on Percocet at home, he can continue this upon discharge   GERD Continue Prilosec.  Discharge Instructions  You were cared for by a hospitalist during your hospital stay.  If you have any questions about your discharge medications or the care you received while you were in the hospital after you are discharged, you can call the unit and asked to speak with the hospitalist on call if the hospitalist that took care of you is not available. Once you are  discharged, your primary care physician will handle any further medical issues. Please note that NO REFILLS for any discharge medications will be authorized once you are discharged, as it is imperative that you return to your primary care physician (or establish a relationship with a primary care physician if you do not have one) for your aftercare needs so that they can reassess your need for medications and monitor your lab values.  Discharge Instructions    Call MD for:  difficulty breathing, headache or visual disturbances   Complete by:  As directed    Call MD for:  extreme fatigue   Complete by:  As directed    Call MD for:  hives   Complete by:  As directed    Call MD for:  persistant dizziness or light-headedness   Complete by:  As directed    Call MD for:  persistant nausea and vomiting   Complete by:  As directed    Call MD for:  redness, tenderness, or signs of infection (pain, swelling, redness, odor or green/yellow discharge around incision site)   Complete by:  As directed    Call MD for:  severe uncontrolled pain   Complete by:  As directed    Call MD for:  temperature >100.4   Complete by:  As directed    Diet - low sodium heart healthy   Complete by:  As directed    Increase activity slowly   Complete by:  As directed      Allergies as of 10/07/2017   No Known Allergies     Medication List    STOP taking these medications   ondansetron 8 MG disintegrating tablet Commonly known as:  ZOFRAN-ODT     TAKE these medications   bisacodyl 5 MG EC tablet Commonly known as:  DULCOLAX Take 1 tablet (5 mg total) by mouth daily as needed for moderate constipation.   omeprazole 40 MG capsule Commonly known as:  PRILOSEC Take 40 mg by mouth 2 (two) times daily.   oxyCODONE-acetaminophen 5-325 MG tablet Commonly known as:  PERCOCET/ROXICET Take 1 tablet by mouth every 6 (six) hours as needed for severe pain.   promethazine 12.5 MG tablet Commonly known as:   PHENERGAN Take 1 tablet (12.5 mg total) by mouth every 8 (eight) hours as needed for nausea or vomiting.   sertraline 50 MG tablet Commonly known as:  ZOLOFT Take 50 mg by mouth daily.      Follow-up Information    Stark Klein, MD. Go on 10/16/2017.   Specialty:  General Surgery Why:  Please arrive at address above to see Dr. Barry Dienes at 1:30 for your 2:00 appointment. Thank you. Contact information: [address] [place] [address] [phone]        Maryella Shivers, MD. Schedule an appointment as soon as possible for a visit in 1 week(s).   Specialty:  Family Medicine Why:  Hospital follow up  Contact information: [place] [address] [phone]          No Known Allergies  Consultations:  GI   General surgery    Procedures/Studies: Ct Abdomen Pelvis W Contrast  Result Date: 09/30/2017 CLINICAL DATA:  42 year old male with history of pancreatitis. Left upper quadrant abdominal pain. EXAM: CT ABDOMEN AND PELVIS WITH CONTRAST TECHNIQUE: Multidetector CT imaging of the abdomen and pelvis was performed using the standard protocol following bolus administration of intravenous contrast. CONTRAST:  80mL ISOVUE-300 IOPAMIDOL (ISOVUE-300) INJECTION 61%, 162mL OMNIPAQUE IOHEXOL 300 MG/ML SOLN COMPARISON:  CT the abdomen and pelvis 09/26/2017. FINDINGS: Lower chest: Increasing areas of subsegmental atelectasis in the left lower lobe. Hepatobiliary: No suspicious cystic or solid hepatic lesions. No intra or extrahepatic biliary ductal dilatation. Gallbladder is normal in appearance. Pancreas: Increasing inflammatory changes adjacent to the distal body and tail of the pancreas, indicative of acute pancreatitis. Head and proximal body of the pancreas are generally normal in appearance. Previously described soft tissue mass with internal calcifications in the tail of the pancreas is again noted, but difficult to discretely measure given  the surrounding inflammatory changes. Spleen: Unremarkable. Adrenals/Urinary Tract: Bilateral kidneys and adrenal glands are normal in appearance. No hydroureteronephrosis. Urinary bladder is normal. Bilateral adrenal glands are normal in appearance. Stomach/Bowel: Normal appearance of the stomach. No pathologic dilatation of small bowel or colon. Normal appendix. Vascular/Lymphatic: Aortic atherosclerosis. No aneurysm identified in the visualized abdominal vasculature. No lymphadenopathy noted in the abdomen. Other: Inflammatory changes adjacent to the distal body and tail of the pancreas with a trace amount of fluid tracking in the left pericolic gutter. No larger volume of ascites. No pneumoperitoneum. Reproductive: Prostate gland and seminal vesicles are unremarkable in appearance. Musculoskeletal: There are no aggressive appearing lytic or blastic lesions noted in the visualized portions of the skeleton. IMPRESSION: 1. Worsening inflammatory changes adjacent to the distal body and tail of the pancreas, compatible with progressive pancreatitis. No pancreatic pseudocyst or other complicating features noted at this time. Previously demonstrated partially calcified soft tissue mass in the tail of the pancreas is partially obscured on today's examination, but attention on future follow-up studies is recommended. 2. Aortic atherosclerosis. Electronically Signed   By: Vinnie Langton M.D.   On: 09/30/2017 20:28   Ct Abdomen Pelvis W Contrast  Result Date: 09/26/2017 CLINICAL DATA:  LEFT UPPER QUADRANT abdominal pain radiating into the back, 1 day post endoscopic ultrasound biopsy of a mass involving the pancreatic tail. EXAM: CT ABDOMEN AND PELVIS WITH CONTRAST TECHNIQUE: Multidetector CT imaging of the abdomen and pelvis was performed using the standard protocol following bolus administration of intravenous contrast. CONTRAST:  162mL ISOVUE-300 IOPAMIDOL INJECTION 61% IV. COMPARISON:  MRI abdomen 09/11/2017. CT  abdomen 09/05/2017, 01/29/2016. FINDINGS: Lower chest: Heart size normal.  Visualized lung bases clear. Hepatobiliary: Liver normal in size and appearance. Gallbladder normal in appearance without calcified gallstones. No biliary ductal dilation. Pancreas: Mild edema/inflammation surrounding the body and tail of the pancreas, new since the most recent prior MRI and CT. The calcified heterogeneous soft tissue mass involving the pancreatic tail is unchanged, measuring approximately 3.8 x 3.2 cm (series 2, image 36). Remainder of the pancreas remains normal in appearance. Spleen: Normal in size and appearance. Adrenals/Urinary Tract: Normal appearing adrenal glands. Kidneys normal in size and appearance without focal parenchymal abnormality. No evidence of urinary tract calculi or obstruction. Normal-appearing urinary bladder. Stomach/Bowel: Stomach normal in appearance for the degree of distention. Normal-appearing small bowel. Scattered sigmoid colon diverticula without evidence of acute diverticulitis. Remainder of the colon normal in appearance. Normal caliber appendix containing a small appendicolith, without evidence of periappendiceal inflammation. Vascular/Lymphatic: Aortoiliac atherosclerosis, advanced for patient age, without evidence of aneurysm. Normal-appearing portal venous  and systemic venous systems. No pathologic lymphadenopathy. Reproductive: Prostate gland and seminal vesicles normal in size and appearance for age. Other: None. Musculoskeletal: Degenerative disc disease, spondylosis and facet degenerative changes at L5-S1. No acute findings. IMPRESSION: 1. Acute pancreatitis involving the body and tail of the pancreas. 2. Stable calcified mass involving the pancreatic tail. 3. No evidence of metastatic disease. 4.  Aortic Atherosclerosis, advanced for patient age.  (ICD10-170.0) Electronically Signed   By: Evangeline Dakin M.D.   On: 09/26/2017 17:07    Discharge Exam: Vitals:   10/07/17 0522  10/07/17 1410  BP: 95/64 109/74  Pulse: 82 82  Resp: 15 16  Temp: 97.6 F (36.4 C) 98.2 F (36.8 C)  SpO2: 96% 94%   Vitals:   10/06/17 1239 10/06/17 2052 10/07/17 0522 10/07/17 1410  BP: 115/86 (!) 135/96 95/64 109/74  Pulse: (!) 58 (!) 58 82 82  Resp: 16 16 15 16   Temp: 98 F (36.7 C) 99.2 F (37.3 C) 97.6 F (36.4 C) 98.2 F (36.8 C)  TempSrc: Oral Oral Oral Oral  SpO2: 98% 95% 96% 94%  Weight:   96.9 kg   Height:        General: NAD comfortable, lying in bed.  Cardiovascular: RRR, S1/S2 + Respiratory: CTA bilaterally Abdominal: Soft, mild tenderness in epigastric area, +BS  Extremities: no edema   The results of significant diagnostics from this hospitalization (including imaging, microbiology, ancillary and laboratory) are listed below for reference.     Microbiology: No results found for this or any previous visit (from the past 240 hour(s)).   Labs: BNP (last 3 results) No results for input(s): BNP in the last 8760 hours. Basic Metabolic Panel: Recent Labs  Lab 10/01/17 0356 10/02/17 0352 10/05/17 0451 10/06/17 0523 10/07/17 0509  NA 141 139 140 141 138  K 3.4* 3.9 4.5 4.7 3.8  CL 103 104 104 103 102  CO2 28 27 28 29 24   GLUCOSE 94 102* 109* 96 90  BUN 8 6 10 11 12   CREATININE 0.87 0.73 0.98 0.94 0.85  CALCIUM 8.8* 8.6* 9.2 9.7 8.9  MG 1.9  --   --   --   --    Liver Function Tests: No results for input(s): AST, ALT, ALKPHOS, BILITOT, PROT, ALBUMIN in the last 168 hours. Recent Labs  Lab 10/03/17 1214  LIPASE 37   No results for input(s): AMMONIA in the last 168 hours. CBC: Recent Labs  Lab 10/01/17 0356 10/07/17 0509  WBC 7.8 8.1  NEUTROABS 5.9  --   HGB 12.8* 14.8  HCT 37.7* 43.2  MCV 94.5 92.7  PLT 283 405*   Cardiac Enzymes: No results for input(s): CKTOTAL, CKMB, CKMBINDEX, TROPONINI in the last 168 hours. BNP: Invalid input(s): POCBNP CBG: No results for input(s): GLUCAP in the last 168 hours. D-Dimer No results for  input(s): DDIMER in the last 72 hours. Hgb A1c No results for input(s): HGBA1C in the last 72 hours. Lipid Profile No results for input(s): CHOL, HDL, LDLCALC, TRIG, CHOLHDL, LDLDIRECT in the last 72 hours. Thyroid function studies No results for input(s): TSH, T4TOTAL, T3FREE, THYROIDAB in the last 72 hours.  Invalid input(s): FREET3 Anemia work up No results for input(s): VITAMINB12, FOLATE, FERRITIN, TIBC, IRON, RETICCTPCT in the last 72 hours. Urinalysis    Component Value Date/Time   COLORURINE STRAW (A) 09/26/2017 1636   APPEARANCEUR CLEAR 09/26/2017 1636   LABSPEC 1.009 09/26/2017 1636   PHURINE 8.0 09/26/2017 1636   GLUCOSEU NEGATIVE 09/26/2017  [place] 09/26/2017 1636   BILIRUBINUR NEGATIVE 09/26/2017 1636   KETONESUR NEGATIVE 09/26/2017 1636   PROTEINUR NEGATIVE 09/26/2017 1636   UROBILINOGEN 0.2 09/19/2009 1400   NITRITE NEGATIVE 09/26/2017 1636   LEUKOCYTESUR TRACE (A) 09/26/2017 1636   Sepsis Labs Invalid input(s): PROCALCITONIN,  WBC,  LACTICIDVEN Microbiology No results found for this or any previous visit (from the past 240 hour(s)).   Time coordinating discharge: 32 minutes  SIGNED:  Chipper Oman, MD  [facility] 10/07/2017, 4:27 PM  Pager please text page via  [email or web address]  Note - This record has been created using [employer]. Chart creation errors have been sought, but may not always have been located. Such creation errors do not reflect on the standard of medical care.

## 2017-10-07 NOTE — Care Management Important Message (Signed)
Important Message  Patient Details  Name: Andre Simmons MRN: [id number] Date of Birth: [birth year]   Medicare Important Message Given:  Yes    Kerin Salen 10/07/2017, 10:33 AMImportant Message  Patient Details  Name: Andre Simmons MRN: [id number] Date of Birth: 04/25/1975   Medicare Important Message Given:  Yes    Kerin Salen 10/07/2017, 10:33 AM

## 2017-10-07 NOTE — Progress Notes (Signed)
Patient upset that po dilaudid was replaced with oxycodone q 12 hrs and IV dilaudid was changed to q 4 hrs. Dr. Quincy Simmonds spoke to patient this morning and made him aware of the plan. Explained to patient that I can only give was it ordered. Suggested that the patient take the oxycodone and see if he gets any relief. Patient states that it does nothing for him but agreed to take dose.

## 2017-10-08 MED ORDER — HYDROMORPHONE HCL 2 MG PO TABS
2.0000 mg | ORAL_TABLET | Freq: Four times a day (QID) | ORAL | Status: DC | PRN
  Administered 2017-10-08 – 2017-10-09 (×5): 2 mg via ORAL
  Filled 2017-10-08 (×5): qty 1

## 2017-10-08 MED ORDER — HYDROMORPHONE HCL 2 MG PO TABS: 2.0000 mg | ORAL_TABLET | Freq: Four times a day (QID) | ORAL | Status: DC | PRN

## 2017-10-08 MED ORDER — FAMOTIDINE 20 MG PO TABS
20.0000 mg | ORAL_TABLET | Freq: Two times a day (BID) | ORAL | Status: DC
  Administered 2017-10-08 – 2017-10-09 (×2): 20 mg via ORAL
  Filled 2017-10-08 (×2): qty 1

## 2017-10-08 MED ORDER — ZOLPIDEM TARTRATE 5 MG PO TABS
5.0000 mg | ORAL_TABLET | Freq: Once | ORAL | Status: AC
  Administered 2017-10-09: 5 mg via ORAL
  Filled 2017-10-08: qty 1

## 2017-10-08 MED ORDER — LORAZEPAM 1 MG PO TABS: 1.0000 mg | ORAL_TABLET | Freq: Every evening | ORAL | Status: DC | PRN

## 2017-10-08 NOTE — Progress Notes (Signed)
The patient is receiving Famotidine by the intravenous route.  Based on criteria approved by the Pharmacy and [place], the medication is being converted to the equivalent oral dose form.  These criteria include: -No active GI bleeding -Able to tolerate diet of full liquids (or better) or tube feeding -Able to tolerate other medications by the oral or enteral route  If you have any questions about this conversion, please contact the Pharmacy Department (phone [phone]).  Thank you.  Minda Ditto PharmD Pager [phone] 10/08/2017, 1:22 PM

## 2017-10-08 NOTE — Progress Notes (Signed)
PROGRESS NOTE [facility]   Andre Simmons   [id number]:[id number] DOB: [birth year]  DOA: 09/26/2017 PCP: Maryella Shivers, MD   Brief Narrative:  Andre Simmons is a 43 year old male with past medical history significant for GERD, chronic back pain and known 3.5 cm pancreatic mass presented to the emergency department on 8/2 complaining of 1 day history of abdominal pain.  Patient prior to admission had EUS and FNA of pancreatic mass by Dr. Ardis Hughs.  In the ED CT abdomen and pelvis was consistent with acute pancreatitis with elevated lipase.  Patient was admitted with working diagnosis of acute pancreatitis and treated conservatively with IVF, pain control and bowel rest.  During hospital stay patient initially had mild improvement however after resuming diet became worse.  GI was consulted and CT of the abdomen was repeated on 8/6 showing worsening pancreatitis.  Subsequently patient was placed n.p.o. again and treated with pain medication.  Patient has been n.p.o. for 3 to 4 days, lipase levels were normal and pain persisted.  GI felt that pain was related to pancreatic mass and recommended to advance diet and continue pain control as needed.   Subjective: Patient seen and examined, patient appealed discharge after he wanted to go home. Overnight was on pain. Today doing well with Oxycontin and dilaudid for breakthrough pain. No nausea or vomiting.  Long discussion with patient about goals of pain control. Patient report a miss understanding by the nurse last night as he claims that he did not wanted to be discharge. Explained at length how pain is going to be manage.   Assessment & Plan: Acute pancreatitis due to EUS/FNA on 8/1 Pancreatitis episode seems to be resolved, with lipase 37. GI was consulted and recommended conservative treatment and has signed off.  Patient clinically improving but complaining of abdominal pain which was felt to be related to pancreatic mass. Currently tolerating  diet well. Pain well controlled with dilaudid 2 mg q6 PRN and oxycontin 10 mg q 12 hrs. Will monitor overnight of pain well controlled ok to d/c in AM.    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    Chronic back pain See pain regimen above    GERD Placed on Pepcid   DVT prophylaxis: SCD's  Code Status: Full Code  Family Communication: None at bedside  Disposition Plan:    Consultants:   GI   Procedures:   None   Antimicrobials:  None   Objective: Vitals:   10/07/17 1410 10/07/17 2017 10/08/17 0415 10/08/17 1455  BP: 109/74 (!) 142/97 119/86 (!) 121/104  Pulse: 82 76 61 98  Resp: 16 18 16 18   Temp: 98.2 F (36.8 C) 98.1 F (36.7 C) 97.6 F (36.4 C) 98.6 F (37 C)  TempSrc: Oral Oral Oral Oral  SpO2: 94% 97% 98% 98%  Weight:   94.5 kg   Height:        Intake/Output Summary (Last 24 hours) at 10/08/2017 1608 Last data filed at 10/08/2017 1506 Gross per 24 hour  Intake 75 ml  Output 2 ml  Net 73 ml   Filed Weights   10/06/17 0703 10/07/17 0522 10/08/17 0415  Weight: 92.4 kg 96.9 kg 94.5 kg    Examination:  General: Pt is alert, awake, not in acute distress Cardiovascular: RRR, S1/S2 +, no rubs, no gallops Respiratory: CTA bilaterally,  no wheezing, no rhonchi Abdominal: Soft, NT, ND, bowel sounds + Extremities: no edema, no cyanosis  Data Reviewed: I have personally reviewed following labs and imaging studies  CBC: Recent Labs  Lab 10/07/17 0509  WBC 8.1  HGB 14.8  HCT 43.2  MCV 92.7  PLT 962*   Basic Metabolic Panel: Recent Labs  Lab 10/02/17 0352 10/05/17 0451 10/06/17 0523 10/07/17 0509  NA 139 140 141 138  K 3.9 4.5 4.7 3.8  CL 104 104 103 102  CO2 27 28 29 24   GLUCOSE 102* 109* 96 90  BUN 6 10 11 12   CREATININE 0.73 0.98 0.94  0.85  CALCIUM 8.6* 9.2 9.7 8.9   GFR: Estimated Creatinine Clearance: 136.7 mL/min (by C-G formula based on SCr of 0.85 mg/dL). Liver Function Tests: No results for input(s): AST, ALT, ALKPHOS, BILITOT, PROT, ALBUMIN in the last 168 hours. Recent Labs  Lab 10/03/17 1214  LIPASE 37   No results for input(s): AMMONIA in the last 168 hours. Coagulation Profile: No results for input(s): INR, PROTIME in the last 168 hours. Cardiac Enzymes: No results for input(s): CKTOTAL, CKMB, CKMBINDEX, TROPONINI in the last 168 hours. BNP (last 3 results) No results for input(s): PROBNP in the last 8760 hours. HbA1C: No results for input(s): HGBA1C in the last 72 hours. CBG: No results for input(s): GLUCAP in the last 168 hours. Lipid Profile: No results for input(s): CHOL, HDL, LDLCALC, TRIG, CHOLHDL, LDLDIRECT in the last 72 hours. Thyroid Function Tests: No results for input(s): TSH, T4TOTAL, FREET4, T3FREE, THYROIDAB in the last 72 hours. Anemia Panel: No results for input(s): VITAMINB12, FOLATE, FERRITIN, TIBC, IRON, RETICCTPCT in the last 72 hours. Sepsis Labs: No results for input(s): PROCALCITON, LATICACIDVEN in the last 168 hours.  No results found for this or any previous visit (from the past 240 hour(s)).    Radiology Studies: No results found.    Scheduled Meds: . acetaminophen  1,000 mg Oral Q8H  . famotidine  20 mg Oral BID  . feeding supplement  1 Container Oral TID BM  . oxyCODONE  10 mg Oral Q12H  . sertraline  50 mg Oral Daily   Continuous Infusions: . sodium chloride 75 mL/hr at 10/08/17 0935     LOS: 12 days    Time spent: Total of 25 minutes spent with pt, greater than 50% of which was spent in discussion of  treatment, counseling and coordination of care   Chipper Oman, MD Pager: Text Page via [email or web address]   If 7PM-7AM, please contact night-coverage [email or web address] 10/08/2017, 4:08 PM   Note - This record has been created using [employer]. Chart  creation errors have been sought, but may not always have been located. Such creation errors do not reflect on the standard of medical care.

## 2017-10-08 NOTE — Care Management Note (Signed)
Case Management Note  Patient Details  Name: Andre Simmons MRN: [id number] Date of Birth: April 21, 1975   This CM was notified that pt appealled his discharge. Detailed Notice of Discharge signed by pt and pt medical information sent to [facility]. Pt will be notified by Judithann Graves when decision has been made.   Lynnell Catalan, RN 10/08/2017, 12:51 PM  [phone]

## 2017-10-09 MED ORDER — HYDROMORPHONE HCL 2 MG PO TABS: 2.0000 mg | ORAL_TABLET | Freq: Four times a day (QID) | ORAL | 0 refills | Status: AC | PRN

## 2017-10-09 MED ORDER — OXYCODONE HCL ER 10 MG PO T12A: 10.0000 mg | EXTENDED_RELEASE_TABLET | Freq: Two times a day (BID) | ORAL | 0 refills | Status: DC

## 2017-10-09 MED ORDER — FAMOTIDINE 20 MG PO TABS: 20.0000 mg | ORAL_TABLET | Freq: Two times a day (BID) | ORAL | 0 refills | Status: DC

## 2017-10-09 MED ORDER — MORPHINE SULFATE ER 15 MG PO TBCR: 15.0000 mg | EXTENDED_RELEASE_TABLET | Freq: Two times a day (BID) | ORAL | 0 refills | Status: AC

## 2017-10-09 NOTE — Discharge Summary (Addendum)
Physician Discharge Summary  Andre Simmons  VQQ:[id number]  DOB: 1976/01/02  DOA: 09/26/2017 PCP: Maryella Shivers, MD  Admit date: 09/26/2017 Discharge date: 10/09/2017  Admitted From: Home  Disposition:  Home   Recommendations for Outpatient Follow-up:  1. Follow up with PCP in 1 week 2. Follow-up with general surgery on 8/22 to evaluate pancreatic mass  Discharge Condition: Stable  CODE STATUS: Full Code  Diet recommendation: Heart Healthy   Brief/Interim Summary: For full details see H&P/Progress note, but in brief, Andre Simmons is a 42 year old male with past medical  history significant for GERD, chronic back pain and known 3.5 cm pancreatic mass presented to the emergency department on 8/2 complaining of 1 day history of abdominal pain.  Patient prior to admission had EUS and FNA of pancreatic mass by Dr. Ardis Hughs.  In the ED CT abdomen and pelvis was consistent with acute pancreatitis with elevated lipase.  Patient was admitted with working diagnosis of acute pancreatitis and treated conservatively with IVF, pain control and bowel rest.  During hospital stay patient initially had mild improvement however after resuming diet became worse.  GI was consulted and CT of the abdomen was repeated on 8/6 showing worsening pancreatitis.  Subsequently patient was placed n.p.o. again and treated with pain medication.  Patient has been n.p.o. for 3 to 4 days, lipase levels were normal and pain persisted.  GI felt that pain was related to pancreatic mass and recommended to advance diet and continue pain control as needed.  Subjective: Patient seen and examined, he is doing well with current pain management. Tolerating diet well. Have no new complaints.   Discharge Diagnoses/Hospital Course:  Principal Problem:   Acute pancreatitis Active Problems:   Pancreatic lesion   Chronic back pain  Acute pancreatitis due to EUS/FNA on 8/1 Pancreatitis episode seems to be resolved, with lipase 37.  GI was consulted and recommended conservative treatment and has signed off. Patient clinically improving but complaining of abdominal pain which was felt to be related to pancreatic mass. Currently tolerating diet well. Pain well controlled with dilaudid 2 mg q6 PRN and Ms Contin 15 mg q 12 hrs. Prescription for 5 days given. 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   Chronic back pain See pain regimen above   GERD Placed on Pepcid   On the day of the discharge the patient's vitals were stable, and no other acute medical condition were reported by patient. the patient was felt safe to be discharge to home.   Discharge Instructions  You were cared for by a hospitalist during your hospital stay. If you have any questions about your discharge medications or the care you received while you were in the hospital after you are discharged, you can call the unit and asked to speak with the hospitalist on call if the hospitalist that took care of you is not available. Once you are discharged, your primary care physician will handle any further medical issues. Please note that NO REFILLS for any discharge medications will be authorized once you are discharged, as it is imperative that you return to your primary care physician (or establish a relationship with a primary care physician if you do not have one) for your aftercare needs so that they can reassess your need for medications and monitor your lab  values.  Discharge Instructions    Call MD for:  difficulty breathing, headache or visual disturbances   Complete by:  As directed    Call MD for:  extreme fatigue   Complete by:  As directed    Call MD for:  hives   Complete by:  As directed    Call MD for:  persistant  dizziness or light-headedness   Complete by:  As directed    Call MD for:  persistant nausea and vomiting   Complete by:  As directed    Call MD for:  redness, tenderness, or signs of infection (pain, swelling, redness, odor or green/yellow discharge around incision site)   Complete by:  As directed    Call MD for:  severe uncontrolled pain   Complete by:  As directed    Call MD for:  temperature >100.4   Complete by:  As directed    Diet - low sodium heart healthy   Complete by:  As directed    Increase activity slowly   Complete by:  As directed      Allergies as of 10/09/2017   No Known Allergies     Medication List    STOP taking these medications   ondansetron 8 MG disintegrating tablet Commonly known as:  ZOFRAN-ODT   oxyCODONE-acetaminophen 5-325 MG tablet Commonly known as:  PERCOCET/ROXICET     TAKE these medications   bisacodyl 5 MG EC tablet Commonly known as:  DULCOLAX Take 1 tablet (5 mg total) by mouth daily as needed for moderate constipation.   famotidine 20 MG tablet Commonly known as:  PEPCID Take 1 tablet (20 mg total) by mouth 2 (two) times daily.   HYDROmorphone 2 MG tablet Commonly known as:  DILAUDID Take 1 tablet (2 mg total) by mouth every 6 (six) hours as needed for up to 5 days for moderate pain or severe pain.   morphine 15 MG 12 hr tablet Commonly known as:  MS CONTIN Take 1 tablet (15 mg total) by mouth every 12 (twelve) hours for 5 days.   omeprazole 40 MG capsule Commonly known as:  PRILOSEC Take 40 mg by mouth 2 (two) times daily.   promethazine 12.5 MG tablet Commonly known as:  PHENERGAN Take 1 tablet (12.5 mg total) by mouth every 8 (eight) hours as needed for nausea or vomiting.   sertraline 50 MG tablet Commonly known as:  ZOLOFT Take 50 mg by mouth daily.      Follow-up Information    Stark Klein, MD. Go on 10/16/2017.   Specialty:  General Surgery Why:  Please arrive at address above to see Dr. Barry Dienes at 1:30 for  your 2:00 appointment. Thank you. Contact information: [address] [place] [address] [phone]        Maryella Shivers, MD. Schedule an appointment as soon as possible for a visit in 1 week(s).   Specialty:  Family Medicine Why:  Hospital follow up  Contact information: [address] [place] [address] [phone]          No Known Allergies  Consultations:  GI   Procedures/Studies: Ct Abdomen Pelvis W Contrast  Result Date: 09/30/2017 CLINICAL DATA:  42 year old male with history of pancreatitis. Left upper quadrant abdominal pain. EXAM: CT ABDOMEN AND PELVIS WITH CONTRAST TECHNIQUE: Multidetector CT imaging of the abdomen and pelvis was performed using the standard protocol following bolus administration of intravenous contrast. CONTRAST:  64mL ISOVUE-300 IOPAMIDOL (ISOVUE-300) INJECTION 61%,  18mL OMNIPAQUE IOHEXOL 300 MG/ML SOLN COMPARISON:  CT the abdomen and pelvis 09/26/2017. FINDINGS: Lower chest: Increasing areas of subsegmental atelectasis in the left lower lobe. Hepatobiliary: No suspicious cystic or solid hepatic lesions. No intra or extrahepatic biliary ductal dilatation. Gallbladder is normal in appearance. Pancreas: Increasing inflammatory changes adjacent to the distal body and tail of the pancreas, indicative of acute pancreatitis. Head and proximal body of the pancreas are generally normal in appearance. Previously described soft tissue mass with internal calcifications in the tail of the pancreas is again noted, but difficult to discretely measure given the surrounding inflammatory changes. Spleen: Unremarkable. Adrenals/Urinary Tract: Bilateral kidneys and adrenal glands are normal in appearance. No hydroureteronephrosis. Urinary bladder is normal. Bilateral adrenal glands are normal in appearance. Stomach/Bowel: Normal appearance of the stomach. No pathologic dilatation of small bowel or colon. Normal appendix.  Vascular/Lymphatic: Aortic atherosclerosis. No aneurysm identified in the visualized abdominal vasculature. No lymphadenopathy noted in the abdomen. Other: Inflammatory changes adjacent to the distal body and tail of the pancreas with a trace amount of fluid tracking in the left pericolic gutter. No larger volume of ascites. No pneumoperitoneum. Reproductive: Prostate gland and seminal vesicles are unremarkable in appearance. Musculoskeletal: There are no aggressive appearing lytic or blastic lesions noted in the visualized portions of the skeleton. IMPRESSION: 1. Worsening inflammatory changes adjacent to the distal body and tail of the pancreas, compatible with progressive pancreatitis. No pancreatic pseudocyst or other complicating features noted at this time. Previously demonstrated partially calcified soft tissue mass in the tail of the pancreas is partially obscured on today's examination, but attention on future follow-up studies is recommended. 2. Aortic atherosclerosis. Electronically Signed   By: Vinnie Langton M.D.   On: 09/30/2017 20:28   Ct Abdomen Pelvis W Contrast  Result Date: 09/26/2017 CLINICAL DATA:  LEFT UPPER QUADRANT abdominal pain radiating into the back, 1 day post endoscopic ultrasound biopsy of a mass involving the pancreatic tail. EXAM: CT ABDOMEN AND PELVIS WITH CONTRAST TECHNIQUE: Multidetector CT imaging of the abdomen and pelvis was performed using the standard protocol following bolus administration of intravenous contrast. CONTRAST:  173mL ISOVUE-300 IOPAMIDOL INJECTION 61% IV. COMPARISON:  MRI abdomen 09/11/2017. CT abdomen 09/05/2017, 01/29/2016. FINDINGS: Lower chest: Heart size normal.  Visualized lung bases clear. Hepatobiliary: Liver normal in size and appearance. Gallbladder normal in appearance without calcified gallstones. No biliary ductal dilation. Pancreas: Mild edema/inflammation surrounding the body and tail of the pancreas, new since the most recent prior MRI and  CT. The calcified heterogeneous soft tissue mass involving the pancreatic tail is unchanged, measuring approximately 3.8 x 3.2 cm (series 2, image 36). Remainder of the pancreas remains normal in appearance. Spleen: Normal in size and appearance. Adrenals/Urinary Tract: Normal appearing adrenal glands. Kidneys normal in size and appearance without focal parenchymal abnormality. No evidence of urinary tract calculi or obstruction. Normal-appearing urinary bladder. Stomach/Bowel: Stomach normal in appearance for the degree of distention. Normal-appearing small bowel. Scattered sigmoid colon diverticula without evidence of acute diverticulitis. Remainder of the colon normal in appearance. Normal caliber appendix containing a small appendicolith, without evidence of periappendiceal inflammation. Vascular/Lymphatic: Aortoiliac atherosclerosis, advanced for patient age, without evidence of aneurysm. Normal-appearing portal venous and systemic venous systems. No pathologic lymphadenopathy. Reproductive: Prostate gland and seminal vesicles normal in size and appearance for age. Other: None. Musculoskeletal: Degenerative disc disease, spondylosis and facet degenerative changes at L5-S1. No acute findings. IMPRESSION: 1. Acute pancreatitis involving the body and tail of the pancreas. 2. Stable calcified mass involving  the pancreatic tail. 3. No evidence of metastatic disease. 4.  Aortic Atherosclerosis, advanced for patient age.  (ICD10-170.0) Electronically Signed   By: Evangeline Dakin M.D.   On: 09/26/2017 17:07    Discharge Exam: Vitals:   10/08/17 2030 10/09/17 0652  BP: (!) 120/92 121/87  Pulse: 77 (!) 50  Resp: 16 20  Temp: 98.3 F (36.8 C) 97.8 F (36.6 C)  SpO2: 99% 99%   Vitals:   10/08/17 0415 10/08/17 1455 10/08/17 2030 10/09/17 0652  BP: 119/86 (!) 121/104 (!) 120/92 121/87  Pulse: 61 98 77 (!) 50  Resp: 16 18 16 20   Temp: 97.6 F (36.4 C) 98.6 F (37 C) 98.3 F (36.8 C) 97.8 F (36.6 C)   TempSrc: Oral Oral Oral Oral  SpO2: 98% 98% 99% 99%  Weight: 94.5 kg   92.1 kg  Height:       General: NAD  Cardiovascular: RRR, S1/S2 + Respiratory: CTA bilaterally Abdominal: Soft, mild epigastric tenderness  Extremities: no edema   The results of significant diagnostics from this hospitalization (including imaging, microbiology, ancillary and laboratory) are listed below for reference.     Microbiology: No results found for this or any previous visit (from the past 240 hour(s)).   Labs: BNP (last 3 results) No results for input(s): BNP in the last 8760 hours. Basic Metabolic Panel: Recent Labs  Lab 10/05/17 0451 10/06/17 0523 10/07/17 0509  NA 140 141 138  K 4.5 4.7 3.8  CL 104 103 102  CO2 28 29 24   GLUCOSE 109* 96 90  BUN 10 11 12   CREATININE 0.98 0.94 0.85  CALCIUM 9.2 9.7 8.9   Liver Function Tests: No results for input(s): AST, ALT, ALKPHOS, BILITOT, PROT, ALBUMIN in the last 168 hours. Recent Labs  Lab 10/03/17 1214  LIPASE 37   No results for input(s): AMMONIA in the last 168 hours. CBC: Recent Labs  Lab 10/07/17 0509  WBC 8.1  HGB 14.8  HCT 43.2  MCV 92.7  PLT 405*   Cardiac Enzymes: No results for input(s): CKTOTAL, CKMB, CKMBINDEX, TROPONINI in the last 168 hours. BNP: Invalid input(s): POCBNP CBG: No results for input(s): GLUCAP in the last 168 hours. D-Dimer No results for input(s): DDIMER in the last 72 hours. Hgb A1c No results for input(s): HGBA1C in the last 72 hours. Lipid Profile No results for input(s): CHOL, HDL, LDLCALC, TRIG, CHOLHDL, LDLDIRECT in the last 72 hours. Thyroid function studies No results for input(s): TSH, T4TOTAL, T3FREE, THYROIDAB in the last 72 hours.  Invalid input(s): FREET3 Anemia work up No results for input(s): VITAMINB12, FOLATE, FERRITIN, TIBC, IRON, RETICCTPCT in the last 72 hours. Urinalysis    Component Value Date/Time   COLORURINE STRAW (A) 09/26/2017 1636   APPEARANCEUR CLEAR 09/26/2017  1636   LABSPEC 1.009 09/26/2017 1636   PHURINE 8.0 09/26/2017 1636   GLUCOSEU NEGATIVE 09/26/2017 1636   HGBUR NEGATIVE 09/26/2017 1636   BILIRUBINUR NEGATIVE 09/26/2017 1636   KETONESUR NEGATIVE 09/26/2017 1636   PROTEINUR NEGATIVE 09/26/2017 1636   UROBILINOGEN 0.2 09/19/2009 1400   NITRITE NEGATIVE 09/26/2017 1636   LEUKOCYTESUR TRACE (A) 09/26/2017 1636   Sepsis Labs Invalid input(s): PROCALCITONIN,  WBC,  LACTICIDVEN Microbiology No results found for this or any previous visit (from the past 240 hour(s)).  Time coordinating discharge: 35 minutes  SIGNED:  Chipper Oman, MD  [facility] 10/09/2017, 2:14 PM  Pager please text page via  [email or web address]  Note - This record has been created using [employer]  software. Chart creation errors have been sought, but may not always have been located. Such creation errors do not reflect on the standard of medical care.

## 2017-10-12 ENCOUNTER — Other Ambulatory Visit: Payer: Self-pay | Admitting: General Surgery

## 2017-10-14 ENCOUNTER — Encounter: Payer: Self-pay | Admitting: General Surgery

## 2017-10-14 DIAGNOSIS — K8689 Other specified diseases of pancreas: Secondary | ICD-10-CM | POA: Diagnosis not present

## 2017-10-14 DIAGNOSIS — K859 Acute pancreatitis without necrosis or infection, unspecified: Secondary | ICD-10-CM | POA: Diagnosis not present

## 2017-10-16 ENCOUNTER — Telehealth: Payer: Self-pay | Admitting: Gastroenterology

## 2017-10-16 ENCOUNTER — Ambulatory Visit: Payer: Medicare Other | Admitting: Gastroenterology

## 2017-10-16 NOTE — Telephone Encounter (Signed)
He now showed his appointment with Dr Lyndel Safe this morning because he was "hung over" from his pain medications, oxycontin and dilaudid prescribed by Dr Barry Dienes, his surgeon.  He is scheduled for surgery in October and then will reschedule an appointment with Dr Lyndel Safe.

## 2017-10-23 ENCOUNTER — Other Ambulatory Visit: Payer: Self-pay | Admitting: General Surgery

## 2017-10-23 DIAGNOSIS — K859 Acute pancreatitis without necrosis or infection, unspecified: Secondary | ICD-10-CM

## 2017-11-03 ENCOUNTER — Ambulatory Visit
Admission: RE | Admit: 2017-11-03 | Discharge: 2017-11-03 | Disposition: A | Discharge status: Home / Self Care | Payer: Medicare Other | Source: Ambulatory Visit | Attending: General Surgery | Admitting: General Surgery

## 2017-11-03 DIAGNOSIS — K859 Acute pancreatitis without necrosis or infection, unspecified: Secondary | ICD-10-CM

## 2017-11-03 DIAGNOSIS — K869 Disease of pancreas, unspecified: Secondary | ICD-10-CM | POA: Diagnosis not present

## 2017-11-03 MED ORDER — IOPAMIDOL (ISOVUE-300) INJECTION 61%
100.0000 mL | Freq: Once | INTRAVENOUS | Status: AC | PRN
  Administered 2017-11-03: 100 mL via INTRAVENOUS

## 2017-11-03 NOTE — Progress Notes (Signed)
Pancreatitis has resolved.  [place] for surgery. Please make sure he has pre op appt.

## 2017-11-14 NOTE — Pre-Procedure Instructions (Signed)
Andre Simmons  11/14/2017      Andre Simmons, [place] [address] Phone: [phone] Fax: [phone]  [id number] - [place], [name] [place] [address] Phone: [phone] Fax: [phone]    Your procedure is scheduled on Tuesday, October 1st at 9:00 am  Report to [facility] at 700 A.M.  Call this number if you have problems the morning of surgery:  [phone]   Remember:  Do not eat or drink after midnight.      Take these medicines the morning of surgery with A SIP OF WATER: omeprazole (Prilosec)  7 days prior to surgery STOP taking any Aspirin, Aleve, Naproxen, Ibuprofen, Motrin, Advil, Goody's, BC's, all herbal medications, fish oil, supplements and all vitamins.     Do not wear jewelry, make-up or nail polish.  Do not wear lotions, powders, or perfumes, or deodorant.  Do not shave 48 hours prior to surgery.  Men may shave face and neck.  Do not bring valuables to the hospital.  [facility] is not responsible for any belongings or valuables.  Contacts, dentures or bridgework may not be worn into surgery.  Leave your suitcase in the car.  After surgery it may be brought to your room. [place]- Preparing For Surgery Before surgery, you can play an important role. Because skin is not sterile, your skin needs to be as free of germs as possible. You can reduce the number of germs on your skin by washing with CHG (chlorahexidine gluconate) Soap before surgery. CHG is an antiseptic cleaner which kills germs and bonds with the skin to continue killing germs even after washing. Please do not use if you have an allergy to CHG or antibacterial soaps. If your skin becomes reddened/irritated stop using the CHG.  Do not shave (including legs and underarms) for at least 48 hours prior to first CHG shower. It is  OK to shave your face. Please follow these instructions carefully. 1. Shower the NIGHT BEFORE SURGERY and the MORNING OF SURGERY with CHG.  2. If you chose to wash your hair, wash your hair first as usual with your normal shampoo.  3. After you shampoo, rinse your hair and body thoroughly to remove the shampoo.  4. Use CHG as you would any other liquid soap. You can apply CHG directly to the skin and wash gently with a scrungie or a clean washcloth.  5. Apply the CHG Soap to your body ONLY FROM THE NECK DOWN. Do not use on open wounds or open sores. Avoid contact with your eyes, ears, mouth and genitals (private parts). Wash genitals (private parts) with your normal soap.  6. Wash thoroughly, paying special attention to the area where your surgery will be performed.  7. Thoroughly rinse your body with warm water from the neck down.  8. DO NOT shower/wash with your normal soap after using and rinsing off the CHG Soap.  9. Pat yourself dry with a CLEAN TOWEL.  10. Wear CLEAN PAJAMAS  11. Place CLEAN SHEETS on your bed the night of your first shower and DO NOT SLEEP WITH PETS.  ? ? Day of Surgery: Do not apply any deodorants/lotions. Please wear clean clothes to the hospital/surgery center.     For patients admitted to the hospital, discharge time will be determined by your treatment team.  Patients discharged the day of surgery will not be allowed to drive home.    Please read over the following fact sheets that you were given.

## 2017-11-17 ENCOUNTER — Inpatient Hospital Stay (HOSPITAL_COMMUNITY)
Admission: RE | Admit: 2017-11-17 | Discharge: 2017-11-17 | Disposition: A | Discharge status: Home / Self Care | Payer: Medicare Other | Source: Ambulatory Visit

## 2017-11-17 NOTE — Pre-Procedure Instructions (Signed)
Andre Simmons  11/17/2017      [place], [address] [place] [address] Phone: [phone] Fax: [phone]  [id number] - [place], [place] [address]  [address] Phone: [phone] Fax: [phone]    Your procedure is scheduled on Tuesday, Oct. 1st   Report to [facility] Admitting at 7:00 AM             (posted surgery time 9a- 12n)   Call this number if you have problems the morning of surgery:  [phone]   Remember:   Do not eat any foods after midnight, Monday.   You may drink clear liquids until 6:00 am .  Clear liquids allowed are:   Water, Juice (non-citric and without pulp), Black Coffee only, Plain Jell-O only and Gatorade    Take these medicines the morning of surgery with A SIP OF WATER : Omprezole    Do not wear jewelry - no rings or watches.  Do not wear lotions, colognes or deodorant.  Do not shave 48 hours prior to surgery.   Do not bring valuables to the hospital.  [facility] is not responsible for any belongings or valuables.  Contacts, dentures or bridgework may not be worn into surgery.  Leave your suitcase in the car.  After surgery it may be brought to your room.  For patients admitted to the hospital, discharge time will be determined by your treatment team.  Please read over the following fact sheets that you were given. Pain Booklet and Surgical Site Infection Prevention        [place]- Preparing For Surgery  Before surgery, you can play an important role. Because skin is not sterile, your skin needs to be as free of germs as possible. You can reduce the number of germs on your skin by washing with CHG (chlorahexidine gluconate) Soap before surgery.  CHG is an antiseptic cleaner which kills germs and bonds with the skin to continue killing germs even after washing.    Oral Hygiene  is also important to reduce your risk of infection.    Remember - BRUSH YOUR TEETH THE MORNING OF SURGERY WITH YOUR REGULAR TOOTHPASTE  Please do not use if you have an allergy to CHG or antibacterial soaps. If your skin becomes reddened/irritated stop using the CHG.  Do not shave (including legs and underarms) for at least 48 hours prior to first CHG shower. It is OK to shave your face.  Please follow these instructions carefully.   1. Shower the NIGHT BEFORE SURGERY and the MORNING OF SURGERY with CHG.   2. If you chose to wash your hair, wash your hair first as usual with your normal shampoo.  3. After you shampoo, rinse your hair and body thoroughly to remove the shampoo.  4. Use CHG as you would any other liquid soap. You can apply CHG directly to the skin and wash gently with a scrungie or a clean washcloth.   5. Apply the CHG Soap to your body ONLY FROM THE NECK DOWN.  Do not use on open wounds or open sores. Avoid contact with your eyes, ears, mouth and genitals (private parts). Wash Face and genitals (private parts)  with your normal soap.  6. Wash thoroughly, paying special attention to the area where your surgery will be performed.  7. Thoroughly rinse  your body with warm water from the neck down.  8. DO NOT shower/wash with your normal soap after using and rinsing off the CHG Soap.  9. Pat yourself dry with a CLEAN TOWEL.  10. Wear CLEAN PAJAMAS to bed the night before surgery, wear comfortable clothes the morning of surgery  11. Place CLEAN SHEETS on your bed the night of your first shower and DO NOT SLEEP WITH PETS.    Day of Surgery:  Do not apply any deodorants/lotions.  Please wear clean clothes to the hospital/surgery center.    Remember to brush your teeth WITH YOUR REGULAR TOOTHPASTE.

## 2017-11-21 ENCOUNTER — Encounter (HOSPITAL_COMMUNITY)
Admission: RE | Admit: 2017-11-21 | Discharge: 2017-11-21 | Disposition: A | Discharge status: Home / Self Care | Payer: Medicare Other | Source: Ambulatory Visit | Attending: General Surgery | Admitting: General Surgery

## 2017-11-21 ENCOUNTER — Encounter (HOSPITAL_COMMUNITY): Payer: Self-pay

## 2017-11-21 ENCOUNTER — Other Ambulatory Visit: Payer: Self-pay

## 2017-11-21 DIAGNOSIS — Z01812 Encounter for preprocedural laboratory examination: Secondary | ICD-10-CM | POA: Insufficient documentation

## 2017-11-21 HISTORY — DX: Major depressive disorder, single episode, unspecified: F32.9

## 2017-11-21 HISTORY — DX: Unspecified osteoarthritis, unspecified site: M19.90

## 2017-11-21 HISTORY — DX: Depression, unspecified: F32.A

## 2017-11-21 LAB — PROTIME-INR
INR: 0.92
PROTHROMBIN TIME: 12.3 s (ref 11.4–15.2)

## 2017-11-21 LAB — COMPREHENSIVE METABOLIC PANEL
ALK PHOS: 75 U/L (ref 38–126)
ALT: 34 U/L (ref 0–44)
ANION GAP: 12 (ref 5–15)
AST: 23 U/L (ref 15–41)
Albumin: 4.3 g/dL (ref 3.5–5.0)
BILIRUBIN TOTAL: 0.5 mg/dL (ref 0.3–1.2)
BUN: 11 mg/dL (ref 6–20)
CALCIUM: 9.4 mg/dL (ref 8.9–10.3)
CO2: 21 mmol/L — ABNORMAL LOW (ref 22–32)
CREATININE: 0.96 mg/dL (ref 0.61–1.24)
Chloride: 105 mmol/L (ref 98–111)
Glucose, Bld: 139 mg/dL — ABNORMAL HIGH (ref 70–99)
Potassium: 3.8 mmol/L (ref 3.5–5.1)
Sodium: 138 mmol/L (ref 135–145)
TOTAL PROTEIN: 7.8 g/dL (ref 6.5–8.1)

## 2017-11-21 LAB — CBC
HCT: 45.6 % (ref 39.0–52.0)
HEMOGLOBIN: 14.9 g/dL (ref 13.0–17.0)
MCH: 31.4 pg (ref 26.0–34.0)
MCHC: 32.7 g/dL (ref 30.0–36.0)
MCV: 96.2 fL (ref 78.0–100.0)
Platelets: 314 10*3/uL (ref 150–400)
RBC: 4.74 MIL/uL (ref 4.22–5.81)
RDW: 13.1 % (ref 11.5–15.5)
WBC: 10 10*3/uL (ref 4.0–10.5)

## 2017-11-21 LAB — TYPE AND SCREEN
ABO/RH(D): A POS
Antibody Screen: NEGATIVE

## 2017-11-21 LAB — URINALYSIS, ROUTINE W REFLEX MICROSCOPIC
Bilirubin Urine: NEGATIVE
GLUCOSE, UA: NEGATIVE mg/dL
Hgb urine dipstick: NEGATIVE
KETONES UR: NEGATIVE mg/dL
LEUKOCYTES UA: NEGATIVE
Nitrite: NEGATIVE
PH: 5 (ref 5.0–8.0)
PROTEIN: NEGATIVE mg/dL
Specific Gravity, Urine: 1.026 (ref 1.005–1.030)

## 2017-11-21 LAB — ABO/RH: ABO/RH(D): A POS

## 2017-11-21 LAB — LIPASE, BLOOD: Lipase: 38 U/L (ref 11–51)

## 2017-11-21 NOTE — Progress Notes (Signed)
Pt denies cardiac history, HTN or Diabetes.  EKG done on 09/27/17

## 2017-11-21 NOTE — Pre-Procedure Instructions (Signed)
Andre Simmons  11/21/2017    Your procedure is scheduled on Tuesday, November 25, 2017 at 9:00 AM.   Report to [facility] Entrance "A" Admitting Office at 7:00 AM.   Call this number if you have problems the morning of surgery: [phone]   Questions prior to day of surgery, please call [phone] between 8 & 4 PM.   Remember:  Do not eat food after midnight Monday.   You may drink clear liquids until 6:00 AM. Clear liquids allowed are: Water, Juice (no pulp), Sodas, Clear tea, Black coffee, plain Jello, plain Popsicles, Gatorade                      Take these medicines the morning of surgery with A SIP OF WATER: Omeprazole (Prilosec)    Do not wear jewelry.  Do not wear lotions, powders, cologne or deodorant.  Men may shave face and neck.  Do not bring valuables to the hospital.  [facility] is not responsible for any belongings or valuables.  Contacts, dentures or bridgework may not be worn into surgery.  Leave your suitcase in the car.  After surgery it may be brought to your room.  For patients admitted to the hospital, discharge time will be determined by your treatment team.  [facility] - Preparing for Surgery  Before surgery, you can play an important role.  Because skin is not sterile, your skin needs to be as free of germs as possible.  You can reduce the number of germs on you skin by washing with CHG (chlorahexidine gluconate) soap before surgery.  CHG is an antiseptic cleaner which kills germs and bonds with the skin to continue killing germs even after washing.  Oral Hygiene is also important in reducing the risk of infection.  Remember to brush your teeth with your regular toothpaste the morning of surgery.  Please DO NOT use if you have an allergy to CHG or antibacterial soaps.  If your skin becomes reddened/irritated stop using the CHG and inform your nurse when you arrive at Short Stay.  Do not shave (including legs and underarms) for at least 48  hours prior to the first CHG shower.  You may shave your face.  Please follow these instructions carefully:   1.  Shower with CHG Soap the night before surgery and the morning of Surgery.  2.  If you choose to wash your hair, wash your hair first as usual with your normal shampoo.  3.  After you shampoo, rinse your hair and body thoroughly to remove the shampoo. 4.  Use CHG as you would any other liquid soap.  You can apply chg directly to the skin and wash gently with a      scrungie or washcloth.           5.  Apply the CHG Soap to your body ONLY FROM THE NECK DOWN.   Do not use on open wounds or open sores. Avoid contact with your eyes, ears, mouth and genitals (private parts).  Wash genitals (private parts) with your normal soap.  6.  Wash thoroughly, paying special attention to the area where your surgery will be performed.  7.  Thoroughly rinse your body with warm water from the neck down.  8.  DO NOT shower/wash with your normal soap after using and rinsing off the CHG Soap.  9.  Pat yourself dry with a clean towel.  10.  Wear clean pajamas.            11.  Place clean sheets on your bed the night of your first shower and do not sleep with pets.  Day of Surgery  Shower as above. Do not apply any lotions/deodorants the morning of surgery.   Please wear clean clothes to the hospital. Remember to brush your teeth with toothpaste.   Please read over the fact sheets that you were given.

## 2017-11-25 ENCOUNTER — Inpatient Hospital Stay (HOSPITAL_COMMUNITY)
Admission: RE | Admit: 2017-11-25 | Discharge: 2017-12-02 | DRG: 828 | Disposition: A | Discharge status: Home / Self Care | Payer: Medicare Other | Source: Ambulatory Visit | Attending: General Surgery | Admitting: General Surgery

## 2017-11-25 ENCOUNTER — Encounter (HOSPITAL_COMMUNITY): Payer: Self-pay | Admitting: *Deleted

## 2017-11-25 ENCOUNTER — Inpatient Hospital Stay (HOSPITAL_COMMUNITY): Payer: Medicare Other | Admitting: Anesthesiology

## 2017-11-25 ENCOUNTER — Encounter (HOSPITAL_COMMUNITY)
Admission: RE | Disposition: A | Discharge status: Home / Self Care | Payer: Self-pay | Source: Ambulatory Visit | Attending: General Surgery

## 2017-11-25 ENCOUNTER — Other Ambulatory Visit: Payer: Self-pay

## 2017-11-25 DIAGNOSIS — Z8261 Family history of arthritis: Secondary | ICD-10-CM | POA: Diagnosis not present

## 2017-11-25 DIAGNOSIS — Z23 Encounter for immunization: Secondary | ICD-10-CM | POA: Diagnosis not present

## 2017-11-25 DIAGNOSIS — K219 Gastro-esophageal reflux disease without esophagitis: Secondary | ICD-10-CM | POA: Diagnosis present

## 2017-11-25 DIAGNOSIS — K8689 Other specified diseases of pancreas: Secondary | ICD-10-CM | POA: Diagnosis not present

## 2017-11-25 DIAGNOSIS — R066 Hiccough: Secondary | ICD-10-CM | POA: Diagnosis not present

## 2017-11-25 DIAGNOSIS — F329 Major depressive disorder, single episode, unspecified: Secondary | ICD-10-CM | POA: Diagnosis present

## 2017-11-25 DIAGNOSIS — C254 Malignant neoplasm of endocrine pancreas: Secondary | ICD-10-CM | POA: Diagnosis not present

## 2017-11-25 DIAGNOSIS — R339 Retention of urine, unspecified: Secondary | ICD-10-CM | POA: Diagnosis not present

## 2017-11-25 DIAGNOSIS — I700 Atherosclerosis of aorta: Secondary | ICD-10-CM | POA: Diagnosis present

## 2017-11-25 DIAGNOSIS — D490 Neoplasm of unspecified behavior of digestive system: Secondary | ICD-10-CM | POA: Diagnosis not present

## 2017-11-25 DIAGNOSIS — D3A8 Other benign neuroendocrine tumors: Secondary | ICD-10-CM | POA: Diagnosis present

## 2017-11-25 HISTORY — DX: Neoplasm of unspecified behavior of digestive system: D49.0

## 2017-11-25 HISTORY — PX: PANCREATECTOMY: SHX5261

## 2017-11-25 LAB — CBC
HEMATOCRIT: 40.7 % (ref 39.0–52.0)
Hemoglobin: 13.6 g/dL (ref 13.0–17.0)
MCH: 31.6 pg (ref 26.0–34.0)
MCHC: 33.4 g/dL (ref 30.0–36.0)
MCV: 94.7 fL (ref 78.0–100.0)
PLATELETS: 357 10*3/uL (ref 150–400)
RBC: 4.3 MIL/uL (ref 4.22–5.81)
RDW: 13.2 % (ref 11.5–15.5)
WBC: 24.9 10*3/uL — ABNORMAL HIGH (ref 4.0–10.5)

## 2017-11-25 LAB — CREATININE, SERUM
CREATININE: 1.11 mg/dL (ref 0.61–1.24)
GFR calc Af Amer: >60 mL/min (ref >60)

## 2017-11-25 SURGERY — PANCREATECTOMY, LAPAROSCOPIC
Anesthesia: General | Site: Abdomen

## 2017-11-25 MED ORDER — KETOROLAC TROMETHAMINE 30 MG/ML IJ SOLN
30.0000 mg | Freq: Four times a day (QID) | INTRAMUSCULAR | Status: DC | PRN
  Filled 2017-11-25: qty 1

## 2017-11-25 MED ORDER — ONDANSETRON HCL 4 MG/2ML IJ SOLN: 4.0000 mg | Freq: Four times a day (QID) | INTRAMUSCULAR | Status: DC | PRN

## 2017-11-25 MED ORDER — SERTRALINE HCL 50 MG PO TABS
50.0000 mg | ORAL_TABLET | Freq: Every day | ORAL | Status: DC
  Administered 2017-11-25 – 2017-12-01 (×7): 50 mg via ORAL
  Filled 2017-11-25 (×7): qty 1

## 2017-11-25 MED ORDER — GABAPENTIN 300 MG PO CAPS
300.0000 mg | ORAL_CAPSULE | Freq: Two times a day (BID) | ORAL | Status: DC
  Administered 2017-11-25 – 2017-12-02 (×15): 300 mg via ORAL
  Filled 2017-11-25 (×3): qty 1
  Filled 2017-11-25: qty 3
  Filled 2017-11-25 (×11): qty 1

## 2017-11-25 MED ORDER — SODIUM CHLORIDE 0.9 % IR SOLN
Status: DC | PRN
  Administered 2017-11-25: 1000 mL

## 2017-11-25 MED ORDER — ONDANSETRON HCL 4 MG/2ML IJ SOLN
INTRAMUSCULAR | Status: DC | PRN
  Administered 2017-11-25: 4 mg via INTRAVENOUS

## 2017-11-25 MED ORDER — SIMETHICONE 80 MG PO CHEW
40.0000 mg | CHEWABLE_TABLET | Freq: Four times a day (QID) | ORAL | Status: DC | PRN
  Administered 2017-11-26 (×2): 40 mg via ORAL
  Filled 2017-11-25 (×2): qty 1

## 2017-11-25 MED ORDER — EVICEL 5 ML EX KIT
PACK | CUTANEOUS | Status: AC
  Filled 2017-11-25: qty 1

## 2017-11-25 MED ORDER — PHENYLEPHRINE 40 MCG/ML (10ML) SYRINGE FOR IV PUSH (FOR BLOOD PRESSURE SUPPORT)
PREFILLED_SYRINGE | INTRAVENOUS | Status: DC | PRN
  Administered 2017-11-25 (×2): 80 ug via INTRAVENOUS
  Administered 2017-11-25: 40 ug via INTRAVENOUS

## 2017-11-25 MED ORDER — HYDROMORPHONE 1 MG/ML IV SOLN
INTRAVENOUS | Status: DC
  Administered 2017-11-25: 25 mg via INTRAVENOUS
  Filled 2017-11-25: qty 25

## 2017-11-25 MED ORDER — FENTANYL CITRATE (PF) 100 MCG/2ML IJ SOLN
INTRAMUSCULAR | Status: AC
  Filled 2017-11-25: qty 2

## 2017-11-25 MED ORDER — LIDOCAINE 2% (20 MG/ML) 5 ML SYRINGE
INTRAMUSCULAR | Status: AC
  Filled 2017-11-25: qty 5

## 2017-11-25 MED ORDER — SENNOSIDES-DOCUSATE SODIUM 8.6-50 MG PO TABS: 1.0000 | ORAL_TABLET | Freq: Every evening | ORAL | Status: DC | PRN

## 2017-11-25 MED ORDER — CELECOXIB 200 MG PO CAPS
200.0000 mg | ORAL_CAPSULE | ORAL | Status: AC
  Administered 2017-11-25: 200 mg via ORAL
  Filled 2017-11-25: qty 1

## 2017-11-25 MED ORDER — ACETAMINOPHEN 650 MG RE SUPP: 650.0000 mg | Freq: Four times a day (QID) | RECTAL | Status: DC | PRN

## 2017-11-25 MED ORDER — ONDANSETRON HCL 4 MG/2ML IJ SOLN
4.0000 mg | Freq: Four times a day (QID) | INTRAMUSCULAR | Status: DC | PRN
  Administered 2017-11-26 – 2017-11-29 (×3): 4 mg via INTRAVENOUS
  Filled 2017-11-25 (×6): qty 2

## 2017-11-25 MED ORDER — MIDAZOLAM HCL 2 MG/2ML IJ SOLN
INTRAMUSCULAR | Status: AC
  Filled 2017-11-25: qty 2

## 2017-11-25 MED ORDER — DIPHENHYDRAMINE HCL 50 MG/ML IJ SOLN: 12.5000 mg | Freq: Four times a day (QID) | INTRAMUSCULAR | Status: DC | PRN

## 2017-11-25 MED ORDER — ENOXAPARIN SODIUM 40 MG/0.4ML ~~LOC~~ SOLN
40.0000 mg | SUBCUTANEOUS | Status: DC
  Administered 2017-11-26 – 2017-12-02 (×7): 40 mg via SUBCUTANEOUS
  Filled 2017-11-25 (×7): qty 0.4

## 2017-11-25 MED ORDER — PROMETHAZINE HCL 12.5 MG PO TABS: 12.5000 mg | ORAL_TABLET | Freq: Three times a day (TID) | ORAL | Status: DC | PRN

## 2017-11-25 MED ORDER — CEFAZOLIN SODIUM-DEXTROSE 2-4 GM/100ML-% IV SOLN
2.0000 g | INTRAVENOUS | Status: AC
  Administered 2017-11-25: 2 g via INTRAVENOUS
  Filled 2017-11-25: qty 100

## 2017-11-25 MED ORDER — KCL IN DEXTROSE-NACL 20-5-0.45 MEQ/L-%-% IV SOLN
INTRAVENOUS | Status: DC
  Administered 2017-11-25 – 2017-12-01 (×10): via INTRAVENOUS
  Filled 2017-11-25 (×11): qty 1000

## 2017-11-25 MED ORDER — ONDANSETRON 4 MG PO TBDP: 4.0000 mg | ORAL_TABLET | Freq: Four times a day (QID) | ORAL | Status: DC | PRN

## 2017-11-25 MED ORDER — FENTANYL CITRATE (PF) 100 MCG/2ML IJ SOLN
25.0000 ug | INTRAMUSCULAR | Status: DC | PRN
  Administered 2017-11-25 (×2): 25 ug via INTRAVENOUS

## 2017-11-25 MED ORDER — DEXAMETHASONE SODIUM PHOSPHATE 10 MG/ML IJ SOLN
INTRAMUSCULAR | Status: DC | PRN
  Administered 2017-11-25: 10 mg via INTRAVENOUS

## 2017-11-25 MED ORDER — METHOCARBAMOL 500 MG PO TABS
500.0000 mg | ORAL_TABLET | Freq: Four times a day (QID) | ORAL | Status: DC | PRN
  Administered 2017-11-28 – 2017-12-02 (×3): 500 mg via ORAL
  Filled 2017-11-25 (×3): qty 1

## 2017-11-25 MED ORDER — INFLUENZA VAC SPLIT QUAD 0.5 ML IM SUSY
0.5000 mL | PREFILLED_SYRINGE | INTRAMUSCULAR | Status: AC
  Administered 2017-11-28: 0.5 mL via INTRAMUSCULAR
  Filled 2017-11-25: qty 0.5

## 2017-11-25 MED ORDER — BUPIVACAINE LIPOSOME 1.3 % IJ SUSP
20.0000 mL | INTRAMUSCULAR | Status: AC
  Administered 2017-11-25: 20 mL
  Filled 2017-11-25: qty 20

## 2017-11-25 MED ORDER — FENTANYL CITRATE (PF) 250 MCG/5ML IJ SOLN
INTRAMUSCULAR | Status: AC
  Filled 2017-11-25: qty 5

## 2017-11-25 MED ORDER — ZOLPIDEM TARTRATE 5 MG PO TABS
5.0000 mg | ORAL_TABLET | Freq: Every evening | ORAL | Status: DC | PRN
  Administered 2017-12-02: 5 mg via ORAL
  Filled 2017-11-25 (×2): qty 1

## 2017-11-25 MED ORDER — PHENYLEPHRINE 40 MCG/ML (10ML) SYRINGE FOR IV PUSH (FOR BLOOD PRESSURE SUPPORT)
PREFILLED_SYRINGE | INTRAVENOUS | Status: AC
  Filled 2017-11-25: qty 10

## 2017-11-25 MED ORDER — ACETAMINOPHEN 500 MG PO TABS
1000.0000 mg | ORAL_TABLET | ORAL | Status: AC
  Administered 2017-11-25: 1000 mg via ORAL
  Filled 2017-11-25: qty 2

## 2017-11-25 MED ORDER — PANTOPRAZOLE SODIUM 40 MG PO TBEC
40.0000 mg | DELAYED_RELEASE_TABLET | Freq: Every day | ORAL | Status: DC
  Administered 2017-11-26 – 2017-12-02 (×7): 40 mg via ORAL
  Filled 2017-11-25 (×7): qty 1

## 2017-11-25 MED ORDER — ACETAMINOPHEN 325 MG PO TABS
650.0000 mg | ORAL_TABLET | Freq: Four times a day (QID) | ORAL | Status: DC | PRN
  Administered 2017-11-26 – 2017-11-29 (×6): 650 mg via ORAL
  Filled 2017-11-25 (×6): qty 2

## 2017-11-25 MED ORDER — ONDANSETRON HCL 4 MG/2ML IJ SOLN: 4.0000 mg | Freq: Once | INTRAMUSCULAR | Status: DC | PRN

## 2017-11-25 MED ORDER — DEXMEDETOMIDINE HCL 200 MCG/2ML IV SOLN
INTRAVENOUS | Status: DC | PRN
  Administered 2017-11-25: 8 ug via INTRAVENOUS
  Administered 2017-11-25: 4 ug via INTRAVENOUS
  Administered 2017-11-25 (×2): 8 ug via INTRAVENOUS

## 2017-11-25 MED ORDER — LIDOCAINE 2% (20 MG/ML) 5 ML SYRINGE
INTRAMUSCULAR | Status: DC | PRN
  Administered 2017-11-25: 100 mg via INTRAVENOUS

## 2017-11-25 MED ORDER — KETOROLAC TROMETHAMINE 30 MG/ML IJ SOLN
30.0000 mg | Freq: Four times a day (QID) | INTRAMUSCULAR | Status: AC
  Administered 2017-11-25 – 2017-11-26 (×4): 30 mg via INTRAVENOUS
  Filled 2017-11-25 (×3): qty 1

## 2017-11-25 MED ORDER — PROPOFOL 10 MG/ML IV BOLUS
INTRAVENOUS | Status: AC
  Filled 2017-11-25: qty 20

## 2017-11-25 MED ORDER — OXYCODONE HCL 5 MG PO TABS: 5.0000 mg | ORAL_TABLET | Freq: Once | ORAL | Status: DC | PRN

## 2017-11-25 MED ORDER — 0.9 % SODIUM CHLORIDE (POUR BTL) OPTIME
TOPICAL | Status: DC | PRN
  Administered 2017-11-25: 1000 mL

## 2017-11-25 MED ORDER — KETOROLAC TROMETHAMINE 30 MG/ML IJ SOLN
INTRAMUSCULAR | Status: AC
  Administered 2017-11-25: 30 mg via INTRAVENOUS
  Filled 2017-11-25: qty 1

## 2017-11-25 MED ORDER — DIPHENHYDRAMINE HCL 12.5 MG/5ML PO ELIX: 12.5000 mg | ORAL_SOLUTION | Freq: Four times a day (QID) | ORAL | Status: DC | PRN

## 2017-11-25 MED ORDER — CEFAZOLIN SODIUM-DEXTROSE 2-4 GM/100ML-% IV SOLN
2.0000 g | Freq: Three times a day (TID) | INTRAVENOUS | Status: AC
  Administered 2017-11-25: 2 g via INTRAVENOUS
  Filled 2017-11-25: qty 100

## 2017-11-25 MED ORDER — SODIUM CHLORIDE 0.9% FLUSH: 9.0000 mL | INTRAVENOUS | Status: DC | PRN

## 2017-11-25 MED ORDER — ROCURONIUM BROMIDE 10 MG/ML (PF) SYRINGE
PREFILLED_SYRINGE | INTRAVENOUS | Status: DC | PRN
  Administered 2017-11-25 (×2): 10 mg via INTRAVENOUS
  Administered 2017-11-25: 30 mg via INTRAVENOUS
  Administered 2017-11-25: 50 mg via INTRAVENOUS

## 2017-11-25 MED ORDER — EVICEL 5 ML EX KIT
PACK | CUTANEOUS | Status: DC | PRN
  Administered 2017-11-25: 5 mL

## 2017-11-25 MED ORDER — BUPIVACAINE-EPINEPHRINE (PF) 0.25% -1:200000 IJ SOLN
INTRAMUSCULAR | Status: DC | PRN
  Administered 2017-11-25: 7 mL
  Administered 2017-11-25: 23 mL

## 2017-11-25 MED ORDER — PROPOFOL 10 MG/ML IV BOLUS
INTRAVENOUS | Status: DC | PRN
  Administered 2017-11-25: 200 mg via INTRAVENOUS

## 2017-11-25 MED ORDER — GABAPENTIN 300 MG PO CAPS
300.0000 mg | ORAL_CAPSULE | ORAL | Status: AC
  Administered 2017-11-25: 300 mg via ORAL
  Filled 2017-11-25: qty 1

## 2017-11-25 MED ORDER — OXYCODONE HCL 5 MG/5ML PO SOLN: 5.0000 mg | Freq: Once | ORAL | Status: DC | PRN

## 2017-11-25 MED ORDER — STERILE WATER FOR IRRIGATION IR SOLN
Status: DC | PRN
  Administered 2017-11-25: 1000 mL

## 2017-11-25 MED ORDER — LACTATED RINGERS IV SOLN
INTRAVENOUS | Status: DC | PRN
  Administered 2017-11-25 (×2): via INTRAVENOUS

## 2017-11-25 MED ORDER — BUPIVACAINE-EPINEPHRINE (PF) 0.25% -1:200000 IJ SOLN
INTRAMUSCULAR | Status: AC
  Filled 2017-11-25: qty 30

## 2017-11-25 MED ORDER — FENTANYL CITRATE (PF) 100 MCG/2ML IJ SOLN
INTRAMUSCULAR | Status: DC | PRN
  Administered 2017-11-25: 25 ug via INTRAVENOUS
  Administered 2017-11-25 (×5): 50 ug via INTRAVENOUS
  Administered 2017-11-25: 25 ug via INTRAVENOUS

## 2017-11-25 MED ORDER — SODIUM CHLORIDE 0.9 % IV SOLN
INTRAVENOUS | Status: DC | PRN
  Administered 2017-11-25: 50 ug/min via INTRAVENOUS

## 2017-11-25 MED ORDER — NALOXONE HCL 0.4 MG/ML IJ SOLN: 0.4000 mg | INTRAMUSCULAR | Status: DC | PRN

## 2017-11-25 MED ORDER — MIDAZOLAM HCL 5 MG/5ML IJ SOLN
INTRAMUSCULAR | Status: DC | PRN
  Administered 2017-11-25: 2 mg via INTRAVENOUS

## 2017-11-25 SURGICAL SUPPLY — 62 items
ADH SKN CLS APL DERMABOND .7 (GAUZE/BANDAGES/DRESSINGS) ×1
APPLIER CLIP 5 13 M/L LIGAMAX5 (MISCELLANEOUS) ×3
APR CLP MED LRG 5 ANG JAW (MISCELLANEOUS) ×1
BIOPATCH RED 1 DISK 7.0 (GAUZE/BANDAGES/DRESSINGS) ×1 IMPLANT
BIOPATCH RED 1IN DISK 7.0MM (GAUZE/BANDAGES/DRESSINGS) ×1
BLADE CLIPPER SURG (BLADE) ×2 IMPLANT
CANISTER SUCT 3000ML PPV (MISCELLANEOUS) ×3 IMPLANT
CHLORAPREP W/TINT 26ML (MISCELLANEOUS) ×3 IMPLANT
CLIP APPLIE 5 13 M/L LIGAMAX5 (MISCELLANEOUS) ×1 IMPLANT
COVER SURGICAL LIGHT HANDLE (MISCELLANEOUS) ×3 IMPLANT
DERMABOND ADVANCED (GAUZE/BANDAGES/DRESSINGS) ×2
DERMABOND ADVANCED .7 DNX12 (GAUZE/BANDAGES/DRESSINGS) ×1 IMPLANT
DRAPE PROXIMA HALF (DRAPES) ×2 IMPLANT
DRAPE WARM FLUID 44X44 (DRAPE) ×3 IMPLANT
DRSG TEGADERM 4X4.75 (GAUZE/BANDAGES/DRESSINGS) ×3 IMPLANT
ELECT REM PT RETURN 9FT ADLT (ELECTROSURGICAL) ×3
ELECTRODE REM PT RTRN 9FT ADLT (ELECTROSURGICAL) ×1 IMPLANT
GLOVE BIO SURGEON STRL SZ 6 (GLOVE) ×5 IMPLANT
GLOVE INDICATOR 6.5 STRL GRN (GLOVE) ×5 IMPLANT
GOWN STRL REUS W/ TWL LRG LVL3 (GOWN DISPOSABLE) ×2 IMPLANT
GOWN STRL REUS W/TWL 2XL LVL3 (GOWN DISPOSABLE) ×3 IMPLANT
GOWN STRL REUS W/TWL LRG LVL3 (GOWN DISPOSABLE) ×12
KIT BASIN OR (CUSTOM PROCEDURE TRAY) ×3 IMPLANT
KIT TURNOVER KIT B (KITS) ×3 IMPLANT
L-HOOK LAP DISP 36CM (ELECTROSURGICAL) ×3
LHOOK LAP DISP 36CM (ELECTROSURGICAL) ×1 IMPLANT
NS IRRIG 1000ML POUR BTL (IV SOLUTION) ×5 IMPLANT
PACK LAPAROSCOPIC ABD 0248 (SET/KITS/TRAYS/PACK) ×3 IMPLANT
PAD ARMBOARD 7.5X6 YLW CONV (MISCELLANEOUS) ×9 IMPLANT
PENCIL SMOKE EVAC W/HOLSTER (ELECTROSURGICAL) ×3 IMPLANT
POUCH LAPAROSCOPIC INSTRUMENT (MISCELLANEOUS) ×3 IMPLANT
RELOAD STAPLE 60 2.6 WHT THN (STAPLE) IMPLANT
RELOAD STAPLE 60 3.6 BLU REG (STAPLE) IMPLANT
RELOAD STAPLER BLUE 60MM (STAPLE) ×2 IMPLANT
RELOAD STAPLER WHITE 60MM (STAPLE) ×1 IMPLANT
SCISSORS LAP 5X35 DISP (ENDOMECHANICALS) ×3 IMPLANT
SET IRRIG TUBING LAPAROSCOPIC (IRRIGATION / IRRIGATOR) ×3 IMPLANT
SHEARS HARMONIC ACE PLUS 36CM (ENDOMECHANICALS) ×3 IMPLANT
SLEEVE ENDOPATH XCEL 5M (ENDOMECHANICALS) ×6 IMPLANT
SLEEVE SURGEON STRL (DRAPES) ×3 IMPLANT
STAPLE ECHEON FLEX 60 POW ENDO (STAPLE) ×2 IMPLANT
STAPLER RELOAD BLUE 60MM (STAPLE) ×6
STAPLER RELOAD WHITE 60MM (STAPLE) ×3
STRIP PERI DRY VERITAS 60 (STAPLE) ×4 IMPLANT
SUT ETHILON 2 0 FS 18 (SUTURE) ×3 IMPLANT
SUT MNCRL AB 4-0 PS2 18 (SUTURE) ×2 IMPLANT
SUT PDS AB 1 TP1 54 (SUTURE) ×4 IMPLANT
SUT VIC AB 2-0 SH 27 (SUTURE) ×3
SUT VIC AB 2-0 SH 27XBRD (SUTURE) IMPLANT
SUT VIC AB 3-0 SH 27 (SUTURE) ×6
SUT VIC AB 3-0 SH 27X BRD (SUTURE) IMPLANT
SYS LAPSCP GELPORT 120MM (MISCELLANEOUS) ×3
SYSTEM LAPSCP GELPORT 120MM (MISCELLANEOUS) IMPLANT
TIP RIGID 35CM EVICEL (HEMOSTASIS) ×2 IMPLANT
TRAY FOLEY MTR SLVR 14FR STAT (SET/KITS/TRAYS/PACK) ×3 IMPLANT
TROCAR XCEL 12X100 BLDLESS (ENDOMECHANICALS) ×3 IMPLANT
TROCAR XCEL NON-BLD 5MMX100MML (ENDOMECHANICALS) ×3 IMPLANT
TUBE CONNECTING 12'X1/4 (SUCTIONS) ×1
TUBE CONNECTING 12X1/4 (SUCTIONS) ×2 IMPLANT
TUBING INSUFFLATION (TUBING) ×3 IMPLANT
WATER STERILE IRR 1000ML POUR (IV SOLUTION) ×3 IMPLANT
YANKAUER SUCT BULB TIP NO VENT (SUCTIONS) ×3 IMPLANT

## 2017-11-25 NOTE — Interval H&P Note (Signed)
History and Physical Interval Note:  11/25/2017 7:41 AM  Andre Simmons  has presented today for surgery, with the diagnosis of distal pancreatic mass  The various methods of treatment have been discussed with the patient and family. After consideration of risks, benefits and other options for treatment, the patient has consented to  Procedure(s): [place] (N/A) as a surgical intervention .  The patient's history has been reviewed, patient examined, no change in status, stable for surgery.  I have reviewed the patient's chart and labs.  Questions were answered to the patient's satisfaction.     Stark Klein

## 2017-11-25 NOTE — Transfer of Care (Signed)
Immediate Anesthesia Transfer of Care Note  Patient: Andre Simmons  Procedure(s) Performed: LAPAROSCOPIC DISTAL PANCREATECTOMY AND SPLENECTOMY ERAS PATHWAY (N/A Abdomen)  Patient Location: PACU  Anesthesia Type:General  Level of Consciousness: drowsy and patient cooperative  Airway & Oxygen Therapy: Patient Spontanous Breathing  Post-op Assessment: Report given to RN and Post -op Vital signs reviewed and stable  Post vital signs: Reviewed and stable  Last Vitals:  Vitals Value Taken Time  BP    Temp    Pulse 79 11/25/2017 12:51 PM  Resp 20 11/25/2017 12:51 PM  SpO2 94 % 11/25/2017 12:51 PM  Vitals shown include unvalidated device data.  Last Pain:  Vitals:   11/25/17 0809  TempSrc:   PainSc: 6       Patients Stated Pain Goal: 1 (34/14/43 6016)  Complications: No apparent anesthesia complications

## 2017-11-25 NOTE — Plan of Care (Signed)
  Problem: Education: Goal: Knowledge of General Education information will improve Description Including pain rating scale, medication(s)/side effects and non-pharmacologic comfort measures Outcome: Progressing   Problem: Health Behavior/Discharge Planning: Goal: Ability to manage health-related needs will improve Outcome: Progressing   Problem: Clinical Measurements: Goal: Ability to maintain clinical measurements within normal limits will improve Outcome: Progressing Goal: Will remain free from infection Outcome: Progressing Goal: Respiratory complications will improve Outcome: Progressing Goal: Cardiovascular complication will be avoided Outcome: Progressing   Problem: Activity: Goal: Risk for activity intolerance will decrease Outcome: Progressing   Problem: Nutrition: Goal: Adequate nutrition will be maintained Outcome: Progressing   Problem: Coping: Goal: Level of anxiety will decrease Outcome: Progressing   Problem: Pain Managment: Goal: General experience of comfort will improve Outcome: Progressing   Problem: Safety: Goal: Ability to remain free from injury will improve Outcome: Progressing   Problem: Skin Integrity: Goal: Risk for impaired skin integrity will decrease Outcome: Progressing   Problem: Clinical Measurements: Goal: Postoperative complications will be avoided or minimized Outcome: Progressing   Problem: Skin Integrity: Goal: Demonstration of wound healing without infection will improve Outcome: Progressing   Forestine Chute, RN 11/25/2017 11:40 PM

## 2017-11-25 NOTE — Progress Notes (Signed)
Received patient from OR s/p Laparoscopic distal pancreotomy and spleenectomy report given by teresa. Patient alert and oriented, IV lines intact and patent, foley catheter intact wound incision on abdomen intact no drainage.

## 2017-11-25 NOTE — H&P (Signed)
Andre Simmons Documented: 10/14/2017 2:48 PM Location: [facility] Office Patient #: [id number] DOB: 1975/11/21 Single / Language: Andre Simmons / Race: White Male   History of Present Illness The patient is a 42 year old male who presents for a follow-up for Pancreatic mass. Patient is a 42 year old male who was diagnosed with a pancreatic mass in July 2019 after presenting with pain. He underwent an endoscopic ultrasound with biopsy on August 1. He did well the night of the procedure, but awoke with pain the next day with nausea and vomiting. He went to the emergency department and was found to have pancreatitis. He was admitted to the medical service from 8/2-8/16. He was noted initially improving and repeat CT showed worsening of his pancreatitis but without pseudocyst formation.  He had significant issues with pain control. He was discharged home on MS Contin and as needed Dilaudid every 6 hours. He continues to have decreased appetite, but says it is improving a little bit. He does not have pain, but does have nausea. He is working down on the pain medication. He does have a history of disability for musculoskeletal issues.  His cytology results were consistent with either neuroendocrine tumor or solid pseudopapillary tumor.   EUS 09/25/17 mass measured 3.5 mm in maximal cross-sectional diameter. The outer margins were irregular. Fine needle aspiration for cytology was performed. Color Doppler imaging was utilized prior to needle puncture to confirm a lack of significant vascular structures within the needle path. Three passes were made with the 22 gauge needle using a transgastric approach. Final cytology results are pending 2. The pancreatic parenchyma was otherwise normal. 3. Normal main pancreatic duct. 4. No peripancreatic adenopathy. 5. Limited views of the liver, spleen were normal.  cytology 09/25/17 Diagnosis FINE NEEDLE ASPIRATION, PANCREAS, BODY (SPECIMEN 1 OF 1 COLLECTED  09/25/2017): MONOMORPHIC SMALL ROUND BLUE CELL PROLIFERATION. SEE COMMENT. COMMENT: THE SPECIMEN CONTAINS SMALL AND MEDIUM SIZED GROUPS OF RELATIVELY MONOMORPHIC SMALL ROUND BLUE CELLS WITH NEUROENDOCRINE FEATURES, INCLUDING SMALL UNIFORM NUCLEI. THE DIFFERENTIAL DIAGNOSIS INCLUDES A LOW GRADE NEUROENDOCRINE TUMOR INCLUDING A CARCINOID TUMOR AS WELL AS A SOLID PSEUDOPAPILLARY TUMOR.   CT abd/pelvis 09/26/17 FINDINGS: Lower chest: Heart size normal. Visualized lung bases clear.  Hepatobiliary: Liver normal in size and appearance. Gallbladder normal in appearance without calcified gallstones. No biliary ductal dilation.  Pancreas: Mild edema/inflammation surrounding the body and tail of the pancreas, new since the most recent prior MRI and CT. The calcified heterogeneous soft tissue mass involving the pancreatic tail is unchanged, measuring approximately 3.8 x 3.2 cm (series 2, image 36). Remainder of the pancreas remains normal in appearance.  Spleen: Normal in size and appearance.  Adrenals/Urinary Tract: Normal appearing adrenal glands. Kidneys normal in size and appearance without focal parenchymal abnormality. No evidence of urinary tract calculi or obstruction. Normal-appearing urinary bladder.  Stomach/Bowel: Stomach normal in appearance for the degree of distention. Normal-appearing small bowel. Scattered sigmoid colon diverticula without evidence of acute diverticulitis. Remainder of the colon normal in appearance. Normal caliber appendix containing a small appendicolith, without evidence of periappendiceal inflammation.  Vascular/Lymphatic: Aortoiliac atherosclerosis, advanced for patient age, without evidence of aneurysm. Normal-appearing portal venous and systemic venous systems.  No pathologic lymphadenopathy.  Reproductive: Prostate gland and seminal vesicles normal in size and appearance for age.  Other: None.  Musculoskeletal: Degenerative disc disease,  spondylosis and facet degenerative changes at L5-S1. No acute findings.  IMPRESSION: 1. Acute pancreatitis involving the body and tail of the pancreas. 2. Stable calcified mass involving the  pancreatic tail. 3. No evidence of metastatic disease. 4. Aortic Atherosclerosis, advanced for patient age. (ICD10-170.0)   Past Surgical History  Knee Surgery  Left. Oral Surgery  Spinal Surgery - Lower Back   Diagnostic Studies History  Colonoscopy  never  Allergies No Known Drug Allergies   Medication History HYDROmorphone HCl (2MG  Tablet, Oral) Active. Morphine Sulfate ER (15MG  Tablet ER, Oral) Active. Omeprazole (40MG  Capsule DR, Oral) Active. Sertraline HCl (50MG  Tablet, Oral) Active. Medications Reconciled  Social History Alcohol use  Occasional alcohol use. Caffeine use  Carbonated beverages, Coffee. No drug use   Family History Arthritis  Mother. Thyroid problems  Mother.  Other Problems Anxiety Disorder  Arthritis  Back Pain  Depression  Gastroesophageal Reflux Disease  Pancreatitis     Review of Systems  General Present- Appetite Loss, Fatigue and Weight Loss. Not Present- Chills, Fever, Night Sweats and Weight Gain. Skin Not Present- Change in Wart/Mole, Dryness, Hives, Jaundice, New Lesions, Non-Healing Wounds, Rash and Ulcer. HEENT Not Present- Earache, Hearing Loss, Hoarseness, Nose Bleed, Oral Ulcers, Ringing in the Ears, Seasonal Allergies, Sinus Pain, Sore Throat, Visual Disturbances, Wears glasses/contact lenses and Yellow Eyes. Respiratory Not Present- Bloody sputum, Chronic Cough, Difficulty Breathing, Snoring and Wheezing. Breast Not Present- Breast Mass, Breast Pain, Nipple Discharge and Skin Changes. Cardiovascular Not Present- Chest Pain, Difficulty Breathing Lying Down, Leg Cramps, Palpitations, Rapid Heart Rate, Shortness of Breath and Swelling of Extremities. Gastrointestinal Present- Abdominal Pain, Gets full quickly at  meals, Indigestion, Nausea and Vomiting. Not Present- Bloating, Bloody Stool, Change in Bowel Habits, Chronic diarrhea, Constipation, Difficulty Swallowing, Excessive gas, Hemorrhoids and Rectal Pain. Male Genitourinary Not Present- Blood in Urine, Change in Urinary Stream, Frequency, Impotence, Nocturia, Painful Urination, Urgency and Urine Leakage. Musculoskeletal Present- Back Pain and Joint Pain. Not Present- Joint Stiffness, Muscle Pain, Muscle Weakness and Swelling of Extremities. Neurological Not Present- Decreased Memory, Fainting, Headaches, Numbness, Seizures, Tingling, Tremor, Trouble walking and Weakness. Psychiatric Present- Anxiety, Change in Sleep Pattern and Depression. Not Present- Bipolar, Fearful and Frequent crying. Endocrine Not Present- Cold Intolerance, Excessive Hunger, Hair Changes, Heat Intolerance, Hot flashes and New Diabetes. Hematology Not Present- Blood Thinners, Easy Bruising, Excessive bleeding, Gland problems, HIV and Persistent Infections.  Vitals Weight: 209.25 lb Height: 75in Body Surface Area: 2.24 m Body Mass Index: 26.15 kg/m  Temp.: 12F(Oral)  Pulse: 94 (Regular)  BP: 130/80 (Sitting, Left Arm, Standard)     Physical Exam General Mental Status-Alert. General Appearance-Consistent with stated age. Hydration-Well hydrated. Voice-Normal. Note: looks a bit weak and a bit pale.   Head and Neck Head-normocephalic, atraumatic with no lesions or palpable masses.  Eye Sclera/Conjunctiva - Bilateral-No scleral icterus.  Chest and Lung Exam Chest and lung exam reveals -quiet, even and easy respiratory effort with no use of accessory muscles. Inspection Chest Wall - Normal. Back - normal.  Breast - Did not examine.  Cardiovascular Cardiovascular examination reveals -normal pedal pulses bilaterally. Note: regular rate and rhythm  Abdomen Inspection-Inspection Normal. Palpation/Percussion Palpation and  Percussion of the abdomen reveal - Soft, No Rebound tenderness, No Rigidity (guarding) and No hepatosplenomegaly. Note: mild tenderness upper abdomen. No fullness or mass appreciated.  Peripheral Vascular Upper Extremity Inspection - Bilateral - Normal - No Clubbing, No Cyanosis, No Edema, Pulses Intact. Lower Extremity Palpation - Edema - Bilateral - No edema.  Neurologic Neurologic evaluation reveals -alert and oriented x 3 with no impairment of recent or remote memory. Mental Status-Normal.  Musculoskeletal Global Assessment -Note: no gross deformities.  Normal Exam -  Left-Upper Extremity Strength Normal and Lower Extremity Strength Normal. Normal Exam - Right-Upper Extremity Strength Normal and Lower Extremity Strength Normal.  Lymphatic Head & Neck  General Head & Neck Lymphatics: Bilateral - Description - Normal. Axillary  General Axillary Region: Bilateral - Description - Normal. Tenderness - Non Tender.    Assessment & Plan  PANCREATITIS (K85.90) Impression: This patient had severe pancreatitis. He did not require admission to the ICU, but did have significant SIRS. He is at risk for developing a pseudocyst based on the magnitude of his pancreatitis. I am going to get a repeat CT to make sure he has not developed a pseudocyst prior to surgery. If he has, we would need to let that resolve or intervene prior to resection. He also has significant pain which is also expected with this magnitude of pancreatitis. I am refilling his pain medication. I advised him that he needs to wean off of the pain medication because it will be much more difficult to manage his postoperative pain if he is still requiring narcotics preop. Current Plans Started Morphine Sulfate ER 15 MG Oral Tablet Extended Release, 1 (one) Tablet bid, #20, 10 days starting 10/14/2017, No Refill. Started Dilaudid 2 MG Oral Tablet, 1/2-1 Tablet every 6 hours as needed for pain, #30, 10/14/2017, No  Refill. PANCREATIC MASS (K86.89) Impression: This patient has a solid distal pancreatic mass that will need resection. Thankfully it is far enough to the end that he hopefully will not lose much active pancreatic parenchyma. Given the size of the tumor and previous pancreatitis, I am planning on resecting the spleen. I discussed the procedure again with the patient. I reviewed risks of surgery. I discussed timing of surgery and postop recovery and restrictions. I stressed that the most likely risk is infection from pancreatic leak. We also discussed risk of infecting his hardware from his joint replacements. This risk is low but not 0. We will keep this in mind if he develops infectious issues in the hospital. Orders have been submitted and we are waiting to set this up. Current Plans Pt Education - FB pancreatectomy You are being scheduled for surgery- Our schedulers will call you.  You should hear from our office's scheduling department within 5 working days about the location, date, and time of surgery. We try to make accommodations for patient's preferences in scheduling surgery, but sometimes the OR schedule or the surgeon's schedule prevents [place] from making those accommodations.  If you have not heard from our office [phone] in 5 working days, call the office and ask for your surgeon's nurse.  If you have other questions about your diagnosis, plan, or surgery, call the office and ask for your surgeon's nurse.

## 2017-11-25 NOTE — Anesthesia Preprocedure Evaluation (Signed)
Anesthesia Evaluation  Patient identified by MRN, date of birth, ID band Patient awake    Reviewed: Allergy & Precautions, NPO status , Patient's Chart, lab work & pertinent test results  History of Anesthesia Complications Negative for: history of anesthetic complications  Airway Mallampati: II  TM Distance: >3 FB Neck ROM: Full    Dental no notable dental hx.    Pulmonary neg pulmonary ROS, former smoker,    Pulmonary exam normal        Cardiovascular negative cardio ROS Normal cardiovascular exam     Neuro/Psych PSYCHIATRIC DISORDERS Depression negative neurological ROS     GI/Hepatic Neg liver ROS, GERD  Controlled and Medicated,  Endo/Other  negative endocrine ROS  Renal/GU negative Renal ROS  negative genitourinary   Musculoskeletal negative musculoskeletal ROS (+)   Abdominal   Peds  Hematology negative hematology ROS (+)   Anesthesia Other Findings   Reproductive/Obstetrics                             Anesthesia Physical Anesthesia Plan  ASA: II  Anesthesia Plan: General   Post-op Pain Management:    Induction: Intravenous  PONV Risk Score and Plan: 3 and Ondansetron, Dexamethasone, Treatment may vary due to age or medical condition and Midazolam  Airway Management Planned: Oral ETT  Additional Equipment: Arterial line  Intra-op Plan:   Post-operative Plan: Extubation in OR  Informed Consent: I have reviewed the patients History and Physical, chart, labs and discussed the procedure including the risks, benefits and alternatives for the proposed anesthesia with the patient or authorized representative who has indicated his/her understanding and acceptance.     Plan Discussed with:   Anesthesia Plan Comments:         Anesthesia Quick Evaluation

## 2017-11-25 NOTE — Anesthesia Procedure Notes (Signed)
Arterial Line Insertion Performed by: Lidia Collum, MD, Verdie Drown, CRNA, CRNA  Preanesthetic checklist: patient identified, IV checked, site marked, risks and benefits discussed, surgical consent, monitors and equipment checked, pre-op evaluation, timeout performed and anesthesia consent Lidocaine 1% used for infiltration Left, radial was placed Catheter size: 20 G Hand hygiene performed , maximum sterile barriers used  and Seldinger technique used Allen's test indicative of satisfactory collateral circulation Attempts: 1 Procedure performed without using ultrasound guided technique. Following insertion, dressing applied and Biopatch. Post procedure assessment: normal  Patient tolerated the procedure well with no immediate complications.

## 2017-11-25 NOTE — Anesthesia Procedure Notes (Signed)
Procedure Name: Intubation Date/Time: 11/25/2017 10:07 AM Performed by: Milford Cage, CRNA Pre-anesthesia Checklist: Patient identified, Emergency Drugs available, Suction available and Patient being monitored Patient Re-evaluated:Patient Re-evaluated prior to induction Oxygen Delivery Method: Circle System Utilized Preoxygenation: Pre-oxygenation with 100% oxygen Induction Type: IV induction Ventilation: Two handed mask ventilation required and Mask ventilation without difficulty Laryngoscope Size: Mac and 4 Grade View: Grade II Tube type: Oral Tube size: 8.0 mm Number of attempts: 1 Airway Equipment and Method: Stylet Placement Confirmation: ETT inserted through vocal cords under direct vision,  positive ETCO2 and breath sounds checked- equal and bilateral Secured at: 24 cm Tube secured with: Tape Dental Injury: Teeth and Oropharynx as per pre-operative assessment

## 2017-11-25 NOTE — Op Note (Signed)
PREOPERATIVE DIAGNOSIS:  cT2 N0 M0 pancreatic neuroendocrine tumor     POSTOPERATIVE DIAGNOSIS:  Same      PROCEDURE:  Diagnostic laparoscopy, laparoscopic hand-assisted distal   pancreatectomy and splenectomy.      SURGEON:  Stark Klein, MD      ASSISTANT:  Romana Juniper, M.D.      ANESTHESIA:  General and local.      FINDINGS:  Pancreas with adhesions to splenic vessels, not unexpected given prior pancreatitis      SPECIMEN:  Distal pancreas and spleen to Pathology.      ESTIMATED BLOOD LOSS:  50 mL      COMPLICATIONS:  None known.      PROCEDURE:  Patient was identified in the holding area and taken to   operating room where he was placed supine on the operating room table.   General anesthesia was induced.  Foley catheter was placed.  He was   placed in the leaning spleen position.  All appropriate areas were padded.  His abdomen was prepped and draped in sterile fashion.  Time-out was performed according to surgical   safety check list.  When all was correct, we continued.    The upper midline was incised with a #10 blade for a hand port.  The subcutaneous tissues and fascia were opened with the cautery.  The peritoneum was opened bluntly.    Pneumoperitoneum was achieved to a pressure of 15 mm Hg.  Three 5 mm trocars were placed in the left abdomen.   The lienocolic ligament was  taken down with the scissors.  The lesser sac was opened with the   Harmonic and all the adhesions were taken down.  Once the   stomach was completely off the spleen, the inferior border of the   pancreas was identified and this was opened up with the Harmonic.  The pancreas and the splenic   artery and vein were elevated.     The pancreas was then gently isolated off  the vessels.  A 12 mm port was placed through the gel port.  The splenic artery and vein were divided with the white load of the powered echelon stapler.  The pancreas was stapled with 2 blue loads of the   Echelon stapler with  peristrips.   The posterior attachments were then taken off with the Harmonic scalpel and the spleen was disconnected from the   posterior attachments.  The specimen was then removed from the hand   port.  The area was inspected for hemostasis and there was a small area   at the end of pancreas that was coagulated with the harmonic.  This   stopped the bleeding.    The specimen was sent for frozen and margins was negative. Tentative dx was neuroendocrine tumor.  The abdomen was irrigated and there was   no sign of any additional bleeding.  Evicel was placed over the tail of   the pancreas.  The 19 Blake drain was then passed into the abdomen through   the hand port and pulled out through one of the other 5 mm trocars.   This was placed in the appropriate location and secured with a 2-0 nylon.    At this point, the other 5 mm trocar were removed and the fascia from the hand port was closed with running #1 PDS suture.  The hand port incision had interrupted 3-0 vicryl sutures placed in the deep dermal location.  The skin of all the incisions  was   then closed with 4-0 Monocryl in a subcuticular fashion and then dressed   with benzoin, Steri-Strips, and soft dressings.  The patient tolerated   the procedure well.  She was extubated and taken to the PACU in stable   condition.  Needle, sponge, and instrument counts were correct x2               Stark Klein, MD

## 2017-11-26 ENCOUNTER — Encounter (HOSPITAL_COMMUNITY): Payer: Self-pay | Admitting: General Surgery

## 2017-11-26 HISTORY — PX: LAPAROSCOPIC DISTAL PANCREATECTOMY: SHX1922

## 2017-11-26 LAB — BASIC METABOLIC PANEL
Anion gap: 10 (ref 5–15)
BUN: 12 mg/dL (ref 6–20)
CALCIUM: 8.6 mg/dL — AB (ref 8.9–10.3)
CHLORIDE: 102 mmol/L (ref 98–111)
CO2: 25 mmol/L (ref 22–32)
CREATININE: 0.79 mg/dL (ref 0.61–1.24)
GFR calc Af Amer: >60 mL/min (ref >60)
GFR calc non Af Amer: >60 mL/min (ref >60)
Glucose, Bld: 140 mg/dL — ABNORMAL HIGH (ref 70–99)
Potassium: 4.2 mmol/L (ref 3.5–5.1)
SODIUM: 137 mmol/L (ref 135–145)

## 2017-11-26 LAB — CBC
HCT: 41.2 % (ref 39.0–52.0)
HEMOGLOBIN: 13.3 g/dL (ref 13.0–17.0)
MCH: 31.1 pg (ref 26.0–34.0)
MCHC: 32.3 g/dL (ref 30.0–36.0)
MCV: 96.5 fL (ref 78.0–100.0)
Platelets: 362 10*3/uL (ref 150–400)
RBC: 4.27 MIL/uL (ref 4.22–5.81)
RDW: 13.1 % (ref 11.5–15.5)
WBC: 26.7 10*3/uL — ABNORMAL HIGH (ref 4.0–10.5)

## 2017-11-26 MED ORDER — HYDROMORPHONE 1 MG/ML IV SOLN
INTRAVENOUS | Status: DC
  Administered 2017-11-26: 1 mg via INTRAVENOUS
  Administered 2017-11-26: 0.5 mg via INTRAVENOUS
  Administered 2017-11-26: 3.3 mg via INTRAVENOUS
  Administered 2017-11-26: 2.5 mg via INTRAVENOUS
  Administered 2017-11-27: 1 mg via INTRAVENOUS
  Administered 2017-11-27 (×3): 1.5 mg via INTRAVENOUS
  Administered 2017-11-27 – 2017-11-28 (×3): 2 mg via INTRAVENOUS
  Administered 2017-11-28: 2.5 mg via INTRAVENOUS
  Administered 2017-11-28: 0.5 mg via INTRAVENOUS
  Administered 2017-11-28 (×2): 1 mg via INTRAVENOUS
  Administered 2017-11-28: 0.5 mg via INTRAVENOUS
  Administered 2017-11-28: 1.5 mg via INTRAVENOUS
  Administered 2017-11-29: 3.5 mg via INTRAVENOUS
  Administered 2017-11-29: 2 mg via INTRAVENOUS
  Administered 2017-11-29: 3 mg via INTRAVENOUS
  Administered 2017-11-29: 0.5 mg via INTRAVENOUS
  Administered 2017-11-29: 2.5 mg via INTRAVENOUS
  Administered 2017-11-29: 25 mg via INTRAVENOUS
  Administered 2017-11-30: 2.5 mg via INTRAVENOUS
  Administered 2017-11-30: 1.5 mg via INTRAVENOUS
  Administered 2017-11-30: 3 mg via INTRAVENOUS
  Administered 2017-11-30: 2 mg via INTRAVENOUS
  Administered 2017-11-30: 3 mg via INTRAVENOUS
  Administered 2017-11-30 – 2017-12-01 (×2): 3.5 mg via INTRAVENOUS
  Administered 2017-12-01: 2 mg via INTRAVENOUS
  Administered 2017-12-01: 6.7 mg via INTRAVENOUS
  Administered 2017-12-01: 1.5 mg via INTRAVENOUS
  Filled 2017-11-26 (×4): qty 25

## 2017-11-26 MED ORDER — ONDANSETRON HCL 4 MG/2ML IJ SOLN
4.0000 mg | Freq: Four times a day (QID) | INTRAMUSCULAR | Status: DC | PRN
  Administered 2017-11-26 – 2017-11-28 (×3): 4 mg via INTRAVENOUS

## 2017-11-26 MED ORDER — ORAL CARE MOUTH RINSE
15.0000 mL | Freq: Two times a day (BID) | OROMUCOSAL | Status: DC
  Administered 2017-11-26 – 2017-11-28 (×2): 15 mL via OROMUCOSAL

## 2017-11-26 MED ORDER — HAEMOPHILUS B POLYSAC CONJ VAC 10 MCG IJ SOLR
0.5000 mL | Freq: Once | INTRAMUSCULAR | Status: DC
  Filled 2017-11-26: qty 0.5

## 2017-11-26 MED ORDER — PNEUMOCOCCAL VAC POLYVALENT 25 MCG/0.5ML IJ INJ
0.5000 mL | INJECTION | Freq: Once | INTRAMUSCULAR | Status: AC
  Administered 2017-11-28: 0.5 mL via INTRAMUSCULAR
  Filled 2017-11-26: qty 0.5

## 2017-11-26 MED ORDER — SODIUM CHLORIDE 0.9 % IV SOLN
25.0000 mg | Freq: Four times a day (QID) | INTRAVENOUS | Status: DC | PRN
  Administered 2017-11-26: 25 mg via INTRAVENOUS
  Filled 2017-11-26 (×2): qty 1

## 2017-11-26 MED ORDER — SODIUM CHLORIDE 0.9% FLUSH: 9.0000 mL | INTRAVENOUS | Status: DC | PRN

## 2017-11-26 MED ORDER — NALOXONE HCL 0.4 MG/ML IJ SOLN: 0.4000 mg | INTRAMUSCULAR | Status: DC | PRN

## 2017-11-26 MED ORDER — ALUM & MAG HYDROXIDE-SIMETH 200-200-20 MG/5ML PO SUSP
30.0000 mL | Freq: Four times a day (QID) | ORAL | Status: DC | PRN
  Administered 2017-11-27: 30 mL via ORAL
  Filled 2017-11-26: qty 30

## 2017-11-26 MED ORDER — MENINGOCOCCAL A C Y&W-135 OLIG IM SOLR
0.5000 mL | Freq: Once | INTRAMUSCULAR | Status: DC
  Filled 2017-11-26: qty 0.5

## 2017-11-26 MED ORDER — DIPHENHYDRAMINE HCL 50 MG/ML IJ SOLN
12.5000 mg | Freq: Four times a day (QID) | INTRAMUSCULAR | Status: DC | PRN
  Administered 2017-11-27: 12.5 mg via INTRAVENOUS
  Filled 2017-11-26: qty 1

## 2017-11-26 MED ORDER — DIPHENHYDRAMINE HCL 12.5 MG/5ML PO ELIX: 12.5000 mg | ORAL_SOLUTION | Freq: Four times a day (QID) | ORAL | Status: DC | PRN

## 2017-11-26 NOTE — Progress Notes (Signed)
Pt refuses for foley to be placed at this time. Pt encouraged to drink fluids and awaiting void.

## 2017-11-26 NOTE — Plan of Care (Signed)
  Problem: Activity: Goal: Risk for activity intolerance will decrease Outcome: Progressing   Problem: Nutrition: Goal: Adequate nutrition will be maintained Outcome: Progressing   Problem: Pain Managment: Goal: General experience of comfort will improve Outcome: Progressing   

## 2017-11-26 NOTE — Progress Notes (Signed)
Dr. Barry Dienes made aware of pt having no urge to void and only 82ml in bladder on bladder scan. Dr. Barry Dienes ordered for foley to be placed. Will continue to monitor.

## 2017-11-26 NOTE — Progress Notes (Signed)
1 Day Post-Op   Subjective/Chief Complaint: C/o significant pain and hiccups.  Denies flatus.  No n/v.     Objective: Vital signs in last 24 hours: Temp:  [97.5 F (36.4 C)-98.5 F (36.9 C)] 98.2 F (36.8 C) (10/02 0540) Pulse Rate:  [62-104] 68 (10/02 0540) Resp:  [10-25] 25 (10/02 0540) BP: (101-144)/(63-97) 101/72 (10/02 0540) SpO2:  [93 %-100 %] 98 % (10/02 0540) Arterial Line BP: (139-160)/(75-83) 139/77 (10/01 1347) Weight:  [101.7 kg] 101.7 kg (10/01 2314) Last BM Date: 11/24/17  Intake/Output from previous day: 10/01 0701 - 10/02 0700 In: 2300 [I.V.:2300] Out: 2485 [Urine:2375; Drains:60; Blood:50] Intake/Output this shift: No intake/output data recorded.  General appearance: alert, cooperative and moderate distress Resp: breathing comfortably GI: soft, mildly distended, approp tender.  drain serosang Extremities: extremities normal, atraumatic, no cyanosis or edema  Lab Results:  Recent Labs    11/25/17 1441 11/26/17 0404  WBC 24.9* 26.7*  HGB 13.6 13.3  HCT 40.7 41.2  PLT 357 362   BMET Recent Labs    11/25/17 1441  CREATININE 1.11   PT/INR No results for input(s): LABPROT, INR in the last 72 hours. ABG No results for input(s): PHART, HCO3 in the last 72 hours.  Invalid input(s): PCO2, PO2  Studies/Results: No results found.  Anti-infectives: Anti-infectives (From admission, onward)   Start     Dose/Rate Route Frequency Ordered Stop   11/25/17 1800  ceFAZolin (ANCEF) IVPB 2g/100 mL premix     2 g 200 mL/hr over 30 Minutes Intravenous Every 8 hours 11/25/17 1425 11/25/17 1923   11/25/17 0715  ceFAZolin (ANCEF) IVPB 2g/100 mL premix     2 g 200 mL/hr over 30 Minutes Intravenous On call to O.R. 11/25/17 0709 11/25/17 1010      Assessment/Plan: s/p Procedure(s): LAPAROSCOPIC DISTAL PANCREATECTOMY AND SPLENECTOMY ERAS PATHWAY (N/A) d/c foley leave on clears.  Thorazine for hiccups. Increase PCA to custom dose.  Pt was on significant  narcotics with pancreatitis.  Has only been off these for 3-4 weeks. Leukocytosis expected for post splenectomy BMET pending.   Post splenectomy vaccines day of discharge.   LOS: 1 day    Stark Klein 11/26/2017

## 2017-11-26 NOTE — Progress Notes (Signed)
Pt voided 435ml of urine after ambulating to bathroom. Will continue to monitor.

## 2017-11-26 NOTE — Progress Notes (Signed)
Pt has no urge to void. Bladder scanned showed 8ml in bladder. Will notified MD.

## 2017-11-27 LAB — BASIC METABOLIC PANEL
Anion gap: 8 (ref 5–15)
BUN: 9 mg/dL (ref 6–20)
CALCIUM: 8.3 mg/dL — AB (ref 8.9–10.3)
CO2: 27 mmol/L (ref 22–32)
CREATININE: 0.85 mg/dL (ref 0.61–1.24)
Chloride: 101 mmol/L (ref 98–111)
Glucose, Bld: 121 mg/dL — ABNORMAL HIGH (ref 70–99)
Potassium: 4 mmol/L (ref 3.5–5.1)
SODIUM: 136 mmol/L (ref 135–145)

## 2017-11-27 LAB — GLUCOSE, CAPILLARY: GLUCOSE-CAPILLARY: 112 mg/dL — AB (ref 70–99)

## 2017-11-27 LAB — CBC
HEMATOCRIT: 39.4 % (ref 39.0–52.0)
Hemoglobin: 12.8 g/dL — ABNORMAL LOW (ref 13.0–17.0)
MCH: 31.8 pg (ref 26.0–34.0)
MCHC: 32.5 g/dL (ref 30.0–36.0)
MCV: 97.8 fL (ref 78.0–100.0)
PLATELETS: 336 10*3/uL (ref 150–400)
RBC: 4.03 MIL/uL — ABNORMAL LOW (ref 4.22–5.81)
RDW: 13.6 % (ref 11.5–15.5)
WBC: 27.4 10*3/uL — ABNORMAL HIGH (ref 4.0–10.5)

## 2017-11-27 MED ORDER — ZOLPIDEM TARTRATE 5 MG PO TABS
5.0000 mg | ORAL_TABLET | Freq: Every day | ORAL | Status: DC
  Administered 2017-11-27 – 2017-12-01 (×5): 5 mg via ORAL
  Filled 2017-11-27 (×4): qty 1

## 2017-11-27 NOTE — Anesthesia Postprocedure Evaluation (Signed)
Anesthesia Post Note  Patient: Andre Simmons  Procedure(s) Performed: LAPAROSCOPIC DISTAL PANCREATECTOMY AND SPLENECTOMY ERAS PATHWAY (N/A Abdomen)     Patient location during evaluation: PACU Anesthesia Type: General Level of consciousness: awake and alert Pain management: pain level controlled Vital Signs Assessment: post-procedure vital signs reviewed and stable Respiratory status: spontaneous breathing, nonlabored ventilation, respiratory function stable and patient connected to nasal cannula oxygen Cardiovascular status: blood pressure returned to baseline and stable Postop Assessment: no apparent nausea or vomiting Anesthetic complications: no    Last Vitals:  Vitals:   11/27/17 0745 11/27/17 1151  BP:    Pulse:    Resp: 14 (!) 22  Temp:    SpO2: 95% 94%    Last Pain:  Vitals:   11/27/17 1151  TempSrc:   PainSc: 3                  Lidia Collum

## 2017-11-27 NOTE — Plan of Care (Signed)
  Problem: Clinical Measurements: ?Goal: Ability to maintain clinical measurements within normal limits will improve ?Outcome: Progressing ?Goal: Will remain free from infection ?Outcome: Progressing ?Goal: Diagnostic test results will improve ?Outcome: Progressing ?Goal: Respiratory complications will improve ?Outcome: Progressing ?  ?Problem: Activity: ?Goal: Risk for activity intolerance will decrease ?Outcome: Progressing ?  ?

## 2017-11-27 NOTE — Progress Notes (Signed)
2 Days Post-Op   Subjective/Chief Complaint: Had urinary retention yesterday and had to have foley reinserted.  No n/v.  + flatus.  Pain a little better since PCA altered yesterday.  Temps to 100.6   Objective: Vital signs in last 24 hours: Temp:  [98.6 F (37 C)-100.6 F (38.1 C)] 98.6 F (37 C) (10/03 0656) Pulse Rate:  [72-101] 72 (10/03 0656) Resp:  [14-24] 14 (10/03 0745) BP: (105-120)/(68-76) 114/75 (10/03 0656) SpO2:  [92 %-99 %] 95 % (10/03 0745) Last BM Date: 11/24/17  Intake/Output from previous day: 10/02 0701 - 10/03 0700 In: 2752.8 [P.O.:222; I.V.:2505.8; IV Piggyback:25] Out: 7544 [Urine:1000; Drains:35] Intake/Output this shift: No intake/output data recorded.  General appearance: alert, cooperative and moderate distress Resp: breathing comfortably GI: soft, nondistended, approp tender.  drain serosang Extremities: extremities normal, atraumatic, no cyanosis or edema  Lab Results:  Recent Labs    11/26/17 0404 11/27/17 0346  WBC 26.7* 27.4*  HGB 13.3 12.8*  HCT 41.2 39.4  PLT 362 336   BMET Recent Labs    11/26/17 0404 11/27/17 0346  NA 137 136  K 4.2 4.0  CL 102 101  CO2 25 27  GLUCOSE 140* 121*  BUN 12 9  CREATININE 0.79 0.85  CALCIUM 8.6* 8.3*   PT/INR No results for input(s): LABPROT, INR in the last 72 hours. ABG No results for input(s): PHART, HCO3 in the last 72 hours.  Invalid input(s): PCO2, PO2  Studies/Results: No results found.  Anti-infectives: Anti-infectives (From admission, onward)   Start     Dose/Rate Route Frequency Ordered Stop   11/25/17 1800  ceFAZolin (ANCEF) IVPB 2g/100 mL premix     2 g 200 mL/hr over 30 Minutes Intravenous Every 8 hours 11/25/17 1425 11/25/17 1923   11/25/17 0715  ceFAZolin (ANCEF) IVPB 2g/100 mL premix     2 g 200 mL/hr over 30 Minutes Intravenous On call to O.R. 11/25/17 0709 11/25/17 1010      Assessment/Plan: s/p Procedure(s): LAPAROSCOPIC DISTAL PANCREATECTOMY AND SPLENECTOMY  ERAS PATHWAY (N/A) Full liquid diet. Ambulate/IS Thorazine for hiccups.  PCA to custom dose.  Pt was on significant narcotics with pancreatitis.  Has only been off these for 3-4 weeks. Leukocytosis expected for post splenectomy  Post splenectomy vaccines day of discharge. Decrease IVF   LOS: 2 days    Stark Klein 11/27/2017

## 2017-11-28 LAB — BASIC METABOLIC PANEL
ANION GAP: 9 (ref 5–15)
BUN: 11 mg/dL (ref 6–20)
CALCIUM: 8.8 mg/dL — AB (ref 8.9–10.3)
CO2: 27 mmol/L (ref 22–32)
Chloride: 100 mmol/L (ref 98–111)
Creatinine, Ser: 0.77 mg/dL (ref 0.61–1.24)
GFR calc Af Amer: >60 mL/min (ref >60)
GFR calc non Af Amer: >60 mL/min (ref >60)
Glucose, Bld: 113 mg/dL — ABNORMAL HIGH (ref 70–99)
POTASSIUM: 4 mmol/L (ref 3.5–5.1)
Sodium: 136 mmol/L (ref 135–145)

## 2017-11-28 LAB — CBC
HEMATOCRIT: 35.5 % — AB (ref 39.0–52.0)
Hemoglobin: 11.8 g/dL — ABNORMAL LOW (ref 13.0–17.0)
MCH: 32 pg (ref 26.0–34.0)
MCHC: 33.2 g/dL (ref 30.0–36.0)
MCV: 96.2 fL (ref 78.0–100.0)
PLATELETS: 340 10*3/uL (ref 150–400)
RBC: 3.69 MIL/uL — ABNORMAL LOW (ref 4.22–5.81)
RDW: 13.2 % (ref 11.5–15.5)
WBC: 27.2 10*3/uL — AB (ref 4.0–10.5)

## 2017-11-28 MED ORDER — HAEMOPHILUS B POLYSAC CONJ VAC 10 MCG IJ SOLR
0.5000 mL | Freq: Once | INTRAMUSCULAR | Status: AC
  Administered 2017-11-29: 0.5 mL via INTRAMUSCULAR
  Filled 2017-11-28: qty 0.5

## 2017-11-28 MED ORDER — MENINGOCOCCAL A C Y&W-135 OLIG IM SOLR
0.5000 mL | Freq: Once | INTRAMUSCULAR | Status: AC
  Administered 2017-11-29: 0.5 mL via INTRAMUSCULAR
  Filled 2017-11-28: qty 0.5

## 2017-11-28 NOTE — Progress Notes (Signed)
3 Days Post-Op   Subjective/Chief Complaint: Temps improved, but patient still having quite a bit of pain.  + nausea/vomiting twice overnight.  Still passing gas, but doesn't desire to drink much.  Drain output down significantly.     Objective: Vital signs in last 24 hours: Temp:  [97.8 F (36.6 C)-99 F (37.2 C)] 98.4 F (36.9 C) (10/04 1023) Pulse Rate:  [68-82] 79 (10/04 1022) Resp:  [14-21] 17 (10/04 1239) BP: (111-128)/(71-83) 111/72 (10/04 1022) SpO2:  [94 %-100 %] 99 % (10/04 1239) Last BM Date: 11/24/17  Intake/Output from previous day: 10/03 0701 - 10/04 0700 In: 1623.9 [P.O.:360; I.V.:1263.9] Out: 1000 [Urine:800; Emesis/NG output:200] Intake/Output this shift: Total I/O In: 420 [P.O.:420] Out: -   General appearance: alert, cooperative and mild distress Resp: breathing comfortably GI: soft, nondistended, approp tender.  drain serosang Extremities: extremities normal, atraumatic, no cyanosis or edema  Lab Results:  Recent Labs    11/27/17 0346 11/28/17 0631  WBC 27.4* 27.2*  HGB 12.8* 11.8*  HCT 39.4 35.5*  PLT 336 340   BMET Recent Labs    11/27/17 0346 11/28/17 0631  NA 136 136  K 4.0 4.0  CL 101 100  CO2 27 27  GLUCOSE 121* 113*  BUN 9 11  CREATININE 0.85 0.77  CALCIUM 8.3* 8.8*   PT/INR No results for input(s): LABPROT, INR in the last 72 hours. ABG No results for input(s): PHART, HCO3 in the last 72 hours.  Invalid input(s): PCO2, PO2  Studies/Results: No results found.  Anti-infectives: Anti-infectives (From admission, onward)   Start     Dose/Rate Route Frequency Ordered Stop   11/25/17 1800  ceFAZolin (ANCEF) IVPB 2g/100 mL premix     2 g 200 mL/hr over 30 Minutes Intravenous Every 8 hours 11/25/17 1425 11/25/17 1923   11/25/17 0715  ceFAZolin (ANCEF) IVPB 2g/100 mL premix     2 g 200 mL/hr over 30 Minutes Intravenous On call to O.R. 11/25/17 0709 11/25/17 1010      Assessment/Plan: s/p Procedure(s): LAPAROSCOPIC  DISTAL PANCREATECTOMY AND SPLENECTOMY ERAS PATHWAY (N/A) Full liquid diet as tolerated. Ambulate/IS Thorazine for hiccups.  PCA custom dose.  Pt was on significant narcotics with pancreatitis.  Has only been off these for 3-4 weeks.  I have not changed this given the nausea/vomiting.   Leukocytosis expected for post splenectomy  Post splenectomy vaccines day of discharge.  May need scan if no clinical improvement and drain output remains low.  Recheck labs in AM.   LOS: 3 days    Andre Simmons 11/28/2017

## 2017-11-29 LAB — BASIC METABOLIC PANEL
Anion gap: 10 (ref 5–15)
BUN: 15 mg/dL (ref 6–20)
CHLORIDE: 96 mmol/L — AB (ref 98–111)
CO2: 29 mmol/L (ref 22–32)
Calcium: 8.5 mg/dL — ABNORMAL LOW (ref 8.9–10.3)
Creatinine, Ser: 0.78 mg/dL (ref 0.61–1.24)
GFR calc non Af Amer: >60 mL/min (ref >60)
Glucose, Bld: 91 mg/dL (ref 70–99)
POTASSIUM: 3.7 mmol/L (ref 3.5–5.1)
SODIUM: 135 mmol/L (ref 135–145)

## 2017-11-29 LAB — CBC
HEMATOCRIT: 34.7 % — AB (ref 39.0–52.0)
HEMOGLOBIN: 11.3 g/dL — AB (ref 13.0–17.0)
MCH: 31.3 pg (ref 26.0–34.0)
MCHC: 32.6 g/dL (ref 30.0–36.0)
MCV: 96.1 fL (ref 78.0–100.0)
Platelets: 444 10*3/uL — ABNORMAL HIGH (ref 150–400)
RBC: 3.61 MIL/uL — ABNORMAL LOW (ref 4.22–5.81)
RDW: 13.3 % (ref 11.5–15.5)
WBC: 18.7 10*3/uL — ABNORMAL HIGH (ref 4.0–10.5)

## 2017-11-29 NOTE — Progress Notes (Signed)
[facility]:  [phone] General Surgery Progress Note   LOS: 4 days  POD -  4 Days Post-Op  Chief Complaint: Pancreas tumor  Assessment and Plan: 1.  LAPAROSCOPIC DISTAL PANCREATECTOMY AND SPLENECTOMY - 11/25/2017 - [facility]  Path pending  Labs pending today  WBC - 27,200 - 11/28/2017  On full liquids - not much of an appetite.  He did walk the halls yesterday.  Needs to ambulate more.  2.  DVT prophylaxis - Lovenox   Active Problems:   Neuroendocrine tumor of pancreas  Subjective:  Doing okay.  Sore.  No nausea.  But not taking much PO.  Objective:   Vitals:   11/28/17 2311 11/29/17 0400  BP:  118/77  Pulse:  63  Resp: (!) 24 15  Temp:  98.1 F (36.7 C)  SpO2: 98% 96%     Intake/Output from previous day:  10/04 0701 - 10/05 0700 In: 2557.4 [P.O.:1440; I.V.:1117.4] Out: 720 [Urine:720]  Intake/Output this shift:  No intake/output data recorded.   Physical Exam:   General: WN M who is alert and oriented.    HEENT: Normal. Pupils equal. .   Lungs: Clear   Abdomen: Has some BS.   Wound: Clean. Drain - nothing recorded last 24 hours    Lab Results:    Recent Labs    11/27/17 0346 11/28/17 0631  WBC 27.4* 27.2*  HGB 12.8* 11.8*  HCT 39.4 35.5*  PLT 336 340    BMET   Recent Labs    11/27/17 0346 11/28/17 0631  NA 136 136  K 4.0 4.0  CL 101 100  CO2 27 27  GLUCOSE 121* 113*  BUN 9 11  CREATININE 0.85 0.77  CALCIUM 8.3* 8.8*    PT/INR  No results for input(s): LABPROT, INR in the last 72 hours.  ABG  No results for input(s): PHART, HCO3 in the last 72 hours.  Invalid input(s): PCO2, PO2   Studies/Results:  No results found.   Anti-infectives:   Anti-infectives (From admission, onward)   Start     Dose/Rate Route Frequency Ordered Stop   11/25/17 1800  ceFAZolin (ANCEF) IVPB 2g/100 mL premix     2 g 200 mL/hr over 30 Minutes Intravenous Every 8 hours 11/25/17 1425 11/25/17 1923   11/25/17 0715  ceFAZolin (ANCEF)  IVPB 2g/100 mL premix     2 g 200 mL/hr over 30 Minutes Intravenous On call to O.R. 11/25/17 0709 11/25/17 1010      Alphonsa Overall, MD, FACS Pager: [facility]: [phone] 11/29/2017

## 2017-11-29 NOTE — Progress Notes (Signed)
Wasted 2.5 ml of Dilaudid from PCA  Witnessed by Corky Crafts , RN.

## 2017-11-29 NOTE — Plan of Care (Signed)
  Problem: Skin Integrity: Goal: Demonstration of wound healing without infection will improve Outcome: Progressing   

## 2017-11-30 LAB — BASIC METABOLIC PANEL
Anion gap: 10 (ref 5–15)
BUN: 12 mg/dL (ref 6–20)
CO2: 31 mmol/L (ref 22–32)
CREATININE: 0.81 mg/dL (ref 0.61–1.24)
Calcium: 8.6 mg/dL — ABNORMAL LOW (ref 8.9–10.3)
Chloride: 95 mmol/L — ABNORMAL LOW (ref 98–111)
GFR calc Af Amer: >60 mL/min (ref >60)
GLUCOSE: 103 mg/dL — AB (ref 70–99)
Potassium: 3.3 mmol/L — ABNORMAL LOW (ref 3.5–5.1)
Sodium: 136 mmol/L (ref 135–145)

## 2017-11-30 LAB — CBC
HCT: 36 % — ABNORMAL LOW (ref 39.0–52.0)
Hemoglobin: 11.9 g/dL — ABNORMAL LOW (ref 13.0–17.0)
MCH: 31.5 pg (ref 26.0–34.0)
MCHC: 33.1 g/dL (ref 30.0–36.0)
MCV: 95.2 fL (ref 78.0–100.0)
PLATELETS: 507 10*3/uL — AB (ref 150–400)
RBC: 3.78 MIL/uL — ABNORMAL LOW (ref 4.22–5.81)
RDW: 13.3 % (ref 11.5–15.5)
WBC: 17.7 10*3/uL — AB (ref 4.0–10.5)

## 2017-11-30 MED ORDER — OXYCODONE HCL 5 MG/5ML PO SOLN
5.0000 mg | ORAL | Status: DC | PRN
  Administered 2017-11-30: 5 mg via ORAL
  Filled 2017-11-30: qty 5

## 2017-11-30 NOTE — Progress Notes (Signed)
[facility]:  [phone] General Surgery Progress Note   LOS: 5 days  POD -  5 Days Post-Op  Chief Complaint: Pancreas tumor  Assessment and Plan: 1.  LAPAROSCOPIC DISTAL PANCREATECTOMY AND SPLENECTOMY - 11/25/2017 - [facility]  Path pending  WBC - 17,700 - 11/30/2017  Increased bowel activity, though still a little nauseated.  Looks better than yesterday.  To increase to reg diet  2.  DVT prophylaxis - Lovenox   Active Problems:   Neuroendocrine tumor of pancreas  Subjective:  Feels better.  Passed flatus, abdomen less distended.  Still wants to used PCA - will add oral pain med.  Objective:   Vitals:   11/30/17 0357 11/30/17 0440  BP:  102/78  Pulse:  69  Resp: 16 18  Temp:  98 F (36.7 C)  SpO2: 98% 100%     Intake/Output from previous day:  10/05 0701 - 10/06 0700 In: 2749.2 [P.O.:1360; I.V.:1389.2] Out: 3900 [Urine:3900]  Intake/Output this shift:  No intake/output data recorded.   Physical Exam:   General: WN M who is alert and oriented.    HEENT: Normal. Pupils equal. .   Lungs: Clear.  IS = 1,800 cc   Abdomen: Has BS.  Abdomen softer than yesterday.   Wound: Clean. Drain - still nothing recorded last 24 hours    Lab Results:    Recent Labs    11/29/17 0650 11/30/17 0515  WBC 18.7* 17.7*  HGB 11.3* 11.9*  HCT 34.7* 36.0*  PLT 444* 507*    BMET   Recent Labs    11/29/17 0650 11/30/17 0515  NA 135 136  K 3.7 3.3*  CL 96* 95*  CO2 29 31  GLUCOSE 91 103*  BUN 15 12  CREATININE 0.78 0.81  CALCIUM 8.5* 8.6*    PT/INR  No results for input(s): LABPROT, INR in the last 72 hours.  ABG  No results for input(s): PHART, HCO3 in the last 72 hours.  Invalid input(s): PCO2, PO2   Studies/Results:  No results found.   Anti-infectives:   Anti-infectives (From admission, onward)   Start     Dose/Rate Route Frequency Ordered Stop   11/25/17 1800  ceFAZolin (ANCEF) IVPB 2g/100 mL premix     2 g 200 mL/hr over 30 Minutes  Intravenous Every 8 hours 11/25/17 1425 11/25/17 1923   11/25/17 0715  ceFAZolin (ANCEF) IVPB 2g/100 mL premix     2 g 200 mL/hr over 30 Minutes Intravenous On call to O.R. 11/25/17 0709 11/25/17 1010      Alphonsa Overall, MD, FACS Pager: [facility] Office: [phone] 11/30/2017

## 2017-12-01 MED ORDER — HYDROMORPHONE HCL 1 MG/ML IJ SOLN
0.5000 mg | INTRAMUSCULAR | Status: DC | PRN
  Administered 2017-12-02: 1 mg via INTRAVENOUS
  Filled 2017-12-01: qty 1

## 2017-12-01 MED ORDER — HYDROMORPHONE HCL 2 MG PO TABS
2.0000 mg | ORAL_TABLET | ORAL | Status: DC | PRN
  Administered 2017-12-01: 2 mg via ORAL
  Administered 2017-12-01: 4 mg via ORAL
  Administered 2017-12-01: 2 mg via ORAL
  Administered 2017-12-01 – 2017-12-02 (×5): 4 mg via ORAL
  Filled 2017-12-01 (×3): qty 2
  Filled 2017-12-01: qty 1
  Filled 2017-12-01 (×3): qty 2
  Filled 2017-12-01: qty 1

## 2017-12-01 MED ORDER — HYDROMORPHONE HCL 2 MG PO TABS: 2.0000 mg | ORAL_TABLET | ORAL | Status: DC | PRN

## 2017-12-01 MED ORDER — CELECOXIB 200 MG PO CAPS
200.0000 mg | ORAL_CAPSULE | Freq: Every day | ORAL | Status: DC
  Administered 2017-12-01 – 2017-12-02 (×2): 200 mg via ORAL
  Filled 2017-12-01 (×2): qty 1

## 2017-12-01 NOTE — Progress Notes (Signed)
PCA stopped 14 ml wasted in sharps wittnesed by Drucilla Chalet, RN

## 2017-12-01 NOTE — Progress Notes (Signed)
6 Days Post-Op   Subjective/Chief Complaint: No fever, WBCs coming down.  Tolerating diet better.  Still having quite a lot of pain.     Objective: Vital signs in last 24 hours: Temp:  [98 F (36.7 C)-98.5 F (36.9 C)] 98.3 F (36.8 C) (10/07 1316) Pulse Rate:  [68-75] 73 (10/07 1316) Resp:  [13-21] 21 (10/07 1316) BP: (109-128)/(71-85) 128/79 (10/07 1316) SpO2:  [95 %-100 %] 99 % (10/07 1316) Last BM Date: 11/25/17  Intake/Output from previous day: 10/06 0701 - 10/07 0700 In: 2437.8 [P.O.:1240; I.V.:1197.8] Out: 935 [Urine:850; Drains:85] Intake/Output this shift: Total I/O In: 220 [P.O.:220] Out: 2 [Urine:1; Stool:1]  General appearance: alert, cooperative and mild distress Resp: breathing comfortably GI: soft, nondistended, approp tender.  drain serosang Extremities: extremities normal, atraumatic, no cyanosis or edema  Lab Results:  Recent Labs    11/29/17 0650 11/30/17 0515  WBC 18.7* 17.7*  HGB 11.3* 11.9*  HCT 34.7* 36.0*  PLT 444* 507*   BMET Recent Labs    11/29/17 0650 11/30/17 0515  NA 135 136  K 3.7 3.3*  CL 96* 95*  CO2 29 31  GLUCOSE 91 103*  BUN 15 12  CREATININE 0.78 0.81  CALCIUM 8.5* 8.6*   PT/INR No results for input(s): LABPROT, INR in the last 72 hours. ABG No results for input(s): PHART, HCO3 in the last 72 hours.  Invalid input(s): PCO2, PO2  Studies/Results: No results found.  Anti-infectives: Anti-infectives (From admission, onward)   Start     Dose/Rate Route Frequency Ordered Stop   11/25/17 1800  ceFAZolin (ANCEF) IVPB 2g/100 mL premix     2 g 200 mL/hr over 30 Minutes Intravenous Every 8 hours 11/25/17 1425 11/25/17 1923   11/25/17 0715  ceFAZolin (ANCEF) IVPB 2g/100 mL premix     2 g 200 mL/hr over 30 Minutes Intravenous On call to O.R. 11/25/17 0709 11/25/17 1010      Assessment/Plan: s/p Procedure(s): LAPAROSCOPIC DISTAL PANCREATECTOMY AND SPLENECTOMY ERAS PATHWAY (N/A)  Post splenectomy vaccines day of  discharge.  Regular diet D/c IV fluids D/c pca. Oral dilaudid.  Pt has needed significant pain medication last admission and this admission.  PRN IV dilaudid for breakthrough.   Will also add celebrex.   LOS: 6 days    Andre Simmons 12/01/2017

## 2017-12-01 NOTE — Care Management Important Message (Signed)
Important Message  Patient Details  Name: Andre Simmons MRN: [id number] Date of Birth: December 07, 1975   Medicare Important Message Given:  Yes    Orbie Pyo 12/01/2017, 9:54 AM

## 2017-12-01 NOTE — Progress Notes (Signed)
Wasted about 1.68 mL of dilaudid from PCA injection in sink which was witnessed by [employer], Therapist, sports.

## 2017-12-02 LAB — BASIC METABOLIC PANEL
ANION GAP: 8 (ref 5–15)
BUN: 11 mg/dL (ref 6–20)
CALCIUM: 8.7 mg/dL — AB (ref 8.9–10.3)
CO2: 29 mmol/L (ref 22–32)
Chloride: 99 mmol/L (ref 98–111)
Creatinine, Ser: 0.81 mg/dL (ref 0.61–1.24)
GFR calc non Af Amer: >60 mL/min (ref >60)
GLUCOSE: 107 mg/dL — AB (ref 70–99)
POTASSIUM: 4.1 mmol/L (ref 3.5–5.1)
Sodium: 136 mmol/L (ref 135–145)

## 2017-12-02 LAB — CBC
HEMATOCRIT: 34.6 % — AB (ref 39.0–52.0)
HEMOGLOBIN: 11.5 g/dL — AB (ref 13.0–17.0)
MCH: 31.5 pg (ref 26.0–34.0)
MCHC: 33.2 g/dL (ref 30.0–36.0)
MCV: 94.8 fL (ref 80.0–100.0)
Platelets: 619 10*3/uL — ABNORMAL HIGH (ref 150–400)
RBC: 3.65 MIL/uL — AB (ref 4.22–5.81)
RDW: 13.4 % (ref 11.5–15.5)
WBC: 14.1 10*3/uL — AB (ref 4.0–10.5)

## 2017-12-02 MED ORDER — SENNOSIDES-DOCUSATE SODIUM 8.6-50 MG PO TABS: 1.0000 | ORAL_TABLET | Freq: Every evening | ORAL | 0 refills | Status: DC | PRN

## 2017-12-02 MED ORDER — HYDROMORPHONE HCL 2 MG PO TABS: 2.0000 mg | ORAL_TABLET | ORAL | 0 refills | Status: DC | PRN

## 2017-12-02 MED ORDER — GABAPENTIN 300 MG PO CAPS: 300.0000 mg | ORAL_CAPSULE | Freq: Two times a day (BID) | ORAL | 0 refills | Status: DC

## 2017-12-02 MED ORDER — METHOCARBAMOL 500 MG PO TABS: 500.0000 mg | ORAL_TABLET | Freq: Four times a day (QID) | ORAL | 1 refills | Status: DC | PRN

## 2017-12-02 MED ORDER — CELECOXIB 200 MG PO CAPS: 200.0000 mg | ORAL_CAPSULE | Freq: Every day | ORAL | 1 refills | Status: DC

## 2017-12-02 NOTE — Discharge Summary (Signed)
Physician Discharge Summary  Patient ID: Andre Simmons MRN: [id number] DOB/AGE: 1975/09/30 42 y.o.  Admit date: 11/25/2017 Discharge date: 12/02/2017  Admission Diagnoses: Patient Active Problem List   Diagnosis Date Noted  . Neuroendocrine tumor of pancreas 11/25/2017  . Acute pancreatitis 09/26/2017  . Chronic back pain   . Nausea and vomiting in adult patient   . Abnormal MRI 09/18/2017  . Pancreatic lesion 09/18/2017    Discharge Diagnoses:  Solid pseudopapillary tumor of pancreas Recent pancreatitis Leukocytosis S/p splenectomy  Discharged Condition: stable  Hospital Course:  Pt was admitted to the floor following hand assisted lap distal pancreatectomy with splenectomy for a presumed neuroendocrine tumor 11/25/2017.  He had significant pain control issues post op which was unsurprising.  He had post EUS/bx pancreatitis around 2 months previous to surgery that involved 11-12 day hospital stay due to severe pain.  He was discharged on oxycontin.  He was off that prior to surgery but was placed on full dose dilaudid PCA post op.  On POD 1 he was in agonizing pain, so the PCA doses were increased.  This was quite helpful.  Foley was removed on POD 1 and was reinserted for urinary retention.  It was removed successfully several days later.    He had fevers for the first few days while his pain control was marginal, but this improved with pulmonary toilet rapidly.  His white count was quite elevated presumably (27) due to his splenectomy, but it started to go down as he started to improve.  He had a difficult time tolerated diet advance as well due to mild bloating and nausea.  He was gradually able to eat a bit better and have a little more energy.    He tolerated conversion to oral dilaudid once his pain was slightly better controlled on the PCA.  His drain output remained thin.  The drain output nearly stopped as well.  He was ambulatory.  He was discharged on POD 7 in stable  condition with oral dilaudid.    Consults: None  Significant Diagnostic Studies: labs: WBCs down to 14 prior to discharge from high of 27.    Treatments: surgery: see above  Discharge Exam: Blood pressure 121/77, pulse 63, temperature 98.4 F (36.9 C), temperature source Oral, resp. rate 18, height 6\' 3"  (1.905 m), weight 101.7 kg, SpO2 95 %. General appearance: alert, cooperative and no distress Resp: breathing comfortably Cardio: regular rate and rhythm GI: soft, non distended, minimally tender.  drain serous.   Extremities: extremities normal, atraumatic, no cyanosis or edema  Disposition: Discharge disposition: 01-Home or Self Care       Discharge Instructions    Call MD for:  difficulty breathing, headache or visual disturbances   Complete by:  As directed    Call MD for:  persistant nausea and vomiting   Complete by:  As directed    Call MD for:  redness, tenderness, or signs of infection (pain, swelling, redness, odor or green/yellow discharge around incision site)   Complete by:  As directed    Call MD for:  severe uncontrolled pain   Complete by:  As directed    Call MD for:  temperature >100.4   Complete by:  As directed    Diet - low sodium heart healthy   Complete by:  As directed    Increase activity slowly   Complete by:  As directed      Allergies as of 12/02/2017   No Known Allergies  Medication List    TAKE these medications   bisacodyl 5 MG EC tablet Commonly known as:  DULCOLAX Take 1 tablet (5 mg total) by mouth daily as needed for moderate constipation.   celecoxib 200 MG capsule Commonly known as:  CELEBREX Take 1 capsule (200 mg total) by mouth daily. Start taking on:  12/03/2017   famotidine 20 MG tablet Commonly known as:  PEPCID Take 1 tablet (20 mg total) by mouth 2 (two) times daily.   gabapentin 300 MG capsule Commonly known as:  NEURONTIN Take 1 capsule (300 mg total) by mouth 2 (two) times daily.   HYDROmorphone 2 MG  tablet Commonly known as:  DILAUDID Take 1-2 tablets (2-4 mg total) by mouth every 3 (three) hours as needed for moderate pain or severe pain.   methocarbamol 500 MG tablet Commonly known as:  ROBAXIN Take 1 tablet (500 mg total) by mouth every 6 (six) hours as needed for muscle spasms.   omeprazole 40 MG capsule Commonly known as:  PRILOSEC Take 40 mg by mouth 2 (two) times daily.   promethazine 12.5 MG tablet Commonly known as:  PHENERGAN Take 1 tablet (12.5 mg total) by mouth every 8 (eight) hours as needed for nausea or vomiting.   senna-docusate 8.6-50 MG tablet Commonly known as:  Senokot-S Take 1 tablet by mouth at bedtime as needed for mild constipation.   sertraline 50 MG tablet Commonly known as:  ZOLOFT Take 50 mg by mouth at bedtime.      Follow-up Information    Stark Klein, MD In 2 weeks.   Specialty:  General Surgery Contact information: [address] [place] [address] [phone]           Signed: Stark Klein 12/02/2017, 11:22 AM

## 2017-12-02 NOTE — Progress Notes (Signed)
Discharged instructions reviewed again with Pt and his family.  Pt discharged home with mother.  Pt to leave via w/c with family.

## 2017-12-26 DIAGNOSIS — C252 Malignant neoplasm of tail of pancreas: Secondary | ICD-10-CM | POA: Diagnosis not present

## 2018-01-16 DIAGNOSIS — Z6828 Body mass index (BMI) 28.0-28.9, adult: Secondary | ICD-10-CM | POA: Diagnosis not present

## 2018-01-16 DIAGNOSIS — E663 Overweight: Secondary | ICD-10-CM | POA: Diagnosis not present

## 2018-01-16 DIAGNOSIS — F321 Major depressive disorder, single episode, moderate: Secondary | ICD-10-CM | POA: Diagnosis not present

## 2018-01-16 DIAGNOSIS — R062 Wheezing: Secondary | ICD-10-CM | POA: Diagnosis not present

## 2018-03-15 ENCOUNTER — Other Ambulatory Visit: Payer: Self-pay

## 2018-03-15 ENCOUNTER — Encounter (HOSPITAL_COMMUNITY): Payer: Self-pay | Admitting: *Deleted

## 2018-03-15 ENCOUNTER — Emergency Department (HOSPITAL_COMMUNITY)
Admission: EM | Admit: 2018-03-15 | Discharge: 2018-03-15 | Disposition: A | Discharge status: Home / Self Care | Payer: Medicare Other | Attending: Emergency Medicine | Admitting: Emergency Medicine

## 2018-03-15 ENCOUNTER — Emergency Department (HOSPITAL_COMMUNITY): Payer: Medicare Other

## 2018-03-15 DIAGNOSIS — N399 Disorder of urinary system, unspecified: Secondary | ICD-10-CM | POA: Insufficient documentation

## 2018-03-15 DIAGNOSIS — Z79899 Other long term (current) drug therapy: Secondary | ICD-10-CM | POA: Insufficient documentation

## 2018-03-15 DIAGNOSIS — M545 Low back pain: Secondary | ICD-10-CM | POA: Diagnosis not present

## 2018-03-15 DIAGNOSIS — Z87891 Personal history of nicotine dependence: Secondary | ICD-10-CM | POA: Diagnosis not present

## 2018-03-15 DIAGNOSIS — M5441 Lumbago with sciatica, right side: Secondary | ICD-10-CM | POA: Diagnosis not present

## 2018-03-15 MED ORDER — HYDROMORPHONE HCL 1 MG/ML IJ SOLN
1.0000 mg | Freq: Once | INTRAMUSCULAR | Status: AC
  Administered 2018-03-15: 1 mg via INTRAVENOUS
  Filled 2018-03-15: qty 1

## 2018-03-15 MED ORDER — OXYCODONE-ACETAMINOPHEN 5-325 MG PO TABS: 2.0000 | ORAL_TABLET | ORAL | 0 refills | Status: DC | PRN

## 2018-03-15 MED ORDER — METHYLPREDNISOLONE 4 MG PO TBPK: ORAL_TABLET | ORAL | 0 refills | Status: DC

## 2018-03-15 MED ORDER — METHOCARBAMOL 500 MG PO TABS
500.0000 mg | ORAL_TABLET | Freq: Once | ORAL | Status: AC
  Administered 2018-03-15: 500 mg via ORAL
  Filled 2018-03-15: qty 1

## 2018-03-15 MED ORDER — METHOCARBAMOL 500 MG PO TABS: 500.0000 mg | ORAL_TABLET | Freq: Two times a day (BID) | ORAL | 0 refills | Status: DC | PRN

## 2018-03-15 NOTE — ED Notes (Signed)
MRI notified.

## 2018-03-15 NOTE — ED Triage Notes (Signed)
Pt c/o LBP with radiation to RLE x 2 weeks.  Hx of discectomy x 2 to L4-S1.

## 2018-03-15 NOTE — ED Provider Notes (Signed)
[facility] DEPT Provider Note   CSN: [id number] Arrival date & time: 03/15/18  0557     History   Chief Complaint Chief Complaint  Patient presents with  . Back Pain    HPI Andre Simmons is a 43 y.o. male.  The history is provided by the patient and medical records. No language interpreter was used.   Andre Simmons is a 43 y.o. male who presents to the Emergency Department complaining of back pain.  Patient reports that he has a history of low back pain with prior discectomies in 2004 and 2005 by Dr. Annette Stable.  Since his surgeries, he will intermittently have flares of low back pain lasting about a week that will resolve with time and home care.  About 2-1/2 weeks ago, he developed low back pain consistent with his typical flares, although his pain has not improved.  He had an abdominal surgery back in October and had leftover gabapentin and PO Dilaudid which he has been taking with some improvement.  Last night, patient states that he rolled over and had an acute onset of low back pain and felt a pop.  He had a numb sensation down his right leg which has improved somewhat.  He denies any other numbness.  Feels as if his pain is radiating down his right leg stopping about the calf.  He has never had any radicular symptoms or involvement in his leg before.  When he rolled over and felt the acute onset of pain, he had an episode of bladder incontinence.  He reports that he did not realize it happened, but realized he had peed all of himself.  He has not had any further bowel or bladder incontinence.  He has not used the restroom since this incident.  Denies any saddle anesthesia.  No fever or chills.   Past Medical History:  Diagnosis Date  . Arthritis   . Chronic back pain   . Depression   . GERD (gastroesophageal reflux disease)   . Knee pain   . Pancreatic tumor     Patient Active Problem List   Diagnosis Date Noted  . Neuroendocrine tumor of  pancreas 11/25/2017  . Acute pancreatitis 09/26/2017  . Chronic back pain   . Nausea and vomiting in adult patient   . Abnormal MRI 09/18/2017  . Pancreatic lesion 09/18/2017    Past Surgical History:  Procedure Laterality Date  . BACK SURGERY     L4-L5, S1 micro discectomy  . EUS N/A 09/25/2017   Procedure: UPPER ENDOSCOPIC ULTRASOUND (EUS) LINEAR;  Surgeon: Milus Banister, MD;  Location: [facility] ENDOSCOPY;  Service: Endoscopy;  Laterality: N/A;  Radial and Linear  . FINE NEEDLE ASPIRATION  09/25/2017   Procedure: FINE NEEDLE ASPIRATION (FNA) RADIAL;  Surgeon: Milus Banister, MD;  Location: [facility] END[facility];  Service: Endoscopy;;  . KNEE SURGERY Left    x 20 surgeries  . LAPAROSCOPIC DISTAL PANCREATECTOMY  11/26/2017   Diagnostic laparoscopy, laparoscopic hand-assisted distal   . MOUTH SURGERY     as a child, unsure if wisdom teeth or tonsils  . PANCREATECTOMY N/A 11/25/2017   Procedure: LAPAROSCOPIC DISTAL PANCREATECTOMY AND SPLENECTOMY ERAS PATHWAY;  Surgeon: Stark Klein, MD;  Location: [facility];  Service: General;  Laterality: N/A;        Home Medications    Prior to Admission medications   Medication Sig Start Date End Date Taking? Authorizing Provider  gabapentin (NEURONTIN) 300 MG capsule Take 1 capsule (300 mg  total) by mouth 2 (two) times daily. Patient taking differently: Take 300 mg by mouth daily as needed (for pain).  12/02/17  Yes Stark Klein, MD  HYDROmorphone (DILAUDID) 2 MG tablet Take 1-2 tablets (2-4 mg total) by mouth every 3 (three) hours as needed for moderate pain or severe pain. 12/02/17  Yes Stark Klein, MD  omeprazole (PRILOSEC) 40 MG capsule Take 40 mg by mouth 2 (two) times daily.    Yes [provider]  sertraline (ZOLOFT) 50 MG tablet Take 50 mg by mouth 2 (two) times daily.    Yes [provider]  methocarbamol (ROBAXIN) 500 MG tablet Take 1 tablet (500 mg total) by mouth 2 (two) times daily as needed for muscle spasms. 03/15/18   Serenidy Waltz,  Ozella Almond, PA-C  methylPREDNISolone (MEDROL DOSEPAK) 4 MG TBPK tablet Take as directed on package. 03/15/18   Yolandra Habig, Ozella Almond, PA-C  oxyCODONE-acetaminophen (PERCOCET/ROXICET) 5-325 MG tablet Take 2 tablets by mouth every 4 (four) hours as needed for severe pain. 03/15/18   Jarel Cuadra, Ozella Almond, PA-C    Family History Family History  Problem Relation Age of Onset  . Thyroid cancer Mother   . Other Father        MVA at age 55  . Esophageal cancer Maternal Grandmother        smoker  . Breast cancer Maternal Grandmother   . Esophageal cancer Paternal Grandmother   . Colon cancer Neg Hx   . Rectal cancer Neg Hx     Social History Social History   Tobacco Use  . Smoking status: Former Research scientist (life sciences)  . Smokeless tobacco: Never Used  . Tobacco comment: Quit 14-15 years ago (as of 2019)  Substance Use Topics  . Alcohol use: No  . Drug use: No     Allergies   Patient has no known allergies.   Review of Systems Review of Systems  Genitourinary:       + Bladder incontinence  Musculoskeletal: Positive for back pain.  Neurological: Positive for numbness.  All other systems reviewed and are negative.    Physical Exam Updated Vital Signs BP 120/85   Pulse (!) 57   Temp 97.7 F (36.5 C) (Oral)   Resp 17   Ht 6\' 3"  (1.905 m)   Wt 102.1 kg   SpO2 94%   BMI 28.12 kg/m   Physical Exam Vitals signs and nursing note reviewed.  Constitutional:      Appearance: He is well-developed.  Neck:     Comments: No midline or paraspinal tenderness. Full ROM without pain. Cardiovascular:     Rate and Rhythm: Normal rate and regular rhythm.     Heart sounds: Normal heart sounds.  Pulmonary:     Effort: Pulmonary effort is normal. No respiratory distress.     Breath sounds: Normal breath sounds.  Abdominal:     General: Bowel sounds are normal. There is no distension.     Palpations: Abdomen is soft.     Tenderness: There is no abdominal tenderness.  Musculoskeletal:        Back:     Comments: Tenderness to palpation as depicted in image. 4/5 muscle strength to RLE which very likely could be 2/2 pain. 5/5 strength to LLE.  Skin:    General: Skin is warm and dry.     Findings: No erythema or rash.  Neurological:     Mental Status: He is alert and oriented to person, place, and time.     Deep Tendon  Reflexes: Reflexes are normal and symmetric.     Comments: Bilateral lower extremities neurovascularly intact.      ED Treatments / Results  Labs (all labs ordered are listed, but only abnormal results are displayed) Labs Reviewed - No data to display  EKG None  Radiology Mr Lumbar Spine Wo Contrast  Result Date: 03/15/2018 CLINICAL DATA:  43 year old male with progressed chronic low back pain. Prior surgery. Right leg pain numbness and tingling. Urinary incontinence. EXAM: MRI LUMBAR SPINE WITHOUT CONTRAST TECHNIQUE: Multiplanar, multisequence MR imaging of the lumbar spine was performed. No intravenous contrast was administered. COMPARISON:  CT Abdomen and Pelvis 11/03/2017 and earlier. Lumbar MRI 09/27/2004. FINDINGS: Segmentation:  Normal. Alignment: Lordosis has not significantly changed since 2006. Chronic mild retrolisthesis of L5 on S1. Vertebrae: No marrow edema or evidence of acute osseous abnormality. Visualized bone marrow signal is within normal limits. Intact visible sacrum and SI joints. Conus medullaris and cauda equina: Conus extends to the L1 level. No lower spinal cord or conus signal abnormality. Paraspinal and other soft tissues: Negative aside from mild chronic postoperative soft tissue changes overlying the right L4-L5 level. Urinary bladder not included. Disc levels: Visible lower thoracic spine through L2-L3 are normal. L3-L4: Mild disc desiccation. Circumferential disc bulge. Small posterior central annular fissure and protrusion. No significant stenosis. L4-L5: Circumferential disc bulge with moderate to large disc extrusion into the right  lateral recess (series 4, image 5 and series 6, image 31. Previous right laminectomy at this level. Mild facet hypertrophy. Severe right lateral recess stenosis (descending right L5 nerve level). Mild overall spinal stenosis. No L4 foraminal involvement. L5-S1: Chronic disc space loss and circumferential disc osteophyte complex eccentric to the right. Chronic postoperative changes to the right lamina. Chronic but increased effacement of the right lateral recess with some superimposed postoperative architectural distortion at the level of the right S1 nerve. Overall mild right S1 neural impingement. No spinal stenosis. Borderline to mild right L5 foraminal stenosis. IMPRESSION: 1. Symptomatic level is favored to be L4-L5 where a large rightward disc extrusion results in severe right lateral recess and mild spinal stenosis. Query right L5 radiculitis. 2. Chronic postoperative changes on the right at L4-L5 and L5-S1. Chronic disc osteophyte complex with mild right lateral recess architectural distortion and stenosis at the latter. Electronically Signed   By: Genevie Ann M.D.   On: 03/15/2018 13:59    Procedures Procedures (including critical care time)  Medications Ordered in ED Medications  HYDROmorphone (DILAUDID) injection 1 mg (has no administration in time range)  HYDROmorphone (DILAUDID) injection 1 mg (1 mg Intravenous Given 03/15/18 0822)  methocarbamol (ROBAXIN) tablet 500 mg (500 mg Oral Given 03/15/18 0821)  HYDROmorphone (DILAUDID) injection 1 mg (1 mg Intravenous Given 03/15/18 1122)     Initial Impression / Assessment and Plan / ED Course  I have reviewed the triage vital signs and the nursing notes.  Pertinent labs & imaging results that were available during my care of the patient were reviewed by me and considered in my medical decision making (see chart for details).    Andre Simmons is a 43 y.o. male with hx of prior L-spine surgeries in 2004 & 2005 by Dr. Annette Stable who presents to ER for  acute worsening of his low back pain, mostly right-sided. Last night, he rolled oddly and felt a pop with acute onset of severe pain with radiation down his right leg. He did have an episode of urinary incontinence at this time. He has urinated  in ED as usual with normal post-void residual at 24. Feel this episode was likely 2/2 pain, however given his history and limited exam due to pain, we discussed risks/benefits of MRI in ED today vs. Outpatient neurosurgery follow up / strict return precautions where he can get imaging done as outpatient. Patient quite concerned with events and would like to proceed with MRI in ED today. I feel this is reasonable. MRI obtained showing large rightward disc extrusion with right lateral recess and mild spinal stenosis at L4-L5. Imaging reviewed with attending, Dr. Eulis Foster. Patient informed of results. Evaluation does not show pathology that would require ongoing emergent intervention or inpatient treatment. Will start on prednisone, short course pain medication and have him call neurosurgery in the am to schedule an appointment for further care. Reasons to return to ER were discussed. All questions answered.   Patient discussed with Dr. Eulis Foster who agrees with treatment plan.   Final Clinical Impressions(s) / ED Diagnoses   Final diagnoses:  Acute right-sided low back pain with right-sided sciatica    ED Discharge Orders         Ordered    methylPREDNISolone (MEDROL DOSEPAK) 4 MG TBPK tablet     03/15/18 1433    oxyCODONE-acetaminophen (PERCOCET/ROXICET) 5-325 MG tablet  Every 4 hours PRN     03/15/18 1433    methocarbamol (ROBAXIN) 500 MG tablet  2 times daily PRN     03/15/18 1433           Dealie Koelzer, Ozella Almond, PA-C 03/15/18 1443    Daleen Bo, MD 03/15/18 [phone]

## 2018-03-15 NOTE — ED Notes (Signed)
Patient transported to MRI 

## 2018-03-15 NOTE — Discharge Instructions (Signed)
It was my pleasure taking care of you today!   Call your neurosurgeon to schedule a follow up appointment.   Take steroid dose pack as directed to help with the inflammation. Robaxin is your muscle relaxer to take as needed. Percocet as needed for severe pain. In addition to this, use ice and/or heat for additional pain relief.  COLD THERAPY DIRECTIONS:  Ice or gel packs can be used to reduce both pain and swelling. Ice is the most helpful within the first 24 to 48 hours after an injury or flareup from overusing a muscle or joint.  Ice is effective, has very few side effects, and is safe for most people to use.    Return to the ED for worsening back pain, fever, worsening weakness or numbness of either leg, or if you develop either (1) an inability to urinate or have bowel movements, or (2) have another episode where you lose your ability to control your bathroom functions (if you start having "accidents"), or if you develop other new symptoms that concern you.

## 2018-03-15 NOTE — ED Notes (Signed)
Pt stated "I took dilaudid 2 mg around 1900 and a gabapentin 300 mg.  I had the dilaudid from a previous surgery I had about 2 months ago."

## 2018-03-19 DIAGNOSIS — M5416 Radiculopathy, lumbar region: Secondary | ICD-10-CM | POA: Diagnosis not present

## 2018-03-26 DIAGNOSIS — M5416 Radiculopathy, lumbar region: Secondary | ICD-10-CM | POA: Diagnosis not present

## 2018-03-26 DIAGNOSIS — Z9889 Other specified postprocedural states: Secondary | ICD-10-CM | POA: Diagnosis not present

## 2018-03-26 DIAGNOSIS — M5116 Intervertebral disc disorders with radiculopathy, lumbar region: Secondary | ICD-10-CM | POA: Diagnosis not present

## 2018-03-28 HISTORY — PX: BACK SURGERY: SHX140

## 2018-06-25 ENCOUNTER — Inpatient Hospital Stay (HOSPITAL_COMMUNITY)
Admission: EM | Admit: 2018-06-25 | Discharge: 2018-06-30 | DRG: 440 | Disposition: A | Discharge status: Home / Self Care | Payer: Medicare Other | Attending: Internal Medicine | Admitting: Internal Medicine

## 2018-06-25 ENCOUNTER — Emergency Department (HOSPITAL_COMMUNITY): Payer: Medicare Other

## 2018-06-25 ENCOUNTER — Other Ambulatory Visit: Payer: Self-pay

## 2018-06-25 ENCOUNTER — Encounter (HOSPITAL_COMMUNITY): Payer: Self-pay

## 2018-06-25 DIAGNOSIS — Z20828 Contact with and (suspected) exposure to other viral communicable diseases: Secondary | ICD-10-CM | POA: Diagnosis not present

## 2018-06-25 DIAGNOSIS — Z79899 Other long term (current) drug therapy: Secondary | ICD-10-CM

## 2018-06-25 DIAGNOSIS — R933 Abnormal findings on diagnostic imaging of other parts of digestive tract: Secondary | ICD-10-CM

## 2018-06-25 DIAGNOSIS — K862 Cyst of pancreas: Secondary | ICD-10-CM | POA: Diagnosis not present

## 2018-06-25 DIAGNOSIS — K760 Fatty (change of) liver, not elsewhere classified: Secondary | ICD-10-CM | POA: Diagnosis not present

## 2018-06-25 DIAGNOSIS — Z90411 Acquired partial absence of pancreas: Secondary | ICD-10-CM | POA: Diagnosis not present

## 2018-06-25 DIAGNOSIS — M549 Dorsalgia, unspecified: Secondary | ICD-10-CM | POA: Diagnosis not present

## 2018-06-25 DIAGNOSIS — R112 Nausea with vomiting, unspecified: Secondary | ICD-10-CM | POA: Diagnosis not present

## 2018-06-25 DIAGNOSIS — Z800 Family history of malignant neoplasm of digestive organs: Secondary | ICD-10-CM

## 2018-06-25 DIAGNOSIS — R1110 Vomiting, unspecified: Secondary | ICD-10-CM | POA: Diagnosis present

## 2018-06-25 DIAGNOSIS — G8929 Other chronic pain: Secondary | ICD-10-CM | POA: Diagnosis not present

## 2018-06-25 DIAGNOSIS — R110 Nausea: Secondary | ICD-10-CM | POA: Diagnosis not present

## 2018-06-25 DIAGNOSIS — K589 Irritable bowel syndrome without diarrhea: Secondary | ICD-10-CM | POA: Diagnosis not present

## 2018-06-25 DIAGNOSIS — R1013 Epigastric pain: Secondary | ICD-10-CM

## 2018-06-25 DIAGNOSIS — K219 Gastro-esophageal reflux disease without esophagitis: Secondary | ICD-10-CM | POA: Diagnosis not present

## 2018-06-25 DIAGNOSIS — D3A8 Other benign neuroendocrine tumors: Secondary | ICD-10-CM | POA: Diagnosis not present

## 2018-06-25 DIAGNOSIS — M1990 Unspecified osteoarthritis, unspecified site: Secondary | ICD-10-CM | POA: Diagnosis present

## 2018-06-25 DIAGNOSIS — Z9081 Acquired absence of spleen: Secondary | ICD-10-CM

## 2018-06-25 DIAGNOSIS — R197 Diarrhea, unspecified: Secondary | ICD-10-CM | POA: Diagnosis not present

## 2018-06-25 DIAGNOSIS — Z808 Family history of malignant neoplasm of other organs or systems: Secondary | ICD-10-CM

## 2018-06-25 DIAGNOSIS — K869 Disease of pancreas, unspecified: Secondary | ICD-10-CM | POA: Diagnosis present

## 2018-06-25 DIAGNOSIS — R600 Localized edema: Secondary | ICD-10-CM | POA: Diagnosis not present

## 2018-06-25 DIAGNOSIS — Z87891 Personal history of nicotine dependence: Secondary | ICD-10-CM

## 2018-06-25 DIAGNOSIS — R9389 Abnormal findings on diagnostic imaging of other specified body structures: Secondary | ICD-10-CM | POA: Diagnosis not present

## 2018-06-25 DIAGNOSIS — Z9189 Other specified personal risk factors, not elsewhere classified: Secondary | ICD-10-CM

## 2018-06-25 DIAGNOSIS — R109 Unspecified abdominal pain: Secondary | ICD-10-CM | POA: Diagnosis present

## 2018-06-25 DIAGNOSIS — K859 Acute pancreatitis without necrosis or infection, unspecified: Principal | ICD-10-CM | POA: Diagnosis present

## 2018-06-25 DIAGNOSIS — Z8507 Personal history of malignant neoplasm of pancreas: Secondary | ICD-10-CM

## 2018-06-25 LAB — CBC WITH DIFFERENTIAL/PLATELET
Abs Immature Granulocytes: 0.05 10*3/uL (ref 0.00–0.07)
Basophils Absolute: 0.1 10*3/uL (ref 0.0–0.1)
Basophils Relative: 1 %
Eosinophils Absolute: 0.3 10*3/uL (ref 0.0–0.5)
Eosinophils Relative: 2 %
HCT: 42.7 % (ref 39.0–52.0)
Hemoglobin: 14.4 g/dL (ref 13.0–17.0)
Immature Granulocytes: 0 %
Lymphocytes Relative: 30 %
Lymphs Abs: 4 10*3/uL (ref 0.7–4.0)
MCH: 32 pg (ref 26.0–34.0)
MCHC: 33.7 g/dL (ref 30.0–36.0)
MCV: 94.9 fL (ref 80.0–100.0)
Monocytes Absolute: 0.9 10*3/uL (ref 0.1–1.0)
Monocytes Relative: 7 %
Neutro Abs: 8 10*3/uL — ABNORMAL HIGH (ref 1.7–7.7)
Neutrophils Relative %: 60 %
Platelets: 523 10*3/uL — ABNORMAL HIGH (ref 150–400)
RBC: 4.5 MIL/uL (ref 4.22–5.81)
RDW: 13.5 % (ref 11.5–15.5)
WBC: 13.4 10*3/uL — ABNORMAL HIGH (ref 4.0–10.5)
nRBC: 0 % (ref 0.0–0.2)

## 2018-06-25 LAB — COMPREHENSIVE METABOLIC PANEL
ALT: 44 U/L (ref 0–44)
AST: 21 U/L (ref 15–41)
Albumin: 4.5 g/dL (ref 3.5–5.0)
Alkaline Phosphatase: 86 U/L (ref 38–126)
Anion gap: 11 (ref 5–15)
BUN: 13 mg/dL (ref 6–20)
CO2: 23 mmol/L (ref 22–32)
Calcium: 9.3 mg/dL (ref 8.9–10.3)
Chloride: 104 mmol/L (ref 98–111)
Creatinine, Ser: 0.9 mg/dL (ref 0.61–1.24)
GFR calc Af Amer: >60 mL/min (ref >60)
GFR calc non Af Amer: >60 mL/min (ref >60)
Glucose, Bld: 118 mg/dL — ABNORMAL HIGH (ref 70–99)
Potassium: 3.4 mmol/L — ABNORMAL LOW (ref 3.5–5.1)
Sodium: 138 mmol/L (ref 135–145)
Total Bilirubin: 0.7 mg/dL (ref 0.3–1.2)
Total Protein: 8 g/dL (ref 6.5–8.1)

## 2018-06-25 LAB — CBG MONITORING, ED: Glucose-Capillary: 92 mg/dL (ref 70–99)

## 2018-06-25 LAB — LIPASE, BLOOD: Lipase: 27 U/L (ref 11–51)

## 2018-06-25 MED ORDER — ACETAMINOPHEN 650 MG RE SUPP: 650.0000 mg | Freq: Four times a day (QID) | RECTAL | Status: DC | PRN

## 2018-06-25 MED ORDER — ONDANSETRON HCL 4 MG/2ML IJ SOLN
4.0000 mg | Freq: Once | INTRAMUSCULAR | Status: AC
  Administered 2018-06-25: 4 mg via INTRAVENOUS
  Filled 2018-06-25: qty 2

## 2018-06-25 MED ORDER — HYDROMORPHONE HCL 1 MG/ML IJ SOLN
1.0000 mg | Freq: Once | INTRAMUSCULAR | Status: AC
  Administered 2018-06-25: 05:00:00 1 mg via INTRAVENOUS
  Filled 2018-06-25: qty 1

## 2018-06-25 MED ORDER — ONDANSETRON HCL 4 MG PO TABS
4.0000 mg | ORAL_TABLET | Freq: Four times a day (QID) | ORAL | Status: DC | PRN
  Administered 2018-06-26: 4 mg via ORAL
  Filled 2018-06-25: qty 1

## 2018-06-25 MED ORDER — FLEET ENEMA 7-19 GM/118ML RE ENEM: 1.0000 | ENEMA | Freq: Once | RECTAL | Status: DC | PRN

## 2018-06-25 MED ORDER — POLYETHYLENE GLYCOL 3350 17 G PO PACK: 17.0000 g | PACK | Freq: Every day | ORAL | Status: DC | PRN

## 2018-06-25 MED ORDER — MORPHINE SULFATE (PF) 4 MG/ML IV SOLN
4.0000 mg | INTRAVENOUS | Status: DC | PRN
  Administered 2018-06-25: 4 mg via INTRAVENOUS
  Filled 2018-06-25: qty 1

## 2018-06-25 MED ORDER — IOHEXOL 300 MG/ML  SOLN
100.0000 mL | Freq: Once | INTRAMUSCULAR | Status: AC | PRN
  Administered 2018-06-25: 100 mL via INTRAVENOUS

## 2018-06-25 MED ORDER — SENNOSIDES-DOCUSATE SODIUM 8.6-50 MG PO TABS: 1.0000 | ORAL_TABLET | Freq: Every evening | ORAL | Status: DC | PRN

## 2018-06-25 MED ORDER — HYDROMORPHONE HCL 1 MG/ML IJ SOLN
1.0000 mg | INTRAMUSCULAR | Status: DC | PRN
  Administered 2018-06-25 – 2018-06-29 (×30): 1 mg via INTRAVENOUS
  Filled 2018-06-25 (×32): qty 1

## 2018-06-25 MED ORDER — ACETAMINOPHEN 325 MG PO TABS: 650.0000 mg | ORAL_TABLET | Freq: Four times a day (QID) | ORAL | Status: DC | PRN

## 2018-06-25 MED ORDER — PANTOPRAZOLE SODIUM 40 MG PO TBEC
40.0000 mg | DELAYED_RELEASE_TABLET | Freq: Every day | ORAL | Status: DC
  Administered 2018-06-25 – 2018-06-26 (×2): 40 mg via ORAL
  Filled 2018-06-25 (×2): qty 1

## 2018-06-25 MED ORDER — BISACODYL 5 MG PO TBEC
5.0000 mg | DELAYED_RELEASE_TABLET | Freq: Every day | ORAL | Status: DC | PRN
  Administered 2018-06-27: 5 mg via ORAL
  Filled 2018-06-25: qty 1

## 2018-06-25 MED ORDER — SODIUM CHLORIDE 0.9 % IV BOLUS
1000.0000 mL | Freq: Once | INTRAVENOUS | Status: AC
  Administered 2018-06-25: 1000 mL via INTRAVENOUS

## 2018-06-25 MED ORDER — ONDANSETRON HCL 4 MG/2ML IJ SOLN
4.0000 mg | Freq: Four times a day (QID) | INTRAMUSCULAR | Status: DC | PRN
  Administered 2018-06-25 – 2018-06-29 (×3): 4 mg via INTRAVENOUS
  Filled 2018-06-25 (×3): qty 2

## 2018-06-25 MED ORDER — OXYCODONE HCL 5 MG PO TABS: 10.0000 mg | ORAL_TABLET | ORAL | Status: DC | PRN

## 2018-06-25 MED ORDER — LORAZEPAM 2 MG/ML IJ SOLN
1.0000 mg | Freq: Once | INTRAMUSCULAR | Status: AC | PRN
  Administered 2018-06-25: 1 mg via INTRAVENOUS
  Filled 2018-06-25: qty 1

## 2018-06-25 MED ORDER — HYDROCODONE-ACETAMINOPHEN 5-325 MG PO TABS
1.0000 | ORAL_TABLET | ORAL | Status: DC | PRN
  Administered 2018-06-25 – 2018-06-29 (×19): 1 via ORAL
  Filled 2018-06-25 (×20): qty 1

## 2018-06-25 MED ORDER — DEXTROSE-NACL 5-0.45 % IV SOLN
INTRAVENOUS | Status: DC
  Administered 2018-06-25 (×2): via INTRAVENOUS

## 2018-06-25 MED ORDER — ENOXAPARIN SODIUM 40 MG/0.4ML ~~LOC~~ SOLN
40.0000 mg | SUBCUTANEOUS | Status: DC
  Administered 2018-06-25 – 2018-06-30 (×6): 40 mg via SUBCUTANEOUS
  Filled 2018-06-25 (×6): qty 0.4

## 2018-06-25 MED ORDER — HYDROMORPHONE HCL 1 MG/ML IJ SOLN
1.0000 mg | Freq: Once | INTRAMUSCULAR | Status: AC
  Administered 2018-06-25: 1 mg via INTRAVENOUS
  Filled 2018-06-25: qty 1

## 2018-06-25 MED ORDER — HYDRALAZINE HCL 20 MG/ML IJ SOLN: 10.0000 mg | INTRAMUSCULAR | Status: DC | PRN

## 2018-06-25 MED ORDER — GADOBUTROL 1 MMOL/ML IV SOLN
10.0000 mL | Freq: Once | INTRAVENOUS | Status: AC | PRN
  Administered 2018-06-25: 10 mL via INTRAVENOUS

## 2018-06-25 MED ORDER — HYDROMORPHONE HCL 1 MG/ML IJ SOLN: 1.0000 mg | INTRAMUSCULAR | Status: DC | PRN

## 2018-06-25 NOTE — ED Provider Notes (Signed)
[facility] DEPT Provider Note   CSN: [id number] Arrival date & time: 06/25/18  0142    History   Chief Complaint Chief Complaint  Patient presents with   Abdominal Pain    HPI Andre Simmons is a 43 y.o. 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  The patient endorses intermittent abdominal pain for the last 48 hours accompanied by nausea and anorexia.  He reports the pain will come in waves that would last for approximately 40 to 45 minutes before resolving spontaneously.  He is unsure of any factors that bring on or help the pain resolved as well as any aggravating or alleviating factors.  He reports that he has had no appetite since onset of pain and has not eaten in almost 48 hours.  He reports that he is been tolerating fluids without difficulty as he has had no vomiting.  He reports he is also been feeling bloated but has had no increased burping or belching.  He also reports 3-4 episodes of loose, nonbloody stools earlier today.  He reports the pain feels similar to his initial presentation of pancreatitis.  He denies dysuria, hematuria, penile or testicular pain or swelling, back pain, fever, chills, chest pain, shortness of breath, hematemesis, melena, or hematochezia.  He reports that he infrequently smokes cigarettes.  He has not drank alcohol since his distal pancreatectomy in late 2019.  He denies any other IV or recreational drug use.  He denies treatment prior to arrival.  He reports that he does not have any ibuprofen or Tylenol in his home and has not wanted to go to the store due to COVID-19.       The history is provided by the patient. No language interpreter was used.    Past Medical History:  Diagnosis Date   Arthritis    Chronic back pain    Depression    GERD (gastroesophageal  reflux disease)    Knee pain    Pancreatic tumor     Patient Active Problem List   Diagnosis Date Noted   Abdominal pain 06/25/2018   Neuroendocrine tumor of pancreas 11/25/2017   Acute pancreatitis 09/26/2017   Chronic back pain    Nausea and vomiting in adult patient    Abnormal MRI 09/18/2017   Pancreatic lesion 09/18/2017    Past Surgical History:  Procedure Laterality Date   BACK SURGERY     L4-L5, S1 micro discectomy   EUS N/A 09/25/2017   Procedure: UPPER ENDOSCOPIC ULTRASOUND (EUS) LINEAR;  Surgeon: Milus Banister, MD;  Location: [facility];  Service: Endoscopy;  Laterality: N/A;  Radial and Linear   FINE NEEDLE ASPIRATION  09/25/2017   Procedure: FINE NEEDLE ASPIRATION (FNA) RADIAL;  Surgeon: Milus Banister, MD;  Location: [facility];  Service: Endoscopy;;   KNEE SURGERY Left    x 20 surgeries   LAPAROSCOPIC DISTAL PANCREATECTOMY  11/26/2017   Diagnostic laparoscopy, laparoscopic hand-assisted distal    MOUTH SURGERY     as a child, unsure if wisdom teeth or tonsils   PANCREATECTOMY N/A 11/25/2017   Procedure: LAPAROSCOPIC DISTAL PANCREATECTOMY AND SPLENECTOMY ERAS PATHWAY;  Surgeon: Stark Klein, MD;  Location: [facility];  Service: General;  Laterality: N/A;        Home Medications    Prior to Admission medications   Medication Sig Start Date End Date Taking?  Authorizing Provider  omeprazole (PRILOSEC) 40 MG capsule Take 40 mg by mouth 2 (two) times daily.    Yes [provider]  gabapentin (NEURONTIN) 300 MG capsule Take 1 capsule (300 mg total) by mouth 2 (two) times daily. Patient not taking: Reported on 06/25/2018 12/02/17   Stark Klein, MD  HYDROmorphone (DILAUDID) 2 MG tablet Take 1-2 tablets (2-4 mg total) by mouth every 3 (three) hours as needed for moderate pain or severe pain. Patient not taking: Reported on 06/25/2018 12/02/17   Stark Klein, MD  methocarbamol (ROBAXIN) 500 MG tablet Take 1 tablet (500 mg total) by mouth 2  (two) times daily as needed for muscle spasms. Patient not taking: Reported on 06/25/2018 03/15/18   Ward, Ozella Almond, PA-C  methylPREDNISolone (MEDROL DOSEPAK) 4 MG TBPK tablet Take as directed on package. Patient not taking: Reported on 06/25/2018 03/15/18   Ward, Ozella Almond, PA-C  oxyCODONE-acetaminophen (PERCOCET/ROXICET) 5-325 MG tablet Take 2 tablets by mouth every 4 (four) hours as needed for severe pain. Patient not taking: Reported on 06/25/2018 03/15/18   Ward, Ozella Almond, PA-C    Family History Family History  Problem Relation Age of Onset   Thyroid cancer Mother    Other Father        MVA at age 33   Esophageal cancer Maternal Grandmother        smoker   Breast cancer Maternal Grandmother    Esophageal cancer Paternal Grandmother    Colon cancer Neg Hx    Rectal cancer Neg Hx     Social History Social History   Tobacco Use   Smoking status: Former Smoker   Smokeless tobacco: Never Used   Tobacco comment: Quit 14-15 years ago (as of 2019)  Substance Use Topics   Alcohol use: No   Drug use: Yes    Types: Psilocybin     Allergies   Patient has no known allergies.   Review of Systems Review of Systems  Constitutional: Negative for appetite change and fever.  Respiratory: Negative for shortness of breath and wheezing.   Cardiovascular: Negative for chest pain.  Gastrointestinal: Positive for abdominal pain, diarrhea and nausea. Negative for anal bleeding, blood in stool, constipation and vomiting.  Genitourinary: Negative for dysuria, flank pain, hematuria, penile swelling, scrotal swelling, testicular pain and urgency.  Musculoskeletal: Negative for arthralgias, back pain, myalgias, neck pain and neck stiffness.  Skin: Negative for rash.  Allergic/Immunologic: Negative for immunocompromised state.  Neurological: Negative for dizziness, syncope, weakness, numbness and headaches.  Psychiatric/Behavioral: Negative for confusion.     Physical  Exam Updated Vital Signs BP 113/77    Pulse 79    Temp 98.4 F (36.9 C) (Oral)    Resp 16    Ht 6\' 3"  (1.905 m)    Wt 102.1 kg    SpO2 94%    BMI 28.12 kg/m   Physical Exam Vitals signs and nursing note reviewed.  Constitutional:      Appearance: He is well-developed.  HENT:     Head: Normocephalic.  Eyes:     General: No scleral icterus.    Conjunctiva/sclera: Conjunctivae normal.  Neck:     Musculoskeletal: Normal range of motion and neck supple.  Cardiovascular:     Rate and Rhythm: Normal rate and regular rhythm.     Pulses: Normal pulses.     Heart sounds: Normal heart sounds. No murmur. No friction rub. No gallop.   Pulmonary:     Effort: Pulmonary effort is normal. No  respiratory distress.     Breath sounds: No stridor. No wheezing, rhonchi or rales.  Chest:     Chest wall: No tenderness.  Abdominal:     General: There is no distension.     Palpations: Abdomen is soft. There is no mass.     Tenderness: There is abdominal tenderness. There is guarding. There is no right CVA tenderness, left CVA tenderness or rebound.     Hernia: No hernia is present.     Comments: Tender to palpation in the epigastric region with mild guarding.  No rebound.  Abdomen is soft, but mildly distended.  Negative Murphy sign.  No tenderness over McBurney's point.  No CVA tenderness bilaterally.  Musculoskeletal: Normal range of motion.  Skin:    General: Skin is warm and dry.     Capillary Refill: Capillary refill takes less than 2 seconds.     Coloration: Skin is not jaundiced.  Neurological:     Mental Status: He is alert.  Psychiatric:        Behavior: Behavior normal.      ED Treatments / Results  Labs (all labs ordered are listed, but only abnormal results are displayed) Labs Reviewed  CBC WITH DIFFERENTIAL/PLATELET - Abnormal; Notable for the following components:      Result Value   WBC 13.4 (*)    Platelets 523 (*)    Neutro Abs 8.0 (*)    All other components within  normal limits  COMPREHENSIVE METABOLIC PANEL - Abnormal; Notable for the following components:   Potassium 3.4 (*)    Glucose, Bld 118 (*)    All other components within normal limits  LIPASE, BLOOD    EKG None  Radiology Ct Abdomen Pelvis W Contrast  Result Date: 06/25/2018 CLINICAL DATA:  Abdominal pain and nausea for 1 day. Surgery for resection of 4 cm solid pseudo papillary neoplasm of the distal pancreas October 2019. Patient states symptoms are similar to prior to surgery. EXAM: CT ABDOMEN AND PELVIS WITH CONTRAST TECHNIQUE: Multidetector CT imaging of the abdomen and pelvis was performed using the standard protocol following bolus administration of intravenous contrast. CONTRAST:  17mL OMNIPAQUE IOHEXOL 300 MG/ML  SOLN COMPARISON:  CT of the abdomen and pelvis 11/03/2017 FINDINGS: Lower chest: Mild dependent atelectasis is present at the lung bases. No focal nodule, mass, or airspace disease is present. Heart size is normal. No significant pleural or pericardial effusion is present. Hepatobiliary: There is diffuse fatty infiltration liver. No discrete lesions are present. Common bile duct and gallbladder are normal. Pancreas: Distal pancreas is resected. There is a low-density lesion at the resection site measuring 2.3 x 2.6 x 3.8 cm. Pancreatic head and proximal body are normal. No inflammatory changes are associated. Spleen: Surgically absent Adrenals/Urinary Tract: Adrenal glands are normal bilaterally. Kidneys and ureters are within normal limits. The urinary bladder is within normal limits. Stomach/Bowel: The stomach and duodenum are within normal limits. Small bowel is unremarkable. Terminal ileum is within normal limits. The appendix is visualized and normal. The ascending and transverse colon are normal. The descending and sigmoid colon are normal. Vascular/Lymphatic: Atherosclerotic calcifications are again noted in the distal aorta. There is no aneurysm. Reproductive: Prostate is  unremarkable. Other: Vertebral body heights alignment are maintained. Mild degenerative changes are noted at L5-S1. No focal lytic or blastic lesions are present. Bony pelvis is within normal limits. Hips are located and normal. Musculoskeletal: Vertebral body heights alignment are maintained. No focal lytic or blastic lesions are present. Degenerative  changes are noted at L5-S1. Bony pelvis is within normal limits. Hips are located and normal. IMPRESSION: 1. Low-density cystic lesion at the site of the pancreatic resection measures 2.3 x 2.6 x 3.8 cm. No definite solid components are present. This is nonspecific and may be postoperative in nature. Recurrent tumor is not excluded. Elective MRI of the pancreas without and with contrast could be used for further evaluation. 2. No acute abnormality. 3. Hepatic steatosis. 4.  Aortic Atherosclerosis (ICD10-I70.0). Electronically Signed   By: San Morelle M.D.   On: 06/25/2018 04:44    Procedures Procedures (including critical care time)  Medications Ordered in ED Medications  HYDROmorphone (DILAUDID) injection 1 mg (has no administration in time range)  gadobutrol (GADAVIST) 1 MMOL/ML injection 10 mL (has no administration in time range)  HYDROmorphone (DILAUDID) injection 1 mg (1 mg Intravenous Given 06/25/18 0236)  ondansetron (ZOFRAN) injection 4 mg (4 mg Intravenous Given 06/25/18 0236)  sodium chloride 0.9 % bolus 1,000 mL (0 mLs Intravenous Stopped 06/25/18 0438)  iohexol (OMNIPAQUE) 300 MG/ML solution 100 mL (100 mLs Intravenous Contrast Given 06/25/18 0427)  HYDROmorphone (DILAUDID) injection 1 mg (1 mg Intravenous Given 06/25/18 0506)  LORazepam (ATIVAN) injection 1 mg (1 mg Intravenous Given 06/25/18 0616)     Initial Impression / Assessment and Plan / ED Course  I have reviewed the triage vital signs and the nursing notes.  Pertinent labs & imaging results that were available during my care of the patient were reviewed by me and  considered in my medical decision making (see chart for details).        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  States that symptoms feel similar to when he was previously diagnosed with a benign pancreatic tumor that required distal pancreatectomy and splenectomy within the last year.  Per chart review, patient required significant amounts of pain medication for pain control and had multiple ER visits prior to surgical intervention.   On exam, he has mild guarding with palpation in the epigastric region.  Abdomen is mildly distended, but soft.  No peritonitis at this time.    Labs are notable for mild leukocytosis of 13.4 with an elevated neutrophil count of 8.  He has a thrombocytosis of 523, which appears chronic.  Labs are otherwise grossly unremarkable, including lipase, which is normal.  I have a low suspicion for peptic ulcer disease, cholecystitis, or choledocholithiasis at this time.  CT abdomen pelvis with low-density cystic lesion at the site of the pancreatic resection with no definite solid components.  This is concerning for postoperative changes, but recurrent tumor is not excluded.  MRI of the pancreas with and without contrast could be used for further evaluation.  These findings were discussed with the patient.  The patient has required multiple doses of pain medication since arrival in the ER for pain control.  The patient was discussed with Dr. Clydell Hakim, attending physician.  There is a strong concern that the patient will bounce back to the ER for pain control.  Given this consideration in the setting of COVID-19 pandemic with limited outpatient imaging studies available, the hospitalist team was consulted for admission.  Dr. Hal Hope has accepted the patient for admission.  He is requested consult with general surgery.  Spoke with Dr. Marcello Moores with general  surgery who recommended having the patient follow-up with Dr. Barry Dienes in the clinic after he was discharged.  The patient appears reasonably stabilized for admission considering the current resources, flow, and capabilities available in the ED at this time, and I doubt any other [facility] requiring further screening and/or treatment in the ED prior to admission.     Final Clinical Impressions(s) / ED Diagnoses   Final diagnoses:  Epigastric abdominal pain  At risk for inadequate pain control    ED Discharge Orders    None       Joanne Gavel, PA-C 06/25/18 0655    Palumbo, April, MD 06/25/18 2320

## 2018-06-25 NOTE — Consult Note (Addendum)
[facility] Surgery Consult note  Andre Simmons 24-Oct-1975  [id number].    Requesting MD: Dr. Jeanie Sewer  Chief Complaint Abdominal pain    Reason for Consult: Pt with hx of  cT2 N0 M0 pancreatic neuroendocrine tumor, S/p  Diagnostic laparoscopy, laparoscopic hand-assisted distal  pancreatectomy and splenectomy, 11/25/17, by Dr. Stark Klein; discharged on 12/02/17. No post op chemotherapy or radiation therapy.  He has done well till 12/23/18 around 10 PM, he started having new pain epigastric just below the sternum that got progressively worse, he has had some nausea and sweats without fever.  He did not take anything to make it better.  Nothing made it worse, he has not eaten, no appetite, just taking water.  Smell of food made him sick.  Pain got worse and he came to the ED.  Workup in the ED:  Afebrile, BP is up.  Labs are unremarkable except a K+3.4, glucose 118, WBC 13.4.  Lipase is normal at 27.   CT with contrast shows mild LL atelectasis, no effusion.2.2 x 2.5 x 3.8 cm at the site of the resection.  Pancreatic head and proximal body are normal. No inflammatory changes are associated.Soleen is surgically absent.  WUJ:WJXBJYNW: Status post distal pancreatectomy. At the pancreatectomy margin, there is an irregular rounded 3.9 x 2.1 x 2.7 cm collection with mixed T1 and T2 signal intensity and no convincing enhancement or significant wall thickening (series 1105/image 61). No additional pancreatic lesions. No pancreatic duct dilation. There is mild fullness of the pancreatic head with associated mild pancreatic and peripancreatic edema (series 3/image 46), which may indicate a mild acute pancreatitis. No pancreas divisum. Spleen: Surgically absent.   Currnently complaining of aching, increasing pain, nausea is better with medicines, but coming back.  Says pain is 9/10 currently.  Being admitted by Medicine.     ROS: Review of Systems  Constitutional: Negative for chills,  fever and weight loss.       Just sweats  HENT: Negative.   Eyes: Negative.   Respiratory: Negative.   Cardiovascular: Negative.   Gastrointestinal: Positive for abdominal pain (mid upper epigastric area), diarrhea (since surgery ), heartburn (on PPI) and nausea. Negative for blood in stool, constipation, melena and vomiting.  Genitourinary: Negative.   Musculoskeletal: Positive for back pain and joint pain (left knee/partail knee replacement).  Neurological: Negative.   Endo/Heme/Allergies: Negative.   Psychiatric/Behavioral: Negative.      Family History  Problem Relation Age of Onset  . Thyroid cancer Mother   . Other Father        MVA at age 93  . Esophageal cancer Maternal Grandmother        smoker  . Breast cancer Maternal Grandmother   . Esophageal cancer Paternal Grandmother   . Colon cancer Neg Hx   . Rectal cancer Neg Hx     Past Medical History:  Diagnosis Date  . Arthritis   . Chronic back pain   . Depression   . GERD (gastroesophageal reflux disease)   . Knee pain   . Pancreatic tumor     Past Surgical History:  Procedure Laterality Date  . BACK SURGERY     L4-L5, S1 micro discectomy  . EUS N/A 09/25/2017   Procedure: UPPER ENDOSCOPIC ULTRASOUND (EUS) LINEAR;  Surgeon: Milus Banister, MD;  Location: [facility];  Service: Endoscopy;  Laterality: N/A;  Radial and Linear  . FINE NEEDLE ASPIRATION  09/25/2017   Procedure: FINE NEEDLE ASPIRATION (FNA) RADIAL;  Surgeon: Ardis Hughs,  Melene Plan, MD;  Location: [facility];  Service: Endoscopy;;  . KNEE SURGERY Left    x 20 surgeries  . LAPAROSCOPIC DISTAL PANCREATECTOMY  11/26/2017   Diagnostic laparoscopy, laparoscopic hand-assisted distal   . MOUTH SURGERY     as a child, unsure if wisdom teeth or tonsils  . PANCREATECTOMY N/A 11/25/2017   Procedure: LAPAROSCOPIC DISTAL PANCREATECTOMY AND SPLENECTOMY ERAS PATHWAY;  Surgeon: Stark Klein, MD;  Location: [facility];  Service: General;  Laterality: N/A;    Social  History:  reports that he has quit smoking. He has never used smokeless tobacco. He reports current drug use. Drug: Psilocybin. He reports that he does not drink alcohol. ETOH:  None Drugs:  None Tobaccco:  None  Lives with home on disability secondary to chronic back pain   Allergies: No Known Allergies  Prior to Admission medications   Medication Sig Start Date End Date Taking? Authorizing Provider  omeprazole (PRILOSEC) 40 MG capsule Take 40 mg by mouth 2 (two) times daily.  Only drug he is currently takingl  Yes [provider]  gabapentin (NEURONTIN) 300 MG capsule Take 1 capsule (300 mg total) by mouth 2 (two) times daily. Patient not taking: Reported on 06/25/2018 12/02/17   Stark Klein, MD  HYDROmorphone (DILAUDID) 2 MG tablet Take 1-2 tablets (2-4 mg total) by mouth every 3 (three) hours as needed for moderate pain or severe pain. Patient not taking: Reported on 06/25/2018 12/02/17 Not taking  Stark Klein, MD  methocarbamol (ROBAXIN) 500 MG tablet Take 1 tablet (500 mg total) by mouth 2 (two) times daily as needed for muscle spasms. Patient not taking: Reported on 06/25/2018 03/15/18   Ward, Ozella Almond, PA-C  methylPREDNISolone (MEDROL DOSEPAK) 4 MG TBPK tablet Take as directed on package. Patient not taking: Reported on 06/25/2018 03/15/18   Ward, Ozella Almond, PA-C  oxyCODONE-acetaminophen (PERCOCET/ROXICET) 5-325 MG tablet Take 2 tablets by mouth every 4 (four) hours as needed for severe pain. Patient not taking: Reported on 06/25/2018 03/15/18   Ward, Ozella Almond, PA-C     Blood pressure 121/71, pulse 77, temperature 98.4 F (36.9 C), temperature source Oral, resp. rate 16, height 6\' 3"  (1.905 m), weight 102.1 kg, SpO2 95 %.    Physical Exam:   Physical Exam Constitutional:      General: He is not in acute distress.    Appearance: He is well-developed and normal weight. He is not ill-appearing, toxic-appearing or diaphoretic.  HENT:     Head: Normocephalic  and atraumatic.  Eyes:     General: No scleral icterus.    Comments: Pupils are equal  Cardiovascular:     Rate and Rhythm: Normal rate and regular rhythm.     Heart sounds: No murmur.  Pulmonary:     Effort: Pulmonary effort is normal. No respiratory distress.     Breath sounds: Normal breath sounds. No stridor. No wheezing, rhonchi or rales.  Chest:     Chest wall: No tenderness.  Abdominal:     General: Abdomen is flat. A surgical scar is present. Bowel sounds are normal.     Palpations: Abdomen is soft.     Tenderness: There is abdominal tenderness in the epigastric area. There is no right CVA tenderness, left CVA tenderness, guarding or rebound. Negative signs include Murphy's sign, Rovsing's sign, McBurney's sign, psoas sign and obturator sign.     Hernia: No hernia is present.     Comments: Upper midline pain below the xyphoid Last BM yesterday Surgical  scar well healed  Skin:    General: Skin is warm and dry.     Coloration: Skin is not cyanotic, jaundiced, mottled or pale.     Findings: No erythema or rash.  Neurological:     General: No focal deficit present.     Mental Status: He is alert and oriented to person, place, and time.     Cranial Nerves: No cranial nerve deficit.  Psychiatric:        Mood and Affect: Mood is anxious and depressed.        Behavior: Behavior normal.     Results for orders placed or performed during the hospital encounter of 06/25/18 (from the past 48 hour(s))  CBC with Differential     Status: Abnormal   Collection Time: 06/25/18  2:18 AM  Result Value Ref Range   WBC 13.4 (H) 4.0 - 10.5 K/uL   RBC 4.50 4.22 - 5.81 MIL/uL   Hemoglobin 14.4 13.0 - 17.0 g/dL   HCT 42.7 39.0 - 52.0 %   MCV 94.9 80.0 - 100.0 fL   MCH 32.0 26.0 - 34.0 pg   MCHC 33.7 30.0 - 36.0 g/dL   RDW 13.5 11.5 - 15.5 %   Platelets 523 (H) 150 - 400 K/uL   nRBC 0.0 0.0 - 0.2 %   Neutrophils Relative % 60 %   Neutro Abs 8.0 (H) 1.7 - 7.7 K/uL   Lymphocytes Relative  30 %   Lymphs Abs 4.0 0.7 - 4.0 K/uL   Monocytes Relative 7 %   Monocytes Absolute 0.9 0.1 - 1.0 K/uL   Eosinophils Relative 2 %   Eosinophils Absolute 0.3 0.0 - 0.5 K/uL   Basophils Relative 1 %   Basophils Absolute 0.1 0.0 - 0.1 K/uL   Immature Granulocytes 0 %   Abs Immature Granulocytes 0.05 0.00 - 0.07 K/uL    Comment: Performed at [facility], [place] [address], [place], [place] [address]  Lipase, blood     Status: None   Collection Time: 06/25/18  2:18 AM  Result Value Ref Range   Lipase 27 11 - 51 U/L    Comment: Performed at [facility], [place] [address], [place], [place] [address]  Comprehensive metabolic panel     Status: Abnormal   Collection Time: 06/25/18  2:18 AM  Result Value Ref Range   Sodium 138 135 - 145 mmol/L   Potassium 3.4 (L) 3.5 - 5.1 mmol/L   Chloride 104 98 - 111 mmol/L   CO2 23 22 - 32 mmol/L   Glucose, Bld 118 (H) 70 - 99 mg/dL   BUN 13 6 - 20 mg/dL   Creatinine, Ser 0.90 0.61 - 1.24 mg/dL   Calcium 9.3 8.9 - 10.3 mg/dL   Total Protein 8.0 6.5 - 8.1 g/dL   Albumin 4.5 3.5 - 5.0 g/dL   AST 21 15 - 41 U/L   ALT 44 0 - 44 U/L   Alkaline Phosphatase 86 38 - 126 U/L   Total Bilirubin 0.7 0.3 - 1.2 mg/dL   GFR calc non Af Amer >60 >60 mL/min   GFR calc Af Amer >60 >60 mL/min   Anion gap 11 5 - 15    Comment: Performed at [facility], [place] [address] [place], [place] [address]  CBG monitoring, ED     Status: None   Collection Time: 06/25/18  8:04 AM  Result Value Ref Range   Glucose-Capillary 92 70 - 99  mg/dL   Mr Abdomen W Or Wo Contrast  Result Date: 06/25/2018 CLINICAL DATA:  Status post distal pancreatectomy and splenectomy 11/25/2017 for 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 Patient presents with abdominal pain and nausea for 1 day. Low-attenuation focus at the resection site on CT performed earlier today. EXAM: MRI ABDOMEN WITHOUT AND WITH CONTRAST TECHNIQUE: Multiplanar  multisequence MR imaging of the abdomen was performed both before and after the administration of intravenous contrast. CONTRAST:  10 cc Gadavist IV. COMPARISON:  06/25/2018 CT abdomen/pelvis.  09/11/2017 MRI abdomen FINDINGS: Lower chest: No acute abnormality at the lung bases. Hepatobiliary: Normal liver size and configuration. Prominent diffuse hepatic steatosis. No liver mass. Normal gallbladder with no cholelithiasis. No biliary ductal dilatation. Common bile duct diameter 2 mm. No evidence of choledocholithiasis. Pancreas: Status post distal pancreatectomy. At the pancreatectomy margin, there is an irregular rounded 3.9 x 2.1 x 2.7 cm collection with mixed T1 and T2 signal intensity and no convincing enhancement or significant wall thickening (series 1105/image 61). No additional pancreatic lesions. No pancreatic duct dilation. There is mild fullness of the pancreatic head with associated mild pancreatic and peripancreatic edema (series 3/image 46), which may indicate a mild acute pancreatitis. No pancreas divisum. Spleen: Surgically absent. Adrenals/Urinary Tract: Normal adrenals. No hydronephrosis. Simple 0.9 cm upper right renal cyst. No suspicious renal masses. Stomach/Bowel: Normal non-distended stomach. Visualized small and large bowel is normal caliber, with no bowel wall thickening. Vascular/Lymphatic: Normal caliber abdominal aorta. Patent portal, hepatic and renal veins. Top-normal peripancreatic 0.9 cm node (series 1104/image 61) is not appreciably changed compared to the preoperative MRI of 09/11/2017. No pathologically enlarged lymph nodes in the abdomen. Other: No ascites. Musculoskeletal: No aggressive appearing focal osseous lesions. IMPRESSION: 1. Nonenhancing irregular rounded 3.9 x 2.1 x 2.7 cm complex collection at the pancreatectomy margin, most suggestive of a pseudocyst or postoperative collection. Findings are not favored to represent local tumor recurrence. Suggest follow-up MRI  abdomen without and with IV contrast in 3 months. 2. Mild pancreatic and peripancreatic edema in the pancreatic head, which may indicate a mild acute pancreatitis. 3. No findings of metastatic disease in the abdomen. No biliary or pancreatic duct dilation. 4. Prominent diffuse hepatic steatosis. Electronically Signed   By: Ilona Sorrel M.D.   On: 06/25/2018 08:22   Ct Abdomen Pelvis W Contrast  Result Date: 06/25/2018 CLINICAL DATA:  Abdominal pain and nausea for 1 day. Surgery for resection of 4 cm solid pseudo papillary neoplasm of the distal pancreas October 2019. Patient states symptoms are similar to prior to surgery. EXAM: CT ABDOMEN AND PELVIS WITH CONTRAST TECHNIQUE: Multidetector CT imaging of the abdomen and pelvis was performed using the standard protocol following bolus administration of intravenous contrast. CONTRAST:  151mL OMNIPAQUE IOHEXOL 300 MG/ML  SOLN COMPARISON:  CT of the abdomen and pelvis 11/03/2017 FINDINGS: Lower chest: Mild dependent atelectasis is present at the lung bases. No focal nodule, mass, or airspace disease is present. Heart size is normal. No significant pleural or pericardial effusion is present. Hepatobiliary: There is diffuse fatty infiltration liver. No discrete lesions are present. Common bile duct and gallbladder are normal. Pancreas: Distal pancreas is resected. There is a low-density lesion at the resection site measuring 2.3 x 2.6 x 3.8 cm. Pancreatic head and proximal body are normal. No inflammatory changes are associated. Spleen: Surgically absent Adrenals/Urinary Tract: Adrenal glands are normal bilaterally. Kidneys and ureters are within normal limits. The urinary bladder is within normal limits. Stomach/Bowel: The stomach and duodenum  are within normal limits. Small bowel is unremarkable. Terminal ileum is within normal limits. The appendix is visualized and normal. The ascending and transverse colon are normal. The descending and sigmoid colon are normal.  Vascular/Lymphatic: Atherosclerotic calcifications are again noted in the distal aorta. There is no aneurysm. Reproductive: Prostate is unremarkable. Other: Vertebral body heights alignment are maintained. Mild degenerative changes are noted at L5-S1. No focal lytic or blastic lesions are present. Bony pelvis is within normal limits. Hips are located and normal. Musculoskeletal: Vertebral body heights alignment are maintained. No focal lytic or blastic lesions are present. Degenerative changes are noted at L5-S1. Bony pelvis is within normal limits. Hips are located and normal. IMPRESSION: 1. Low-density cystic lesion at the site of the pancreatic resection measures 2.3 x 2.6 x 3.8 cm. No definite solid components are present. This is nonspecific and may be postoperative in nature. Recurrent tumor is not excluded. Elective MRI of the pancreas without and with contrast could be used for further evaluation. 2. No acute abnormality. 3. Hepatic steatosis. 4.  Aortic Atherosclerosis (ICD10-I70.0). Electronically Signed   By: San Morelle M.D.   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 Patient presents with abdominal pain and nausea for 1 day. Low-attenuation focus at the resection site on CT performed earlier today. EXAM: MRI ABDOMEN WITHOUT AND WITH CONTRAST TECHNIQUE: Multiplanar multisequence MR imaging of the abdomen was performed both before and after the administration of intravenous contrast. CONTRAST:  10 cc Gadavist IV. COMPARISON:  06/25/2018 CT abdomen/pelvis.  09/11/2017 MRI abdomen FINDINGS: Lower chest: No acute abnormality at the lung bases. Hepatobiliary: Normal liver size and configuration. Prominent diffuse hepatic steatosis. No liver mass. Normal gallbladder with no cholelithiasis. No biliary ductal dilatation. Common bile  duct diameter 2 mm. No evidence of choledocholithiasis. Pancreas: Status post distal pancreatectomy. At the pancreatectomy margin, there is an irregular rounded 3.9 x 2.1 x 2.7 cm collection with mixed T1 and T2 signal intensity and no convincing enhancement or significant wall thickening (series 1105/image 61). No additional pancreatic lesions. No pancreatic duct dilation. There is mild fullness of the pancreatic head with associated mild pancreatic and peripancreatic edema (series 3/image 46), which may indicate a mild acute pancreatitis. No pancreas divisum. Spleen: Surgically absent. Adrenals/Urinary Tract: Normal adrenals. No hydronephrosis. Simple 0.9 cm upper right renal cyst. No suspicious renal masses. Stomach/Bowel: Normal non-distended stomach. Visualized small and large bowel is normal caliber, with no bowel wall thickening. Vascular/Lymphatic: Normal caliber abdominal aorta. Patent portal, hepatic and renal veins. Top-normal peripancreatic 0.9 cm node (series 1104/image 61) is not appreciably changed compared to the preoperative MRI of 09/11/2017. No pathologically enlarged lymph nodes in the abdomen. Other: No ascites. Musculoskeletal: No aggressive appearing focal osseous lesions. IMPRESSION: 1. Nonenhancing irregular rounded 3.9 x 2.1 x 2.7 cm complex collection at the pancreatectomy margin, most suggestive of a pseudocyst or postoperative collection. Findings are not favored to represent local tumor recurrence. Suggest follow-up MRI abdomen without and with IV contrast in 3 months. 2. Mild pancreatic and peripancreatic edema in the pancreatic head, which may indicate a mild acute pancreatitis. 3. No findings of metastatic disease in the abdomen. No biliary or pancreatic duct dilation. 4. Prominent diffuse hepatic steatosis. Electronically Signed   By: Ilona Sorrel M.D.   On: 06/25/2018 08:22      Assessment/Plan Multiple back surgeries with some  back pain GERD   Abdominal pain  T2N0M0  pancreatic neuroendocrine tumor, S/p diagnostic laparoscopy, laparoscopic hand-assisted distal pancreatectomy/splenectomy 11/25/2017 with Dr. Stark Klein 3.9 x 2.1 x 2.7 pancreatic fluid collection, fullness of the pancreatic head with mild peripancreatic edema/possible acute pancreatitis  FEN: N.p.o./IV fluids ID: None DVT: SCDs Follow-up: Dr. Stark Klein  Plan: Admit to medicine for rehydration and pain control.  Keep him n.p.o. except for ice chips and sips.  Dr. Johney Maine will review the CT and we will follow with you.  Earnstine Regal [facility] Surgery 06/25/2018, 8:34 AM Pager: [phone] Consults: [phone]

## 2018-06-25 NOTE — ED Notes (Signed)
Patient transported to MRI 

## 2018-06-25 NOTE — ED Triage Notes (Signed)
Pt complains of abdominal pain and nausea for about 24 hours, he states that he had his spleen and tail of pancreas removed 7 months ago and this pain feels the same as it did then, pt denies vomiting

## 2018-06-25 NOTE — ED Notes (Signed)
ED TO INPATIENT HANDOFF REPORT  ED Nurse Name and Phone #: Tana Coast Name/Age/Gender Andre Simmons 43 y.o. male Room/Bed: WA15/WA15  Code Status   Code Status: Full Code  Home/SNF/Other Home Patient oriented to: self, place, time and situation Is this baseline? Yes   Triage Complete: Triage complete  Chief Complaint abdominal  Triage Note Pt complains of abdominal pain and nausea for about 24 hours, he states that he had his spleen and tail of pancreas removed 7 months ago and this pain feels the same as it did then, pt denies vomiting   Allergies No Known Allergies  Level of Care/Admitting Diagnosis ED Disposition    ED Disposition Condition [place]: [place] [[id number]]  Level of Care: Med-Surg [16]  Covid Evaluation: N/A  Diagnosis: Abdominal pain [[id number]]  Admitting Physician: Gerlean Ren [facility] [[id number]]  Attending Physician: Gerlean Ren [facility] [[id number]]  PT Class (Do Not Modify): Observation [104]  PT Acc Code (Do Not Modify): Observation [10022]       B Medical/Surgery History Past Medical History:  Diagnosis Date  . Arthritis   . Chronic back pain   . Depression   . GERD (gastroesophageal reflux disease)   . Knee pain   . Pancreatic tumor    Past Surgical History:  Procedure Laterality Date  . BACK SURGERY     L4-L5, S1 micro discectomy  . EUS N/A 09/25/2017   Procedure: UPPER ENDOSCOPIC ULTRASOUND (EUS) LINEAR;  Surgeon: Milus Banister, MD;  Location: [facility];  Service: Endoscopy;  Laterality: N/A;  Radial and Linear  . FINE NEEDLE ASPIRATION  09/25/2017   Procedure: FINE NEEDLE ASPIRATION (FNA) RADIAL;  Surgeon: Milus Banister, MD;  Location: [facility];  Service: Endoscopy;;  . KNEE SURGERY Left    x 20 surgeries  . LAPAROSCOPIC DISTAL PANCREATECTOMY  11/26/2017   Diagnostic laparoscopy, laparoscopic hand-assisted distal   . MOUTH SURGERY     as a child, unsure if wisdom teeth or tonsils   . PANCREATECTOMY N/A 11/25/2017   Procedure: LAPAROSCOPIC DISTAL PANCREATECTOMY AND SPLENECTOMY ERAS PATHWAY;  Surgeon: Stark Klein, MD;  Location: [place];  Service: General;  Laterality: N/A;     A IV Location/Drains/Wounds Patient Lines/Drains/Airways Status   Active Line/Drains/Airways    Name:   Placement date:   Placement time:   Site:   Days:   Peripheral IV 06/25/18 Left Wrist   06/25/18    0231    Wrist   less than 1   Incision (Closed) 11/25/17 Abdomen Other (Comment)   11/25/17    1225     212   Incision - 2 Ports Abdomen 1: Left;Medial 2: Left;Lateral   11/25/17    1050     212          Intake/Output Last 24 hours No intake or output data in the 24 hours ending 06/25/18 0906  Labs/Imaging Results for orders placed or performed during the hospital encounter of 06/25/18 (from the past 48 hour(s))  CBC with Differential     Status: Abnormal   Collection Time: 06/25/18  2:18 AM  Result Value Ref Range   WBC 13.4 (H) 4.0 - 10.5 K/uL   RBC 4.50 4.22 - 5.81 MIL/uL   Hemoglobin 14.4 13.0 - 17.0 g/dL   HCT 42.7 39.0 - 52.0 %   MCV 94.9 80.0 - 100.0 fL   MCH 32.0 26.0 - 34.0 pg   MCHC 33.7 30.0 - 36.0  g/dL   RDW 13.5 11.5 - 15.5 %   Platelets 523 (H) 150 - 400 K/uL   nRBC 0.0 0.0 - 0.2 %   Neutrophils Relative % 60 %   Neutro Abs 8.0 (H) 1.7 - 7.7 K/uL   Lymphocytes Relative 30 %   Lymphs Abs 4.0 0.7 - 4.0 K/uL   Monocytes Relative 7 %   Monocytes Absolute 0.9 0.1 - 1.0 K/uL   Eosinophils Relative 2 %   Eosinophils Absolute 0.3 0.0 - 0.5 K/uL   Basophils Relative 1 %   Basophils Absolute 0.1 0.0 - 0.1 K/uL   Immature Granulocytes 0 %   Abs Immature Granulocytes 0.05 0.00 - 0.07 K/uL    Comment: Performed at [facility], [place] [address] [place], [place] [address]  Lipase, blood     Status: None   Collection Time: 06/25/18  2:18 AM  Result Value Ref Range   Lipase 27 11 - 51 U/L    Comment: Performed at [facility], [place]  [address], [place], [place] [address]  Comprehensive metabolic panel     Status: Abnormal   Collection Time: 06/25/18  2:18 AM  Result Value Ref Range   Sodium 138 135 - 145 mmol/L   Potassium 3.4 (L) 3.5 - 5.1 mmol/L   Chloride 104 98 - 111 mmol/L   CO2 23 22 - 32 mmol/L   Glucose, Bld 118 (H) 70 - 99 mg/dL   BUN 13 6 - 20 mg/dL   Creatinine, Ser 0.90 0.61 - 1.24 mg/dL   Calcium 9.3 8.9 - 10.3 mg/dL   Total Protein 8.0 6.5 - 8.1 g/dL   Albumin 4.5 3.5 - 5.0 g/dL   AST 21 15 - 41 U/L   ALT 44 0 - 44 U/L   Alkaline Phosphatase 86 38 - 126 U/L   Total Bilirubin 0.7 0.3 - 1.2 mg/dL   GFR calc non Af Amer >60 >60 mL/min   GFR calc Af Amer >60 >60 mL/min   Anion gap 11 5 - 15    Comment: Performed at [facility], [place] [address], [place], [place] [address]  CBG monitoring, ED     Status: None   Collection Time: 06/25/18  8:04 AM  Result Value Ref Range   Glucose-Capillary 92 70 - 99 mg/dL   Mr Abdomen W Or Wo Contrast  Result Date: 06/25/2018 CLINICAL DATA:  Status post distal pancreatectomy and splenectomy 11/25/2017 for 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 Patient presents with abdominal pain and nausea for 1 day. Low-attenuation focus at the resection site on CT performed earlier today. EXAM: MRI ABDOMEN WITHOUT AND WITH CONTRAST TECHNIQUE: Multiplanar multisequence MR imaging of the abdomen was performed both before and after the administration of intravenous contrast. CONTRAST:  10 cc Gadavist IV. COMPARISON:  06/25/2018 CT abdomen/pelvis.  09/11/2017 MRI abdomen FINDINGS: Lower chest: No acute abnormality at the lung bases. Hepatobiliary: Normal liver size and configuration. Prominent diffuse hepatic steatosis. No liver mass. Normal gallbladder with no cholelithiasis. No biliary ductal dilatation. Common bile duct diameter 2 mm. No evidence of choledocholithiasis. Pancreas: Status post distal pancreatectomy. At the pancreatectomy margin, there is an  irregular rounded 3.9 x 2.1 x 2.7 cm collection with mixed T1 and T2 signal intensity and no convincing enhancement or significant wall thickening (series 1105/image 61). No additional pancreatic lesions. No pancreatic duct dilation. There is mild fullness of the pancreatic head with associated mild pancreatic and peripancreatic edema (series 3/image  75), which may indicate a mild acute pancreatitis. No pancreas divisum. Spleen: Surgically absent. Adrenals/Urinary Tract: Normal adrenals. No hydronephrosis. Simple 0.9 cm upper right renal cyst. No suspicious renal masses. Stomach/Bowel: Normal non-distended stomach. Visualized small and large bowel is normal caliber, with no bowel wall thickening. Vascular/Lymphatic: Normal caliber abdominal aorta. Patent portal, hepatic and renal veins. Top-normal peripancreatic 0.9 cm node (series 1104/image 61) is not appreciably changed compared to the preoperative MRI of 09/11/2017. No pathologically enlarged lymph nodes in the abdomen. Other: No ascites. Musculoskeletal: No aggressive appearing focal osseous lesions. IMPRESSION: 1. Nonenhancing irregular rounded 3.9 x 2.1 x 2.7 cm complex collection at the pancreatectomy margin, most suggestive of a pseudocyst or postoperative collection. Findings are not favored to represent local tumor recurrence. Suggest follow-up MRI abdomen without and with IV contrast in 3 months. 2. Mild pancreatic and peripancreatic edema in the pancreatic head, which may indicate a mild acute pancreatitis. 3. No findings of metastatic disease in the abdomen. No biliary or pancreatic duct dilation. 4. Prominent diffuse hepatic steatosis. Electronically Signed   By: Ilona Sorrel M.D.   On: 06/25/2018 08:22   Ct Abdomen Pelvis W Contrast  Result Date: 06/25/2018 CLINICAL DATA:  Abdominal pain and nausea for 1 day. Surgery for resection of 4 cm solid pseudo papillary neoplasm of the distal pancreas October 2019. Patient states symptoms are similar to  prior to surgery. EXAM: CT ABDOMEN AND PELVIS WITH CONTRAST TECHNIQUE: Multidetector CT imaging of the abdomen and pelvis was performed using the standard protocol following bolus administration of intravenous contrast. CONTRAST:  172mL OMNIPAQUE IOHEXOL 300 MG/ML  SOLN COMPARISON:  CT of the abdomen and pelvis 11/03/2017 FINDINGS: Lower chest: Mild dependent atelectasis is present at the lung bases. No focal nodule, mass, or airspace disease is present. Heart size is normal. No significant pleural or pericardial effusion is present. Hepatobiliary: There is diffuse fatty infiltration liver. No discrete lesions are present. Common bile duct and gallbladder are normal. Pancreas: Distal pancreas is resected. There is a low-density lesion at the resection site measuring 2.3 x 2.6 x 3.8 cm. Pancreatic head and proximal body are normal. No inflammatory changes are associated. Spleen: Surgically absent Adrenals/Urinary Tract: Adrenal glands are normal bilaterally. Kidneys and ureters are within normal limits. The urinary bladder is within normal limits. Stomach/Bowel: The stomach and duodenum are within normal limits. Small bowel is unremarkable. Terminal ileum is within normal limits. The appendix is visualized and normal. The ascending and transverse colon are normal. The descending and sigmoid colon are normal. Vascular/Lymphatic: Atherosclerotic calcifications are again noted in the distal aorta. There is no aneurysm. Reproductive: Prostate is unremarkable. Other: Vertebral body heights alignment are maintained. Mild degenerative changes are noted at L5-S1. No focal lytic or blastic lesions are present. Bony pelvis is within normal limits. Hips are located and normal. Musculoskeletal: Vertebral body heights alignment are maintained. No focal lytic or blastic lesions are present. Degenerative changes are noted at L5-S1. Bony pelvis is within normal limits. Hips are located and normal. IMPRESSION: 1. Low-density cystic  lesion at the site of the pancreatic resection measures 2.3 x 2.6 x 3.8 cm. No definite solid components are present. This is nonspecific and may be postoperative in nature. Recurrent tumor is not excluded. Elective MRI of the pancreas without and with contrast could be used for further evaluation. 2. No acute abnormality. 3. Hepatic steatosis. 4.  Aortic Atherosclerosis (ICD10-I70.0). Electronically Signed   By: San Morelle M.D.   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 Patient presents with abdominal pain and nausea for 1 day. Low-attenuation focus at the resection site on CT performed earlier today. EXAM: MRI ABDOMEN WITHOUT AND WITH CONTRAST TECHNIQUE: Multiplanar multisequence MR imaging of the abdomen was performed both before and after the administration of intravenous contrast. CONTRAST:  10 cc Gadavist IV. COMPARISON:  06/25/2018 CT abdomen/pelvis.  09/11/2017 MRI abdomen FINDINGS: Lower chest: No acute abnormality at the lung bases. Hepatobiliary: Normal liver size and configuration. Prominent diffuse hepatic steatosis. No liver mass. Normal gallbladder with no cholelithiasis. No biliary ductal dilatation. Common bile duct diameter 2 mm. No evidence of choledocholithiasis. Pancreas: Status post distal pancreatectomy. At the pancreatectomy margin, there is an irregular rounded 3.9 x 2.1 x 2.7 cm collection with mixed T1 and T2 signal intensity and no convincing enhancement or significant wall thickening (series 1105/image 61). No additional pancreatic lesions. No pancreatic duct dilation. There is mild fullness of the pancreatic head with associated mild pancreatic and peripancreatic edema (series 3/image 46), which may indicate a mild acute pancreatitis. No pancreas divisum. Spleen: Surgically absent. Adrenals/Urinary Tract:  Normal adrenals. No hydronephrosis. Simple 0.9 cm upper right renal cyst. No suspicious renal masses. Stomach/Bowel: Normal non-distended stomach. Visualized small and large bowel is normal caliber, with no bowel wall thickening. Vascular/Lymphatic: Normal caliber abdominal aorta. Patent portal, hepatic and renal veins. Top-normal peripancreatic 0.9 cm node (series 1104/image 61) is not appreciably changed compared to the preoperative MRI of 09/11/2017. No pathologically enlarged lymph nodes in the abdomen. Other: No ascites. Musculoskeletal: No aggressive appearing focal osseous lesions. IMPRESSION: 1. Nonenhancing irregular rounded 3.9 x 2.1 x 2.7 cm complex collection at the pancreatectomy margin, most suggestive of a pseudocyst or postoperative collection. Findings are not favored to represent local tumor recurrence. Suggest follow-up MRI abdomen without and with IV contrast in 3 months. 2. Mild pancreatic and peripancreatic edema in the pancreatic head, which may indicate a mild acute pancreatitis. 3. No findings of metastatic disease in the abdomen. No biliary or pancreatic duct dilation. 4. Prominent diffuse hepatic steatosis. Electronically Signed   By: Ilona Sorrel M.D.   On: 06/25/2018 08:22    Pending Labs Unresulted Labs (From admission, onward)    Start     Ordered   07/02/18 0500  Creatinine, serum  (enoxaparin (LOVENOX)    CrCl >/= 30 ml/min)  Weekly,   R    Comments:  while on enoxaparin therapy    06/25/18 0716   06/26/18 0500  Comprehensive metabolic panel  Tomorrow morning,   R     06/25/18 0716   06/26/18 0500  CBC  Tomorrow morning,   R     06/25/18 0716   06/26/18 0500  Protime-INR  Tomorrow morning,   R     06/25/18 0716   06/26/18 0500  APTT  Tomorrow morning,   R     06/25/18 0716   06/25/18 0713  HIV antibody (Routine Testing)  Once,   R     06/25/18 0716   06/25/18 0713  CBC  (enoxaparin (LOVENOX)    CrCl >/= 30 ml/min)  Once,   R    Comments:  Baseline for enoxaparin  therapy IF NOT ALREADY DRAWN.  Notify MD if PLT < 100 K.    06/25/18 0716   06/25/18 0713  Creatinine, serum  (enoxaparin (LOVENOX)    CrCl >/=  30 ml/min)  Once,   R    Comments:  Baseline for enoxaparin therapy IF NOT ALREADY DRAWN.    06/25/18 0716          Vitals/Pain Today's Vitals   06/25/18 0532 06/25/18 0553 06/25/18 0600 06/25/18 0754  BP:  117/64 113/77 121/71  Pulse:  71 79 77  Resp:  16  16  Temp:      TempSrc:      SpO2:  92% 94% 95%  Weight:      Height:      PainSc: 5        Isolation Precautions No active isolations  Medications Medications  pantoprazole (PROTONIX) EC tablet 40 mg (has no administration in time range)  enoxaparin (LOVENOX) injection 40 mg (has no administration in time range)  dextrose 5 %-0.45 % sodium chloride infusion (has no administration in time range)  acetaminophen (TYLENOL) tablet 650 mg (has no administration in time range)    Or  acetaminophen (TYLENOL) suppository 650 mg (has no administration in time range)  senna-docusate (Senokot-S) tablet 1 tablet (has no administration in time range)  bisacodyl (DULCOLAX) EC tablet 5 mg (has no administration in time range)  sodium phosphate (FLEET) 7-19 GM/118ML enema 1 enema (has no administration in time range)  ondansetron (ZOFRAN) tablet 4 mg (has no administration in time range)    Or  ondansetron (ZOFRAN) injection 4 mg (has no administration in time range)  hydrALAZINE (APRESOLINE) injection 10 mg (has no administration in time range)  polyethylene glycol (MIRALAX / GLYCOLAX) packet 17 g (has no administration in time range)  oxyCODONE (Oxy IR/ROXICODONE) immediate release tablet 10 mg (has no administration in time range)  morphine 4 MG/ML injection 4 mg (has no administration in time range)  HYDROmorphone (DILAUDID) injection 1 mg (1 mg Intravenous Given 06/25/18 0236)  ondansetron (ZOFRAN) injection 4 mg (4 mg Intravenous Given 06/25/18 0236)  sodium chloride 0.9 % bolus 1,000  mL (0 mLs Intravenous Stopped 06/25/18 0438)  iohexol (OMNIPAQUE) 300 MG/ML solution 100 mL (100 mLs Intravenous Contrast Given 06/25/18 0427)  HYDROmorphone (DILAUDID) injection 1 mg (1 mg Intravenous Given 06/25/18 0506)  LORazepam (ATIVAN) injection 1 mg (1 mg Intravenous Given 06/25/18 0616)  gadobutrol (GADAVIST) 1 MMOL/ML injection 10 mL (10 mLs Intravenous Contrast Given 06/25/18 0725)    Mobility walks     Focused Assessments Pancreatic cyst   R Recommendations: See Admitting Provider Note  Report given to: Dominica  Additional Notes: .

## 2018-06-25 NOTE — H&P (Signed)
History and Physical    Andre Simmons:[id number] DOB: 16-Oct-1975 DOA: 06/25/2018  PCP: Maryella Shivers, MD Patient coming from: Home  Chief Complaint: Abdominal pain  HPI: Andre Simmons is a 43 y.o. 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  Unable to keep any food down.  Denied any fevers, chills.  Follows Dr. Barry Dienes outpatient.He has not received any postop radiation or chemotherapy.  For the most part he had been doing well after the surgery.  In the ER today his labs were overall unremarkable besides slight relative leukocytosis of 13.4, lipase 27.  Some signs of clinical dehydration.  CT of the abdomen pelvis showed small mass at the pancreatic resection site.  MRI of the abdomen confirmed this mass is either pseudocyst or some postop fluid collection but there is signs of mild acute pancreatitis.  Patient was admitted for further care.  Surgical team was consulted.   Review of Systems: As per HPI otherwise 10 point review of systems negative.  Review of Systems Otherwise negative except as per HPI, including: General: Denies fever, chills, night sweats or unintended weight loss. Resp: Denies cough, wheezing, shortness of breath. Cardiac: Denies chest pain, palpitations, orthopnea, paroxysmal nocturnal dyspnea. GI: Deniesdiarrhea or constipation GU: Denies dysuria, frequency, hesitancy or incontinence MS: Denies muscle aches, joint pain or swelling Neuro: Denies headache, neurologic deficits (focal weakness, numbness, tingling), abnormal gait Psych: Denies anxiety, depression, SI/HI/AVH Skin: Denies new rashes or lesions ID: Denies sick contacts, exotic exposures, travel  Past Medical History:  Diagnosis Date   Arthritis     Chronic back pain    Depression    GERD (gastroesophageal reflux disease)    Knee pain    Pancreatic tumor     Past Surgical History:  Procedure Laterality Date   BACK SURGERY     L4-L5, S1 micro discectomy   EUS N/A 09/25/2017   Procedure: UPPER ENDOSCOPIC ULTRASOUND (EUS) LINEAR;  Surgeon: Milus Banister, MD;  Location: [facility];  Service: Endoscopy;  Laterality: N/A;  Radial and Linear   FINE NEEDLE ASPIRATION  09/25/2017   Procedure: FINE NEEDLE ASPIRATION (FNA) RADIAL;  Surgeon: Milus Banister, MD;  Location: [facility];  Service: Endoscopy;;   KNEE SURGERY Left    x 20 surgeries   LAPAROSCOPIC DISTAL PANCREATECTOMY  11/26/2017   Diagnostic laparoscopy, laparoscopic hand-assisted distal    MOUTH SURGERY     as a child, unsure if wisdom teeth or tonsils   PANCREATECTOMY N/A 11/25/2017   Procedure: LAPAROSCOPIC DISTAL PANCREATECTOMY AND SPLENECTOMY ERAS PATHWAY;  Surgeon: Stark Klein, MD;  Location: [place];  Service: General;  Laterality: N/A;    SOCIAL HISTORY:  reports that he has quit smoking. He has never used smokeless tobacco. He reports current drug use. Drug: Psilocybin. He reports that he does not drink alcohol.  No Known Allergies  FAMILY HISTORY: Family History  Problem Relation Age of Onset   Thyroid cancer Mother    Other Father        MVA at age 51   Esophageal cancer Maternal Grandmother        smoker   Breast cancer Maternal Grandmother    Esophageal cancer Paternal Grandmother    Colon cancer Neg Hx    Rectal cancer Neg Hx  Prior to Admission medications   Medication Sig Start Date End Date Taking? Authorizing Provider  omeprazole (PRILOSEC) 40 MG capsule Take 40 mg by mouth 2 (two) times daily.    Yes [provider]  gabapentin (NEURONTIN) 300 MG capsule Take 1 capsule (300 mg total) by mouth 2 (two) times daily. Patient not taking: Reported on 06/25/2018 12/02/17   Stark Klein, MD  HYDROmorphone  (DILAUDID) 2 MG tablet Take 1-2 tablets (2-4 mg total) by mouth every 3 (three) hours as needed for moderate pain or severe pain. Patient not taking: Reported on 06/25/2018 12/02/17   Stark Klein, MD  methocarbamol (ROBAXIN) 500 MG tablet Take 1 tablet (500 mg total) by mouth 2 (two) times daily as needed for muscle spasms. Patient not taking: Reported on 06/25/2018 03/15/18   Ward, Ozella Almond, PA-C  methylPREDNISolone (MEDROL DOSEPAK) 4 MG TBPK tablet Take as directed on package. Patient not taking: Reported on 06/25/2018 03/15/18   Ward, Ozella Almond, PA-C  oxyCODONE-acetaminophen (PERCOCET/ROXICET) 5-325 MG tablet Take 2 tablets by mouth every 4 (four) hours as needed for severe pain. Patient not taking: Reported on 06/25/2018 03/15/18   Ward, Ozella Almond, PA-C    Physical Exam: Vitals:   06/25/18 0600 06/25/18 0754 06/25/18 0830 06/25/18 0900  BP: 113/77 121/71 105/85 (!) 107/91  Pulse: 79 77 72 66  Resp:  16  16  Temp:      TempSrc:      SpO2: 94% 95% 94% 97%  Weight:      Height:          Constitutional: NAD, calm, comfortable Eyes: PERRL, lids and conjunctivae normal ENMT: Mucous membranes are Dry. Posterior pharynx clear of any exudate or lesions.Normal dentition.  Neck: normal, supple, no masses, no thyromegaly Respiratory: clear to auscultation bilaterally, no wheezing, no crackles. Normal respiratory effort. No accessory muscle use.  Cardiovascular: Regular rate and rhythm, no murmurs / rubs / gallops. No extremity edema. 2+ pedal pulses. No carotid bruits.  Abdomen: no tenderness, no masses palpated. No hepatosplenomegaly. Bowel sounds positive.  Musculoskeletal: no clubbing / cyanosis. No joint deformity upper and lower extremities. Good ROM, no contractures. Normal muscle tone.  Skin: no rashes, lesions, ulcers. No induration Neurologic: CN 2-12 grossly intact. Sensation intact, DTR normal. Strength 5/5 in all 4.  Psychiatric: Normal judgment and insight. Alert and  oriented x 3. Normal mood.     Labs on Admission: I have personally reviewed following labs and imaging studies  CBC: Recent Labs  Lab 06/25/18 0218  WBC 13.4*  NEUTROABS 8.0*  HGB 14.4  HCT 42.7  MCV 94.9  PLT 250*   Basic Metabolic Panel: Recent Labs  Lab 06/25/18 0218  NA 138  K 3.4*  CL 104  CO2 23  GLUCOSE 118*  BUN 13  CREATININE 0.90  CALCIUM 9.3   GFR: Estimated Creatinine Clearance: 139.8 mL/min (by C-G formula based on SCr of 0.9 mg/dL). Liver Function Tests: Recent Labs  Lab 06/25/18 0218  AST 21  ALT 44  ALKPHOS 86  BILITOT 0.7  PROT 8.0  ALBUMIN 4.5   Recent Labs  Lab 06/25/18 0218  LIPASE 27   No results for input(s): AMMONIA in the last 168 hours. Coagulation Profile: No results for input(s): INR, PROTIME in the last 168 hours. Cardiac Enzymes: No results for input(s): CKTOTAL, CKMB, CKMBINDEX, TROPONINI in the last 168 hours. BNP (last 3 results) No results for input(s): PROBNP in the last 8760 hours. HbA1C: No results for input(s): HGBA1C  in the last 72 hours. CBG: Recent Labs  Lab 06/25/18 0804  GLUCAP 92   Lipid Profile: No results for input(s): CHOL, HDL, LDLCALC, TRIG, CHOLHDL, LDLDIRECT in the last 72 hours. Thyroid Function Tests: No results for input(s): TSH, T4TOTAL, FREET4, T3FREE, THYROIDAB in the last 72 hours. Anemia Panel: No results for input(s): VITAMINB12, FOLATE, FERRITIN, TIBC, IRON, RETICCTPCT in the last 72 hours. Urine analysis:    Component Value Date/Time   COLORURINE YELLOW 11/21/2017 0835   APPEARANCEUR CLEAR 11/21/2017 0835   LABSPEC 1.026 11/21/2017 0835   PHURINE 5.0 11/21/2017 0835   GLUCOSEU NEGATIVE 11/21/2017 0835   HGBUR NEGATIVE 11/21/2017 0835   BILIRUBINUR NEGATIVE 11/21/2017 0835   KETONESUR NEGATIVE 11/21/2017 0835   PROTEINUR NEGATIVE 11/21/2017 0835   UROBILINOGEN 0.2 09/19/2009 1400   NITRITE NEGATIVE 11/21/2017 0835   LEUKOCYTESUR NEGATIVE 11/21/2017 0835   Sepsis Labs:  !!!!!!!!!!!!!!!!!!!!!!!!!!!!!!!!!!!!!!!!!!!! @LABRCNTIP (procalcitonin:4,lacticidven:4) )No results found for this or any previous visit (from the past 240 hour(s)).   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 Patient presents with abdominal pain and nausea for 1 day. Low-attenuation focus at the resection site on CT performed earlier today. EXAM: MRI ABDOMEN WITHOUT AND WITH CONTRAST TECHNIQUE: Multiplanar multisequence MR imaging of the abdomen was performed both before and after the administration of intravenous contrast. CONTRAST:  10 cc Gadavist IV. COMPARISON:  06/25/2018 CT abdomen/pelvis.  09/11/2017 MRI abdomen FINDINGS: Lower chest: No acute abnormality at the lung bases. Hepatobiliary: Normal liver size and configuration. Prominent diffuse hepatic steatosis. No liver mass. Normal gallbladder with no cholelithiasis. No biliary ductal dilatation. Common bile duct diameter 2 mm. No evidence of choledocholithiasis. Pancreas: Status post distal pancreatectomy. At the pancreatectomy margin, there is an irregular rounded 3.9 x 2.1 x 2.7 cm collection with mixed T1 and T2 signal intensity and no convincing enhancement or significant wall thickening (series 1105/image 61). No additional pancreatic lesions. No pancreatic duct dilation. There is mild fullness of the pancreatic head with associated mild pancreatic and peripancreatic edema (series 3/image 46), which may indicate a mild acute pancreatitis. No pancreas divisum. Spleen: Surgically absent. Adrenals/Urinary Tract: Normal adrenals. No hydronephrosis. Simple 0.9 cm upper right renal cyst. No suspicious renal masses. Stomach/Bowel: Normal non-distended stomach. Visualized small and large bowel is normal caliber, with no bowel wall thickening. Vascular/Lymphatic: Normal  caliber abdominal aorta. Patent portal, hepatic and renal veins. Top-normal peripancreatic 0.9 cm node (series 1104/image 61) is not appreciably changed compared to the preoperative MRI of 09/11/2017. No pathologically enlarged lymph nodes in the abdomen. Other: No ascites. Musculoskeletal: No aggressive appearing focal osseous lesions. IMPRESSION: 1. Nonenhancing irregular rounded 3.9 x 2.1 x 2.7 cm complex collection at the pancreatectomy margin, most suggestive of a pseudocyst or postoperative collection. Findings are not favored to represent local tumor recurrence. Suggest follow-up MRI abdomen without and with IV contrast in 3 months. 2. Mild pancreatic and peripancreatic edema in the pancreatic head, which may indicate a mild acute pancreatitis. 3. No findings of metastatic disease in the abdomen. No biliary or pancreatic duct dilation. 4. Prominent diffuse hepatic steatosis. Electronically Signed   By: Ilona Sorrel M.D.   On: 06/25/2018 08:22   Ct Abdomen Pelvis W Contrast  Result Date: 06/25/2018 CLINICAL DATA:  Abdominal pain and nausea for 1 day. Surgery for resection of 4 cm solid pseudo papillary neoplasm of the distal pancreas  October 2019. Patient states symptoms are similar to prior to surgery. EXAM: CT ABDOMEN AND PELVIS WITH CONTRAST TECHNIQUE: Multidetector CT imaging of the abdomen and pelvis was performed using the standard protocol following bolus administration of intravenous contrast. CONTRAST:  157mL OMNIPAQUE IOHEXOL 300 MG/ML  SOLN COMPARISON:  CT of the abdomen and pelvis 11/03/2017 FINDINGS: Lower chest: Mild dependent atelectasis is present at the lung bases. No focal nodule, mass, or airspace disease is present. Heart size is normal. No significant pleural or pericardial effusion is present. Hepatobiliary: There is diffuse fatty infiltration liver. No discrete lesions are present. Common bile duct and gallbladder are normal. Pancreas: Distal pancreas is resected. There is a  low-density lesion at the resection site measuring 2.3 x 2.6 x 3.8 cm. Pancreatic head and proximal body are normal. No inflammatory changes are associated. Spleen: Surgically absent Adrenals/Urinary Tract: Adrenal glands are normal bilaterally. Kidneys and ureters are within normal limits. The urinary bladder is within normal limits. Stomach/Bowel: The stomach and duodenum are within normal limits. Small bowel is unremarkable. Terminal ileum is within normal limits. The appendix is visualized and normal. The ascending and transverse colon are normal. The descending and sigmoid colon are normal. Vascular/Lymphatic: Atherosclerotic calcifications are again noted in the distal aorta. There is no aneurysm. Reproductive: Prostate is unremarkable. Other: Vertebral body heights alignment are maintained. Mild degenerative changes are noted at L5-S1. No focal lytic or blastic lesions are present. Bony pelvis is within normal limits. Hips are located and normal. Musculoskeletal: Vertebral body heights alignment are maintained. No focal lytic or blastic lesions are present. Degenerative changes are noted at L5-S1. Bony pelvis is within normal limits. Hips are located and normal. IMPRESSION: 1. Low-density cystic lesion at the site of the pancreatic resection measures 2.3 x 2.6 x 3.8 cm. No definite solid components are present. This is nonspecific and may be postoperative in nature. Recurrent tumor is not excluded. Elective MRI of the pancreas without and with contrast could be used for further evaluation. 2. No acute abnormality. 3. Hepatic steatosis. 4.  Aortic Atherosclerosis (ICD10-I70.0). Electronically Signed   By: San Morelle M.D.   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 Patient presents with abdominal pain and nausea for 1 day.  Low-attenuation focus at the resection site on CT performed earlier today. EXAM: MRI ABDOMEN WITHOUT AND WITH CONTRAST TECHNIQUE: Multiplanar multisequence MR imaging of the abdomen was performed both before and after the administration of intravenous contrast. CONTRAST:  10 cc Gadavist IV. COMPARISON:  06/25/2018 CT abdomen/pelvis.  09/11/2017 MRI abdomen FINDINGS: Lower chest: No acute abnormality at the lung bases. Hepatobiliary: Normal liver size and configuration. Prominent diffuse hepatic steatosis. No liver mass. Normal gallbladder with no cholelithiasis. No biliary ductal dilatation. Common bile duct diameter 2 mm. No evidence of choledocholithiasis. Pancreas: Status post distal pancreatectomy. At the pancreatectomy margin, there is an irregular rounded 3.9 x 2.1 x 2.7 cm collection with mixed T1 and T2 signal intensity and no convincing enhancement or significant wall thickening (series 1105/image 61). No additional pancreatic lesions. No pancreatic duct dilation. There is mild fullness of the pancreatic head with associated mild pancreatic and peripancreatic edema (series 3/image 46), which may indicate a mild acute pancreatitis. No pancreas divisum. Spleen: Surgically absent. Adrenals/Urinary Tract: Normal adrenals. No hydronephrosis. Simple 0.9 cm upper right renal cyst. No suspicious renal masses. Stomach/Bowel: Normal  non-distended stomach. Visualized small and large bowel is normal caliber, with no bowel wall thickening. Vascular/Lymphatic: Normal caliber abdominal aorta. Patent portal, hepatic and renal veins. Top-normal peripancreatic 0.9 cm node (series 1104/image 61) is not appreciably changed compared to the preoperative MRI of 09/11/2017. No pathologically enlarged lymph nodes in the abdomen. Other: No ascites. Musculoskeletal: No aggressive appearing focal osseous lesions. IMPRESSION: 1. Nonenhancing irregular rounded 3.9 x 2.1 x 2.7 cm complex collection at the pancreatectomy margin, most  suggestive of a pseudocyst or postoperative collection. Findings are not favored to represent local tumor recurrence. Suggest follow-up MRI abdomen without and with IV contrast in 3 months. 2. Mild pancreatic and peripancreatic edema in the pancreatic head, which may indicate a mild acute pancreatitis. 3. No findings of metastatic disease in the abdomen. No biliary or pancreatic duct dilation. 4. Prominent diffuse hepatic steatosis. Electronically Signed   By: Ilona Sorrel M.D.   On: 06/25/2018 08:22     All images have been reviewed by me personally.    Assessment/Plan Principal Problem:   Abdominal pain Active Problems:   Abnormal MRI   Pancreatic lesion   Acute pancreatitis   Chronic back pain   Nausea and vomiting in adult patient   Neuroendocrine tumor of pancreas    Abdominal discomfort, upper abdomen, likely acute pancreatitis Poor oral tolerance - Admit the patient to the hospital for IV fluids and symptomatic management including pain control - CT of the abdomen pelvis showed abnormal lesion in the pancreas, on the MRI it shows 3.9 X2.1 next 2.7 cm complex collection suggestive of pseudocyst versus postop.  Mild acute pancreatitis.  Needs follow-up MRI in about 3 months -General surgery following. -Pain control, IV Dilaudid, oral oxycodone.  Bowel regimen PRN. -Advance diet as tolerated. -General surgery team is following.  History of pancreatic neuroendocrine tumor status post distal pancreatectomy/splenectomy - Follows outpatient general surgery, Dr. Barry Dienes  History of chronic pain -Unclear exactly what pain medications he takes at home but currently getting IV Dilaudid and oxycodone.  GERD -Gabapentin 300 mg twice daily.   DVT prophylaxis: Lovenox Code Status: Full code Family Communication: None Disposition Plan: To be determined Consults called: General surgery Admission status: Observation admission for pain control   Time Spent: 65 minutes.  >50% of the  time was devoted to discussing the patients care, assessment, plan and disposition with other care givers along with counseling the patient about the risks and benefits of treatment.    Izabela Ow Arsenio Loader MD [facility]  If 7PM-7AM, please contact night-coverage [email or web address]  06/25/2018, 12:58 PM

## 2018-06-26 ENCOUNTER — Encounter (HOSPITAL_COMMUNITY): Payer: Self-pay | Admitting: Surgery

## 2018-06-26 DIAGNOSIS — Z808 Family history of malignant neoplasm of other organs or systems: Secondary | ICD-10-CM | POA: Diagnosis not present

## 2018-06-26 DIAGNOSIS — R1012 Left upper quadrant pain: Secondary | ICD-10-CM | POA: Diagnosis not present

## 2018-06-26 DIAGNOSIS — Z79899 Other long term (current) drug therapy: Secondary | ICD-10-CM | POA: Diagnosis not present

## 2018-06-26 DIAGNOSIS — R112 Nausea with vomiting, unspecified: Secondary | ICD-10-CM | POA: Diagnosis not present

## 2018-06-26 DIAGNOSIS — G8929 Other chronic pain: Secondary | ICD-10-CM | POA: Diagnosis present

## 2018-06-26 DIAGNOSIS — Z9081 Acquired absence of spleen: Secondary | ICD-10-CM | POA: Diagnosis not present

## 2018-06-26 DIAGNOSIS — Z8507 Personal history of malignant neoplasm of pancreas: Secondary | ICD-10-CM | POA: Diagnosis not present

## 2018-06-26 DIAGNOSIS — R9389 Abnormal findings on diagnostic imaging of other specified body structures: Secondary | ICD-10-CM | POA: Diagnosis not present

## 2018-06-26 DIAGNOSIS — R1013 Epigastric pain: Secondary | ICD-10-CM | POA: Diagnosis present

## 2018-06-26 DIAGNOSIS — K219 Gastro-esophageal reflux disease without esophagitis: Secondary | ICD-10-CM | POA: Diagnosis present

## 2018-06-26 DIAGNOSIS — M1990 Unspecified osteoarthritis, unspecified site: Secondary | ICD-10-CM | POA: Diagnosis present

## 2018-06-26 DIAGNOSIS — K859 Acute pancreatitis without necrosis or infection, unspecified: Secondary | ICD-10-CM | POA: Diagnosis present

## 2018-06-26 DIAGNOSIS — D3A8 Other benign neuroendocrine tumors: Secondary | ICD-10-CM | POA: Diagnosis not present

## 2018-06-26 DIAGNOSIS — Z800 Family history of malignant neoplasm of digestive organs: Secondary | ICD-10-CM | POA: Diagnosis not present

## 2018-06-26 DIAGNOSIS — Z87891 Personal history of nicotine dependence: Secondary | ICD-10-CM | POA: Diagnosis not present

## 2018-06-26 DIAGNOSIS — K589 Irritable bowel syndrome without diarrhea: Secondary | ICD-10-CM | POA: Diagnosis present

## 2018-06-26 DIAGNOSIS — Z90411 Acquired partial absence of pancreas: Secondary | ICD-10-CM | POA: Diagnosis not present

## 2018-06-26 DIAGNOSIS — K869 Disease of pancreas, unspecified: Secondary | ICD-10-CM | POA: Diagnosis not present

## 2018-06-26 DIAGNOSIS — M549 Dorsalgia, unspecified: Secondary | ICD-10-CM | POA: Diagnosis not present

## 2018-06-26 LAB — COMPREHENSIVE METABOLIC PANEL
ALT: 35 U/L (ref 0–44)
AST: 15 U/L (ref 15–41)
Albumin: 3.9 g/dL (ref 3.5–5.0)
Alkaline Phosphatase: 81 U/L (ref 38–126)
Anion gap: 7 (ref 5–15)
BUN: 8 mg/dL (ref 6–20)
CO2: 27 mmol/L (ref 22–32)
Calcium: 8.8 mg/dL — ABNORMAL LOW (ref 8.9–10.3)
Chloride: 104 mmol/L (ref 98–111)
Creatinine, Ser: 0.86 mg/dL (ref 0.61–1.24)
GFR calc Af Amer: >60 mL/min (ref >60)
GFR calc non Af Amer: >60 mL/min (ref >60)
Glucose, Bld: 69 mg/dL — ABNORMAL LOW (ref 70–99)
Potassium: 3.7 mmol/L (ref 3.5–5.1)
Sodium: 138 mmol/L (ref 135–145)
Total Bilirubin: 0.7 mg/dL (ref 0.3–1.2)
Total Protein: 7 g/dL (ref 6.5–8.1)

## 2018-06-26 LAB — CBC
HCT: 42.2 % (ref 39.0–52.0)
Hemoglobin: 13.6 g/dL (ref 13.0–17.0)
MCH: 31.3 pg (ref 26.0–34.0)
MCHC: 32.2 g/dL (ref 30.0–36.0)
MCV: 97 fL (ref 80.0–100.0)
Platelets: 497 10*3/uL — ABNORMAL HIGH (ref 150–400)
RBC: 4.35 MIL/uL (ref 4.22–5.81)
RDW: 13.6 % (ref 11.5–15.5)
WBC: 12.9 10*3/uL — ABNORMAL HIGH (ref 4.0–10.5)
nRBC: 0 % (ref 0.0–0.2)

## 2018-06-26 LAB — GLUCOSE, CAPILLARY: Glucose-Capillary: 111 mg/dL — ABNORMAL HIGH (ref 70–99)

## 2018-06-26 LAB — APTT: aPTT: 30 seconds (ref 24–36)

## 2018-06-26 LAB — PROTIME-INR
INR: 1 (ref 0.8–1.2)
Prothrombin Time: 13.2 seconds (ref 11.4–15.2)

## 2018-06-26 LAB — LIPASE, BLOOD: Lipase: 48 U/L (ref 11–51)

## 2018-06-26 LAB — HIV ANTIBODY (ROUTINE TESTING W REFLEX): HIV Screen 4th Generation wRfx: NONREACTIVE

## 2018-06-26 MED ORDER — GABAPENTIN 300 MG PO CAPS
300.0000 mg | ORAL_CAPSULE | Freq: Two times a day (BID) | ORAL | Status: DC
  Administered 2018-06-26 – 2018-06-29 (×7): 300 mg via ORAL
  Filled 2018-06-26 (×7): qty 1

## 2018-06-26 MED ORDER — PANTOPRAZOLE SODIUM 40 MG PO TBEC
40.0000 mg | DELAYED_RELEASE_TABLET | Freq: Two times a day (BID) | ORAL | Status: DC
  Administered 2018-06-26 – 2018-06-30 (×8): 40 mg via ORAL
  Filled 2018-06-26 (×8): qty 1

## 2018-06-26 MED ORDER — ACETAMINOPHEN 500 MG PO TABS
500.0000 mg | ORAL_TABLET | Freq: Three times a day (TID) | ORAL | Status: DC
  Administered 2018-06-26 – 2018-06-29 (×8): 500 mg via ORAL
  Filled 2018-06-26 (×9): qty 1

## 2018-06-26 MED ORDER — ACETAMINOPHEN 500 MG PO TABS: 1000.0000 mg | ORAL_TABLET | Freq: Three times a day (TID) | ORAL | Status: DC

## 2018-06-26 NOTE — Progress Notes (Signed)
    CC: Abdominal pain nausea and vomiting  Subjective: Patient continues to complain of abdominal pain/6/10.  He is requiring a fair amount of narcotic pain medication.  He says the pain is like what he had with his original symptoms.  He is denying that this is back pain.  Pain again is directly under the xiphoid midepigastric area.  He had some nausea last evening that Zofran.  None this a.m.  Objective: Vital signs in last 24 hours: Temp:  [98.4 F (36.9 C)-98.7 F (37.1 C)] 98.5 F (36.9 C) (05/01 0521) Pulse Rate:  [56-68] 68 (05/01 0521) Resp:  [16-20] 20 (05/01 0521) BP: (106-116)/(75-83) 114/83 (05/01 0521) SpO2:  [91 %-96 %] 94 % (05/01 0521) Last BM Date: 06/24/18 920 p.o. placed on a regular diet this a.m. Voided x5 No BM recorded. Afebrile vital signs are stable CMP is normal WBC is still mildly elevated at 12.9. Pain control: Vicodin 5/325 x 3 yesterday/2 this a.m. Dilaudid 5 mg IV yesterday/3 mg this a.m. Zofran x1, Ativan x1 Intake/Output from previous day: 04/30 0701 - 05/01 0700 In: 2440.2 [P.O.:920; I.V.:1520.2] Out: -  Intake/Output this shift: Total I/O In: 120 [P.O.:120] Out: -   General appearance: alert, cooperative and no distress GI: Soft, complaining of tenderness and pain under the xiphoid process.  It epigastric area.  Lab Results:  Recent Labs    06/25/18 0218 06/26/18 0352  WBC 13.4* 12.9*  HGB 14.4 13.6  HCT 42.7 42.2  PLT 523* 497*    BMET Recent Labs    06/25/18 0218 06/26/18 0352  NA 138 138  K 3.4* 3.7  CL 104 104  CO2 23 27  GLUCOSE 118* 69*  BUN 13 8  CREATININE 0.90 0.86  CALCIUM 9.3 8.8*   PT/INR Recent Labs    06/26/18 0352  LABPROT 13.2  INR 1.0    Recent Labs  Lab 06/25/18 0218 06/26/18 0352  AST 21 15  ALT 44 35  ALKPHOS 86 81  BILITOT 0.7 0.7  PROT 8.0 7.0  ALBUMIN 4.5 3.9     Lipase     Component Value Date/Time   LIPASE 27 06/25/2018 0218     Medications: . enoxaparin (LOVENOX)  injection  40 mg Subcutaneous Q24H  . pantoprazole  40 mg Oral Daily    Assessment/Plan Multiple back surgeries with some back pain GERD   Abdominal pain  T2N0M0 pancreatic neuroendocrine tumor, S/p diagnostic laparoscopy, laparoscopic hand-assisted distal pancreatectomy/splenectomy 11/25/2017 with Dr. Stark Klein 3.9 x 2.1 x 2.7 pancreatic fluid collection, fullness of the pancreatic head with mild peripancreatic edema/possible acute pancreatitis  FEN: N.p.o./IV fluids ID: None DVT: SCDs Follow-up: Dr. Stark Klein   Plan: Dr. Johney Maine has reviewed the CT/MRI.  There is no evidence of bowel obstruction abscess or other major concern.  Small fluid collection along staple line of distal pancreatectomy.  No evidence of any major pancreatitis or inflammation to my view.  Looks like a pseudocyst.  Skeptical of any major deterioration in duration.  It is small.  I would hold off on doing any drainage or anything more aggressive at this time.  I am getting him a follow up for Monday May 4 @4PM  if he goes home.  If not we will try to get Dr. Barry Dienes to see in the hospital Monday.        LOS: 0 days    Andre Simmons 06/26/2018 [phone]

## 2018-06-26 NOTE — Progress Notes (Addendum)
PROGRESS NOTE    Andre Simmons  VQQ:[id number] DOB: 1975/12/02 DOA: 06/25/2018 PCP: Maryella Shivers, MD   Brief Narrative:  HPI On 06/25/2018 by Dr. Gerlean Ren Andre Simmons is a 43 y.o. 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  Unable to keep any food down.  Denied any fevers, chills.  Follows Dr. Barry Dienes outpatient.He has not received any postop radiation or chemotherapy.  For the most part he had been doing well after the surgery.  Assessment & Plan   Abdominal pain likely acute pancreatitis -Unable to tolerate much oral intake.  Did have nausea yesterday evening and received Zofran -Lipsase and LFTs WNL -Continues to need IV Dilaudid for pain control -CT abdomen pelvis showed abnormal lesion in the pancreas.   -MRI shows 3.9 x 2.1 x 2.7 cm complex collection at the pancreatectomy, suggestive of pseudocyst or postoperative collection.  Suggest follow-up MRI abdomen without and with IV contrast in 3 months.  Mild pancreatic and peripancreatic edema indicative of mild acute pancreatitis.  No findings of metastatic disease in the abdomen. -General surgery consulted and appreciated -Continue IV Dilaudid for pain control as well as antiemetics. Unfortunately, pain not controlled with oral hydrocodone.  -Will place patient on clear liquid diet and advance as tolerated -Discussed with general surgery, Dr. Barry Dienes will not be available until next week, appointment set with her as an outpatient if patient is to be discharged this weekend.  No surgical intervention or drainage needed at this time.  Continue to treat as acute pancreatitis.  History of pancreatic neuroendocrine tumor -Status post distal pancreatectomy and splenectomy -Treatment  and plan as above, patient will need to follow-up with Dr. Barry Dienes  History of chronic pain -Patient states that he threw away all of his home narcotics given that his children are staying with him.  He feels it only Dilaudid and OxyContin have helped him.  Not feel that Percocet or hydrocodone help much. -Checked [place] controlled database.  Patient did in fact receive Dilaudid from Dr. Barry Dienes in the past, and is recently received oxycodone acetaminophen in February 2020 from Dr. Trenton Gammon. -Patient may benefit from outpatient pain clinic once pancreatitis has been approved and resolved.  GERD -Continue PPI  DVT Prophylaxis  Lovenox  Code Status: Full  Family Communication: None at bedside  Disposition Plan: Observation. Patient will likely require an additional 1-2 days hospital stay due to pain control/management and acute pancreatitis. Suspect home when stable.   Consultants General surgery  Procedures  None  Antibiotics   Anti-infectives (From admission, onward)   None      Subjective:   Andre Simmons seen and examined today.  Patient continues to have abdominal pain but states it is controlled with the Dilaudid.  He states that the hydrocodone does not touch his pain.  Feels that Dr. Barry Dienes gave him a great combination of oral Dilaudid as well as long-acting oxycodone along with antinausea medications.  Patient does admit to having nausea yesterday evening, without vomiting.  Currently denies chest pain, shortness of breath, diarrhea or constipation, dizziness or headache.  Patient states that he did have some leftover pain medications however threw them away as he has his children staying at home with him.  Objective:   Vitals:   06/25/18 1328 06/25/18 2158 06/26/18 0521 06/26/18  1013  BP: 106/83 116/75 114/83 125/86  Pulse: 68 (!) 56 68 (!) 59  Resp: 16 20 20 16   Temp: 98.7 F (37.1 C) 98.4 F (36.9 C) 98.5 F (36.9 C) 98.2 F (36.8 C)  TempSrc: Oral Oral Oral Oral    SpO2: 96% 91% 94% 98%  Weight:      Height:        Intake/Output Summary (Last 24 hours) at 06/26/2018 1058 Last data filed at 06/26/2018 1014 Gross per 24 hour  Intake 2940.75 ml  Output --  Net 2940.75 ml   Filed Weights   06/25/18 0148  Weight: 102.1 kg    Exam  General: Well developed, well nourished, NAD, appears stated age  48: NCAT,mucous membranes moist.   Neck: Supple  Cardiovascular: S1 S2 auscultated,RRR, no murmur  Respiratory: Clear to auscultation bilaterally   Abdomen: Soft, epigastric TTP, nondistended, + bowel sounds  Extremities: warm dry without cyanosis clubbing or edema  Neuro: AAOx3, nonfocal  Psych: Appropriate mood and affect   Data Reviewed: I have personally reviewed following labs and imaging studies  CBC: Recent Labs  Lab 06/25/18 0218 06/26/18 0352  WBC 13.4* 12.9*  NEUTROABS 8.0*  --   HGB 14.4 13.6  HCT 42.7 42.2  MCV 94.9 97.0  PLT 523* 637*   Basic Metabolic Panel: Recent Labs  Lab 06/25/18 0218 06/26/18 0352  NA 138 138  K 3.4* 3.7  CL 104 104  CO2 23 27  GLUCOSE 118* 69*  BUN 13 8  CREATININE 0.90 0.86  CALCIUM 9.3 8.8*   GFR: Estimated Creatinine Clearance: 146.3 mL/min (by C-G formula based on SCr of 0.86 mg/dL). Liver Function Tests: Recent Labs  Lab 06/25/18 0218 06/26/18 0352  AST 21 15  ALT 44 35  ALKPHOS 86 81  BILITOT 0.7 0.7  PROT 8.0 7.0  ALBUMIN 4.5 3.9   Recent Labs  Lab 06/25/18 0218  LIPASE 27   No results for input(s): AMMONIA in the last 168 hours. Coagulation Profile: Recent Labs  Lab 06/26/18 0352  INR 1.0   Cardiac Enzymes: No results for input(s): CKTOTAL, CKMB, CKMBINDEX, TROPONINI in the last 168 hours. BNP (last 3 results) No results for input(s): PROBNP in the last 8760 hours. HbA1C: No results for input(s): HGBA1C in the last 72 hours. CBG: Recent Labs  Lab 06/25/18 0804 06/26/18 0817  GLUCAP 92 111*   Lipid Profile: No results for input(s): CHOL, HDL,  LDLCALC, TRIG, CHOLHDL, LDLDIRECT in the last 72 hours. Thyroid Function Tests: No results for input(s): TSH, T4TOTAL, FREET4, T3FREE, THYROIDAB in the last 72 hours. Anemia Panel: No results for input(s): VITAMINB12, FOLATE, FERRITIN, TIBC, IRON, RETICCTPCT in the last 72 hours. Urine analysis:    Component Value Date/Time   COLORURINE YELLOW 11/21/2017 0835   APPEARANCEUR CLEAR 11/21/2017 0835   LABSPEC 1.026 11/21/2017 0835   PHURINE 5.0 11/21/2017 0835   GLUCOSEU NEGATIVE 11/21/2017 0835   HGBUR NEGATIVE 11/21/2017 0835   BILIRUBINUR NEGATIVE 11/21/2017 0835   KETONESUR NEGATIVE 11/21/2017 0835   PROTEINUR NEGATIVE 11/21/2017 0835   UROBILINOGEN 0.2 09/19/2009 1400   NITRITE NEGATIVE 11/21/2017 0835   LEUKOCYTESUR NEGATIVE 11/21/2017 0835   Sepsis Labs: @LABRCNTIP (procalcitonin:4,lacticidven:4)  )No results found for this or any previous visit (from the past 240 hour(s)).    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 Patient presents with abdominal  pain and nausea for 1 day. Low-attenuation focus at the resection site on CT performed earlier today. EXAM: MRI ABDOMEN WITHOUT AND WITH CONTRAST TECHNIQUE: Multiplanar multisequence MR imaging of the abdomen was performed both before and after the administration of intravenous contrast. CONTRAST:  10 cc Gadavist IV. COMPARISON:  06/25/2018 CT abdomen/pelvis.  09/11/2017 MRI abdomen FINDINGS: Lower chest: No acute abnormality at the lung bases. Hepatobiliary: Normal liver size and configuration. Prominent diffuse hepatic steatosis. No liver mass. Normal gallbladder with no cholelithiasis. No biliary ductal dilatation. Common bile duct diameter 2 mm. No evidence of choledocholithiasis. Pancreas: Status post distal pancreatectomy. At the pancreatectomy margin, there is an  irregular rounded 3.9 x 2.1 x 2.7 cm collection with mixed T1 and T2 signal intensity and no convincing enhancement or significant wall thickening (series 1105/image 61). No additional pancreatic lesions. No pancreatic duct dilation. There is mild fullness of the pancreatic head with associated mild pancreatic and peripancreatic edema (series 3/image 46), which may indicate a mild acute pancreatitis. No pancreas divisum. Spleen: Surgically absent. Adrenals/Urinary Tract: Normal adrenals. No hydronephrosis. Simple 0.9 cm upper right renal cyst. No suspicious renal masses. Stomach/Bowel: Normal non-distended stomach. Visualized small and large bowel is normal caliber, with no bowel wall thickening. Vascular/Lymphatic: Normal caliber abdominal aorta. Patent portal, hepatic and renal veins. Top-normal peripancreatic 0.9 cm node (series 1104/image 61) is not appreciably changed compared to the preoperative MRI of 09/11/2017. No pathologically enlarged lymph nodes in the abdomen. Other: No ascites. Musculoskeletal: No aggressive appearing focal osseous lesions. IMPRESSION: 1. Nonenhancing irregular rounded 3.9 x 2.1 x 2.7 cm complex collection at the pancreatectomy margin, most suggestive of a pseudocyst or postoperative collection. Findings are not favored to represent local tumor recurrence. Suggest follow-up MRI abdomen without and with IV contrast in 3 months. 2. Mild pancreatic and peripancreatic edema in the pancreatic head, which may indicate a mild acute pancreatitis. 3. No findings of metastatic disease in the abdomen. No biliary or pancreatic duct dilation. 4. Prominent diffuse hepatic steatosis. Electronically Signed   By: Ilona Sorrel M.D.   On: 06/25/2018 08:22   Ct Abdomen Pelvis W Contrast  Result Date: 06/25/2018 CLINICAL DATA:  Abdominal pain and nausea for 1 day. Surgery for resection of 4 cm solid pseudo papillary neoplasm of the distal pancreas October 2019. Patient states symptoms are similar to  prior to surgery. EXAM: CT ABDOMEN AND PELVIS WITH CONTRAST TECHNIQUE: Multidetector CT imaging of the abdomen and pelvis was performed using the standard protocol following bolus administration of intravenous contrast. CONTRAST:  135mL OMNIPAQUE IOHEXOL 300 MG/ML  SOLN COMPARISON:  CT of the abdomen and pelvis 11/03/2017 FINDINGS: Lower chest: Mild dependent atelectasis is present at the lung bases. No focal nodule, mass, or airspace disease is present. Heart size is normal. No significant pleural or pericardial effusion is present. Hepatobiliary: There is diffuse fatty infiltration liver. No discrete lesions are present. Common bile duct and gallbladder are normal. Pancreas: Distal pancreas is resected. There is a low-density lesion at the resection site measuring 2.3 x 2.6 x 3.8 cm. Pancreatic head and proximal body are normal. No inflammatory changes are associated. Spleen: Surgically absent Adrenals/Urinary Tract: Adrenal glands are normal bilaterally. Kidneys and ureters are within normal limits. The urinary bladder is within normal limits. Stomach/Bowel: The stomach and duodenum are within normal limits. Small bowel is unremarkable. Terminal ileum is within normal limits. The appendix is visualized and normal. The ascending and transverse colon are normal. The descending and sigmoid colon are normal. Vascular/Lymphatic:  Atherosclerotic calcifications are again noted in the distal aorta. There is no aneurysm. Reproductive: Prostate is unremarkable. Other: Vertebral body heights alignment are maintained. Mild degenerative changes are noted at L5-S1. No focal lytic or blastic lesions are present. Bony pelvis is within normal limits. Hips are located and normal. Musculoskeletal: Vertebral body heights alignment are maintained. No focal lytic or blastic lesions are present. Degenerative changes are noted at L5-S1. Bony pelvis is within normal limits. Hips are located and normal. IMPRESSION: 1. Low-density cystic  lesion at the site of the pancreatic resection measures 2.3 x 2.6 x 3.8 cm. No definite solid components are present. This is nonspecific and may be postoperative in nature. Recurrent tumor is not excluded. Elective MRI of the pancreas without and with contrast could be used for further evaluation. 2. No acute abnormality. 3. Hepatic steatosis. 4.  Aortic Atherosclerosis (ICD10-I70.0). Electronically Signed   By: San Morelle M.D.   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 Patient presents with abdominal pain and nausea for 1 day. Low-attenuation focus at the resection site on CT performed earlier today. EXAM: MRI ABDOMEN WITHOUT AND WITH CONTRAST TECHNIQUE: Multiplanar multisequence MR imaging of the abdomen was performed both before and after the administration of intravenous contrast. CONTRAST:  10 cc Gadavist IV. COMPARISON:  06/25/2018 CT abdomen/pelvis.  09/11/2017 MRI abdomen FINDINGS: Lower chest: No acute abnormality at the lung bases. Hepatobiliary: Normal liver size and configuration. Prominent diffuse hepatic steatosis. No liver mass. Normal gallbladder with no cholelithiasis. No biliary ductal dilatation. Common bile duct diameter 2 mm. No evidence of choledocholithiasis. Pancreas: Status post distal pancreatectomy. At the pancreatectomy margin, there is an irregular rounded 3.9 x 2.1 x 2.7 cm collection with mixed T1 and T2 signal intensity and no convincing enhancement or significant wall thickening (series 1105/image 61). No additional pancreatic lesions. No pancreatic duct dilation. There is mild fullness of the pancreatic head with associated mild pancreatic and peripancreatic edema (series 3/image 46), which may indicate a mild acute pancreatitis. No pancreas divisum. Spleen: Surgically absent. Adrenals/Urinary Tract:  Normal adrenals. No hydronephrosis. Simple 0.9 cm upper right renal cyst. No suspicious renal masses. Stomach/Bowel: Normal non-distended stomach. Visualized small and large bowel is normal caliber, with no bowel wall thickening. Vascular/Lymphatic: Normal caliber abdominal aorta. Patent portal, hepatic and renal veins. Top-normal peripancreatic 0.9 cm node (series 1104/image 61) is not appreciably changed compared to the preoperative MRI of 09/11/2017. No pathologically enlarged lymph nodes in the abdomen. Other: No ascites. Musculoskeletal: No aggressive appearing focal osseous lesions. IMPRESSION: 1. Nonenhancing irregular rounded 3.9 x 2.1 x 2.7 cm complex collection at the pancreatectomy margin, most suggestive of a pseudocyst or postoperative collection. Findings are not favored to represent local tumor recurrence. Suggest follow-up MRI abdomen without and with IV contrast in 3 months. 2. Mild pancreatic and peripancreatic edema in the pancreatic head, which may indicate a mild acute pancreatitis. 3. No findings of metastatic disease in the abdomen. No biliary or pancreatic duct dilation. 4. Prominent diffuse hepatic steatosis. Electronically Signed   By: Ilona Sorrel M.D.   On: 06/25/2018 08:22     Scheduled Meds:  enoxaparin (LOVENOX) injection  40 mg Subcutaneous Q24H   pantoprazole  40 mg Oral Daily   Continuous Infusions:   LOS: 0 days   Time Spent in minutes   45 minutes (greater than 50% of  time spent with patient face to face, as well as reviewing old records, calling consults, and formulating a plan)   Cristal Ford D.O. on 06/26/2018 at 10:58 AM  Between 7am to 7pm - Please see pager noted on [email or web address]  After 7pm go to [email or web address]  And look for the night coverage person covering for me after hours  [facility] Office  [phone]

## 2018-06-27 LAB — GLUCOSE, CAPILLARY: Glucose-Capillary: 78 mg/dL (ref 70–99)

## 2018-06-27 LAB — LIPASE, BLOOD: Lipase: 38 U/L (ref 11–51)

## 2018-06-27 MED ORDER — POLYETHYLENE GLYCOL 3350 17 G PO PACK
17.0000 g | PACK | Freq: Two times a day (BID) | ORAL | Status: DC
  Administered 2018-06-27 – 2018-06-30 (×5): 17 g via ORAL
  Filled 2018-06-27 (×6): qty 1

## 2018-06-27 MED ORDER — NAPHAZOLINE-GLYCERIN 0.012-0.2 % OP SOLN
1.0000 [drp] | Freq: Four times a day (QID) | OPHTHALMIC | Status: DC | PRN
  Administered 2018-06-27: 2 [drp] via OPHTHALMIC
  Filled 2018-06-27: qty 15

## 2018-06-27 MED ORDER — SODIUM CHLORIDE 0.9 % IV SOLN
INTRAVENOUS | Status: DC | PRN
  Administered 2018-06-27: 250 mL via INTRAVENOUS

## 2018-06-27 MED ORDER — HYDROMORPHONE HCL 1 MG/ML IJ SOLN
0.5000 mg | Freq: Once | INTRAMUSCULAR | Status: AC
  Administered 2018-06-27: 0.5 mg via INTRAVENOUS
  Filled 2018-06-27: qty 0.5

## 2018-06-27 NOTE — Progress Notes (Signed)
PROGRESS NOTE    Andre Simmons  QAS:[id number] DOB: 05-22-1975 DOA: 06/25/2018 PCP: Maryella Shivers, MD   Brief Narrative:  HPI On 06/25/2018 by Dr. Gerlean Ren Andre Simmons is a 43 y.o. 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  Unable to keep any food down.  Denied any fevers, chills.  Follows Dr. Barry Dienes outpatient.He has not received any postop radiation or chemotherapy.  For the most part he had been doing well after the surgery.  Interim history Patient admitted with abdominal pain likely acute pancreatitis.  General surgery was consulted and appreciated.  Assessment & Plan   Abdominal pain likely acute pancreatitis -Unable to tolerate much oral intake.  Did have nausea yesterday evening and received Zofran -Lipsase and LFTs WNL -Continues to need IV Dilaudid for pain control -CT abdomen pelvis showed abnormal lesion in the pancreas.   -MRI shows 3.9 x 2.1 x 2.7 cm complex collection at the pancreatectomy, suggestive of pseudocyst or postoperative collection.  Suggest follow-up MRI abdomen without and with IV contrast in 3 months.  Mild pancreatic and peripancreatic edema indicative of mild acute pancreatitis.  No findings of metastatic disease in the abdomen. -General surgery consulted and appreciated -Continue IV Dilaudid for pain control as well as antiemetics. Unfortunately, pain not controlled with oral hydrocodone.  Patient continues to use IV Dilaudid as well as hydrocodone around-the-clock, with no improvement in his symptoms. -Will place patient on clear liquid diet and advance as tolerated -Discussed with general surgery, Dr. Barry Dienes will not be available until next week, appointment set with her as an outpatient if  patient is to be discharged this weekend.  No surgical intervention or drainage needed at this time.  Continue to treat as acute pancreatitis. -General surgery added gabapentin creased PPI to twice daily  History of pancreatic neuroendocrine tumor -Status post distal pancreatectomy and splenectomy -Treatment and plan as above, patient will need to follow-up with Dr. Barry Dienes  History of chronic pain -Patient states that he threw away all of his home narcotics given that his children are staying with him.  He feels it only Dilaudid and OxyContin have helped him.  Not feel that Percocet or hydrocodone help much. -Checked [place] controlled database.  Patient did in fact receive Dilaudid from Dr. Barry Dienes in the past, and is recently received oxycodone acetaminophen in February 2020 from Dr. Trenton Gammon. -Patient may benefit from outpatient pain clinic once pancreatitis has been approved and resolved.  GERD -Continue PPI  DVT Prophylaxis  Lovenox  Code Status: Full  Family Communication: None at bedside  Disposition Plan: Admitted.  Diposition pending improvement in pain and transition to oral medications only.  Consultants General surgery  Procedures  None  Antibiotics   Anti-infectives (From admission, onward)   None      Subjective:   Andre Simmons seen and examined today.  Patient continues to complain of abdominal pain and nausea.  States it has been controlled when he receives his medications on time however yesterday evening it seems that his medications were not coming when he was asking for them.  States his pain is now a 10/10.  He currently denies chest pain or shortness of breath, dizziness or headache.    Objective:   Vitals:   06/26/18 1355 06/26/18 2102 06/27/18 7989 06/27/18 0505  BP: 97/75 114/82 128/70   Pulse: (!) 56 62 72   Resp: 18 20    Temp: 98.5 F (36.9 C) 98.7 F (37.1 C) 98.2 F (36.8 C)   TempSrc: Oral Oral Oral   SpO2: 92% 97% 97%   Weight:    100.1 kg   Height:        Intake/Output Summary (Last 24 hours) at 06/27/2018 1051 Last data filed at 06/27/2018 0600 Gross per 24 hour  Intake 1058.47 ml  Output 6 ml  Net 1052.47 ml   Filed Weights   06/25/18 0148 06/27/18 0505  Weight: 102.1 kg 100.1 kg   Exam  General: Well developed, well nourished, NAD, appears stated age  46: NCAT, mucous membranes moist.   Cardiovascular: S1 S2 auscultated, RRR, no murmur  Respiratory: Clear to auscultation bilaterally with equal chest rise  Abdomen: Soft, epigastric TTP, nondistended, + bowel sounds  Extremities: warm dry without cyanosis clubbing or edema  Neuro: AAOx3, nonfocal  Psych: N appropriate mood and affect  Data Reviewed: I have personally reviewed following labs and imaging studies  CBC: Recent Labs  Lab 06/25/18 0218 06/26/18 0352  WBC 13.4* 12.9*  NEUTROABS 8.0*  --   HGB 14.4 13.6  HCT 42.7 42.2  MCV 94.9 97.0  PLT 523* 287*   Basic Metabolic Panel: Recent Labs  Lab 06/25/18 0218 06/26/18 0352  NA 138 138  K 3.4* 3.7  CL 104 104  CO2 23 27  GLUCOSE 118* 69*  BUN 13 8  CREATININE 0.90 0.86  CALCIUM 9.3 8.8*   GFR: Estimated Creatinine Clearance: 135.1 mL/min (by C-G formula based on SCr of 0.86 mg/dL). Liver Function Tests: Recent Labs  Lab 06/25/18 0218 06/26/18 0352  AST 21 15  ALT 44 35  ALKPHOS 86 81  BILITOT 0.7 0.7  PROT 8.0 7.0  ALBUMIN 4.5 3.9   Recent Labs  Lab 06/25/18 0218 06/26/18 1252 06/27/18 0826  LIPASE 27 48 38   No results for input(s): AMMONIA in the last 168 hours. Coagulation Profile: Recent Labs  Lab 06/26/18 0352  INR 1.0   Cardiac Enzymes: No results for input(s): CKTOTAL, CKMB, CKMBINDEX, TROPONINI in the last 168 hours. BNP (last 3 results) No results for input(s): PROBNP in the last 8760 hours. HbA1C: No results for input(s): HGBA1C in the last 72 hours. CBG: Recent Labs  Lab 06/25/18 0804 06/26/18 0817 06/27/18 0751  GLUCAP 92 111* 78   Lipid  Profile: No results for input(s): CHOL, HDL, LDLCALC, TRIG, CHOLHDL, LDLDIRECT in the last 72 hours. Thyroid Function Tests: No results for input(s): TSH, T4TOTAL, FREET4, T3FREE, THYROIDAB in the last 72 hours. Anemia Panel: No results for input(s): VITAMINB12, FOLATE, FERRITIN, TIBC, IRON, RETICCTPCT in the last 72 hours. Urine analysis:    Component Value Date/Time   COLORURINE YELLOW 11/21/2017 0835   APPEARANCEUR CLEAR 11/21/2017 0835   LABSPEC 1.026 11/21/2017 0835   PHURINE 5.0 11/21/2017 0835   GLUCOSEU NEGATIVE 11/21/2017 0835   HGBUR NEGATIVE 11/21/2017 0835   BILIRUBINUR NEGATIVE 11/21/2017 0835   KETONESUR NEGATIVE 11/21/2017 0835   PROTEINUR NEGATIVE 11/21/2017 0835   UROBILINOGEN 0.2 09/19/2009 1400   NITRITE NEGATIVE 11/21/2017 0835   LEUKOCYTESUR NEGATIVE 11/21/2017 0835   Sepsis Labs: @LABRCNTIP (procalcitonin:4,lacticidven:4)  )No results found for this or any previous visit (from the past 240 hour(s)).    Radiology Studies: No results found.   Scheduled Meds: . acetaminophen  500 mg Oral TID  . enoxaparin (LOVENOX) injection  40 mg Subcutaneous  Q24H  . gabapentin  300 mg Oral BID  . pantoprazole  40 mg Oral BID   Continuous Infusions: . sodium chloride 10 mL/hr at 06/27/18 0600     LOS: 1 day   Time Spent in minutes   30 minutes   Gizell Danser D.O. on 06/27/2018 at 10:51 AM  Between 7am to 7pm - Please see pager noted on [email or web address]  After 7pm go to [email or web address]  And look for the night coverage person covering for me after hours  [facility] Office  [phone]

## 2018-06-28 LAB — COMPREHENSIVE METABOLIC PANEL
ALT: 27 U/L (ref 0–44)
AST: 17 U/L (ref 15–41)
Albumin: 4.1 g/dL (ref 3.5–5.0)
Alkaline Phosphatase: 86 U/L (ref 38–126)
Anion gap: 9 (ref 5–15)
BUN: 9 mg/dL (ref 6–20)
CO2: 27 mmol/L (ref 22–32)
Calcium: 9 mg/dL (ref 8.9–10.3)
Chloride: 100 mmol/L (ref 98–111)
Creatinine, Ser: 1 mg/dL (ref 0.61–1.24)
GFR calc Af Amer: >60 mL/min (ref >60)
GFR calc non Af Amer: >60 mL/min (ref >60)
Glucose, Bld: 80 mg/dL (ref 70–99)
Potassium: 3.9 mmol/L (ref 3.5–5.1)
Sodium: 136 mmol/L (ref 135–145)
Total Bilirubin: 0.8 mg/dL (ref 0.3–1.2)
Total Protein: 7.8 g/dL (ref 6.5–8.1)

## 2018-06-28 LAB — GLUCOSE, CAPILLARY: Glucose-Capillary: 85 mg/dL (ref 70–99)

## 2018-06-28 NOTE — Progress Notes (Signed)
PROGRESS NOTE    Andre Simmons  EXB:[id number] DOB: [birth year] DOA: 06/25/2018 PCP: Maryella Shivers, MD   Brief Narrative:  HPI On 06/25/2018 by Dr. Gerlean Ren Andre Simmons is a 43 y.o. 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  Unable to keep any food down.  Denied any fevers, chills.  Follows Dr. Barry Dienes outpatient.He has not received any postop radiation or chemotherapy.  For the most part he had been doing well after the surgery.  Interim history Patient admitted with abdominal pain likely acute pancreatitis.  General surgery was consulted and appreciated.  Assessment & Plan   Abdominal pain likely acute pancreatitis -Unable to tolerate much oral intake.  Did have nausea yesterday evening and received Zofran -Lipsase and LFTs continue to be normal -Continues to need IV Dilaudid for pain control -CT abdomen pelvis showed abnormal lesion in the pancreas.   -MRI shows 3.9 x 2.1 x 2.7 cm complex collection at the pancreatectomy, suggestive of pseudocyst or postoperative collection.  Suggest follow-up MRI abdomen without and with IV contrast in 3 months.  Mild pancreatic and peripancreatic edema indicative of mild acute pancreatitis.  No findings of metastatic disease in the abdomen. -General surgery consulted and appreciated -Continue IV Dilaudid for pain control as well as antiemetics. Unfortunately, pain not controlled with oral hydrocodone.  Patient continues to use IV Dilaudid as well as hydrocodone around-the-clock, with no improvement in his symptoms. (Not sure if patient's pain is proportional to physical exam) -Discussed with general surgery, Dr. Barry Dienes will not be available until next week, appointment set with her as an  outpatient if patient is to be discharged this weekend.  No surgical intervention or drainage needed at this time.  Continue to treat as acute pancreatitis. -General surgery added gabapentin and increased PPI to twice daily -Tolerated liquid diet, advanced to heart healthy diet  -Discussed with Dr. Johney Maine today- feels this may not be acute pancreatitis and does not feel that fluid collection should cause this amount of pain. Feels that we should allow patient to eat and monitor his pain.   History of pancreatic neuroendocrine tumor -Status post distal pancreatectomy and splenectomy -Treatment and plan as above, patient will need to follow-up with Dr. Barry Dienes  History of chronic pain -Patient states that he threw away all of his home narcotics given that his children are staying with him.  He feels it only Dilaudid and OxyContin have helped him.  Not feel that Percocet or hydrocodone help much. -Checked [place] controlled database.  Patient did in fact receive Dilaudid from Dr. Barry Dienes in the past, and is recently received oxycodone acetaminophen in February 2020 from Dr. Trenton Gammon. -Patient may benefit from outpatient pain clinic once pancreatitis has been approved and resolved.  GERD -Continue PPI  DVT Prophylaxis  Lovenox  Code Status: Full  Family Communication: None at bedside  Disposition Plan: Admitted.  Diposition pending improvement in pain and transition to oral medications only.  Consultants General surgery  Procedures  None  Antibiotics   Anti-infectives (From admission, onward)   None      Subjective:   Andre Simmons seen and examined today.  Continues to have abdominal pain.  States he continuously needs to have the IV pain medication along with the oral medications.  Denies current chest pain, shortness  of breath, vomiting, diarrhea constipation, dizziness or headache.   Objective:   Vitals:   06/27/18 1328 06/27/18 2021 06/28/18 0443 06/28/18 0700  BP: 101/84 125/86  (!) 121/92   Pulse: (!) 59 (!) 54 65   Resp: 16 18 18    Temp: 97.6 F (36.4 C) 98.8 F (37.1 C) 98 F (36.7 C)   TempSrc: Oral Oral Oral   SpO2: 90% 95% 94%   Weight:    100.6 kg  Height:        Intake/Output Summary (Last 24 hours) at 06/28/2018 1016 Last data filed at 06/28/2018 0254 Gross per 24 hour  Intake 1152.38 ml  Output 200 ml  Net 952.38 ml   Filed Weights   06/25/18 0148 06/27/18 0505 06/28/18 0700  Weight: 102.1 kg 100.1 kg 100.6 kg   Exam  General: Well developed, well nourished, NAD, appears stated age  73: NCAT, mucous membranes moist.   Cardiovascular: S1 S2 auscultated, RRR, no murmur  Respiratory: Clear to auscultation bilaterally with equal chest rise  Abdomen: Soft, NONTENDER, nondistended, + bowel sounds  Extremities: warm dry without cyanosis clubbing or edema  Neuro: AAOx3, nonfocal  Skin: Without rashes exudates or nodules  Psych: Normal affect and demeanor   Data Reviewed: I have personally reviewed following labs and imaging studies  CBC: Recent Labs  Lab 06/25/18 0218 06/26/18 0352  WBC 13.4* 12.9*  NEUTROABS 8.0*  --   HGB 14.4 13.6  HCT 42.7 42.2  MCV 94.9 97.0  PLT 523* 270*   Basic Metabolic Panel: Recent Labs  Lab 06/25/18 0218 06/26/18 0352 06/28/18 0348  NA 138 138 136  K 3.4* 3.7 3.9  CL 104 104 100  CO2 23 27 27   GLUCOSE 118* 69* 80  BUN 13 8 9   CREATININE 0.90 0.86 1.00  CALCIUM 9.3 8.8* 9.0   GFR: Estimated Creatinine Clearance: 116.2 mL/min (by C-G formula based on SCr of 1 mg/dL). Liver Function Tests: Recent Labs  Lab 06/25/18 0218 06/26/18 0352 06/28/18 0348  AST 21 15 17   ALT 44 35 27  ALKPHOS 86 81 86  BILITOT 0.7 0.7 0.8  PROT 8.0 7.0 7.8  ALBUMIN 4.5 3.9 4.1   Recent Labs  Lab 06/25/18 0218 06/26/18 1252 06/27/18 0826  LIPASE 27 48 38   No results for input(s): AMMONIA in the last 168 hours. Coagulation Profile: Recent Labs  Lab 06/26/18 0352  INR 1.0   Cardiac Enzymes:  No results for input(s): CKTOTAL, CKMB, CKMBINDEX, TROPONINI in the last 168 hours. BNP (last 3 results) No results for input(s): PROBNP in the last 8760 hours. HbA1C: No results for input(s): HGBA1C in the last 72 hours. CBG: Recent Labs  Lab 06/25/18 0804 06/26/18 0817 06/27/18 0751 06/28/18 0747  GLUCAP 92 111* 78 85   Lipid Profile: No results for input(s): CHOL, HDL, LDLCALC, TRIG, CHOLHDL, LDLDIRECT in the last 72 hours. Thyroid Function Tests: No results for input(s): TSH, T4TOTAL, FREET4, T3FREE, THYROIDAB in the last 72 hours. Anemia Panel: No results for input(s): VITAMINB12, FOLATE, FERRITIN, TIBC, IRON, RETICCTPCT in the last 72 hours. Urine analysis:    Component Value Date/Time   COLORURINE YELLOW 11/21/2017 [place] 11/21/2017 0835   LABSPEC 1.026 11/21/2017 0835   PHURINE 5.0 11/21/2017 0835   GLUCOSEU NEGATIVE 11/21/2017 0835   HGBUR NEGATIVE 11/21/2017 0835   BILIRUBINUR NEGATIVE 11/21/2017 0835   KETONESUR NEGATIVE 11/21/2017 0835   PROTEINUR NEGATIVE 11/21/2017 0835   UROBILINOGEN 0.2 09/19/2009 1400  NITRITE NEGATIVE 11/21/2017 0835   LEUKOCYTESUR NEGATIVE 11/21/2017 0835   Sepsis Labs: @LABRCNTIP (procalcitonin:4,lacticidven:4)  )No results found for this or any previous visit (from the past 240 hour(s)).    Radiology Studies: No results found.   Scheduled Meds: . acetaminophen  500 mg Oral TID  . enoxaparin (LOVENOX) injection  40 mg Subcutaneous Q24H  . gabapentin  300 mg Oral BID  . pantoprazole  40 mg Oral BID  . polyethylene glycol  17 g Oral BID   Continuous Infusions: . sodium chloride Stopped (06/27/18 1948)     LOS: 2 days   Time Spent in minutes   30 minutes   Ashyla Luth D.O. on 06/28/2018 at 10:16 AM  Between 7am to 7pm - Please see pager noted on [email or web address]  After 7pm go to [email or web address]  And look for the night coverage person covering for me after hours  [facility] Office   [phone]

## 2018-06-29 LAB — GLUCOSE, CAPILLARY: Glucose-Capillary: 87 mg/dL (ref 70–99)

## 2018-06-29 MED ORDER — OXYCODONE HCL ER 10 MG PO T12A
10.0000 mg | EXTENDED_RELEASE_TABLET | Freq: Two times a day (BID) | ORAL | Status: DC
  Administered 2018-06-29 – 2018-06-30 (×3): 10 mg via ORAL
  Filled 2018-06-29 (×3): qty 1

## 2018-06-29 MED ORDER — ACETAMINOPHEN 325 MG PO TABS
650.0000 mg | ORAL_TABLET | Freq: Four times a day (QID) | ORAL | Status: DC
  Administered 2018-06-29 – 2018-06-30 (×4): 650 mg via ORAL
  Filled 2018-06-29 (×4): qty 2

## 2018-06-29 MED ORDER — GABAPENTIN 400 MG PO CAPS
400.0000 mg | ORAL_CAPSULE | Freq: Two times a day (BID) | ORAL | Status: DC
  Administered 2018-06-29 – 2018-06-30 (×2): 400 mg via ORAL
  Filled 2018-06-29 (×2): qty 1

## 2018-06-29 MED ORDER — TRAZODONE HCL 50 MG PO TABS
50.0000 mg | ORAL_TABLET | Freq: Once | ORAL | Status: AC
  Administered 2018-06-29: 50 mg via ORAL
  Filled 2018-06-29: qty 1

## 2018-06-29 MED ORDER — HYDROMORPHONE HCL 1 MG/ML IJ SOLN
0.5000 mg | INTRAMUSCULAR | Status: DC | PRN
  Administered 2018-06-29 – 2018-06-30 (×8): 0.5 mg via INTRAVENOUS
  Filled 2018-06-29 (×8): qty 0.5

## 2018-06-29 NOTE — Progress Notes (Signed)
Transferred patient to room 1418. Report given to nurse prior to transfer

## 2018-06-29 NOTE — Consult Note (Signed)
[facility] CONSULT Reason for consult: Epigastric pain Referring Physician: [facility]  Andre Simmons is an 43 y.o. male.  HPI: He was seen as an unassigned patient about 9 months ago and was found to have a mass in the pancreas with EUS and biopsy by Dr. Ardis Hughs.  Underwent subsequent distal pancreatectomy and splenectomy by Dr. Barry Dienes with the finding of a neuroendocrine tumor.  He has been followed by Dr. Barry Dienes as an outpatient.  He is undergone several back surgeries.  He has been on chronic pain medications and had been doing reasonably well until last week when he began to have nausea and vomiting.  He was admitted to the hospital with normal LFTs and lipase.  WBC slightly up at 13.CT scan showed a mass at the resection site without clear inflammatory changes.  MRI was done showing an irregular rounded mass measuring approximately 3.9 cm in greatest diameter at the resection site.  There was fullness of the pancreatic head and minimal edema.  There was diffuse fatty liver.  Patient is feeling a little better with pain medications and apparently arrangements have been made for him to follow-up with Dr. Barry Dienes in a couple weeks as an outpatient. He notes that he has had a long history of GI problems and is seen several gastroenterologist including some in the general area as well as some in [place] state.  He has been diagnosed with IBS, chronic GERD etc.  He takes omeprazole chronically which keeps his GERD symptoms under good control.  Takes 40 mg twice daily.  He has not had a recent EGD.  The patient is quite certain that his current pain and discomfort is different from GERD symptoms.  He has been asymptomatic regarding his GERD for some time.  He adamantly denies the use of ibuprofen, Aleve or any other NSAIDs.  Past Medical History:  Diagnosis Date  . Arthritis   . Chronic back pain   . Depression   . GERD (gastroesophageal reflux disease)   . Knee pain   . Pancreatic  tumor     Past Surgical History:  Procedure Laterality Date  . BACK SURGERY  03/2018   Dr Trenton Gammon.  L4-L5, S1 micro discectomy  . EUS N/A 09/25/2017   Procedure: UPPER ENDOSCOPIC ULTRASOUND (EUS) LINEAR;  Surgeon: Milus Banister, MD;  Location: [facility];  Service: Endoscopy;  Laterality: N/A;  Radial and Linear  . FINE NEEDLE ASPIRATION  09/25/2017   Procedure: FINE NEEDLE ASPIRATION (FNA) RADIAL;  Surgeon: Milus Banister, MD;  Location: [facility];  Service: Endoscopy;;  . KNEE SURGERY Left    x 20 surgeries  . LAPAROSCOPIC DISTAL PANCREATECTOMY  11/26/2017   Diagnostic laparoscopy, laparoscopic hand-assisted distal   . MOUTH SURGERY     as a child, unsure if wisdom teeth or tonsils  . PANCREATECTOMY N/A 11/25/2017   Procedure: LAPAROSCOPIC DISTAL PANCREATECTOMY AND SPLENECTOMY ERAS PATHWAY;  Surgeon: Stark Klein, MD;  Location: [facility] OR;  Service: General;  Laterality: N/A;    Family History  Problem Relation Age of Onset  . Thyroid cancer Mother   . Other Father        MVA at age 71  . Esophageal cancer Maternal Grandmother        smoker  . Breast cancer Maternal Grandmother   . Esophageal cancer Paternal Grandmother   . Colon cancer Neg Hx   . Rectal cancer Neg Hx     Social History:  reports that he has quit  smoking. He has never used smokeless tobacco. He reports current drug use. Drug: Psilocybin. He reports that he does not drink alcohol.  Allergies: No Known Allergies  Medications; Prior to Admission medications   Medication Sig Start Date End Date Taking? Authorizing Provider  omeprazole (PRILOSEC) 40 MG capsule Take 40 mg by mouth 2 (two) times daily.    Yes [provider]  gabapentin (NEURONTIN) 300 MG capsule Take 1 capsule (300 mg total) by mouth 2 (two) times daily. Patient not taking: Reported on 06/25/2018 12/02/17   Stark Klein, MD  HYDROmorphone (DILAUDID) 2 MG tablet Take 1-2 tablets (2-4 mg total) by mouth every 3 (three) hours as needed  for moderate pain or severe pain. Patient not taking: Reported on 06/25/2018 12/02/17   Stark Klein, MD  methocarbamol (ROBAXIN) 500 MG tablet Take 1 tablet (500 mg total) by mouth 2 (two) times daily as needed for muscle spasms. Patient not taking: Reported on 06/25/2018 03/15/18   Ward, Ozella Almond, PA-C  methylPREDNISolone (MEDROL DOSEPAK) 4 MG TBPK tablet Take as directed on package. Patient not taking: Reported on 06/25/2018 03/15/18   Ward, Ozella Almond, PA-C  oxyCODONE-acetaminophen (PERCOCET/ROXICET) 5-325 MG tablet Take 2 tablets by mouth every 4 (four) hours as needed for severe pain. Patient not taking: Reported on 06/25/2018 03/15/18   Ward, Ozella Almond, PA-C   . acetaminophen  650 mg Oral Q6H  . enoxaparin (LOVENOX) injection  40 mg Subcutaneous Q24H  . gabapentin  400 mg Oral BID  . oxyCODONE  10 mg Oral Q12H  . pantoprazole  40 mg Oral BID  . polyethylene glycol  17 g Oral BID   PRN Meds sodium chloride, bisacodyl, hydrALAZINE, HYDROmorphone (DILAUDID) injection, naphazoline-glycerin, ondansetron **OR** ondansetron (ZOFRAN) IV, polyethylene glycol, senna-docusate, sodium phosphate Results for orders placed or performed during the hospital encounter of 06/25/18 (from the past 48 hour(s))  Comprehensive metabolic panel     Status: None   Collection Time: 06/28/18  3:48 AM  Result Value Ref Range   Sodium 136 135 - 145 mmol/L   Potassium 3.9 3.5 - 5.1 mmol/L   Chloride 100 98 - 111 mmol/L   CO2 27 22 - 32 mmol/L   Glucose, Bld 80 70 - 99 mg/dL   BUN 9 6 - 20 mg/dL   Creatinine, Ser 1.00 0.61 - 1.24 mg/dL   Calcium 9.0 8.9 - 10.3 mg/dL   Total Protein 7.8 6.5 - 8.1 g/dL   Albumin 4.1 3.5 - 5.0 g/dL   AST 17 15 - 41 U/L   ALT 27 0 - 44 U/L   Alkaline Phosphatase 86 38 - 126 U/L   Total Bilirubin 0.8 0.3 - 1.2 mg/dL   GFR calc non Af Amer >60 >60 mL/min   GFR calc Af Amer >60 >60 mL/min   Anion gap 9 5 - 15    Comment: Performed at [facility], [place] [address], [place], [place] [id number]  Glucose, capillary     Status: None   Collection Time: 06/28/18  7:47 AM  Result Value Ref Range   Glucose-Capillary 85 70 - 99 mg/dL  Glucose, capillary     Status: None   Collection Time: 06/29/18  8:01 AM  Result Value Ref Range   Glucose-Capillary 87 70 - 99 mg/dL    No results found.            Blood pressure 105/85, pulse 62, temperature 97.9 F (36.6 C), temperature source Oral, resp. rate 16, height  6\' 3"  (1.905 m), weight 99.9 kg, SpO2 94 %.  Physical exam:   General--Pleasant white male in no acute distress ENT--nonicteric Neck--supple Heart--regular rate and rhythm without murmurs or gallops Lungs--clear Abdomen--nondistended and soft.  Minimal epigastric tenderness to palpation. Psych--alert and oriented x3 normal mood answers questions appropriately   Assessment: 1.  Epigastric pain.  There is a mass at the resection of his pancreas.  The etiology of this is unclear.  Suspect that this is could be the cause of his pain.  He has been on chronic PPI for some time and there is no obvious other source. 2.  Status post resection of neuroendocrine tumor from the distal pancreas and splenectomy 10/19 3.  Chronic GERD.  Patient has had these symptoms for a number of years and does appear to be stable on his current regimen of PPIs. 4.  Chronic low back pain.  He has had several operations and is on chronic narcotics for this.  Plan: 1.  Do not feel that the patient really needs urgent EGD at this time.  Most likely explanation for his pain is the mass in his pancreas.  He is not interested in an EGD since he does not feel that the symptoms are consistent with his prior reflux.  At this point I would just continue him on omeprazole with plans to perform EGD in the future if it would be beneficial. We will sign off please call during this admission if he is having any problems.   Nancy Fetter 06/29/2018, 5:20 PM   This note  was created using voice recognition software and minor errors may Have occurred unintentionally. Pager: [phone] If no answer or after hours call [phone]

## 2018-06-29 NOTE — Progress Notes (Signed)
[facility]/Trauma Progress Note      Assessment/Plan Multiple back surgeries with some back pain GERD  Abdominal pain  T2N0M0pancreatic neuroendocrine tumor, - S/p diagnostic laparoscopy, laparoscopic hand-assisted distal pancreatectomy/splenectomy 11/25/2017 with Dr. Stark Klein - 3.9 x 2.1 x 2.7 pancreatic fluid collection, fullness of the pancreatic head with mild peripancreatic edema/possible acute pancreatitis  FEN: heart healthy  ID: None DVT: SCDs Follow-up: Dr. Stark Klein  Plan: We have asked Dr. Barry Dienes to review scans. No plan for additional surgery at this time but will await further recs from Dr. Barry Dienes. Instructed pt to continue full liquids as he stated liquids did not cause him as much pain as solid food.    LOS: 3 days    Subjective: CC: epigastric abdominal pain  Baseline his pain is around 5/10 but with food it goes up higher. He has been having some vomiting with solid food. Having flatus. He was having less pain and vomiting with liquids. We discussed full liquids and protein supplementation.   Objective: Vital signs in last 24 hours: Temp:  [97.8 F (36.6 C)-98.9 F (37.2 C)] 98.2 F (36.8 C) (05/04 0629) Pulse Rate:  [68-87] 68 (05/04 0629) Resp:  [16-18] 18 (05/04 0629) BP: (99-125)/(77-94) 99/77 (05/04 0629) SpO2:  [94 %-96 %] 94 % (05/04 0629) Weight:  [99.9 kg] 99.9 kg (05/03 2123) Last BM Date: 06/23/18  Intake/Output from previous day: 05/03 0701 - 05/04 0700 In: 720 [P.O.:720] Out: -  Intake/Output this shift: No intake/output data recorded.  PE: Gen:  Alert, NAD, pleasant, cooperative Pulm:  Rate and effort normal Abd: Soft, ND, +BS, mild TTP of epigastric region without guarding. No peritonitis  Skin: no rashes noted, warm and dry   Anti-infectives: Anti-infectives (From admission, onward)   None      Lab Results:  No results for input(s): WBC, HGB, HCT, PLT in the last 72 hours. BMET Recent Labs   06/28/18 0348  NA 136  K 3.9  CL 100  CO2 27  GLUCOSE 80  BUN 9  CREATININE 1.00  CALCIUM 9.0   PT/INR No results for input(s): LABPROT, INR in the last 72 hours. CMP     Component Value Date/Time   NA 136 06/28/2018 0348   K 3.9 06/28/2018 0348   CL 100 06/28/2018 0348   CO2 27 06/28/2018 0348   GLUCOSE 80 06/28/2018 0348   BUN 9 06/28/2018 0348   CREATININE 1.00 06/28/2018 0348   CALCIUM 9.0 06/28/2018 0348   PROT 7.8 06/28/2018 0348   ALBUMIN 4.1 06/28/2018 0348   AST 17 06/28/2018 0348   ALT 27 06/28/2018 0348   ALKPHOS 86 06/28/2018 0348   BILITOT 0.8 06/28/2018 0348   GFRNONAA >60 06/28/2018 0348   GFRAA >60 06/28/2018 0348   Lipase     Component Value Date/Time   LIPASE 38 06/27/2018 0826    Studies/Results: No results found.    Andre Simmons , [facility] 06/29/2018, 8:59 AM  Pager: [phone] Mon-Wed, Friday 7:00am-4:30pm Thurs 7am-11:30am  Consults: [phone]

## 2018-06-29 NOTE — Progress Notes (Signed)
PROGRESS NOTE    Andre Simmons  YQM:[id number] DOB: 06-10-1975 DOA: 06/25/2018 PCP: Maryella Shivers, MD   Brief Narrative:  HPI On 06/25/2018 by Dr. Gerlean Ren Andre Simmons is a 43 y.o. 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  Unable to keep any food down.  Denied any fevers, chills.  Follows Dr. Barry Dienes outpatient.He has not received any postop radiation or chemotherapy.  For the most part he had been doing well after the surgery.  Interim history Patient admitted with abdominal pain likely acute pancreatitis.  General surgery was consulted and appreciated.  Assessment & Plan   Abdominal pain likely acute pancreatitis -Unable to tolerate much oral intake.  Did have nausea yesterday evening and received Zofran -Lipsase and LFTs continue to be normal -Continues to need IV Dilaudid for pain control -CT abdomen pelvis showed abnormal lesion in the pancreas.   -MRI shows 3.9 x 2.1 x 2.7 cm complex collection at the pancreatectomy, suggestive of pseudocyst or postoperative collection.  Suggest follow-up MRI abdomen without and with IV contrast in 3 months.  Mild pancreatic and peripancreatic edema indicative of mild acute pancreatitis.  No findings of metastatic disease in the abdomen. -General surgery consulted and appreciated -Continue IV Dilaudid for pain control as well as antiemetics. Unfortunately, pain not controlled with oral hydrocodone.  Patient continues to use IV Dilaudid as well as hydrocodone around-the-clock, with no improvement in his symptoms. (Not sure if patient's pain is proportional to physical exam) -Continue gabapentin and increased PPI to twice daily -Tolerated liquid diet, advanced to heart healthy diet - however  felt nausea- will place back on full liquids -Discussed with general surgery, Dr. Barry Dienes to review scans and make further recommendations.  Continue full liquid diet given that patient has had nausea with solid food -Gastroenterology also consulted and appreciated  History of pancreatic neuroendocrine tumor -Status post distal pancreatectomy and splenectomy -Treatment and plan as above, patient will need to follow-up with Dr. Barry Dienes  History of chronic pain -Patient states that he threw away all of his home narcotics given that his children are staying with him.  He feels it only Dilaudid and OxyContin have helped him.  Not feel that Percocet or hydrocodone help much. -Checked [place] controlled database.  Patient did in fact receive Dilaudid from Dr. Barry Dienes in the past, and is recently received oxycodone acetaminophen in February 2020 from Dr. Trenton Gammon. -Patient may benefit from outpatient pain clinic once pancreatitis has been approved and resolved.  GERD -Continue PPI  DVT Prophylaxis  Lovenox  Code Status: Full  Family Communication: None at bedside  Disposition Plan: Admitted.  Diposition pending improvement in pain and transition to oral medications only.  Consultants General surgery Gastroenterology  Procedures  None  Antibiotics   Anti-infectives (From admission, onward)   None      Subjective:   Andre Simmons seen and examined today.  Continues to have abdominal pain and states that he had more nausea with solid foods yesterday.  Feels his pain is approximately 5/10 with food.  Patient currently denies any chest pain, shortness of breath, headache or dizziness. Objective:   Vitals:   06/28/18 1345 06/28/18 2054 06/28/18 2123 06/29/18 0629  BP: 125/87 (!) 120/94  99/77  Pulse: 70 87  68  Resp: 16 18  18  Temp: 98.9 F (37.2 C) 97.8 F (36.6 C)  98.2 F (36.8 C)  TempSrc:  Oral  Oral  SpO2: 96% 95%  94%  Weight:   99.9 kg   Height:        Intake/Output Summary  (Last 24 hours) at 06/29/2018 1025 Last data filed at 06/29/2018 0600 Gross per 24 hour  Intake 720 ml  Output -  Net 720 ml   Filed Weights   06/27/18 0505 06/28/18 0700 06/28/18 2123  Weight: 100.1 kg 100.6 kg 99.9 kg   Exam  General: Well developed, well nourished, NAD, appears stated age  56: NCAT, mucous membranes moist.   Cardiovascular: S1 S2 auscultated, RRR, no murmur  Respiratory: Clear to auscultation bilaterally with equal chest rise  Abdomen: Soft, patient complains of epigastric TTP (however no guarding) nondistended, + bowel sounds  Extremities: warm dry without cyanosis clubbing or edema  Neuro: AAOx3, nonfocal  Psych: Normal affect and demeanor   Data Reviewed: I have personally reviewed following labs and imaging studies  CBC: Recent Labs  Lab 06/25/18 0218 06/26/18 0352  WBC 13.4* 12.9*  NEUTROABS 8.0*  --   HGB 14.4 13.6  HCT 42.7 42.2  MCV 94.9 97.0  PLT 523* 423*   Basic Metabolic Panel: Recent Labs  Lab 06/25/18 0218 06/26/18 0352 06/28/18 0348  NA 138 138 136  K 3.4* 3.7 3.9  CL 104 104 100  CO2 23 27 27   GLUCOSE 118* 69* 80  BUN 13 8 9   CREATININE 0.90 0.86 1.00  CALCIUM 9.3 8.8* 9.0   GFR: Estimated Creatinine Clearance: 116.2 mL/min (by C-G formula based on SCr of 1 mg/dL). Liver Function Tests: Recent Labs  Lab 06/25/18 0218 06/26/18 0352 06/28/18 0348  AST 21 15 17   ALT 44 35 27  ALKPHOS 86 81 86  BILITOT 0.7 0.7 0.8  PROT 8.0 7.0 7.8  ALBUMIN 4.5 3.9 4.1   Recent Labs  Lab 06/25/18 0218 06/26/18 1252 06/27/18 0826  LIPASE 27 48 38   No results for input(s): AMMONIA in the last 168 hours. Coagulation Profile: Recent Labs  Lab 06/26/18 0352  INR 1.0   Cardiac Enzymes: No results for input(s): CKTOTAL, CKMB, CKMBINDEX, TROPONINI in the last 168 hours. BNP (last 3 results) No results for input(s): PROBNP in the last 8760 hours. HbA1C: No results for input(s): HGBA1C in the last 72 hours. CBG: Recent  Labs  Lab 06/25/18 0804 06/26/18 0817 06/27/18 0751 06/28/18 0747 06/29/18 0801  GLUCAP 92 111* 78 85 87   Lipid Profile: No results for input(s): CHOL, HDL, LDLCALC, TRIG, CHOLHDL, LDLDIRECT in the last 72 hours. Thyroid Function Tests: No results for input(s): TSH, T4TOTAL, FREET4, T3FREE, THYROIDAB in the last 72 hours. Anemia Panel: No results for input(s): VITAMINB12, FOLATE, FERRITIN, TIBC, IRON, RETICCTPCT in the last 72 hours. Urine analysis:    Component Value Date/Time   COLORURINE YELLOW 11/21/2017 0835   APPEARANCEUR CLEAR 11/21/2017 0835   LABSPEC 1.026 11/21/2017 0835   PHURINE 5.0 11/21/2017 0835   GLUCOSEU NEGATIVE 11/21/2017 0835   HGBUR NEGATIVE 11/21/2017 0835   BILIRUBINUR NEGATIVE 11/21/2017 0835   KETONESUR NEGATIVE 11/21/2017 0835   PROTEINUR NEGATIVE 11/21/2017 0835   UROBILINOGEN 0.2 09/19/2009 1400   NITRITE NEGATIVE 11/21/2017 0835   LEUKOCYTESUR NEGATIVE 11/21/2017 0835   Sepsis Labs: @LABRCNTIP (procalcitonin:4,lacticidven:4)  )No results found for this or any previous visit (from the past 240 hour(s)).    Radiology Studies: No results found.   Scheduled Meds: .  acetaminophen  500 mg Oral TID  . enoxaparin (LOVENOX) injection  40 mg Subcutaneous Q24H  . gabapentin  300 mg Oral BID  . pantoprazole  40 mg Oral BID  . polyethylene glycol  17 g Oral BID   Continuous Infusions: . sodium chloride Stopped (06/27/18 1948)     LOS: 3 days   Time Spent in minutes   30 minutes   Vaun Hyndman D.O. on 06/29/2018 at 10:25 AM  Between 7am to 7pm - Please see pager noted on [email or web address]  After 7pm go to [email or web address]  And look for the night coverage person covering for me after hours  [facility] Office  [phone]

## 2018-06-30 MED ORDER — GABAPENTIN 400 MG PO CAPS: 400.0000 mg | ORAL_CAPSULE | Freq: Two times a day (BID) | ORAL | 0 refills | Status: DC

## 2018-06-30 NOTE — Progress Notes (Signed)
Pt asking for pain med for DC.  Instructed that hospital providers are not going to send home with scripts.  Charge nurse notified.

## 2018-06-30 NOTE — Care Management Important Message (Signed)
Important Message  Patient Details IM Letter given to [facility] Case Manager to present to the Patient Name: Andre Simmons MRN: [id number] Date of Birth: [birth year]   Medicare Important Message Given:  Yes    Kerin Salen 06/30/2018, 11:56 AM

## 2018-06-30 NOTE — Discharge Instructions (Signed)

## 2018-06-30 NOTE — Care Management Obs Status (Deleted)
[place] NOTIFICATION   Patient Details  Name: Andre Simmons MRN: [id number] Date of Birth: April 29, 1975   Medicare Observation Status Notification Given:  Yes    MahabirJuliann Pulse, RN 06/30/2018, 2:57 PM

## 2018-06-30 NOTE — Progress Notes (Signed)
    CC:   Abdominal pain  Subjective: Pt says he had his worst night ever last PM, I have his pain meds listed below.  He remains convinced it has to do with surgery.    Objective: Vital signs in last 24 hours: Temp:  [97.7 F (36.5 C)-98.1 F (36.7 C)] 97.7 F (36.5 C) (05/05 0509) Pulse Rate:  [52-63] 52 (05/05 0509) Resp:  [14-16] 16 (05/05 0509) BP: (100-124)/(68-85) 100/85 (05/05 0509) SpO2:  [93 %-100 %] 100 % (05/05 0509) Last BM Date: 06/23/18 120 PO recorded Voided x 2 recorded Afebrile, VSS No labs Pain:  Tylenol 650 x 3 since yesterday, Neurontin x 2 yesterday, Dilaudid 0.5 x 6 since yesterday, Dilaudid 1 mg x 5 yesterday, oxycontin 10 mg x 2 yesterday, trazodone x 1 last PM Intake/Output from previous day: 05/04 0701 - 05/05 0700 In: 120 [P.O.:120] Out: 0  Intake/Output this shift: No intake/output data recorded.  General appearance: alert, cooperative and no distress GI: soft, non-tender; bowel sounds normal; no masses,  no organomegaly Midline incision looks fine.     Lab Results:  No results for input(s): WBC, HGB, HCT, PLT in the last 72 hours.  BMET Recent Labs    06/28/18 0348  NA 136  K 3.9  CL 100  CO2 27  GLUCOSE 80  BUN 9  CREATININE 1.00  CALCIUM 9.0   PT/INR No results for input(s): LABPROT, INR in the last 72 hours.  Recent Labs  Lab 06/25/18 0218 06/26/18 0352 06/28/18 0348  AST 21 15 17   ALT 44 35 27  ALKPHOS 86 81 86  BILITOT 0.7 0.7 0.8  PROT 8.0 7.0 7.8  ALBUMIN 4.5 3.9 4.1     Lipase     Component Value Date/Time   LIPASE 38 06/27/2018 0826     Medications: . acetaminophen  650 mg Oral Q6H  . enoxaparin (LOVENOX) injection  40 mg Subcutaneous Q24H  . gabapentin  400 mg Oral BID  . oxyCODONE  10 mg Oral Q12H  . pantoprazole  40 mg Oral BID  . polyethylene glycol  17 g Oral BID    Assessment/Plan Back surgeries with some back pain L4-L5, S1 micro discectomy 09/25/17 GERD  Abdominal pain   [id number] neuroendocrine tumor, - S/p diagnostic laparoscopy, laparoscopic hand-assisted distal pancreatectomy/splenectomy 11/25/2017 with Dr. Stark Klein - 3.9 x 2.1 x 2.7 pancreatic fluid collection, fullness of the pancreatic head with mild peripancreatic edema/possible acute pancreatitis  FEN: heart healthy  ID: None DVT: SCDs Follow-up: Dr. Stark Klein  Plan:  It is my opinion he needs to have pain control issue addressed.  We don't have a good clinical reason for him to have all of these pain issues.  He can follow up with Dr. Barry Dienes in 3 weeks and she can reassess.        LOS: 4 days    Shandell Giovanni 06/30/2018 [phone]

## 2018-06-30 NOTE — Discharge Summary (Signed)
Physician Discharge Summary  Andre Simmons RSW:[id number] DOB: [birth year] DOA: 06/25/2018  PCP: Maryella Shivers, MD  Admit date: 06/25/2018 Discharge date: 06/30/2018  Time spent: 45 minutes  Recommendations for Outpatient Follow-up:  Patient will be discharged to home.  Patient will need to follow up with primary care provider within one week of discharge.  Follow up Dr. Barry Dienes in 3 weeks. Follow with gastroneterology.  Patient should continue medications as prescribed.  Patient should follow a full liquid diet.   Discharge Diagnoses:  Abdominal pain likely acute pancreatitis History of pancreatic neuroendocrine tumor History of chronic pain GERD  Discharge Condition: Stable  Diet recommendation: Full liquid  Filed Weights   06/27/18 0505 06/28/18 0700 06/28/18 2123  Weight: 100.1 kg 100.6 kg 99.9 kg    History of present illness:  On 06/25/2018 by Dr. Zachery Dauer [name] a 43 y.o.malewith medical history significant ofneuroendocrine tumor of pancreas status post distal pancreatectomy and splenectomyOct 2019, chronic back pain status post surgical repair came to the hospital with complains of worsening of epigastric abdominal pain with persistent nausea and vomiting. Unable to keep any food down. Denied any fevers, chills. Follows Dr. Barry Dienes outpatient.He has not received any postop radiation or chemotherapy.For the most part he had been doing well after the surgery.  Hospital Course:  Abdominal pain likely acute pancreatitis -Unable to tolerate much oral intake.  Did have nausea yesterday evening and received Zofran -Lipsase and LFTs continue to be normal -Continues to need IV Dilaudid for pain control -CT abdomen pelvis showed abnormal lesion in the pancreas.   -MRI shows 3.9 x 2.1 x 2.7 cm complex collection at the pancreatectomy, suggestive of pseudocyst or postoperative collection.  Suggest follow-up MRI abdomen without and with IV contrast in 3  months.  Mild pancreatic and peripancreatic edema indicative of mild acute pancreatitis.  No findings of metastatic disease in the abdomen. -General surgery consulted and appreciated -Continue IV Dilaudid for pain control as well as antiemetics. Unfortunately, pain not controlled with oral hydrocodone.  Patient continues to use IV Dilaudid as well as hydrocodone around-the-clock, with no improvement in his symptoms. (Not sure if patient's pain is proportional to physical exam) -Continue gabapentin and increased PPI to twice daily -Gastroenterology also consulted and appreciated-recommended EGD.  -Discussed this with patient, he states he has a gastroenterologist in [facility] that he would like to follow-up with. -Discussed with general surgery, patient to follow-up with Dr. Barry Dienes in 3 weeks.  He supposedly has pain medications at home.  Patient to continue full liquid diet.  At this time fluid noted on scans can be expected postoperatively, no intervention required.  History of pancreatic neuroendocrine tumor -Status post distal pancreatectomy and splenectomy -Treatment and plan as above, patient will need to follow-up with Dr. Barry Dienes  History of chronic pain -Patient states that he threw away all of his home narcotics given that his children are staying with him.  He feels it only Dilaudid and OxyContin have helped him.  Not feel that Percocet or hydrocodone help much. -Checked [place] controlled database.  Patient did in fact receive Dilaudid from Dr. Barry Dienes in the past, and is recently received oxycodone acetaminophen in February 2020 from Dr. Trenton Gammon. -Patient may benefit from outpatient pain clinic once pancreatitis has been approved and resolved. -Discussion with general surgery PA, patient told her that he had several pain pills left.  However per discussion with patient several days ago as noted above, patient stated that he had thrown away  these pills given that his children were  now living with him.  Today he now admits that those pills are at his mother's house.  GERD -Continue PPI  Procedures: None  Consultations: Gastroenterology General surgery  Discharge Exam: Vitals:   06/29/18 2151 06/30/18 0509  BP: 105/68 100/85  Pulse: 61 (!) 52  Resp: 14 16  Temp: 98.1 F (36.7 C) 97.7 F (36.5 C)  SpO2: 93% 100%     General: Well developed, well nourished, NAD, appears stated age  61: NCAT, mucous membranes moist.  Neck: Supple  Cardiovascular: S1 S2 auscultated, RRR, no murmur  Respiratory: Clear to auscultation bilaterally with equal chest rise  Abdomen: Soft, nontender, nondistended, + bowel sounds  Extremities: warm dry without cyanosis clubbing or edema  Neuro: AAOx3, nonfocal  Psych: Normal affect and demeanor  Discharge Instructions Discharge Instructions    Discharge instructions   Complete by:  As directed    Patient will be discharged to home.  Patient will need to follow up with primary care provider within one week of discharge.  Follow up Dr. Barry Dienes in 3 weeks. Follow with gastroneterology.  Patient should continue medications as prescribed.  Patient should follow a full liquid diet.     Allergies as of 06/30/2018   No Known Allergies     Medication List    STOP taking these medications   methocarbamol 500 MG tablet Commonly known as:  ROBAXIN   methylPREDNISolone 4 MG Tbpk tablet Commonly known as:  MEDROL DOSEPAK     TAKE these medications   gabapentin 400 MG capsule Commonly known as:  NEURONTIN Take 1 capsule (400 mg total) by mouth 2 (two) times daily. What changed:    medication strength  how much to take   HYDROmorphone 2 MG tablet Commonly known as:  DILAUDID Take 1-2 tablets (2-4 mg total) by mouth every 3 (three) hours as needed for moderate pain or severe pain.   omeprazole 40 MG capsule Commonly known as:  PRILOSEC Take 40 mg by mouth 2 (two) times daily.   oxyCODONE-acetaminophen 5-325  MG tablet Commonly known as:  PERCOCET/ROXICET Take 2 tablets by mouth every 4 (four) hours as needed for severe pain.      No Known Allergies Follow-up Information    Stark Klein, MD Follow up.   Specialty:  General Surgery Why:  Call for follow up in 3 weeks.   Contact information: [address] [place] [address] [phone]        Maryella Shivers, MD. Schedule an appointment as soon as possible for a visit in 1 week(s).   Specialty:  Family Medicine Why:  Hospital follow up, possible referral to pain management clinic Contact information: [place] [address] [phone]            The results of significant diagnostics from this hospitalization (including imaging, microbiology, ancillary and laboratory) are listed below for reference.    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 Patient presents with abdominal pain and nausea for 1 day. Low-attenuation focus at the resection site on CT performed earlier today. EXAM: MRI ABDOMEN WITHOUT AND WITH CONTRAST TECHNIQUE: Multiplanar multisequence MR imaging of the abdomen was performed both before and after the administration of intravenous contrast. CONTRAST:  10 cc Gadavist IV. COMPARISON:  06/25/2018 CT abdomen/pelvis.  09/11/2017 MRI abdomen FINDINGS: Lower chest: No acute abnormality at the lung bases. Hepatobiliary: Normal liver size and configuration. Prominent diffuse hepatic steatosis. No liver mass. Normal gallbladder with no cholelithiasis. No biliary ductal dilatation. Common bile duct diameter 2 mm. No evidence of choledocholithiasis. Pancreas: Status post distal pancreatectomy. At the pancreatectomy margin, there is an irregular rounded 3.9 x 2.1 x 2.7 cm collection with mixed T1 and T2  signal intensity and no convincing enhancement or significant wall thickening (series 1105/image 61). No additional pancreatic lesions. No pancreatic duct dilation. There is mild fullness of the pancreatic head with associated mild pancreatic and peripancreatic edema (series 3/image 46), which may indicate a mild acute pancreatitis. No pancreas divisum. Spleen: Surgically absent. Adrenals/Urinary Tract: Normal adrenals. No hydronephrosis. Simple 0.9 cm upper right renal cyst. No suspicious renal masses. Stomach/Bowel: Normal non-distended stomach. Visualized small and large bowel is normal caliber, with no bowel wall thickening. Vascular/Lymphatic: Normal caliber abdominal aorta. Patent portal, hepatic and renal veins. Top-normal peripancreatic 0.9 cm node (series 1104/image 61) is not appreciably changed compared to the preoperative MRI of 09/11/2017. No pathologically enlarged lymph nodes in the abdomen. Other: No ascites. Musculoskeletal: No aggressive appearing focal osseous lesions. IMPRESSION: 1. Nonenhancing irregular rounded 3.9 x 2.1 x 2.7 cm complex collection at the pancreatectomy margin, most suggestive of a pseudocyst or postoperative collection. Findings are not favored to represent local tumor recurrence. Suggest follow-up MRI abdomen without and with IV contrast in 3 months. 2. Mild pancreatic and peripancreatic edema in the pancreatic head, which may indicate a mild acute pancreatitis. 3. No findings of metastatic disease in the abdomen. No biliary or pancreatic duct dilation. 4. Prominent diffuse hepatic steatosis. Electronically Signed   By: Ilona Sorrel M.D.   On: 06/25/2018 08:22   Ct Abdomen Pelvis W Contrast  Result Date: 06/25/2018 CLINICAL DATA:  Abdominal pain and nausea for 1 day. Surgery for resection of 4 cm solid pseudo papillary neoplasm of the distal pancreas October 2019. Patient states symptoms are similar to prior to surgery. EXAM: CT ABDOMEN AND PELVIS WITH CONTRAST TECHNIQUE:  Multidetector CT imaging of the abdomen and pelvis was performed using the standard protocol following bolus administration of intravenous contrast. CONTRAST:  167mL OMNIPAQUE IOHEXOL 300 MG/ML  SOLN COMPARISON:  CT of the abdomen and pelvis 11/03/2017 FINDINGS: Lower chest: Mild dependent atelectasis is present at the lung bases. No focal nodule, mass, or airspace disease is present. Heart size is normal. No significant pleural or pericardial effusion is present. Hepatobiliary: There is diffuse fatty infiltration liver. No discrete lesions are present. Common bile duct and gallbladder are normal. Pancreas: Distal pancreas is resected. There is a low-density lesion at the resection site measuring 2.3 x 2.6 x 3.8 cm. Pancreatic head and proximal body are normal. No inflammatory changes are associated. Spleen: Surgically absent Adrenals/Urinary Tract: Adrenal glands are normal bilaterally. Kidneys and ureters are within normal limits. The urinary bladder is within normal limits. Stomach/Bowel: The stomach and duodenum are within normal limits. Small bowel is unremarkable. Terminal ileum is within normal limits. The appendix is visualized and normal. The ascending and transverse colon are normal. The descending and sigmoid colon are normal. Vascular/Lymphatic: Atherosclerotic calcifications are again noted in the distal aorta. There is no aneurysm. Reproductive: Prostate is unremarkable. Other: Vertebral body heights alignment are maintained. Mild degenerative changes are noted at L5-S1. No focal lytic or blastic lesions are present. Bony pelvis is within normal limits. Hips are located and normal. Musculoskeletal: Vertebral body heights alignment  are maintained. No focal lytic or blastic lesions are present. Degenerative changes are noted at L5-S1. Bony pelvis is within normal limits. Hips are located and normal. IMPRESSION: 1. Low-density cystic lesion at the site of the pancreatic resection measures 2.3 x 2.6 x 3.8  cm. No definite solid components are present. This is nonspecific and may be postoperative in nature. Recurrent tumor is not excluded. Elective MRI of the pancreas without and with contrast could be used for further evaluation. 2. No acute abnormality. 3. Hepatic steatosis. 4.  Aortic Atherosclerosis (ICD10-I70.0). Electronically Signed   By: San Morelle M.D.   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 Patient presents with abdominal pain and nausea for 1 day. Low-attenuation focus at the resection site on CT performed earlier today. EXAM: MRI ABDOMEN WITHOUT AND WITH CONTRAST TECHNIQUE: Multiplanar multisequence MR imaging of the abdomen was performed both before and after the administration of intravenous contrast. CONTRAST:  10 cc Gadavist IV. COMPARISON:  06/25/2018 CT abdomen/pelvis.  09/11/2017 MRI abdomen FINDINGS: Lower chest: No acute abnormality at the lung bases. Hepatobiliary: Normal liver size and configuration. Prominent diffuse hepatic steatosis. No liver mass. Normal gallbladder with no cholelithiasis. No biliary ductal dilatation. Common bile duct diameter 2 mm. No evidence of choledocholithiasis. Pancreas: Status post distal pancreatectomy. At the pancreatectomy margin, there is an irregular rounded 3.9 x 2.1 x 2.7 cm collection with mixed T1 and T2 signal intensity and no convincing enhancement or significant wall thickening (series 1105/image 61). No additional pancreatic lesions. No pancreatic duct dilation. There is mild fullness of the pancreatic head with associated mild pancreatic and peripancreatic edema (series 3/image 46), which may indicate a mild acute pancreatitis. No pancreas divisum. Spleen: Surgically absent. Adrenals/Urinary Tract: Normal adrenals. No hydronephrosis. Simple 0.9 cm upper right renal cyst.  No suspicious renal masses. Stomach/Bowel: Normal non-distended stomach. Visualized small and large bowel is normal caliber, with no bowel wall thickening. Vascular/Lymphatic: Normal caliber abdominal aorta. Patent portal, hepatic and renal veins. Top-normal peripancreatic 0.9 cm node (series 1104/image 61) is not appreciably changed compared to the preoperative MRI of 09/11/2017. No pathologically enlarged lymph nodes in the abdomen. Other: No ascites. Musculoskeletal: No aggressive appearing focal osseous lesions. IMPRESSION: 1. Nonenhancing irregular rounded 3.9 x 2.1 x 2.7 cm complex collection at the pancreatectomy margin, most suggestive of a pseudocyst or postoperative collection. Findings are not favored to represent local tumor recurrence. Suggest follow-up MRI abdomen without and with IV contrast in 3 months. 2. Mild pancreatic and peripancreatic edema in the pancreatic head, which may indicate a mild acute pancreatitis. 3. No findings of metastatic disease in the abdomen. No biliary or pancreatic duct dilation. 4. Prominent diffuse hepatic steatosis. Electronically Signed   By: Ilona Sorrel M.D.   On: 06/25/2018 08:22    Microbiology: No results found for this or any previous visit (from the past 240 hour(s)).   Labs: Basic Metabolic Panel: Recent Labs  Lab 06/25/18 0218 06/26/18 0352 06/28/18 0348  NA 138 138 136  K 3.4* 3.7 3.9  CL 104 104 100  CO2 23 27 27   GLUCOSE 118* 69* 80  BUN 13 8 9   CREATININE 0.90 0.86 1.00  CALCIUM 9.3 8.8* 9.0   Liver Function Tests: Recent Labs  Lab 06/25/18 0218 06/26/18 0352 06/28/18 0348  AST 21 15 17   ALT 44 35 27  ALKPHOS  86 81 86  BILITOT 0.7 0.7 0.8  PROT 8.0 7.0 7.8  ALBUMIN 4.5 3.9 4.1   Recent Labs  Lab 06/25/18 0218 06/26/18 1252 06/27/18 0826  LIPASE 27 48 38   No results for input(s): AMMONIA in the last 168 hours. CBC: Recent Labs  Lab 06/25/18 0218 06/26/18 0352  WBC 13.4* 12.9*  NEUTROABS 8.0*  --   HGB 14.4  13.6  HCT 42.7 42.2  MCV 94.9 97.0  PLT 523* 497*   Cardiac Enzymes: No results for input(s): CKTOTAL, CKMB, CKMBINDEX, TROPONINI in the last 168 hours. BNP: BNP (last 3 results) No results for input(s): BNP in the last 8760 hours.  ProBNP (last 3 results) No results for input(s): PROBNP in the last 8760 hours.  CBG: Recent Labs  Lab 06/25/18 0804 06/26/18 0817 06/27/18 0751 06/28/18 0747 06/29/18 0801  GLUCAP 92 111* 78 85 87       Signed:  [facility] 06/30/2018, 9:28 AM

## 2018-08-06 DIAGNOSIS — M5416 Radiculopathy, lumbar region: Secondary | ICD-10-CM | POA: Diagnosis not present

## 2018-08-11 DIAGNOSIS — M4807 Spinal stenosis, lumbosacral region: Secondary | ICD-10-CM | POA: Diagnosis not present

## 2018-08-11 DIAGNOSIS — M5127 Other intervertebral disc displacement, lumbosacral region: Secondary | ICD-10-CM | POA: Diagnosis not present

## 2018-08-11 DIAGNOSIS — M5416 Radiculopathy, lumbar region: Secondary | ICD-10-CM | POA: Diagnosis not present

## 2018-08-13 DIAGNOSIS — M5126 Other intervertebral disc displacement, lumbar region: Secondary | ICD-10-CM | POA: Diagnosis not present

## 2018-08-13 DIAGNOSIS — M5127 Other intervertebral disc displacement, lumbosacral region: Secondary | ICD-10-CM | POA: Diagnosis not present

## 2018-08-27 DIAGNOSIS — M5416 Radiculopathy, lumbar region: Secondary | ICD-10-CM | POA: Diagnosis not present

## 2018-08-27 DIAGNOSIS — M5126 Other intervertebral disc displacement, lumbar region: Secondary | ICD-10-CM | POA: Diagnosis not present

## 2018-08-27 DIAGNOSIS — M5127 Other intervertebral disc displacement, lumbosacral region: Secondary | ICD-10-CM | POA: Diagnosis not present

## 2018-10-07 DIAGNOSIS — Z6829 Body mass index (BMI) 29.0-29.9, adult: Secondary | ICD-10-CM | POA: Diagnosis not present

## 2018-10-07 DIAGNOSIS — M5416 Radiculopathy, lumbar region: Secondary | ICD-10-CM | POA: Diagnosis not present

## 2018-11-12 DIAGNOSIS — I10 Essential (primary) hypertension: Secondary | ICD-10-CM | POA: Diagnosis not present

## 2018-11-12 DIAGNOSIS — Z6828 Body mass index (BMI) 28.0-28.9, adult: Secondary | ICD-10-CM | POA: Diagnosis not present

## 2018-11-12 DIAGNOSIS — M5416 Radiculopathy, lumbar region: Secondary | ICD-10-CM | POA: Diagnosis not present

## 2018-11-13 ENCOUNTER — Other Ambulatory Visit: Payer: Self-pay | Admitting: Neurosurgery

## 2018-12-03 NOTE — Pre-Procedure Instructions (Signed)
BAYLEN ALLUMS  12/03/2018      [id number] [address], [place] [address] Phone: [phone] Fax: [phone]    Your procedure is scheduled on 12/08/18.  Report to [facility] Admitting at 6 A.M.  Call this number if you have problems the morning of surgery:  [phone]   Remember:  Do not eat or drink after midnight.   Take these medicines the morning of surgery with A SIP OF WATER ----NEURONTIN,PRILOSEC,OXYCODONE    Do not wear jewelry, make-up or nail polish.  Do not wear lotions, powders, or perfumes, or deodorant.  Do not shave 48 hours prior to surgery.  Men may shave face and neck.  Do not bring valuables to the hospital.  [facility] is not responsible for any belongings or valuables.  Contacts, dentures or bridgework may not be worn into surgery.  Leave your suitcase in the car.  After surgery it may be brought to your room.  For patients admitted to the hospital, discharge time will be determined by your treatment team.  Patients discharged the day of surgery will not be allowed to drive home.    Special instructions:  Do not take any aspirin,anti-inflammatories,vitamins,or herbal supplements 5-7 days prior to surgery.  Please read over the following fact sheets that you were given. MRSA Information     - Preparing for Surgery  Before surgery, you can play an important role.  Because skin is not sterile, your skin needs to be as free of germs as possible.  You can reduce the number of germs on you skin by washing with CHG (chlorahexidine gluconate) soap before surgery.  CHG is an antiseptic cleaner which kills germs and bonds with the skin to continue killing germs even after washing.  Oral Hygiene is also important in reducing the risk of infection.  Remember to brush your teeth with your regular toothpaste the morning of surgery.  Please DO NOT use if you have an  allergy to CHG or antibacterial soaps.  If your skin becomes reddened/irritated stop using the CHG and inform your nurse when you arrive at Short Stay.  Do not shave (including legs and underarms) for at least 48 hours prior to the first CHG shower.  You may shave your face.  Please follow these instructions carefully:   1.  Shower with CHG Soap the night before surgery and the morning of Surgery.  2.  If you choose to wash your hair, wash your hair first as usual with your normal shampoo.  3.  After you shampoo, rinse your hair and body thoroughly to remove the shampoo. 4.  Use CHG as you would any other liquid soap.  You can apply chg directly to the skin and wash gently with a      scrungie or washcloth.           5.  Apply the CHG Soap to your body ONLY FROM THE NECK DOWN.   Do not use on open wounds or open sores. Avoid contact with your eyes, ears, mouth and genitals (private parts).  Wash genitals (private parts) with your normal soap.  6.  Wash thoroughly, paying special attention to the area where your surgery will be performed.  7.  Thoroughly rinse your body with warm water from the neck down.  8.  DO NOT shower/wash with your normal soap after using and rinsing off the CHG Soap.  9.  Pat yourself dry with a clean towel.            10.  Wear clean pajamas.            11.  Place clean sheets on your bed the night of your first shower and do not sleep with pets.  Day of Surgery  Do not apply any lotions/deoderants the morning of surgery.   Please wear clean clothes to the hospital/surgery center. Remember to brush your teeth with toothpaste.

## 2018-12-04 ENCOUNTER — Other Ambulatory Visit (HOSPITAL_COMMUNITY)
Admission: RE | Admit: 2018-12-04 | Discharge: 2018-12-04 | Disposition: A | Discharge status: Home / Self Care | Payer: Medicare Other | Source: Ambulatory Visit | Attending: Neurosurgery | Admitting: Neurosurgery

## 2018-12-04 ENCOUNTER — Encounter (HOSPITAL_COMMUNITY)
Admission: RE | Admit: 2018-12-04 | Discharge: 2018-12-04 | Disposition: A | Discharge status: Home / Self Care | Payer: Medicare Other | Source: Ambulatory Visit | Attending: Neurosurgery | Admitting: Neurosurgery

## 2018-12-04 ENCOUNTER — Encounter (HOSPITAL_COMMUNITY): Payer: Self-pay

## 2018-12-04 ENCOUNTER — Other Ambulatory Visit: Payer: Self-pay

## 2018-12-04 DIAGNOSIS — M5126 Other intervertebral disc displacement, lumbar region: Secondary | ICD-10-CM | POA: Diagnosis not present

## 2018-12-04 DIAGNOSIS — Z20828 Contact with and (suspected) exposure to other viral communicable diseases: Secondary | ICD-10-CM | POA: Diagnosis not present

## 2018-12-04 DIAGNOSIS — Z01812 Encounter for preprocedural laboratory examination: Secondary | ICD-10-CM | POA: Insufficient documentation

## 2018-12-04 LAB — CBC WITH DIFFERENTIAL/PLATELET
Abs Immature Granulocytes: 0.03 10*3/uL (ref 0.00–0.07)
Basophils Absolute: 0.1 10*3/uL (ref 0.0–0.1)
Basophils Relative: 1 %
Eosinophils Absolute: 0.3 10*3/uL (ref 0.0–0.5)
Eosinophils Relative: 4 %
HCT: 44.7 % (ref 39.0–52.0)
Hemoglobin: 15.1 g/dL (ref 13.0–17.0)
Immature Granulocytes: 0 %
Lymphocytes Relative: 30 %
Lymphs Abs: 2.7 10*3/uL (ref 0.7–4.0)
MCH: 32.2 pg (ref 26.0–34.0)
MCHC: 33.8 g/dL (ref 30.0–36.0)
MCV: 95.3 fL (ref 80.0–100.0)
Monocytes Absolute: 0.9 10*3/uL (ref 0.1–1.0)
Monocytes Relative: 10 %
Neutro Abs: 4.8 10*3/uL (ref 1.7–7.7)
Neutrophils Relative %: 55 %
Platelets: 526 10*3/uL — ABNORMAL HIGH (ref 150–400)
RBC: 4.69 MIL/uL (ref 4.22–5.81)
RDW: 14.1 % (ref 11.5–15.5)
WBC: 8.9 10*3/uL (ref 4.0–10.5)
nRBC: 0 % (ref 0.0–0.2)

## 2018-12-04 LAB — SURGICAL PCR SCREEN
MRSA, PCR: NEGATIVE
Staphylococcus aureus: POSITIVE — AB

## 2018-12-04 LAB — TYPE AND SCREEN
ABO/RH(D): A POS
Antibody Screen: NEGATIVE

## 2018-12-04 NOTE — Progress Notes (Signed)
PCP - Maryella Shivers  Chest x-ray - n/a EKG - n/a  COVID TEST- Today, Oct. 9th  Anesthesia review: n/a  Patient denies shortness of breath, fever, cough and chest pain at PAT appointment   Patient verbalized understanding of instructions that were given to them at the PAT appointment. Patient was also instructed that they will need to review over the PAT instructions again at home before surgery.

## 2018-12-07 ENCOUNTER — Encounter (HOSPITAL_COMMUNITY): Payer: Self-pay | Admitting: Anesthesiology

## 2018-12-07 LAB — NOVEL CORONAVIRUS, NAA (HOSP ORDER, SEND-OUT TO REF LAB; TAT 18-24 HRS): SARS-CoV-2, NAA: NOT DETECTED

## 2018-12-07 NOTE — Anesthesia Preprocedure Evaluation (Addendum)
Anesthesia Evaluation  Patient identified by MRN, date of birth, ID band Patient awake    Reviewed: Allergy & Precautions, NPO status , Patient's Chart, lab work & pertinent test results  Airway Mallampati: II  TM Distance: >3 FB Neck ROM: Full    Dental  (+) Teeth Intact, Dental Advisory Given   Pulmonary former smoker,    breath sounds clear to auscultation       Cardiovascular negative cardio ROS   Rhythm:Regular Rate:Normal     Neuro/Psych Depression    GI/Hepatic Neg liver ROS, GERD  Medicated,  Endo/Other  negative endocrine ROS  Renal/GU negative Renal ROS     Musculoskeletal  (+) Arthritis ,   Abdominal Normal abdominal exam  (+)   Peds  Hematology negative hematology ROS (+)   Anesthesia Other Findings   Reproductive/Obstetrics                            Anesthesia Physical Anesthesia Plan  ASA: II  Anesthesia Plan: General   Post-op Pain Management:    Induction: Intravenous  PONV Risk Score and Plan: 3 and Ondansetron, Dexamethasone and Midazolam  Airway Management Planned: Oral ETT  Additional Equipment: None  Intra-op Plan:   Post-operative Plan: Extubation in OR  Informed Consent: I have reviewed the patients History and Physical, chart, labs and discussed the procedure including the risks, benefits and alternatives for the proposed anesthesia with the patient or authorized representative who has indicated his/her understanding and acceptance.     Dental advisory given  Plan Discussed with: CRNA  Anesthesia Plan Comments: (COVID-19 Labs  No results for input(s): DDIMER, FERRITIN, LDH, CRP in the last 72 hours.  Lab Results      Component                Value               Date                      [id number]              NOT DETECTED        12/04/2018            )       Anesthesia Quick Evaluation

## 2018-12-08 ENCOUNTER — Inpatient Hospital Stay (HOSPITAL_COMMUNITY): Payer: Medicare Other

## 2018-12-08 ENCOUNTER — Encounter (HOSPITAL_COMMUNITY)
Admission: RE | Disposition: A | Discharge status: Home / Self Care | Payer: Self-pay | Source: Home / Self Care | Attending: Neurosurgery

## 2018-12-08 ENCOUNTER — Other Ambulatory Visit: Payer: Self-pay

## 2018-12-08 ENCOUNTER — Encounter (HOSPITAL_COMMUNITY): Payer: Self-pay | Admitting: General Practice

## 2018-12-08 ENCOUNTER — Inpatient Hospital Stay (HOSPITAL_COMMUNITY)
Admission: RE | Admit: 2018-12-08 | Discharge: 2018-12-09 | DRG: 455 | Disposition: A | Discharge status: Home / Self Care | Payer: Medicare Other | Attending: Neurosurgery | Admitting: Neurosurgery

## 2018-12-08 DIAGNOSIS — Z87891 Personal history of nicotine dependence: Secondary | ICD-10-CM | POA: Diagnosis not present

## 2018-12-08 DIAGNOSIS — M5126 Other intervertebral disc displacement, lumbar region: Secondary | ICD-10-CM | POA: Diagnosis present

## 2018-12-08 DIAGNOSIS — K219 Gastro-esophageal reflux disease without esophagitis: Secondary | ICD-10-CM | POA: Diagnosis present

## 2018-12-08 DIAGNOSIS — S83512A Sprain of anterior cruciate ligament of left knee, initial encounter: Secondary | ICD-10-CM | POA: Diagnosis not present

## 2018-12-08 DIAGNOSIS — M4326 Fusion of spine, lumbar region: Secondary | ICD-10-CM | POA: Diagnosis not present

## 2018-12-08 DIAGNOSIS — M1712 Unilateral primary osteoarthritis, left knee: Secondary | ICD-10-CM | POA: Diagnosis not present

## 2018-12-08 DIAGNOSIS — Z9889 Other specified postprocedural states: Secondary | ICD-10-CM | POA: Diagnosis not present

## 2018-12-08 DIAGNOSIS — M5116 Intervertebral disc disorders with radiculopathy, lumbar region: Secondary | ICD-10-CM | POA: Diagnosis present

## 2018-12-08 DIAGNOSIS — Z79899 Other long term (current) drug therapy: Secondary | ICD-10-CM | POA: Diagnosis not present

## 2018-12-08 DIAGNOSIS — M545 Low back pain: Secondary | ICD-10-CM | POA: Diagnosis not present

## 2018-12-08 DIAGNOSIS — Z419 Encounter for procedure for purposes other than remedying health state, unspecified: Secondary | ICD-10-CM

## 2018-12-08 DIAGNOSIS — K859 Acute pancreatitis without necrosis or infection, unspecified: Secondary | ICD-10-CM | POA: Diagnosis not present

## 2018-12-08 SURGERY — POSTERIOR LUMBAR FUSION 1 LEVEL
Anesthesia: General | Site: Back

## 2018-12-08 MED ORDER — LACTATED RINGERS IV SOLN: INTRAVENOUS | Status: DC

## 2018-12-08 MED ORDER — ONDANSETRON HCL 4 MG/2ML IJ SOLN
INTRAMUSCULAR | Status: AC
  Filled 2018-12-08: qty 2

## 2018-12-08 MED ORDER — ACETAMINOPHEN 650 MG RE SUPP: 650.0000 mg | RECTAL | Status: DC | PRN

## 2018-12-08 MED ORDER — HYDROMORPHONE HCL 1 MG/ML IJ SOLN
INTRAMUSCULAR | Status: AC
  Filled 2018-12-08: qty 1

## 2018-12-08 MED ORDER — HYDROMORPHONE HCL 1 MG/ML IJ SOLN
0.2500 mg | INTRAMUSCULAR | Status: DC | PRN
  Administered 2018-12-08: 11:00:00 0.5 mg via INTRAVENOUS
  Administered 2018-12-08: 11:00:00 0.25 mg via INTRAVENOUS
  Administered 2018-12-08: 11:00:00 0.5 mg via INTRAVENOUS
  Administered 2018-12-08: 11:00:00 0.25 mg via INTRAVENOUS
  Administered 2018-12-08: 12:00:00 0.5 mg via INTRAVENOUS

## 2018-12-08 MED ORDER — SODIUM CHLORIDE 0.9 % IV SOLN: 250.0000 mL | INTRAVENOUS | Status: DC

## 2018-12-08 MED ORDER — CHLORHEXIDINE GLUCONATE CLOTH 2 % EX PADS: 6.0000 | MEDICATED_PAD | Freq: Once | CUTANEOUS | Status: DC

## 2018-12-08 MED ORDER — FENTANYL CITRATE (PF) 250 MCG/5ML IJ SOLN
INTRAMUSCULAR | Status: DC | PRN
  Administered 2018-12-08: 50 ug via INTRAVENOUS
  Administered 2018-12-08: 150 ug via INTRAVENOUS
  Administered 2018-12-08: 100 ug via INTRAVENOUS
  Administered 2018-12-08 (×2): 50 ug via INTRAVENOUS

## 2018-12-08 MED ORDER — PROPOFOL 10 MG/ML IV BOLUS
INTRAVENOUS | Status: AC
  Filled 2018-12-08: qty 20

## 2018-12-08 MED ORDER — MEPERIDINE HCL 25 MG/ML IJ SOLN: 6.2500 mg | INTRAMUSCULAR | Status: DC | PRN

## 2018-12-08 MED ORDER — CEFAZOLIN SODIUM-DEXTROSE 2-4 GM/100ML-% IV SOLN
2.0000 g | INTRAVENOUS | Status: AC
  Administered 2018-12-08: 2 g via INTRAVENOUS

## 2018-12-08 MED ORDER — OXYCODONE HCL 5 MG PO TABS
10.0000 mg | ORAL_TABLET | ORAL | Status: DC | PRN
  Administered 2018-12-08 – 2018-12-09 (×8): 10 mg via ORAL
  Filled 2018-12-08 (×8): qty 2

## 2018-12-08 MED ORDER — SODIUM CHLORIDE 0.9 % IV SOLN
INTRAVENOUS | Status: DC | PRN
  Administered 2018-12-08: 09:00:00

## 2018-12-08 MED ORDER — HYDROMORPHONE HCL 1 MG/ML IJ SOLN
1.0000 mg | INTRAMUSCULAR | Status: DC | PRN
  Administered 2018-12-09: 1 mg via INTRAVENOUS
  Filled 2018-12-08: qty 1

## 2018-12-08 MED ORDER — ONDANSETRON HCL 4 MG/2ML IJ SOLN: 4.0000 mg | Freq: Four times a day (QID) | INTRAMUSCULAR | Status: DC | PRN

## 2018-12-08 MED ORDER — MUPIROCIN 2 % EX OINT
1.0000 "application " | TOPICAL_OINTMENT | Freq: Two times a day (BID) | CUTANEOUS | Status: DC
  Administered 2018-12-08: 1 via NASAL
  Filled 2018-12-08: qty 22

## 2018-12-08 MED ORDER — THROMBIN 20000 UNITS EX SOLR
CUTANEOUS | Status: AC
  Filled 2018-12-08: qty 20000

## 2018-12-08 MED ORDER — PROMETHAZINE HCL 25 MG/ML IJ SOLN
INTRAMUSCULAR | Status: AC
  Filled 2018-12-08: qty 1

## 2018-12-08 MED ORDER — LACTATED RINGERS IV SOLN
INTRAVENOUS | Status: DC
  Administered 2018-12-08 (×2): via INTRAVENOUS

## 2018-12-08 MED ORDER — POLYETHYLENE GLYCOL 3350 17 G PO PACK: 17.0000 g | PACK | Freq: Every day | ORAL | Status: DC | PRN

## 2018-12-08 MED ORDER — VANCOMYCIN HCL 1000 MG IV SOLR
INTRAVENOUS | Status: AC
  Filled 2018-12-08: qty 1000

## 2018-12-08 MED ORDER — ONDANSETRON HCL 4 MG PO TABS: 4.0000 mg | ORAL_TABLET | Freq: Four times a day (QID) | ORAL | Status: DC | PRN

## 2018-12-08 MED ORDER — PHENOL 1.4 % MT LIQD: 1.0000 | OROMUCOSAL | Status: DC | PRN

## 2018-12-08 MED ORDER — FENTANYL CITRATE (PF) 250 MCG/5ML IJ SOLN
INTRAMUSCULAR | Status: AC
  Filled 2018-12-08: qty 5

## 2018-12-08 MED ORDER — ACETAMINOPHEN 10 MG/ML IV SOLN
1000.0000 mg | Freq: Once | INTRAVENOUS | Status: DC | PRN
  Administered 2018-12-08: 11:00:00 1000 mg via INTRAVENOUS

## 2018-12-08 MED ORDER — HYDROCODONE-ACETAMINOPHEN 10-325 MG PO TABS: 1.0000 | ORAL_TABLET | ORAL | Status: DC | PRN

## 2018-12-08 MED ORDER — BISACODYL 10 MG RE SUPP: 10.0000 mg | Freq: Every day | RECTAL | Status: DC | PRN

## 2018-12-08 MED ORDER — ACETAMINOPHEN 325 MG PO TABS: 325.0000 mg | ORAL_TABLET | Freq: Once | ORAL | Status: DC | PRN

## 2018-12-08 MED ORDER — SODIUM CHLORIDE 0.9% FLUSH: 3.0000 mL | INTRAVENOUS | Status: DC | PRN

## 2018-12-08 MED ORDER — PROPOFOL 10 MG/ML IV BOLUS
INTRAVENOUS | Status: DC | PRN
  Administered 2018-12-08: 150 mg via INTRAVENOUS

## 2018-12-08 MED ORDER — DIAZEPAM 5 MG PO TABS
ORAL_TABLET | ORAL | Status: AC
  Filled 2018-12-08: qty 1

## 2018-12-08 MED ORDER — ACETAMINOPHEN 160 MG/5ML PO SOLN: 325.0000 mg | Freq: Once | ORAL | Status: DC | PRN

## 2018-12-08 MED ORDER — DEXAMETHASONE SODIUM PHOSPHATE 10 MG/ML IJ SOLN
INTRAMUSCULAR | Status: DC | PRN
  Administered 2018-12-08: 10 mg via INTRAVENOUS

## 2018-12-08 MED ORDER — THROMBIN 20000 UNITS EX SOLR
CUTANEOUS | Status: DC | PRN
  Administered 2018-12-08: 09:00:00 20 mL via TOPICAL

## 2018-12-08 MED ORDER — EPHEDRINE 5 MG/ML INJ
INTRAVENOUS | Status: AC
  Filled 2018-12-08: qty 10

## 2018-12-08 MED ORDER — PANTOPRAZOLE SODIUM 40 MG PO TBEC
40.0000 mg | DELAYED_RELEASE_TABLET | Freq: Every day | ORAL | Status: DC
  Administered 2018-12-08: 13:00:00 40 mg via ORAL
  Filled 2018-12-08 (×2): qty 1

## 2018-12-08 MED ORDER — SUGAMMADEX SODIUM 200 MG/2ML IV SOLN
INTRAVENOUS | Status: DC | PRN
  Administered 2018-12-08: 200 mg via INTRAVENOUS

## 2018-12-08 MED ORDER — PROMETHAZINE HCL 25 MG/ML IJ SOLN
6.2500 mg | INTRAMUSCULAR | Status: DC | PRN
  Administered 2018-12-08: 6.25 mg via INTRAVENOUS

## 2018-12-08 MED ORDER — PHENYLEPHRINE 40 MCG/ML (10ML) SYRINGE FOR IV PUSH (FOR BLOOD PRESSURE SUPPORT)
PREFILLED_SYRINGE | INTRAVENOUS | Status: AC
  Filled 2018-12-08: qty 10

## 2018-12-08 MED ORDER — CEFAZOLIN SODIUM-DEXTROSE 2-4 GM/100ML-% IV SOLN
INTRAVENOUS | Status: AC
  Filled 2018-12-08: qty 100

## 2018-12-08 MED ORDER — DEXAMETHASONE SODIUM PHOSPHATE 10 MG/ML IJ SOLN
INTRAMUSCULAR | Status: AC
  Filled 2018-12-08: qty 2

## 2018-12-08 MED ORDER — MIDAZOLAM HCL 5 MG/5ML IJ SOLN
INTRAMUSCULAR | Status: DC | PRN
  Administered 2018-12-08: 2 mg via INTRAVENOUS

## 2018-12-08 MED ORDER — ROCURONIUM BROMIDE 10 MG/ML (PF) SYRINGE
PREFILLED_SYRINGE | INTRAVENOUS | Status: AC
  Filled 2018-12-08: qty 10

## 2018-12-08 MED ORDER — DIAZEPAM 5 MG PO TABS
5.0000 mg | ORAL_TABLET | Freq: Four times a day (QID) | ORAL | Status: DC | PRN
  Administered 2018-12-08: 23:00:00 10 mg via ORAL
  Administered 2018-12-08: 11:00:00 5 mg via ORAL
  Administered 2018-12-08 – 2018-12-09 (×3): 10 mg via ORAL
  Filled 2018-12-08 (×4): qty 2

## 2018-12-08 MED ORDER — SUCCINYLCHOLINE CHLORIDE 200 MG/10ML IV SOSY
PREFILLED_SYRINGE | INTRAVENOUS | Status: AC
  Filled 2018-12-08: qty 10

## 2018-12-08 MED ORDER — DEXAMETHASONE SODIUM PHOSPHATE 10 MG/ML IJ SOLN
INTRAMUSCULAR | Status: AC
  Filled 2018-12-08: qty 1

## 2018-12-08 MED ORDER — CEFAZOLIN SODIUM-DEXTROSE 1-4 GM/50ML-% IV SOLN
1.0000 g | Freq: Three times a day (TID) | INTRAVENOUS | Status: AC
  Administered 2018-12-08 (×2): 1 g via INTRAVENOUS
  Filled 2018-12-08 (×2): qty 50

## 2018-12-08 MED ORDER — MENTHOL 3 MG MT LOZG: 1.0000 | LOZENGE | OROMUCOSAL | Status: DC | PRN

## 2018-12-08 MED ORDER — ROCURONIUM BROMIDE 50 MG/5ML IV SOSY
PREFILLED_SYRINGE | INTRAVENOUS | Status: DC | PRN
  Administered 2018-12-08: 10 mg via INTRAVENOUS
  Administered 2018-12-08: 50 mg via INTRAVENOUS
  Administered 2018-12-08: 30 mg via INTRAVENOUS

## 2018-12-08 MED ORDER — MIDAZOLAM HCL 2 MG/2ML IJ SOLN
INTRAMUSCULAR | Status: AC
  Filled 2018-12-08: qty 2

## 2018-12-08 MED ORDER — LIDOCAINE 2% (20 MG/ML) 5 ML SYRINGE
INTRAMUSCULAR | Status: AC
  Filled 2018-12-08: qty 5

## 2018-12-08 MED ORDER — BUPIVACAINE HCL (PF) 0.25 % IJ SOLN
INTRAMUSCULAR | Status: AC
  Filled 2018-12-08: qty 30

## 2018-12-08 MED ORDER — ONDANSETRON HCL 4 MG/2ML IJ SOLN
INTRAMUSCULAR | Status: DC | PRN
  Administered 2018-12-08: 4 mg via INTRAVENOUS

## 2018-12-08 MED ORDER — LIDOCAINE 2% (20 MG/ML) 5 ML SYRINGE
INTRAMUSCULAR | Status: DC | PRN
  Administered 2018-12-08: 50 mg via INTRAVENOUS

## 2018-12-08 MED ORDER — ACETAMINOPHEN 325 MG PO TABS
650.0000 mg | ORAL_TABLET | ORAL | Status: DC | PRN
  Administered 2018-12-08: 17:00:00 650 mg via ORAL
  Filled 2018-12-08: qty 2

## 2018-12-08 MED ORDER — DEXAMETHASONE SODIUM PHOSPHATE 10 MG/ML IJ SOLN: 10.0000 mg | Freq: Once | INTRAMUSCULAR | Status: DC

## 2018-12-08 MED ORDER — FLEET ENEMA 7-19 GM/118ML RE ENEM: 1.0000 | ENEMA | Freq: Once | RECTAL | Status: DC | PRN

## 2018-12-08 MED ORDER — BUPIVACAINE HCL (PF) 0.25 % IJ SOLN
INTRAMUSCULAR | Status: DC | PRN
  Administered 2018-12-08: 20 mL

## 2018-12-08 MED ORDER — VANCOMYCIN HCL 1000 MG IV SOLR
INTRAVENOUS | Status: DC | PRN
  Administered 2018-12-08: 1000 mg via TOPICAL

## 2018-12-08 MED ORDER — ACETAMINOPHEN 10 MG/ML IV SOLN
INTRAVENOUS | Status: AC
  Filled 2018-12-08: qty 100

## 2018-12-08 MED ORDER — SODIUM CHLORIDE 0.9% FLUSH: 3.0000 mL | Freq: Two times a day (BID) | INTRAVENOUS | Status: DC

## 2018-12-08 MED ORDER — 0.9 % SODIUM CHLORIDE (POUR BTL) OPTIME
TOPICAL | Status: DC | PRN
  Administered 2018-12-08: 09:00:00 1000 mL

## 2018-12-08 SURGICAL SUPPLY — 66 items
ADH SKN CLS APL DERMABOND .7 (GAUZE/BANDAGES/DRESSINGS) ×1
APL SKNCLS STERI-STRIP NONHPOA (GAUZE/BANDAGES/DRESSINGS) ×1
BAG DECANTER FOR FLEXI CONT (MISCELLANEOUS) ×3 IMPLANT
BENZOIN TINCTURE PRP APPL 2/3 (GAUZE/BANDAGES/DRESSINGS) ×3 IMPLANT
BLADE CLIPPER SURG (BLADE) IMPLANT
BUR CUTTER 7.0 ROUND (BURR) IMPLANT
BUR MATCHSTICK NEURO 3.0 LAGG (BURR) ×3 IMPLANT
CANISTER SUCT 3000ML PPV (MISCELLANEOUS) ×3 IMPLANT
CAP LCK SPNE (Orthopedic Implant) ×4 IMPLANT
CAP LOCK SPINE RADIUS (Orthopedic Implant) ×4 IMPLANT
CAP LOCKING (Orthopedic Implant) ×12 IMPLANT
CARTRIDGE OIL MAESTRO DRILL (MISCELLANEOUS) ×1 IMPLANT
CLOSURE WOUND 1/2 X4 (GAUZE/BANDAGES/DRESSINGS) ×1
CONT SPEC 4OZ CLIKSEAL STRL BL (MISCELLANEOUS) ×3 IMPLANT
COVER BACK TABLE 60X90IN (DRAPES) ×3 IMPLANT
COVER WAND RF STERILE (DRAPES) ×3 IMPLANT
DECANTER SPIKE VIAL GLASS SM (MISCELLANEOUS) ×3 IMPLANT
DERMABOND ADVANCED (GAUZE/BANDAGES/DRESSINGS) ×2
DERMABOND ADVANCED .7 DNX12 (GAUZE/BANDAGES/DRESSINGS) ×1 IMPLANT
DEVICE INTERBODY ELEVATE 23X9 (Cage) ×4 IMPLANT
DIFFUSER DRILL AIR PNEUMATIC (MISCELLANEOUS) ×3 IMPLANT
DRAPE C-ARM 42X72 X-RAY (DRAPES) ×6 IMPLANT
DRAPE HALF SHEET 40X57 (DRAPES) IMPLANT
DRAPE LAPAROTOMY 100X72X124 (DRAPES) ×3 IMPLANT
DRAPE SURG 17X23 STRL (DRAPES) ×12 IMPLANT
DRSG OPSITE POSTOP 4X6 (GAUZE/BANDAGES/DRESSINGS) ×3 IMPLANT
DURAPREP 26ML APPLICATOR (WOUND CARE) ×3 IMPLANT
ELECT REM PT RETURN 9FT ADLT (ELECTROSURGICAL) ×3
ELECTRODE REM PT RTRN 9FT ADLT (ELECTROSURGICAL) ×1 IMPLANT
EVACUATOR 1/8 PVC DRAIN (DRAIN) IMPLANT
GAUZE 4X4 16PLY RFD (DISPOSABLE) IMPLANT
GAUZE SPONGE 4X4 12PLY STRL (GAUZE/BANDAGES/DRESSINGS) IMPLANT
GLOVE BIO SURGEON STRL SZ 6.5 (GLOVE) IMPLANT
GLOVE BIO SURGEONS STRL SZ 6.5 (GLOVE)
GLOVE BIOGEL PI IND STRL 6.5 (GLOVE) ×1 IMPLANT
GLOVE BIOGEL PI INDICATOR 6.5 (GLOVE)
GLOVE ECLIPSE 9.0 STRL (GLOVE) ×2 IMPLANT
GLOVE EXAM NITRILE XL STR (GLOVE) IMPLANT
GLOVE SS PI 9.0 STRL (GLOVE) ×6 IMPLANT
GOWN STRL REUS W/ TWL LRG LVL3 (GOWN DISPOSABLE) IMPLANT
GOWN STRL REUS W/ TWL XL LVL3 (GOWN DISPOSABLE) ×2 IMPLANT
GOWN STRL REUS W/TWL 2XL LVL3 (GOWN DISPOSABLE) IMPLANT
GOWN STRL REUS W/TWL LRG LVL3 (GOWN DISPOSABLE)
GOWN STRL REUS W/TWL XL LVL3 (GOWN DISPOSABLE) ×6
KIT BASIN OR (CUSTOM PROCEDURE TRAY) ×3 IMPLANT
KIT TURNOVER KIT B (KITS) ×3 IMPLANT
MILL MEDIUM DISP (BLADE) ×3 IMPLANT
NEEDLE HYPO 22GX1.5 SAFETY (NEEDLE) ×3 IMPLANT
NS IRRIG 1000ML POUR BTL (IV SOLUTION) ×3 IMPLANT
OIL CARTRIDGE MAESTRO DRILL (MISCELLANEOUS) ×3
PACK LAMINECTOMY NEURO (CUSTOM PROCEDURE TRAY) ×3 IMPLANT
ROD RADIUS 40MM (Neuro Prosthesis/Implant) ×6 IMPLANT
ROD SPNL 40X5.5XNS TI RDS (Neuro Prosthesis/Implant) IMPLANT
SCREW 5.75X45MM (Screw) ×12 IMPLANT
SPONGE SURGIFOAM ABS GEL 100 (HEMOSTASIS) ×3 IMPLANT
STRIP CLOSURE SKIN 1/2X4 (GAUZE/BANDAGES/DRESSINGS) ×3 IMPLANT
SUT VIC AB 0 CT1 18XCR BRD8 (SUTURE) ×2 IMPLANT
SUT VIC AB 0 CT1 8-18 (SUTURE) ×3
SUT VIC AB 2-0 CT1 18 (SUTURE) ×3 IMPLANT
SUT VIC AB 3-0 SH 8-18 (SUTURE) ×6 IMPLANT
TOWEL GREEN STERILE (TOWEL DISPOSABLE) ×3 IMPLANT
TOWEL GREEN STERILE FF (TOWEL DISPOSABLE) ×3 IMPLANT
TRAY FOL W/BAG SLVR 16FR STRL (SET/KITS/TRAYS/PACK) IMPLANT
TRAY FOLEY MTR SLVR 16FR STAT (SET/KITS/TRAYS/PACK) ×1 IMPLANT
TRAY FOLEY W/BAG SLVR 16FR LF (SET/KITS/TRAYS/PACK) ×3
WATER STERILE IRR 1000ML POUR (IV SOLUTION) ×3 IMPLANT

## 2018-12-08 NOTE — Brief Op Note (Signed)
12/08/2018  10:38 AM  PATIENT:  Andre Simmons  43 y.o. male  PRE-OPERATIVE DIAGNOSIS:  Recurrent Herniated nucleus pulposus  POST-OPERATIVE DIAGNOSIS:  Recurrent Herniated nucleus pulposus  PROCEDURE:  Procedure(s) with comments: Lumbar Four- Five Posterior Lumbar Interbody Fusion (N/A) - Lumbar Four- Five Posterior Lumbar Interbody Fusion  SURGEON:  Surgeon(s) and Role:    * Earnie Larsson, MD - Primary  PHYSICIAN ASSISTANT:   ASSISTANTS:    ANESTHESIA:   general  EBL:  200 mL   BLOOD ADMINISTERED:none  DRAINS: none   LOCAL MEDICATIONS USED:  MARCAINE     SPECIMEN:  No Specimen  DISPOSITION OF SPECIMEN:  N/A  COUNTS:  YES  TOURNIQUET:  * No tourniquets in log *  DICTATION: .Dragon Dictation  PLAN OF CARE: Admit to inpatient   PATIENT DISPOSITION:  PACU - guarded condition.   Delay start of Pharmacological VTE agent (>24hrs) due to surgical blood loss or risk of bleeding: yes

## 2018-12-08 NOTE — Progress Notes (Signed)
Awakened from sleep for transport to unit/moved slowly/tens and felt nauseated upon moving, no emesis / puffiness around op site is unchanged from [place]

## 2018-12-08 NOTE — H&P (Signed)
Andre Simmons is an 43 y.o. male.   Chief Complaint: Back pain HPI: 43 year old male status post multiple L4-5 laminotomies and microdiscectomies presents with recurrent back and right lower extremity pain.  Work-up demonstrates evidence of another recurrent disc herniation at L4-5 with progressive disc space loss of height and endplate changes.  Patient has chronic disc generation L5-S1 without evidence of compression.  Remainder his lumbar spine is reasonably healthy.  Patient is failed conservative management.  He presents now for lumbar decompression and fusion in hopes of improving his symptoms.  Past Medical History:  Diagnosis Date  . Arthritis   . Chronic back pain   . Depression   . GERD (gastroesophageal reflux disease)   . Knee pain   . Pancreatic tumor     Past Surgical History:  Procedure Laterality Date  . BACK SURGERY  03/2018   Dr Trenton Gammon.  L4-L5, S1 micro discectomy  . EUS N/A 09/25/2017   Procedure: UPPER ENDOSCOPIC ULTRASOUND (EUS) LINEAR;  Surgeon: Milus Banister, MD;  Location: [facility];  Service: Endoscopy;  Laterality: N/A;  Radial and Linear  . FINE NEEDLE ASPIRATION  09/25/2017   Procedure: FINE NEEDLE ASPIRATION (FNA) RADIAL;  Surgeon: Milus Banister, MD;  Location: [facility];  Service: Endoscopy;;  . KNEE SURGERY Left    x 20 surgeries  . LAPAROSCOPIC DISTAL PANCREATECTOMY  11/26/2017   Diagnostic laparoscopy, laparoscopic hand-assisted distal   . MOUTH SURGERY     as a child, unsure if wisdom teeth or tonsils  . PANCREATECTOMY N/A 11/25/2017   Procedure: LAPAROSCOPIC DISTAL PANCREATECTOMY AND SPLENECTOMY ERAS PATHWAY;  Surgeon: Stark Klein, MD;  Location: [facility];  Service: General;  Laterality: N/A;  . Removal of Spleen  2019    Family History  Problem Relation Age of Onset  . Thyroid cancer Mother   . Other Father        MVA at age 30  . Esophageal cancer Maternal Grandmother        smoker  . Breast cancer Maternal Grandmother   .  Esophageal cancer Paternal Grandmother   . Colon cancer Neg Hx   . Rectal cancer Neg Hx    Social History:  reports that he has quit smoking. He has never used smokeless tobacco. He reports current drug use. Drug: Psilocybin. He reports that he does not drink alcohol.  Allergies:  Allergies  Allergen Reactions  . Adhesive [Tape] Rash and Other (See Comments)    Skin irritation.    Medications Prior to Admission  Medication Sig Dispense Refill  . omeprazole (PRILOSEC) 40 MG capsule Take 40 mg by mouth 2 (two) times daily.     Marland Kitchen oxyCODONE-acetaminophen (PERCOCET/ROXICET) 5-325 MG tablet Take 2 tablets by mouth every 4 (four) hours as needed for severe pain. (Patient taking differently: Take 1 tablet by mouth every 6 (six) hours as needed for severe pain (pain). ) 15 tablet 0  . gabapentin (NEURONTIN) 400 MG capsule Take 1 capsule (400 mg total) by mouth 2 (two) times daily. (Patient not taking: Reported on 12/01/2018) 60 capsule 0    No results found for this or any previous visit (from the past 48 hour(s)). No results found.  Pertinent items noted in HPI and remainder of comprehensive ROS otherwise negative.  Blood pressure 125/78, pulse 66, temperature 98.1 F (36.7 C), resp. rate 18, height 6\' 2"  (1.88 m), weight 105.2 kg, SpO2 97 %.  Patient is awake and alert.  He is oriented and appropriate.  Speech  is fluent.  Judgment insight are intact.  Cranial nerve function normal bilateral.  Motor examination extremities reveals some mild weakness of dorsiflexion on the right side otherwise motor strength intact.  Sensory examination with decrease sensation pinprick light touch in his right L5 dermatome.  Deep tender if is normal active.  There is no evidence of long track signs.  Gait antalgic.  Posture mildly flexed.  Examination head ears eyes nose throat is unremarked.  Chest and abdomen are benign.  Extremities are free from injury or deformity Assessment/Plan L4-5 recurrent herniated  nucleus pulposus with radiculopathy and severe disc degeneration.  Plan bilateral L4-5 laminotomy and microdiscectomy followed by posterior lumbar interbody fusion utilizing interbody cages, locally harvested autograft, and augmented with posterior lateral arthrodesis utilizing nonsegmental pedicle screw fixation and local autografting.  Risks and benefits of been explained.  Patient wishes to proceed.  Mallie Mussel A Connie Hilgert 12/08/2018, 7:45 AM

## 2018-12-08 NOTE — Op Note (Signed)
Date of procedure: 12/08/2018  Date of dictation: Same  Service: Neurosurgery  Preoperative diagnosis: Recurrent L4-5 herniated nucleus pulposus with radiculopathy and severe disc degeneration  Postoperative diagnosis: Same  Procedure Name: Bilateral L4-5 decompressive laminotomies and facetectomies with bilateral redo microdiscectomy  L4-5 posterior lumbar interbody fusion utilizing interbody cages and locally harvested autograft  L4-5 posterior lateral arthrodesis utilizing nonsegmental pedicle screw fixation and local autograft  Surgeon:Kyleeann Cremeans A.Fawne Hughley, M.D.  Asst. Surgeon: None  Anesthesia: General  Indication: 43 year old male status post 2 prior left-sided L4-5 laminotomies and microdiscectomies now presents with a large early recurrent disc herniation at L4-5 which is failed conservative management.  The space is progressively collapsing with increased Modic changes within the endplates.  I discussed situation with the patient.  Recommended we move forward with decompression and fusion in hopes of improving his symptoms.  Operative note: After induction anesthesia, patient position prone onto Wilson frame and properly padded.  Lumbar region prepped and draped sterilely.  Incision made overlying L4-5.  Dissection performed bilaterally.  Retractor placed.  Fluoroscopy used.  Levels confirmed.  Previous laminotomy site on the left at L4-5 was dissected free.  Bilateral decompressive laminotomies and facetectomies were performed using [facility] a high-speed drill to remove the inferior one half of the lamina of L4 the entire inferior facet and pars interarticularis of L4 bilaterally and the majority of the superior facet of L5 bilaterally.  A small amount of the superior aspect of the lamina of L5 was also removed.  Ligament flavum epidural scar were elevated and resected.  Underlying thecal sac and exiting L4 and L5 nerve roots were identified and decompressed bilaterally.   Bilateral microdiscectomy was then performed removing all elements of the recurrent disc herniation.  The spaces were then prepared for interbody fusion.  With a distractor placed the patient's right side to space was cleaned of all soft tissue.  A 9 mm [employer] expandable cage packed with locally harvested autograft was impacted into place and expanded to its full extent.  Distractor was removed patient's right side.  Dissipates then prepared on the right side.  Soft tissue was removed from the interspace.  Morselized autograft was packed into the interspace.  A second cage containing autograft was then impacted into place and expanded to its full extent.  Pedicles at L4 and L5 were identified using surface landmarks and intraoperative fluoroscopy.  Superficial bone around the pedicle was then removed using high-speed drill.  Each pedicle was then probed using a pedicle all each pedicle tract was then tapped with a screw tap.  Each screw temple was probed and found to be solidly within the bone.  5.75 mm radius brand screws from [employer] were placed bilaterally.  Final images with the fluoroscopy unit revealed good position of the cages and the hardware at the proper operative level with normal alignment of the spine.  Wound is irrigated one final time.  Short segment of titanium rods and placed over the screw heads at L4 and L5.  Locking caps placed over the screws.  Locking caps then engaged with a construct under mild compression.  Transverse processes and residual facets were decorticated.  Morselized autograft was packed posterior laterally for later fusion.  Gelfoam was placed over the laminotomy defects.  Vancomycin powder was placed in the deep wound space.  Wound is then closed in layers with Vicryl sutures.  Steri-Strips and sterile dressing were applied.  No apparent complications.  Patient tolerated the procedure well and he  returns to the recovery room postop.

## 2018-12-08 NOTE — Evaluation (Signed)
Physical Therapy Evaluation Patient Details Name: Andre Simmons MRN: [id number] DOB: September 25, 1975 Today's Date: 12/08/2018   History of Present Illness  Pt is a 43 y/o male s/p L4-5 PLIF. PMH includes back surgery and depression.   Clinical Impression  Patient is s/p above surgery resulting in the deficits listed below (see PT Problem List). Pt limited this session secondary to pain in back and RLE. Noted some buckling in RLE secondary to pain. Educated about back precautions and importance of mobility following surgery. Reports he plans to go stay with his mom at d/c. Patient will benefit from skilled PT to increase their independence and safety with mobility (while adhering to their precautions) to allow discharge to the venue listed below.     Follow Up Recommendations No PT follow up    Equipment Recommendations  Rolling walker with 5" wheels    Recommendations for Other Services       Precautions / Restrictions Precautions Precautions: Back Precaution Booklet Issued: Yes (comment) Precaution Comments: Reviewed back precautions with pt.  Required Braces or Orthoses: Spinal Brace Spinal Brace: Lumbar corset Restrictions Weight Bearing Restrictions: No      Mobility  Bed Mobility Overal bed mobility: Needs Assistance Bed Mobility: Rolling;Sidelying to Sit;Sit to Sidelying Rolling: Supervision Sidelying to sit: Supervision     Sit to sidelying: Supervision General bed mobility comments: Supervision for safety to ensure log roll technique. Pt requiring increased time secondary to pain.   Transfers Overall transfer level: Needs assistance Equipment used: 1 person hand held assist Transfers: Sit to/from Stand Sit to Stand: Min assist         General transfer comment: Min A for lift assist and steadying. Required cues for knee extension and full upright posture.   Ambulation/Gait Ambulation/Gait assistance: Min assist Gait Distance (Feet): 15 Feet Assistive  device: IV Pole Gait Pattern/deviations: Step-through pattern;Decreased stride length Gait velocity: Decreased    General Gait Details: Slow, unsteady gait. Pt with mild R knee buckling secondary to increased pain. Pt ambulated to bathroom and back and refused further mobility secondary to pain.   Stairs            Wheelchair Mobility    Modified Rankin (Stroke Patients Only)       Balance Overall balance assessment: Needs assistance Sitting-balance support: No upper extremity supported;Feet supported Sitting balance-Leahy Scale: Fair     Standing balance support: Single extremity supported;No upper extremity supported;During functional activity Standing balance-Leahy Scale: Poor Standing balance comment: Reliant on at least 1 UE and external support                              Pertinent Vitals/Pain Pain Assessment: Faces Faces Pain Scale: Hurts whole lot Pain Location: back Pain Descriptors / Indicators: Aching;Operative site guarding Pain Intervention(s): Limited activity within patient's tolerance;Monitored during session;Repositioned;Patient requesting pain meds-RN notified    Home Living Family/patient expects to be discharged to:: Private residence Living Arrangements: Alone Available Help at Discharge: Family;Available 24 hours/day Type of Home: House Home Access: Stairs to enter Entrance Stairs-Rails: None Entrance Stairs-Number of Steps: 3 Home Layout: One level Home Equipment: None Additional Comments: Pt reports he is planning to stay with his mom    Prior Function Level of Independence: Independent               Hand Dominance        Extremity/Trunk Assessment   Upper Extremity Assessment Upper Extremity Assessment: Defer  to OT evaluation    Lower Extremity Assessment Lower Extremity Assessment: RLE deficits/detail RLE Deficits / Details: Pt reports RLE pain. Noted some buckling during short distance gait.     Cervical  / Trunk Assessment Cervical / Trunk Assessment: Other exceptions Cervical / Trunk Exceptions: s/p lumbar surgery   Communication   Communication: No difficulties  Cognition Arousal/Alertness: Awake/alert Behavior During Therapy: WFL for tasks assessed/performed Overall Cognitive Status: Within Functional Limits for tasks assessed                                        General Comments General comments (skin integrity, edema, etc.): Educated about importance of mobility following surgery.     Exercises     Assessment/Plan    PT Assessment Patient needs continued PT services  PT Problem List Decreased strength;Decreased balance;Decreased activity tolerance;Decreased mobility;Decreased knowledge of use of DME;Decreased knowledge of precautions;Pain       PT Treatment Interventions Gait training;Stair training;Functional mobility training;Therapeutic activities;Therapeutic exercise;Balance training;Patient/family education    PT Goals (Current goals can be found in the Care Plan section)  Acute Rehab PT Goals Patient Stated Goal: to decrease pain  PT Goal Formulation: With patient Time For Goal Achievement: 12/22/18 Potential to Achieve Goals: Good    Frequency Min 5X/week   Barriers to discharge        Co-evaluation               AM-PAC PT "6 Clicks" Mobility  Outcome Measure Help needed turning from your back to your side while in a flat bed without using bedrails?: A Little Help needed moving from lying on your back to sitting on the side of a flat bed without using bedrails?: A Little Help needed moving to and from a bed to a chair (including a wheelchair)?: A Little Help needed standing up from a chair using your arms (e.g., wheelchair or bedside chair)?: A Little Help needed to walk in hospital room?: A Little Help needed climbing 3-5 steps with a railing? : A Little 6 Click Score: 18    End of Session   Activity Tolerance: Patient limited  by pain Patient left: in bed;with call bell/phone within reach Nurse Communication: Mobility status;Patient requests pain meds PT Visit Diagnosis: Unsteadiness on feet (R26.81);Other abnormalities of gait and mobility (R26.89);Pain Pain - Right/Left: Right Pain - part of body: Leg(back)    Time: [id number] PT Time Calculation (min) (ACUTE ONLY): 17 min   Charges:   PT Evaluation $PT Eval Low Complexity: [place], PT, DPT  Acute Rehabilitation Services  Pager: [phone] Office: [phone]   Rudean Hitt 12/08/2018, 3:23 PM

## 2018-12-08 NOTE — Anesthesia Postprocedure Evaluation (Signed)
Anesthesia Post Note  Patient: Andre Simmons  Procedure(s) Performed: Lumbar Four- Five Posterior Lumbar Interbody Fusion (N/A Back)     Patient location during evaluation: PACU Anesthesia Type: General Level of consciousness: awake and alert Pain management: pain level controlled Vital Signs Assessment: post-procedure vital signs reviewed and stable Respiratory status: spontaneous breathing, nonlabored ventilation, respiratory function stable and patient connected to nasal cannula oxygen Cardiovascular status: blood pressure returned to baseline and stable Postop Assessment: no apparent nausea or vomiting Anesthetic complications: no    Last Vitals:  Vitals:   12/08/18 1141 12/08/18 1205  BP: (!) 147/90 129/78  Pulse:  61  Resp:    Temp:  36.4 C  SpO2:  94%    Last Pain:  Vitals:   12/08/18 1319  TempSrc:   PainSc: 10-Worst pain ever                 Effie Berkshire

## 2018-12-08 NOTE — Anesthesia Procedure Notes (Signed)
Procedure Name: Intubation Date/Time: 12/08/2018 8:10 AM Performed by: Shirlyn Goltz, CRNA Pre-anesthesia Checklist: Patient identified, Emergency Drugs available, Suction available and Patient being monitored Patient Re-evaluated:Patient Re-evaluated prior to induction Oxygen Delivery Method: Circle system utilized Preoxygenation: Pre-oxygenation with 100% oxygen Induction Type: IV induction Ventilation: Mask ventilation without difficulty Laryngoscope Size: Mac and 4 Grade View: Grade II Tube type: Oral Tube size: 7.5 mm Number of attempts: 1 Airway Equipment and Method: Stylet Placement Confirmation: ETT inserted through vocal cords under direct vision,  positive ETCO2 and breath sounds checked- equal and bilateral Secured at: 23 cm Tube secured with: Tape Dental Injury: Injury to lip

## 2018-12-08 NOTE — Transfer of Care (Signed)
Immediate Anesthesia Transfer of Care Note  Patient: Andre Simmons  Procedure(s) Performed: Lumbar Four- Five Posterior Lumbar Interbody Fusion (N/A Back)  Patient Location: PACU  Anesthesia Type:General  Level of Consciousness: awake, alert , oriented and patient cooperative  Airway & Oxygen Therapy: Patient Spontanous Breathing and Patient connected to face mask oxygen  Post-op Assessment: Report given to RN and Post -op Vital signs reviewed and stable  Post vital signs: Reviewed and stable  Last Vitals:  Vitals Value Taken Time  BP    Temp    Pulse 81 12/08/18 1055  Resp 12 12/08/18 1055  SpO2 94 % 12/08/18 1055  Vitals shown include unvalidated device data.  Last Pain:  Vitals:   12/08/18 0724  PainSc: 9       Patients Stated Pain Goal: 3 ([id number])  Complications: No apparent anesthesia complications

## 2018-12-09 MED ORDER — HYDROMORPHONE HCL 2 MG PO TABS: 2.0000 mg | ORAL_TABLET | ORAL | 0 refills | Status: DC | PRN

## 2018-12-09 MED ORDER — DIAZEPAM 5 MG PO TABS: 5.0000 mg | ORAL_TABLET | Freq: Four times a day (QID) | ORAL | 0 refills | Status: DC | PRN

## 2018-12-09 MED ORDER — PANTOPRAZOLE SODIUM 40 MG PO TBEC
40.0000 mg | DELAYED_RELEASE_TABLET | Freq: Once | ORAL | Status: AC
  Administered 2018-12-09: 40 mg via ORAL

## 2018-12-09 NOTE — Progress Notes (Signed)
Occupational Therapy Evaluation Patient Details Name: Andre Simmons MRN: [id number] DOB: 01-21-1976 Today's Date: 12/09/2018    History of Present Illness Pt is a 43 y/o male s/p L4-5 PLIF. PMH includes back surgery and depression.    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 Pt reports he will be going to stay with his mom upon dc for a few weeks. Pt currently presents with decreased strength, ROM, activity tolerance, knowledge of use of AE, and increased pain. Provided education regarding brace management, compensatory techniques, and use of AE for ADLs. Pt performed dressing, brace management, and sit<>stand with supervision- min guard A with VCs for safety and use of compensatory techniques. Recommend dc home with no OT follow up once medically stable per MD. All education and acute needs met, will sign off.     Follow Up Recommendations  No OT follow up    Equipment Recommendations  3 in 1 bedside commode    Recommendations for Other Services PT consult     Precautions / Restrictions Precautions Precautions: Back Precaution Booklet Issued: Yes (comment) Precaution Comments: recalled 2/3 precautions, reviewed all precautions with pt Required Braces or Orthoses: Spinal Brace Spinal Brace: Lumbar corset Restrictions Weight Bearing Restrictions: No      Mobility Bed Mobility Overal bed mobility: Needs Assistance Bed Mobility: Rolling;Sidelying to Sit Rolling: Supervision Sidelying to sit: Supervision       General bed mobility comments: Supervision for safety to ensure log roll technique. Pt requiring increased time secondary to pain.   Transfers Overall transfer level: Needs assistance Equipment used: None Transfers: Sit to/from Stand Sit to Stand: Min guard         General transfer comment: min guard A for power up    Balance Overall balance assessment: No  apparent balance deficits (not formally assessed)                                         ADL either performed or assessed with clinical judgement   ADL Overall ADL's : Needs assistance/impaired Eating/Feeding: Independent;Sitting   Grooming: Supervision/safety;Standing Grooming Details (indicate cue type and reason): provided education regarding compensatory techniques for oral care Upper Body Bathing: Supervision/ safety;Sitting   Lower Body Bathing: With adaptive equipment;Sit to/from stand;Min guard   Upper Body Dressing : Supervision/safety;Sitting Upper Body Dressing Details (indicate cue type and reason): provided education for brace management. required supervision for safety Lower Body Dressing: Min guard;Cueing for compensatory techniques;With adaptive equipment;Sit to/from stand Lower Body Dressing Details (indicate cue type and reason): provided education regarding use of AE for LB ADLs. required min guard A for safety Toilet Transfer: Ambulation;Min guard Toilet Transfer Details (indicate cue type and reason): min guard A for sit<>stand Toileting- Clothing Manipulation and Hygiene: Min guard;Sit to/from stand       Functional mobility during ADLs: Supervision/safety General ADL Comments: Pt required supervision for UB ADLs, min guard A for LB ADLS. provided education regarding compensatory strategies and use of AE     Vision Baseline Vision/History: No visual deficits Patient Visual Report: No change from baseline       Perception     Praxis      Pertinent Vitals/Pain Pain Assessment: 0-10 Pain Score: 9  Pain Location: back Pain Descriptors / Indicators: Aching;Constant;Discomfort;Grimacing;Guarding;Operative site guarding;Sore Pain Intervention(s):  Monitored during session;Premedicated before session;Repositioned     Hand Dominance Right   Extremity/Trunk Assessment Upper Extremity Assessment Upper Extremity Assessment: Overall WFL for  tasks assessed   Lower Extremity Assessment Lower Extremity Assessment: Defer to PT evaluation   Cervical / Trunk Assessment Cervical / Trunk Assessment: Other exceptions Cervical / Trunk Exceptions: s/p lumbar surgery    Communication Communication Communication: No difficulties   Cognition Arousal/Alertness: Awake/alert Behavior During Therapy: WFL for tasks assessed/performed Overall Cognitive Status: Within Functional Limits for tasks assessed                                     General Comments       Exercises     Shoulder Instructions      Home Living Family/patient expects to be discharged to:: Private residence Living Arrangements: Alone Available Help at Discharge: Family;Available 24 hours/day Type of Home: House Home Access: Level entry     Home Layout: One level     Bathroom Shower/Tub: Tub/shower unit;Walk-in shower   Bathroom Toilet: Standard     Home Equipment: None   Additional Comments: Pt reports he is planning to stay with his mom upon dc      Prior Functioning/Environment Level of Independence: Independent        Comments: Pt is single dad and cares for two children, 41 yo and 39 yo        OT Problem List: Decreased strength;Decreased range of motion;Decreased activity tolerance;Decreased safety awareness;Decreased knowledge of use of DME or AE;Decreased knowledge of precautions;Pain      OT Treatment/Interventions:      OT Goals(Current goals can be found in the care plan section) Acute Rehab OT Goals Patient Stated Goal: to decrease pain  OT Goal Formulation: All assessment and education complete, DC therapy  OT Frequency:     Barriers to D/C:            Co-evaluation              AM-PAC OT "6 Clicks" Daily Activity     Outcome Measure Help from another person eating meals?: None Help from another person taking care of personal grooming?: None Help from another person toileting, which includes using  toliet, bedpan, or urinal?: A Little Help from another person bathing (including washing, rinsing, drying)?: A Little Help from another person to put on and taking off regular upper body clothing?: None Help from another person to put on and taking off regular lower body clothing?: A Little 6 Click Score: 21   End of Session Equipment Utilized During Treatment: Back brace Nurse Communication: Mobility status  Activity Tolerance: Patient tolerated treatment well;Patient limited by pain Patient left: in bed;with call bell/phone within reach  OT Visit Diagnosis: Muscle weakness (generalized) (M62.81);Pain Pain - part of body: (surgical site, low back)                Time: 0822-0852 OT Time Calculation (min): 30 min Charges:  OT General Charges $OT Visit: 1 Visit OT Evaluation $OT Eval Low Complexity: 1 Low OT Treatments $Self Care/Home Management : 8-22 mins  Gus Rankin, OT Student  Gus Rankin 12/09/2018, 9:27 AM

## 2018-12-09 NOTE — Discharge Instructions (Signed)

## 2018-12-09 NOTE — Progress Notes (Signed)
Pt given D/C instructions with verbal understanding. Rx's were sent to pharmacy by MD. Pt's incision is clean and dry with no sign of infection. Pt's IV was removed prior to D/C. Pt received 3-n-1 from [facility] per MD order. Pt D/C'd home via walking @ 1130 per MD order. Pt is stable @ D/C and has no other needs at this time. Holli Humbles, RN

## 2018-12-09 NOTE — Discharge Summary (Signed)
Physician Discharge Summary  Patient ID: Andre Simmons MRN: [id number] DOB/AGE: October 22, 1975 43 y.o.  Admit date: 12/08/2018 Discharge date: 12/09/2018  Admission Diagnoses:  Discharge Diagnoses:  Active Problems:   Recurrent herniation of lumbar disc   Discharged Condition: good  Hospital Course: Patient admitted to the hospital where he underwent uncomplicated [id number] decompression and fusion for treatment of his symptomatic recurrent disc herniation.  Postoperatively doing well.  Preoperative lower extremity symptoms completely resolved.  Patient with moderate back pain.  Mobilizing without difficulty.  Ready for discharge home.  Consults:   Significant Diagnostic Studies:   Treatments:   Discharge Exam: Blood pressure 126/74, pulse 81, temperature 98.7 F (37.1 C), temperature source Oral, resp. rate 18, height 6\' 2"  (1.88 m), weight 105.2 kg, SpO2 99 %. Awake and alert.  Oriented and appropriate.  Speech fluent.  Judgment insight intact.  Cranial nerve function normal bilateral.  Motor and sensory function extremities normal.  Wound clean and dry.  Chest and abdomen benign.  Disposition: Discharge disposition: 01-Home or Self Care        Allergies as of 12/09/2018      Reactions   Adhesive [tape] Rash, Other (See Comments)   Skin irritation.      Medication List    TAKE these medications   diazepam 5 MG tablet Commonly known as: VALIUM Take 1-2 tablets (5-10 mg total) by mouth every 6 (six) hours as needed for muscle spasms.   gabapentin 400 MG capsule Commonly known as: NEURONTIN Take 1 capsule (400 mg total) by mouth 2 (two) times daily.   HYDROmorphone 2 MG tablet Commonly known as: Dilaudid Take 1 tablet (2 mg total) by mouth every 4 (four) hours as needed for severe pain.   omeprazole 40 MG capsule Commonly known as: PRILOSEC Take 40 mg by mouth 2 (two) times daily.   oxyCODONE-acetaminophen 5-325 MG tablet Commonly known as:  PERCOCET/ROXICET Take 2 tablets by mouth every 4 (four) hours as needed for severe pain. What changed:   how much to take  when to take this  reasons to take this            Durable Medical Equipment  (From admission, onward)         Start     Ordered   12/08/18 1159  DME Walker rolling  Once    Question:  Patient needs a walker to treat with the following condition  Answer:  Recurrent herniation of lumbar disc   12/08/18 1158   12/08/18 1159  DME 3 n 1  Once     12/08/18 1158           Signed: Mallie Mussel A Anaaya Fuster 12/09/2018, 7:57 AM

## 2018-12-09 NOTE — Progress Notes (Signed)
Physical Therapy Treatment Patient Details Name: Andre Simmons MRN: [id number] DOB: October 07, 1975 Today's Date: 12/09/2018    History of Present Illness Pt is a 43 y/o male s/p L4-5 PLIF. PMH includes back surgery and depression.     PT Comments    Pt progressing well with post-op mobility. He was able to demonstrate transfers and ambulation with gross supervision for safety. Education reinforced on precautions, brace application/wearing schedule, appropriate activity progression, and car transfer. Will continue to follow.     Follow Up Recommendations  No PT follow up;Supervision - Intermittent     Equipment Recommendations  Rolling walker with 5" wheels    Recommendations for Other Services       Precautions / Restrictions Precautions Precautions: Back Precaution Booklet Issued: Yes (comment) Precaution Comments: recalled 2/3 precautions, reviewed all precautions with pt Required Braces or Orthoses: Spinal Brace Spinal Brace: Lumbar corset Restrictions Weight Bearing Restrictions: No    Mobility  Bed Mobility Overal bed mobility: Needs Assistance Bed Mobility: Rolling;Sidelying to Sit Rolling: Modified independent (Device/Increase time) Sidelying to sit: Supervision     Sit to sidelying: Supervision;Modified independent (Device/Increase time) General bed mobility comments: Supervision and cues to ensure proper log roll. Pt practiced x2 and progressed to a mod I level by end of session.   Transfers Overall transfer level: Needs assistance Equipment used: None Transfers: Sit to/from Stand Sit to Stand: Supervision         General transfer comment: Close supervision for safety as pt powered up to full stand. Increased time required but pt able to complete without assist.   Ambulation/Gait Ambulation/Gait assistance: Supervision Gait Distance (Feet): 400 Feet Assistive device: None Gait Pattern/deviations: Step-through pattern;Decreased stride length Gait  velocity: Decreased  Gait velocity interpretation: <1.8 ft/sec, indicate of risk for recurrent falls General Gait Details: Very slow and guarded. Noted some unsteadiness at times but feel this was likely 2 slow speed as noted increased ankle strategy to keep balanced with each step.    Stairs             Wheelchair Mobility    Modified Rankin (Stroke Patients Only)       Balance Overall balance assessment: No apparent balance deficits (not formally assessed) Sitting-balance support: No upper extremity supported;Feet supported Sitting balance-Leahy Scale: Fair     Standing balance support: No upper extremity supported Standing balance-Leahy Scale: Fair                              Cognition Arousal/Alertness: Awake/alert Behavior During Therapy: WFL for tasks assessed/performed Overall Cognitive Status: Within Functional Limits for tasks assessed                                        Exercises      General Comments        Pertinent Vitals/Pain Pain Assessment: 0-10 Pain Score: 8  Pain Location: back Pain Descriptors / Indicators: Operative site guarding;Grimacing;Tightness Pain Intervention(s): Limited activity within patient's tolerance;Monitored during session;Repositioned    Home Living Family/patient expects to be discharged to:: Private residence Living Arrangements: Alone Available Help at Discharge: Family;Available 24 hours/day Type of Home: House Home Access: Level entry Entrance Stairs-Rails: None Home Layout: One level Home Equipment: None Additional Comments: Pt reports he is planning to stay with his mom upon dc    Prior Function Level of  Independence: Independent      Comments: Pt is single dad and cares for two children, 79 yo and 65 yo   PT Goals (current goals can now be found in the care plan section) Acute Rehab PT Goals Patient Stated Goal: to decrease pain  PT Goal Formulation: With patient Time For  Goal Achievement: 12/22/18 Potential to Achieve Goals: Good Progress towards PT goals: Progressing toward goals    Frequency    Min 5X/week      PT Plan Current plan remains appropriate    Co-evaluation              AM-PAC PT "6 Clicks" Mobility   Outcome Measure  Help needed turning from your back to your side while in a flat bed without using bedrails?: A Little Help needed moving from lying on your back to sitting on the side of a flat bed without using bedrails?: A Little Help needed moving to and from a bed to a chair (including a wheelchair)?: A Little Help needed standing up from a chair using your arms (e.g., wheelchair or bedside chair)?: A Little Help needed to walk in hospital room?: A Little Help needed climbing 3-5 steps with a railing? : A Little 6 Click Score: 18    End of Session Equipment Utilized During Treatment: Gait belt Activity Tolerance: Patient limited by pain Patient left: in bed;with call bell/phone within reach Nurse Communication: Mobility status;Patient requests pain meds PT Visit Diagnosis: Unsteadiness on feet (R26.81);Other abnormalities of gait and mobility (R26.89);Pain Pain - Right/Left: Right Pain - part of body: Leg(back)     Time: [id number] PT Time Calculation (min) (ACUTE ONLY): 25 min  Charges:  $Gait Training: 23-37 mins                     Rolinda Roan, PT, DPT Acute Rehabilitation Services Pager: [phone] Office: [phone]    Thelma Comp 12/09/2018, 10:50 AM

## 2018-12-11 MED FILL — Sodium Chloride IV Soln 0.9%: INTRAVENOUS | Qty: 2000 | Status: AC

## 2018-12-11 MED FILL — Heparin Sodium (Porcine) Inj 1000 Unit/ML: INTRAMUSCULAR | Qty: 30 | Status: AC

## 2018-12-18 ENCOUNTER — Encounter (HOSPITAL_COMMUNITY): Payer: Self-pay | Admitting: Emergency Medicine

## 2018-12-18 ENCOUNTER — Emergency Department (HOSPITAL_COMMUNITY)
Admission: EM | Admit: 2018-12-18 | Discharge: 2018-12-18 | Disposition: A | Discharge status: Home / Self Care | Payer: Medicare Other | Attending: Emergency Medicine | Admitting: Emergency Medicine

## 2018-12-18 DIAGNOSIS — G8918 Other acute postprocedural pain: Secondary | ICD-10-CM

## 2018-12-18 DIAGNOSIS — M79605 Pain in left leg: Secondary | ICD-10-CM

## 2018-12-18 DIAGNOSIS — Z87891 Personal history of nicotine dependence: Secondary | ICD-10-CM | POA: Diagnosis not present

## 2018-12-18 DIAGNOSIS — Z79899 Other long term (current) drug therapy: Secondary | ICD-10-CM | POA: Diagnosis not present

## 2018-12-18 MED ORDER — ONDANSETRON HCL 4 MG/2ML IJ SOLN
4.0000 mg | Freq: Once | INTRAMUSCULAR | Status: AC
  Administered 2018-12-18: 4 mg via INTRAVENOUS
  Filled 2018-12-18: qty 2

## 2018-12-18 MED ORDER — KETOROLAC TROMETHAMINE 30 MG/ML IJ SOLN
30.0000 mg | Freq: Once | INTRAMUSCULAR | Status: AC
  Administered 2018-12-18: 30 mg via INTRAVENOUS
  Filled 2018-12-18: qty 1

## 2018-12-18 MED ORDER — PREDNISONE 10 MG (21) PO TBPK: ORAL_TABLET | Freq: Every day | ORAL | 0 refills | Status: DC

## 2018-12-18 MED ORDER — HYDROMORPHONE HCL 1 MG/ML IJ SOLN
1.0000 mg | Freq: Once | INTRAMUSCULAR | Status: AC
  Administered 2018-12-18: 1 mg via INTRAVENOUS
  Filled 2018-12-18: qty 1

## 2018-12-18 MED ORDER — DEXAMETHASONE SODIUM PHOSPHATE 10 MG/ML IJ SOLN
10.0000 mg | Freq: Once | INTRAMUSCULAR | Status: AC
  Administered 2018-12-18: 10 mg via INTRAVENOUS
  Filled 2018-12-18: qty 1

## 2018-12-18 MED ORDER — OXYCODONE-ACETAMINOPHEN 5-325 MG PO TABS: 1.0000 | ORAL_TABLET | ORAL | Status: DC | PRN

## 2018-12-18 NOTE — ED Notes (Signed)
Pt declined triage percocet again for pain relief. Pt states he is worried the dr. Aletha Halim not give him anything more if he takes it and he take dilaudid at home.

## 2018-12-18 NOTE — ED Triage Notes (Signed)
Pt reports spinal fusion on 10/13, had f/u with neurosurgeon yesterday and reported that he was having new pain to L leg which provider reassured him was normal, states that this morning he woke up with his L left feeling numb. Pt reports he was lying on his back when he woke up, states that numbness has subsided but tingling and pain are still present to anterior and posterior L thigh. Pt took dilaudid at 1230 last night.

## 2018-12-18 NOTE — ED Provider Notes (Signed)
[facility] EMERGENCY DEPARTMENT Provider Note   CSN: [id number] Arrival date & time: 12/18/18  [id number]     History   Chief Complaint Chief Complaint  Patient presents with  . Leg Pain  . Post-op Problem    HPI Andre Simmons is a 43 y.o. male with past medical history significant for arthritis, chronic back pain, GERD, neuroendocrine pancreatic tumor, recurrent herniation of lumbar disc presents to emergency department today with chief complaint of left leg pain.  Patient has history of multiple back surgeries.  He most recently had Lumbar Four- Five Posterior Lumbar Interbody Fusion with Dr. Annette Stable on 10/13. Having post op pain usually controlled with home meds. Saw Dr. Annette Stable in office yesterday for post op check.   At 3am this morning he woke up and had severe pain to left thigh and numbness and tingling of left leg. He states when getting out of bed he tried to walk and left leg was numb and he almost fell to the ground but was able to catch himself on the bed rail.  He states this pain was more severe than his typical postop pain has been.  He rated the pain 9 of 10 in severity.  His last dose of pain medication was at p.o. Dilaudid at 1230 this morning.   Denies fevers, bowel/bladder incontinence, urinary retention, saddle anesthesia, history of IVDA.   Past Medical History:  Diagnosis Date  . Arthritis   . Chronic back pain   . Depression   . GERD (gastroesophageal reflux disease)   . Knee pain   . Pancreatic tumor     Patient Active Problem List   Diagnosis Date Noted  . Recurrent herniation of lumbar disc 12/08/2018  . Abdominal pain 06/25/2018  . Neuroendocrine tumor of pancreas 11/25/2017  . Acute pancreatitis 09/26/2017  . Chronic back pain   . Nausea and vomiting in adult patient   . Abnormal MRI 09/18/2017  . Pancreatic lesion 09/18/2017  . S/P ACL reconstruction 04/14/2014  . Sprain of medial collateral ligament of left knee 03/03/2014  .  Primary osteoarthritis of left knee 11/18/2013  . Complete tear of anterior cruciate ligament of left knee 11/18/2013    Past Surgical History:  Procedure Laterality Date  . BACK SURGERY  03/2018   Dr Trenton Gammon.  L4-L5, S1 micro discectomy  . EUS N/A 09/25/2017   Procedure: UPPER ENDOSCOPIC ULTRASOUND (EUS) LINEAR;  Surgeon: Milus Banister, MD;  Location: [facility];  Service: Endoscopy;  Laterality: N/A;  Radial and Linear  . FINE NEEDLE ASPIRATION  09/25/2017   Procedure: FINE NEEDLE ASPIRATION (FNA) RADIAL;  Surgeon: Milus Banister, MD;  Location: [facility];  Service: Endoscopy;;  . KNEE SURGERY Left    x 20 surgeries  . LAPAROSCOPIC DISTAL PANCREATECTOMY  11/26/2017   Diagnostic laparoscopy, laparoscopic hand-assisted distal   . MOUTH SURGERY     as a child, unsure if wisdom teeth or tonsils  . PANCREATECTOMY N/A 11/25/2017   Procedure: LAPAROSCOPIC DISTAL PANCREATECTOMY AND SPLENECTOMY ERAS PATHWAY;  Surgeon: Stark Klein, MD;  Location: [facility];  Service: General;  Laterality: N/A;  . Removal of Spleen  2019        Home Medications    Prior to Admission medications   Medication Sig Start Date End Date Taking? Authorizing Provider  diazepam (VALIUM) 5 MG tablet Take 1-2 tablets (5-10 mg total) by mouth every 6 (six) hours as needed for muscle spasms. 12/09/18   Earnie Larsson, MD  gabapentin (NEURONTIN) 400 MG capsule Take 1 capsule (400 mg total) by mouth 2 (two) times daily. Patient not taking: Reported on 12/01/2018 06/30/18   Cristal Ford, DO  HYDROmorphone (DILAUDID) 2 MG tablet Take 1 tablet (2 mg total) by mouth every 4 (four) hours as needed for severe pain. 12/09/18   Earnie Larsson, MD  omeprazole (PRILOSEC) 40 MG capsule Take 40 mg by mouth 2 (two) times daily.     [provider]  oxyCODONE-acetaminophen (PERCOCET/ROXICET) 5-325 MG tablet Take 2 tablets by mouth every 4 (four) hours as needed for severe pain. Patient taking differently: Take 1 tablet by mouth  every 6 (six) hours as needed for severe pain (pain).  03/15/18   Ward, Ozella Almond, PA-C  predniSONE (STERAPRED UNI-PAK 21 TAB) 10 MG (21) TBPK tablet Take by mouth daily. Take 6 tabs by mouth daily  for 1 day, then 5 tabs for 1 day, then 4 tabs for 1 day, then 3 tabs for 1 day, 2 tabs for 1 day, then 1 tab by mouth daily for 1 day 12/18/18   Marlis Oldaker, Harley Hallmark, PA-C    Family History Family History  Problem Relation Age of Onset  . Thyroid cancer Mother   . Other Father        MVA at age 49  . Esophageal cancer Maternal Grandmother        smoker  . Breast cancer Maternal Grandmother   . Esophageal cancer Paternal Grandmother   . Colon cancer Neg Hx   . Rectal cancer Neg Hx     Social History Social History   Tobacco Use  . Smoking status: Former Research scientist (life sciences)  . Smokeless tobacco: Never Used  . Tobacco comment: Quit 14-15 years ago (as of 2019)  Substance Use Topics  . Alcohol use: No  . Drug use: Yes    Types: Psilocybin     Allergies   Adhesive [tape]   Review of Systems Review of Systems  Constitutional: Negative for chills and fever.  HENT: Negative for congestion, rhinorrhea, sinus pressure and sore throat.   Eyes: Negative for pain and redness.  Respiratory: Negative for cough, shortness of breath and wheezing.   Cardiovascular: Negative for chest pain and palpitations.  Gastrointestinal: Negative for abdominal pain, constipation, diarrhea, nausea and vomiting.  Genitourinary: Negative for dysuria.  Musculoskeletal: Positive for back pain (chronic). Negative for arthralgias, myalgias and neck pain.  Skin: Negative for rash and wound.  Neurological: Positive for weakness and numbness. Negative for dizziness, syncope and headaches.  Psychiatric/Behavioral: Negative for confusion.     Physical Exam Updated Vital Signs BP 113/90 (BP Location: Right Arm)   Pulse 97   Temp (!) 97.5 F (36.4 C) (Oral)   Resp 18   SpO2 100%   Physical Exam Vitals signs and  nursing note reviewed.  Constitutional:      General: He is not in acute distress.    Appearance: He is not ill-appearing.  HENT:     Head: Normocephalic and atraumatic.     Right Ear: Tympanic membrane and external ear normal.     Left Ear: Tympanic membrane and external ear normal.     Nose: Nose normal.     Mouth/Throat:     Mouth: Mucous membranes are moist.     Pharynx: Oropharynx is clear.  Eyes:     General: No scleral icterus.       Right eye: No discharge.        Left eye: No discharge.  Extraocular Movements: Extraocular movements intact.     Conjunctiva/sclera: Conjunctivae normal.     Pupils: Pupils are equal, round, and reactive to light.  Neck:     Musculoskeletal: Normal range of motion.     Vascular: No JVD.     Comments: Full ROM intact without spinous process TTP. No bony stepoffs or deformities, no paraspinous muscle TTP or muscle spasms. No bruising, erythema, or swelling.  Cardiovascular:     Rate and Rhythm: Normal rate and regular rhythm.     Pulses: Normal pulses.          Radial pulses are 2+ on the right side and 2+ on the left side.     Heart sounds: Normal heart sounds.  Pulmonary:     Comments: Lungs clear to auscultation in all fields. Symmetric chest rise. No wheezing, rales, or rhonchi. Abdominal:     Comments: Abdomen is soft, non-distended, and non-tender in all quadrants. No rigidity, no guarding. No peritoneal signs.  Musculoskeletal: Normal range of motion.     Comments: No tenderness to palpation of the spinous processes of the T-spine or L-spine No crepitus, deformity or step-offs No tenderness to palpation of the paraspinous muscles of the L-spine      Skin:    General: Skin is warm and dry.     Capillary Refill: Capillary refill takes less than 2 seconds.       Neurological:     Mental Status: He is oriented to person, place, and time.     GCS: GCS eye subscore is 4. GCS verbal subscore is 5. GCS motor subscore is 6.      Comments: Speech is clear and goal oriented, follows commands CN III-XII intact, no facial droop Normal strength in upper and lower extremities bilaterally including dorsiflexion and plantar flexion, strong and equal grip strength Sensation normal to light and sharp touch Moves extremities without ataxia, coordination intact Normal finger to nose and rapid alternating movements Pt walks with antalgic gait   Psychiatric:        Behavior: Behavior normal.      ED Treatments / Results  Labs (all labs ordered are listed, but only abnormal results are displayed) Labs Reviewed - No data to display  EKG None  Radiology No results found.  Procedures Procedures (including critical care time)  Medications Ordered in ED Medications  dexamethasone (DECADRON) injection 10 mg (has no administration in time range)  HYDROmorphone (DILAUDID) injection 1 mg (1 mg Intravenous Given 12/18/18 1133)  ondansetron (ZOFRAN) injection 4 mg (4 mg Intravenous Given 12/18/18 1133)     Initial Impression / Assessment and Plan / ED Course  I have reviewed the triage vital signs and the nursing notes.  Pertinent labs & imaging results that were available during my care of the patient were reviewed by me and considered in my medical decision making (see chart for details).  Patient seen and examined. Patient nontoxic appearing, in no apparent distress, vitals WNL. He has a back brace that he wears when walking. He wore this to transfer to bed. He walks with antalgic gait but appears steady and does not need assistance. Neuro exam is without focal deficit. Numbness has resolved without intervention by the time of my exam. Surgical incision is well appearing, no signs of infection. No findings on exam to suggest cauda equina. Case discussed with neurosurgeon Dr Annette Stable who recommends dose of IV steroids and discharge home with steroid taper as this is a common post op symptom  and presentation. No imaging  recommend at this time.  The patient appears reasonably screened and/or stabilized for discharge and I doubt any other medical condition or other [facility] requiring further screening, evaluation, or treatment in the ED at this time prior to discharge. The patient is safe for discharge with strict return precautions discussed. He agrees with plan of care.  Recommend neurosurgery follow up, pt has appointment already scheduled for 01/06/2019. He will reschedule to be sooner if needed. Findings and plan of care discussed with supervising physician Dr. Gilford Raid.   Portions of this note were generated with [employer]. Dictation errors may occur despite best attempts at proofreading.     Final Clinical Impressions(s) / ED Diagnoses   Final diagnoses:  Post-op pain  Left leg pain    ED Discharge Orders         Ordered    predniSONE (STERAPRED UNI-PAK 21 TAB) 10 MG (21) TBPK tablet  Daily,   Status:  Discontinued     12/18/18 1239    predniSONE (STERAPRED UNI-PAK 21 TAB) 10 MG (21) TBPK tablet  Daily     12/18/18 1304           Cherre Robins, PA-C 12/18/18 1309    Isla Pence, MD 12/18/18 1435

## 2018-12-18 NOTE — Discharge Instructions (Addendum)
You have been seen today for left leg pain and numbness Please read and follow all provided instructions. Return to the emergency room for worsening condition or new concerning symptoms.    1. Medications:  Prescription sent to your pharmacy for a steroid dose pack. Please take as prescribed. This is a taper and you should take them as prescribed. Continue usual home medications Take medications as prescribed. Please review all of the medicines and only take them if you do not have an allergy to them.   2. Treatment: rest, drink plenty of fluids  3. Follow Up: Please follow up with your primary doctor in 2-5 days for discussion of your diagnoses and further evaluation after today's visit if needed. -Follow up with your neurosurgeon an your next scheduled appointment or sooner if needed  ?

## 2018-12-18 NOTE — ED Notes (Signed)
Patient declined triage order set percocet at this time.

## 2019-01-06 DIAGNOSIS — Z6828 Body mass index (BMI) 28.0-28.9, adult: Secondary | ICD-10-CM | POA: Diagnosis not present

## 2019-01-06 DIAGNOSIS — R030 Elevated blood-pressure reading, without diagnosis of hypertension: Secondary | ICD-10-CM | POA: Diagnosis not present

## 2019-01-06 DIAGNOSIS — M5416 Radiculopathy, lumbar region: Secondary | ICD-10-CM | POA: Diagnosis not present

## 2019-02-04 DIAGNOSIS — M5416 Radiculopathy, lumbar region: Secondary | ICD-10-CM | POA: Diagnosis not present

## 2019-03-01 DIAGNOSIS — Z20828 Contact with and (suspected) exposure to other viral communicable diseases: Secondary | ICD-10-CM | POA: Diagnosis not present

## 2019-04-01 DIAGNOSIS — M5416 Radiculopathy, lumbar region: Secondary | ICD-10-CM | POA: Diagnosis not present

## 2019-10-10 DIAGNOSIS — K8689 Other specified diseases of pancreas: Secondary | ICD-10-CM | POA: Diagnosis not present

## 2019-10-10 DIAGNOSIS — K859 Acute pancreatitis without necrosis or infection, unspecified: Secondary | ICD-10-CM | POA: Diagnosis not present

## 2019-10-10 DIAGNOSIS — K5730 Diverticulosis of large intestine without perforation or abscess without bleeding: Secondary | ICD-10-CM | POA: Diagnosis not present

## 2019-10-10 DIAGNOSIS — I700 Atherosclerosis of aorta: Secondary | ICD-10-CM | POA: Diagnosis not present

## 2020-01-21 IMAGING — CT CT ABDOMEN AND PELVIS WITH CONTRAST
2 of 5 series · 15 of 46 positions shown, 17 images · IV contrast (OMNIPAQUE)
Comparison: CT of the abdomen and pelvis 11/03/2017

CLINICAL DATA: Abdominal pain and nausea for 1 day. Surgery for
resection of 4 cm solid pseudo papillary neoplasm of the distal
pancreas November 2017. Patient states symptoms are similar to prior
to surgery.

EXAM:
CT ABDOMEN AND PELVIS WITH CONTRAST
TECHNIQUE: Multidetector CT imaging of the abdomen and pelvis was performed
using the standard protocol following bolus administration of
intravenous contrast.
CONTRAST:  100mL OMNIPAQUE IOHEXOL 300 MG/ML  SOLN

[Series 2: axial st · axial · 0.74mm/px · z∈[+1204,+1650]mm · 12 of 103 slices shown, 14 images]
[im 7/103  soft-tissue]
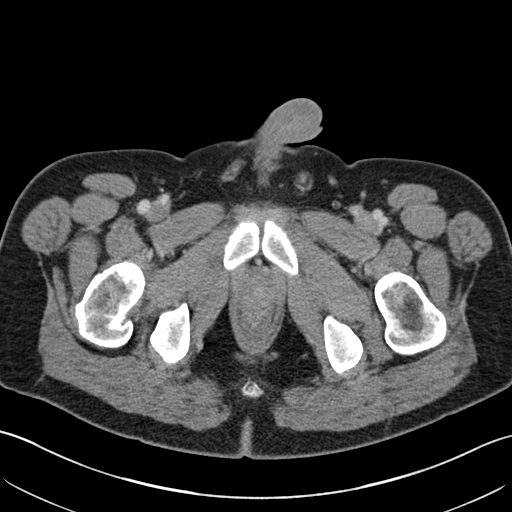
[im 7/103  bone]
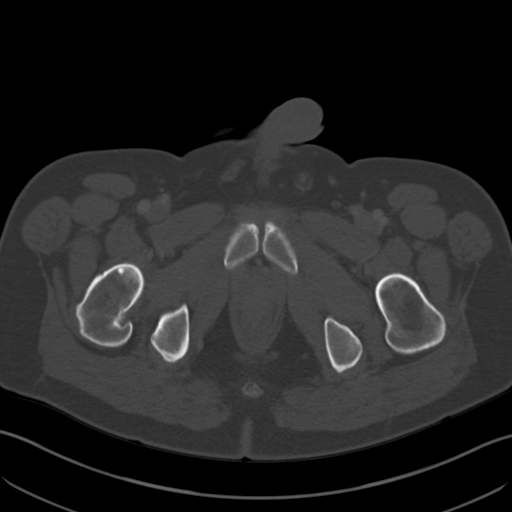
[im 13/103  soft-tissue]
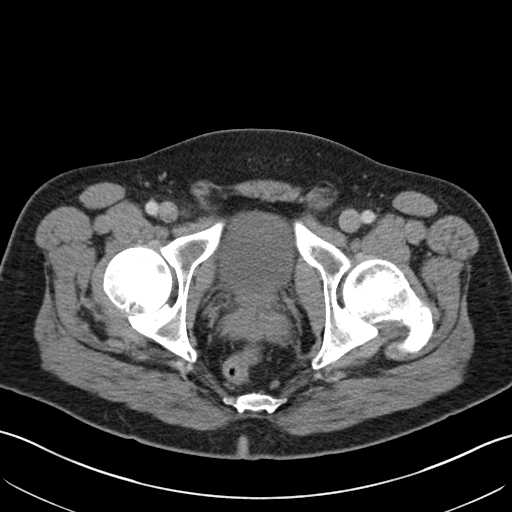
[im 26/103  soft-tissue]
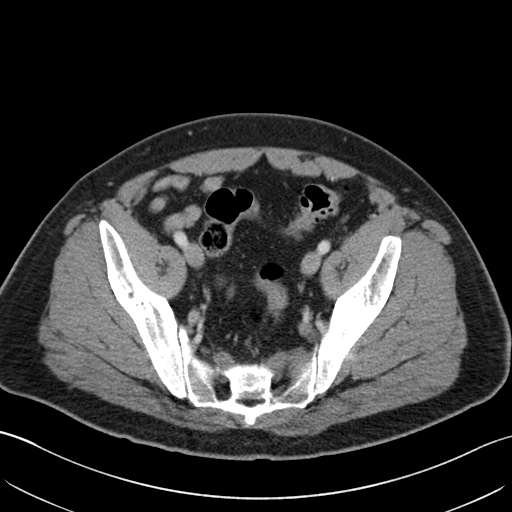
[im 32/103  soft-tissue]
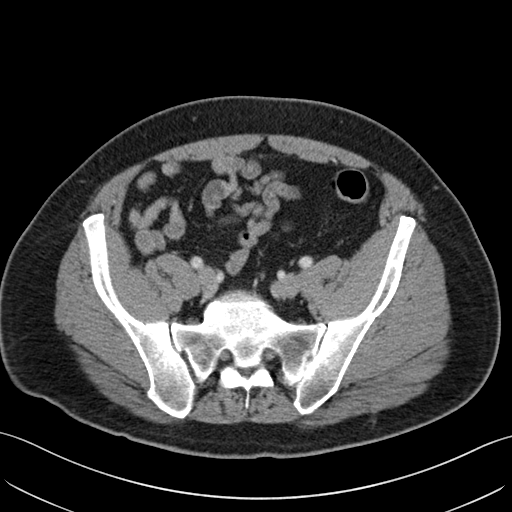
[im 39/103  soft-tissue]
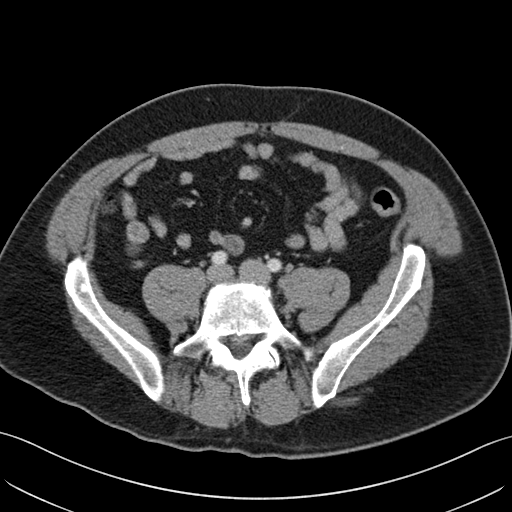
[im 45/103  soft-tissue]
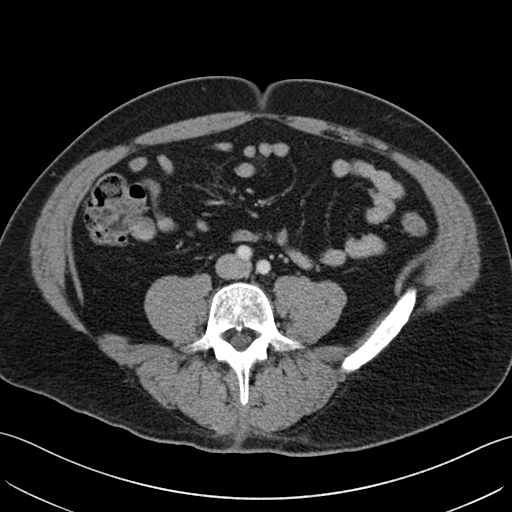
[im 58/103  soft-tissue]
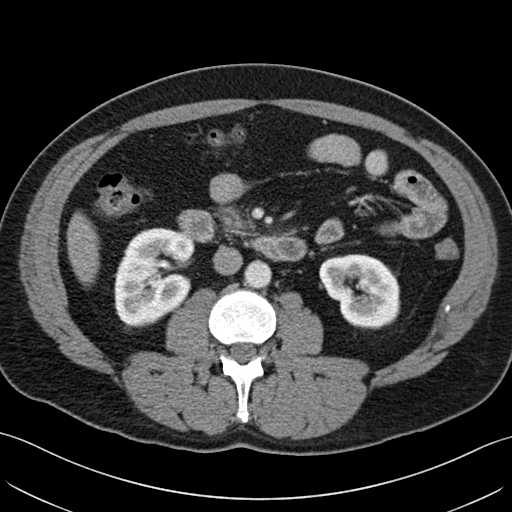
[im 64/103  soft-tissue]
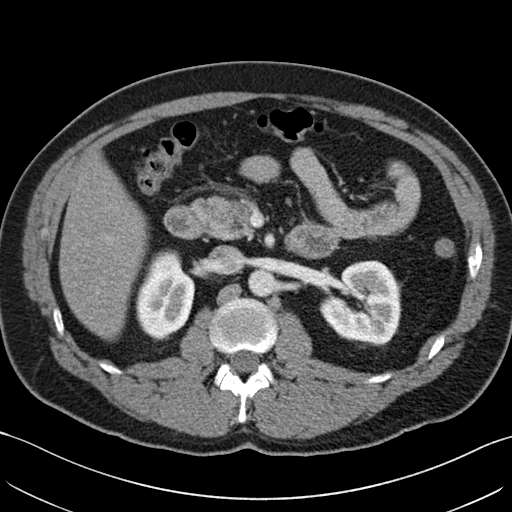
[im 71/103  soft-tissue]
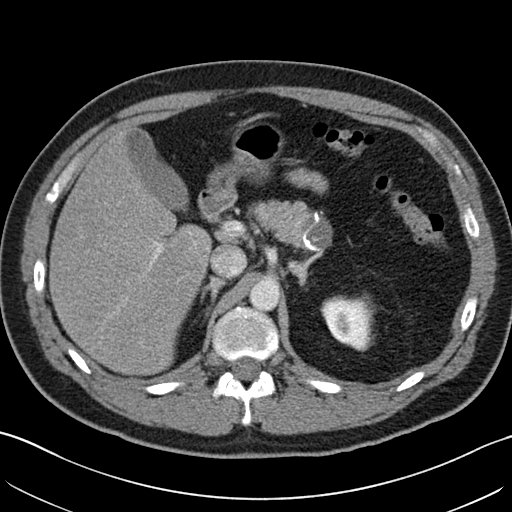
[im 71/103  bone]
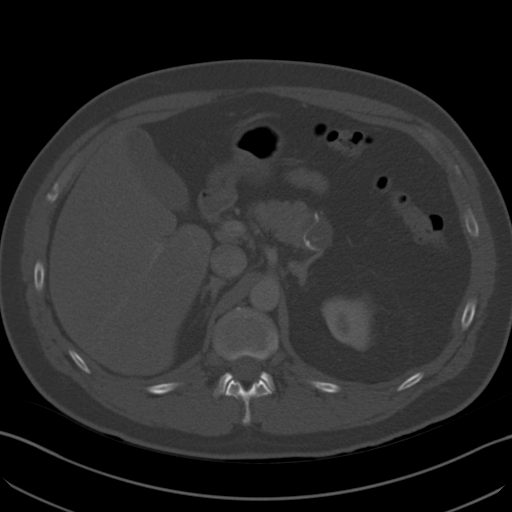
[im 77/103  soft-tissue]
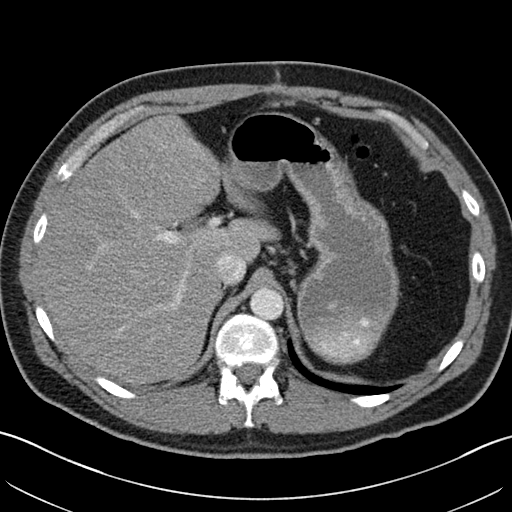
[im 90/103  soft-tissue]
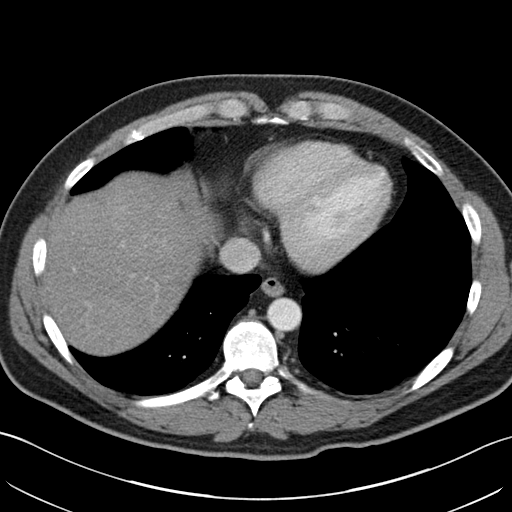
[im 96/103  soft-tissue]
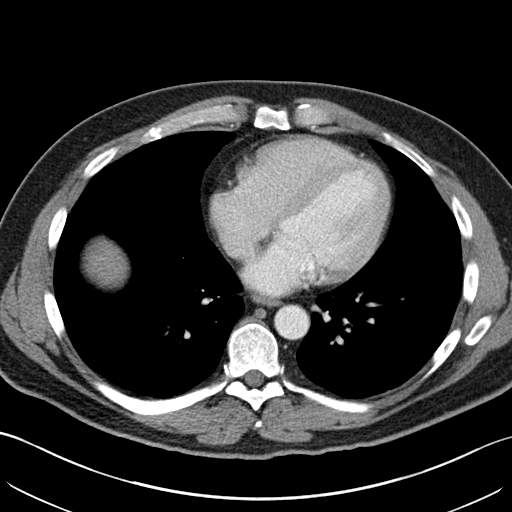

[Series 5: coronal st · coronal · 0.76mm/px · 3 of 146 slices shown]
[im 49/146  soft-tissue]
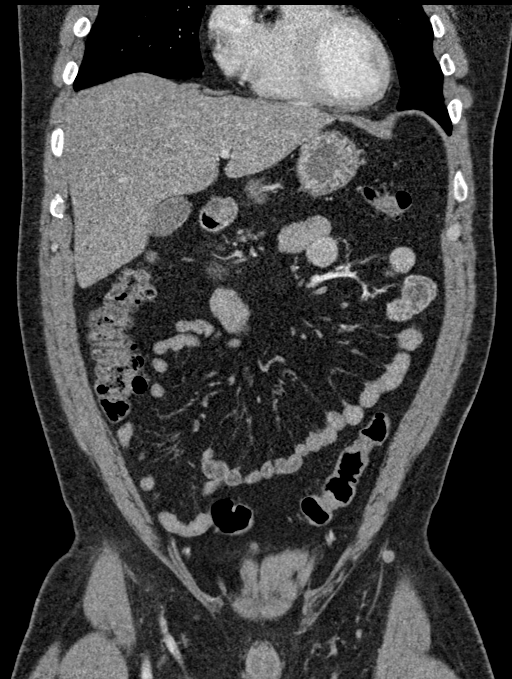
[im 65/146  soft-tissue]
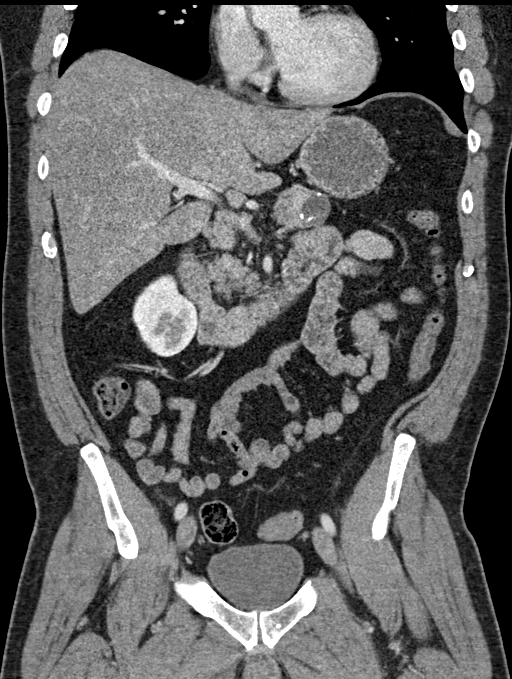
[im 81/146  soft-tissue]
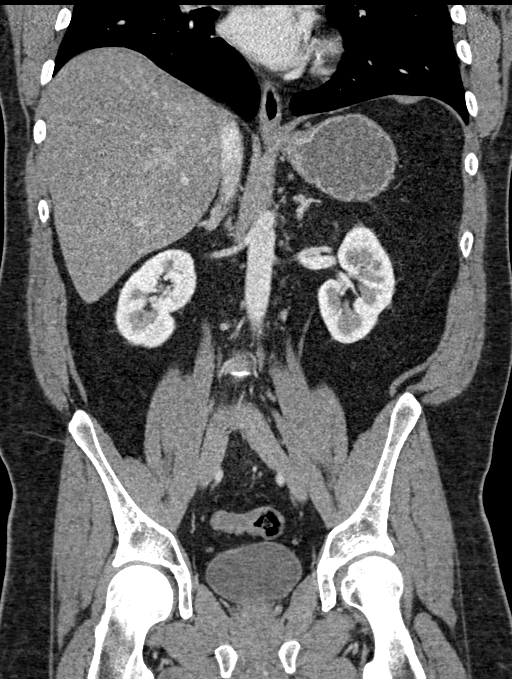

[15 of 46 positions shown; findings below may reference images not displayed]

FINDINGS: Lower chest: Mild dependent atelectasis is present at the lung
bases. No focal nodule, mass, or airspace disease is present. Heart
size is normal. No significant pleural or pericardial effusion is
present.

Hepatobiliary: There is diffuse fatty infiltration liver. No
discrete lesions are present. Common bile duct and gallbladder are
normal.

Pancreas: Distal pancreas is resected. There is a low-density lesion
at the resection site measuring 2.3 x 2.6 x 3.8 cm. Pancreatic head
and proximal body are normal. No inflammatory changes are
associated.

Spleen: Surgically absent

Adrenals/Urinary Tract: Adrenal glands are normal bilaterally.
Kidneys and ureters are within normal limits. The urinary bladder is
within normal limits.

Stomach/Bowel: The stomach and duodenum are within normal limits.
Small bowel is unremarkable. Terminal ileum is within normal limits.
The appendix is visualized and normal. The ascending and transverse
colon are normal. The descending and sigmoid colon are normal.

Vascular/Lymphatic: Atherosclerotic calcifications are again noted
in the distal aorta. There is no aneurysm.

Reproductive: Prostate is unremarkable.

Other: Vertebral body heights alignment are maintained. Mild
degenerative changes are noted at L5-S1. No focal lytic or blastic
lesions are present. Bony pelvis is within normal limits. Hips are
located and normal.

Musculoskeletal: Vertebral body heights alignment are maintained. No
focal lytic or blastic lesions are present. Degenerative changes are
noted at L5-S1. Bony pelvis is within normal limits. Hips are
located and normal.
IMPRESSION: 1. Low-density cystic lesion at the site of the pancreatic resection
measures 2.3 x 2.6 x 3.8 cm. No definite solid components are
present. This is nonspecific and may be postoperative in nature.
Recurrent tumor is not excluded. Elective MRI of the pancreas
without and with contrast could be used for further evaluation.
2. No acute abnormality.
3. Hepatic steatosis.
4.  Aortic Atherosclerosis ([id number]).

## 2020-09-28 DIAGNOSIS — M25561 Pain in right knee: Secondary | ICD-10-CM | POA: Diagnosis not present

## 2020-10-04 DIAGNOSIS — J209 Acute bronchitis, unspecified: Secondary | ICD-10-CM | POA: Diagnosis not present

## 2020-11-20 DIAGNOSIS — R062 Wheezing: Secondary | ICD-10-CM | POA: Diagnosis not present

## 2020-11-20 DIAGNOSIS — J3089 Other allergic rhinitis: Secondary | ICD-10-CM | POA: Diagnosis not present

## 2021-02-04 ENCOUNTER — Encounter (HOSPITAL_BASED_OUTPATIENT_CLINIC_OR_DEPARTMENT_OTHER): Payer: Self-pay

## 2021-02-04 ENCOUNTER — Inpatient Hospital Stay (HOSPITAL_BASED_OUTPATIENT_CLINIC_OR_DEPARTMENT_OTHER)
Admission: EM | Admit: 2021-02-04 | Discharge: 2021-02-12 | DRG: 440 | Disposition: A | Discharge status: Home / Self Care | Payer: Medicare HMO | Attending: Internal Medicine | Admitting: Internal Medicine

## 2021-02-04 ENCOUNTER — Other Ambulatory Visit: Payer: Self-pay

## 2021-02-04 ENCOUNTER — Emergency Department (HOSPITAL_BASED_OUTPATIENT_CLINIC_OR_DEPARTMENT_OTHER): Payer: Medicare HMO

## 2021-02-04 DIAGNOSIS — Y92239 Unspecified place in hospital as the place of occurrence of the external cause: Secondary | ICD-10-CM | POA: Diagnosis not present

## 2021-02-04 DIAGNOSIS — D75839 Thrombocytosis, unspecified: Secondary | ICD-10-CM | POA: Diagnosis not present

## 2021-02-04 DIAGNOSIS — K219 Gastro-esophageal reflux disease without esophagitis: Secondary | ICD-10-CM | POA: Diagnosis present

## 2021-02-04 DIAGNOSIS — K5900 Constipation, unspecified: Secondary | ICD-10-CM | POA: Diagnosis not present

## 2021-02-04 DIAGNOSIS — Z91048 Other nonmedicinal substance allergy status: Secondary | ICD-10-CM | POA: Diagnosis not present

## 2021-02-04 DIAGNOSIS — Z7952 Long term (current) use of systemic steroids: Secondary | ICD-10-CM | POA: Diagnosis not present

## 2021-02-04 DIAGNOSIS — Z90411 Acquired partial absence of pancreas: Secondary | ICD-10-CM | POA: Diagnosis not present

## 2021-02-04 DIAGNOSIS — I700 Atherosclerosis of aorta: Secondary | ICD-10-CM | POA: Diagnosis present

## 2021-02-04 DIAGNOSIS — R0602 Shortness of breath: Secondary | ICD-10-CM

## 2021-02-04 DIAGNOSIS — T433X5A Adverse effect of phenothiazine antipsychotics and neuroleptics, initial encounter: Secondary | ICD-10-CM | POA: Diagnosis not present

## 2021-02-04 DIAGNOSIS — E119 Type 2 diabetes mellitus without complications: Secondary | ICD-10-CM | POA: Diagnosis present

## 2021-02-04 DIAGNOSIS — Z79899 Other long term (current) drug therapy: Secondary | ICD-10-CM | POA: Diagnosis not present

## 2021-02-04 DIAGNOSIS — K760 Fatty (change of) liver, not elsewhere classified: Secondary | ICD-10-CM | POA: Diagnosis present

## 2021-02-04 DIAGNOSIS — F410 Panic disorder [episodic paroxysmal anxiety] without agoraphobia: Secondary | ICD-10-CM | POA: Diagnosis not present

## 2021-02-04 DIAGNOSIS — Z6830 Body mass index (BMI) 30.0-30.9, adult: Secondary | ICD-10-CM

## 2021-02-04 DIAGNOSIS — Z20822 Contact with and (suspected) exposure to covid-19: Secondary | ICD-10-CM | POA: Diagnosis not present

## 2021-02-04 DIAGNOSIS — E785 Hyperlipidemia, unspecified: Secondary | ICD-10-CM | POA: Diagnosis present

## 2021-02-04 DIAGNOSIS — Z87891 Personal history of nicotine dependence: Secondary | ICD-10-CM

## 2021-02-04 DIAGNOSIS — N281 Cyst of kidney, acquired: Secondary | ICD-10-CM | POA: Diagnosis not present

## 2021-02-04 DIAGNOSIS — D3A8 Other benign neuroendocrine tumors: Secondary | ICD-10-CM | POA: Diagnosis present

## 2021-02-04 DIAGNOSIS — K858 Other acute pancreatitis without necrosis or infection: Secondary | ICD-10-CM | POA: Diagnosis not present

## 2021-02-04 DIAGNOSIS — D72829 Elevated white blood cell count, unspecified: Secondary | ICD-10-CM | POA: Diagnosis not present

## 2021-02-04 DIAGNOSIS — Z9081 Acquired absence of spleen: Secondary | ICD-10-CM

## 2021-02-04 DIAGNOSIS — K861 Other chronic pancreatitis: Secondary | ICD-10-CM | POA: Diagnosis present

## 2021-02-04 DIAGNOSIS — R079 Chest pain, unspecified: Secondary | ICD-10-CM | POA: Diagnosis not present

## 2021-02-04 DIAGNOSIS — R109 Unspecified abdominal pain: Secondary | ICD-10-CM | POA: Diagnosis not present

## 2021-02-04 DIAGNOSIS — R7401 Elevation of levels of liver transaminase levels: Secondary | ICD-10-CM | POA: Diagnosis not present

## 2021-02-04 DIAGNOSIS — K859 Acute pancreatitis without necrosis or infection, unspecified: Principal | ICD-10-CM | POA: Diagnosis present

## 2021-02-04 DIAGNOSIS — E669 Obesity, unspecified: Secondary | ICD-10-CM | POA: Diagnosis not present

## 2021-02-04 DIAGNOSIS — Z86012 Personal history of benign carcinoid tumor: Secondary | ICD-10-CM | POA: Diagnosis not present

## 2021-02-04 DIAGNOSIS — R933 Abnormal findings on diagnostic imaging of other parts of digestive tract: Secondary | ICD-10-CM

## 2021-02-04 DIAGNOSIS — R7989 Other specified abnormal findings of blood chemistry: Secondary | ICD-10-CM | POA: Diagnosis not present

## 2021-02-04 LAB — CBC WITH DIFFERENTIAL/PLATELET
Abs Immature Granulocytes: 0.04 10*3/uL (ref 0.00–0.07)
Basophils Absolute: 0.1 10*3/uL (ref 0.0–0.1)
Basophils Relative: 0 %
Eosinophils Absolute: 0.3 10*3/uL (ref 0.0–0.5)
Eosinophils Relative: 2 %
HCT: 42.2 % (ref 39.0–52.0)
Hemoglobin: 14.8 g/dL (ref 13.0–17.0)
Immature Granulocytes: 0 %
Lymphocytes Relative: 22 %
Lymphs Abs: 3.6 10*3/uL (ref 0.7–4.0)
MCH: 32.2 pg (ref 26.0–34.0)
MCHC: 35.1 g/dL (ref 30.0–36.0)
MCV: 91.7 fL (ref 80.0–100.0)
Monocytes Absolute: 1.4 10*3/uL — ABNORMAL HIGH (ref 0.1–1.0)
Monocytes Relative: 9 %
Neutro Abs: 10.7 10*3/uL — ABNORMAL HIGH (ref 1.7–7.7)
Neutrophils Relative %: 67 %
Platelets: 467 10*3/uL — ABNORMAL HIGH (ref 150–400)
RBC: 4.6 MIL/uL (ref 4.22–5.81)
RDW: 13.3 % (ref 11.5–15.5)
WBC: 16.1 10*3/uL — ABNORMAL HIGH (ref 4.0–10.5)
nRBC: 0 % (ref 0.0–0.2)

## 2021-02-04 LAB — COMPREHENSIVE METABOLIC PANEL
ALT: 62 U/L — ABNORMAL HIGH (ref 0–44)
AST: 28 U/L (ref 15–41)
Albumin: 4.2 g/dL (ref 3.5–5.0)
Alkaline Phosphatase: 91 U/L (ref 38–126)
Anion gap: 9 (ref 5–15)
BUN: 12 mg/dL (ref 6–20)
CO2: 23 mmol/L (ref 22–32)
Calcium: 9.1 mg/dL (ref 8.9–10.3)
Chloride: 101 mmol/L (ref 98–111)
Creatinine, Ser: 0.95 mg/dL (ref 0.61–1.24)
GFR, Estimated: >60 mL/min (ref >60)
Glucose, Bld: 123 mg/dL — ABNORMAL HIGH (ref 70–99)
Potassium: 3.9 mmol/L (ref 3.5–5.1)
Sodium: 133 mmol/L — ABNORMAL LOW (ref 135–145)
Total Bilirubin: 0.7 mg/dL (ref 0.3–1.2)
Total Protein: 8.1 g/dL (ref 6.5–8.1)

## 2021-02-04 LAB — RESP PANEL BY RT-PCR (FLU A&B, COVID) ARPGX2
Influenza A by PCR: NEGATIVE
Influenza B by PCR: NEGATIVE
SARS Coronavirus 2 by RT PCR: NEGATIVE

## 2021-02-04 LAB — LIPASE, BLOOD: Lipase: 52 U/L — ABNORMAL HIGH (ref 11–51)

## 2021-02-04 MED ORDER — ONDANSETRON HCL 4 MG PO TABS
4.0000 mg | ORAL_TABLET | Freq: Four times a day (QID) | ORAL | Status: DC | PRN
  Administered 2021-02-11 – 2021-02-12 (×2): 4 mg via ORAL
  Filled 2021-02-04 (×3): qty 1

## 2021-02-04 MED ORDER — SODIUM CHLORIDE 0.9 % IV BOLUS
1000.0000 mL | Freq: Once | INTRAVENOUS | Status: AC
  Administered 2021-02-04: 1000 mL via INTRAVENOUS

## 2021-02-04 MED ORDER — HYDROMORPHONE HCL 1 MG/ML IJ SOLN
1.0000 mg | Freq: Once | INTRAMUSCULAR | Status: AC
  Administered 2021-02-04: 1 mg via INTRAVENOUS
  Filled 2021-02-04: qty 1

## 2021-02-04 MED ORDER — LACTATED RINGERS IV BOLUS
1000.0000 mL | Freq: Once | INTRAVENOUS | Status: AC
  Administered 2021-02-04: 1000 mL via INTRAVENOUS

## 2021-02-04 MED ORDER — ACETAMINOPHEN 325 MG PO TABS
650.0000 mg | ORAL_TABLET | Freq: Four times a day (QID) | ORAL | Status: DC | PRN
  Administered 2021-02-08 – 2021-02-12 (×6): 650 mg via ORAL
  Filled 2021-02-04 (×6): qty 2

## 2021-02-04 MED ORDER — LACTATED RINGERS IV SOLN: INTRAVENOUS | Status: DC

## 2021-02-04 MED ORDER — OXYCODONE-ACETAMINOPHEN 5-325 MG PO TABS
1.0000 | ORAL_TABLET | Freq: Once | ORAL | Status: AC
  Administered 2021-02-04: 1 via ORAL
  Filled 2021-02-04: qty 1

## 2021-02-04 MED ORDER — HYDROMORPHONE HCL 1 MG/ML IJ SOLN
1.0000 mg | INTRAMUSCULAR | Status: DC | PRN
  Administered 2021-02-04 – 2021-02-05 (×6): 1 mg via INTRAVENOUS
  Filled 2021-02-04 (×6): qty 1

## 2021-02-04 MED ORDER — ACETAMINOPHEN 650 MG RE SUPP: 650.0000 mg | Freq: Four times a day (QID) | RECTAL | Status: DC | PRN

## 2021-02-04 MED ORDER — ONDANSETRON HCL 4 MG/2ML IJ SOLN
4.0000 mg | Freq: Once | INTRAMUSCULAR | Status: AC
  Administered 2021-02-04: 4 mg via INTRAVENOUS
  Filled 2021-02-04: qty 2

## 2021-02-04 MED ORDER — ONDANSETRON HCL 4 MG/2ML IJ SOLN
4.0000 mg | Freq: Four times a day (QID) | INTRAMUSCULAR | Status: DC | PRN
  Administered 2021-02-05 – 2021-02-12 (×15): 4 mg via INTRAVENOUS
  Filled 2021-02-04 (×16): qty 2

## 2021-02-04 MED ORDER — IOHEXOL 300 MG/ML  SOLN
100.0000 mL | Freq: Once | INTRAMUSCULAR | Status: AC | PRN
  Administered 2021-02-04: 100 mL via INTRAVENOUS

## 2021-02-04 MED ORDER — ENOXAPARIN SODIUM 40 MG/0.4ML IJ SOSY
40.0000 mg | PREFILLED_SYRINGE | INTRAMUSCULAR | Status: DC
  Administered 2021-02-05 – 2021-02-12 (×8): 40 mg via SUBCUTANEOUS
  Filled 2021-02-04 (×8): qty 0.4

## 2021-02-04 NOTE — ED Notes (Signed)
Patient transported to CT 

## 2021-02-04 NOTE — ED Triage Notes (Signed)
Pt with hx pancreatitis, reports abdominal pain x1 week, no po foods, increasing po fluids.  Denies n/v

## 2021-02-04 NOTE — H&P (Signed)
History and Physical    Andre Simmons KNL:[id number] DOB: Apr 04, 1975 DOA: 02/04/2021  PCP: Maryella Shivers, MD  Patient coming from: Home  I have personally briefly reviewed patient's old medical records in [place]  Chief Complaint: Abd pain  HPI: Andre Simmons is a 45 y.o. male with medical history significant of neuroendocrine pancreatic tumor s/p resection of tail of pancreas, splenectomy in 2018.  Recurrent pancreatitis flares since that time.  Pt presents to ED with c/o 1 week abd pain.  Pain in epigastric region, similar to prior pancreatitis.  2 episodes of vomiting this AM and has decreased PO intake.  No fever, chills.   ED Course: Lipase 52.  CT shows mild acute pancreatitis.  Pain returns despite PO pain meds.   Review of Systems: As per HPI, otherwise all review of systems negative.  Past Medical History:  Diagnosis Date   Arthritis    Chronic back pain    Depression    GERD (gastroesophageal reflux disease)    Knee pain    Pancreatic tumor     Past Surgical History:  Procedure Laterality Date   BACK SURGERY  03/2018   Dr Trenton Gammon.  L4-L5, S1 micro discectomy   EUS N/A 09/25/2017   Procedure: UPPER ENDOSCOPIC ULTRASOUND (EUS) LINEAR;  Surgeon: Milus Banister, MD;  Location: [facility];  Service: Endoscopy;  Laterality: N/A;  Radial and Linear   FINE NEEDLE ASPIRATION  09/25/2017   Procedure: FINE NEEDLE ASPIRATION (FNA) RADIAL;  Surgeon: Milus Banister, MD;  Location: [facility];  Service: Endoscopy;;   KNEE SURGERY Left    x 20 surgeries   LAPAROSCOPIC DISTAL PANCREATECTOMY  11/26/2017   Diagnostic laparoscopy, laparoscopic hand-assisted distal    MOUTH SURGERY     as a child, unsure if wisdom teeth or tonsils   PANCREATECTOMY N/A 11/25/2017   Procedure: LAPAROSCOPIC DISTAL PANCREATECTOMY AND SPLENECTOMY ERAS PATHWAY;  Surgeon: Stark Klein, MD;  Location: [facility];  Service: General;  Laterality: N/A;   Removal of Spleen  2019      reports that he has quit smoking. He has never used smokeless tobacco. He reports current drug use. Drug: Psilocybin. He reports that he does not drink alcohol.  Allergies  Allergen Reactions   Adhesive [Tape] Rash and Other (See Comments)    Skin irritation.    Family History  Problem Relation Age of Onset   Thyroid cancer Mother    Other Father        MVA at age 56   Esophageal cancer Maternal Grandmother        smoker   Breast cancer Maternal Grandmother    Esophageal cancer Paternal Grandmother    Colon cancer Neg Hx    Rectal cancer Neg Hx      Prior to Admission medications   Medication Sig Start Date End Date Taking? Authorizing Provider  diazepam (VALIUM) 5 MG tablet Take 1-2 tablets (5-10 mg total) by mouth every 6 (six) hours as needed for muscle spasms. 12/09/18   Earnie Larsson, MD  gabapentin (NEURONTIN) 400 MG capsule Take 1 capsule (400 mg total) by mouth 2 (two) times daily. Patient not taking: Reported on 12/01/2018 06/30/18   Cristal Ford, DO  HYDROmorphone (DILAUDID) 2 MG tablet Take 1 tablet (2 mg total) by mouth every 4 (four) hours as needed for severe pain. 12/09/18   Earnie Larsson, MD  omeprazole (PRILOSEC) 40 MG capsule Take 40 mg by mouth 2 (two) times daily.  [provider]  oxyCODONE-acetaminophen (PERCOCET/ROXICET) 5-325 MG tablet Take 2 tablets by mouth every 4 (four) hours as needed for severe pain. Patient taking differently: Take 1 tablet by mouth every 6 (six) hours as needed for severe pain (pain).  03/15/18   Ward, Ozella Almond, PA-C  predniSONE (STERAPRED UNI-PAK 21 TAB) 10 MG (21) TBPK tablet Take by mouth daily. Take 6 tabs by mouth daily  for 1 day, then 5 tabs for 1 day, then 4 tabs for 1 day, then 3 tabs for 1 day, 2 tabs for 1 day, then 1 tab by mouth daily for 1 day 12/18/18   Barrie Folk, PA-C    Physical Exam: Vitals:   02/04/21 1800 02/04/21 2150 02/04/21 2155 02/04/21 2242  BP: 116/76 (!) 123/94  (!)  150/88  Pulse: (!) 55 80 71 72  Resp: 18 16  20   Temp:  98.4 F (36.9 C)  97.7 F (36.5 C)  TempSrc:  Oral  Oral  SpO2: 93% 95% 91% 97%  Weight:      Height:        Constitutional: NAD, calm, comfortable Eyes: PERRL, lids and conjunctivae normal ENMT: Mucous membranes are moist. Posterior pharynx clear of any exudate or lesions.Normal dentition.  Neck: normal, supple, no masses, no thyromegaly Respiratory: clear to auscultation bilaterally, no wheezing, no crackles. Normal respiratory effort. No accessory muscle use.  Cardiovascular: Regular rate and rhythm, no murmurs / rubs / gallops. No extremity edema. 2+ pedal pulses. No carotid bruits.  Abdomen: epigastric TTP Musculoskeletal: no clubbing / cyanosis. No joint deformity upper and lower extremities. Good ROM, no contractures. Normal muscle tone.  Skin: no rashes, lesions, ulcers. No induration Neurologic: CN 2-12 grossly intact. Sensation intact, DTR normal. Strength 5/5 in all 4.  Psychiatric: Normal judgment and insight. Alert and oriented x 3. Normal mood.    Labs on Admission: I have personally reviewed following labs and imaging studies  CBC: Recent Labs  Lab 02/04/21 1503  WBC 16.1*  NEUTROABS 10.7*  HGB 14.8  HCT 42.2  MCV 91.7  PLT 373*   Basic Metabolic Panel: Recent Labs  Lab 02/04/21 1503  NA 133*  K 3.9  CL 101  CO2 23  GLUCOSE 123*  BUN 12  CREATININE 0.95  CALCIUM 9.1   GFR: Estimated Creatinine Clearance: 133.5 mL/min (by C-G formula based on SCr of 0.95 mg/dL). Liver Function Tests: Recent Labs  Lab 02/04/21 1503  AST 28  ALT 62*  ALKPHOS 91  BILITOT 0.7  PROT 8.1  ALBUMIN 4.2   Recent Labs  Lab 02/04/21 1503  LIPASE 52*   No results for input(s): AMMONIA in the last 168 hours. Coagulation Profile: No results for input(s): INR, PROTIME in the last 168 hours. Cardiac Enzymes: No results for input(s): CKTOTAL, CKMB, CKMBINDEX, TROPONINI in the last 168 hours. BNP (last 3  results) No results for input(s): PROBNP in the last 8760 hours. HbA1C: No results for input(s): HGBA1C in the last 72 hours. CBG: No results for input(s): GLUCAP in the last 168 hours. Lipid Profile: No results for input(s): CHOL, HDL, LDLCALC, TRIG, CHOLHDL, LDLDIRECT in the last 72 hours. Thyroid Function Tests: No results for input(s): TSH, T4TOTAL, FREET4, T3FREE, THYROIDAB in the last 72 hours. Anemia Panel: No results for input(s): VITAMINB12, FOLATE, FERRITIN, TIBC, IRON, RETICCTPCT in the last 72 hours. Urine analysis:    Component Value Date/Time   COLORURINE YELLOW 11/21/2017 [place] 11/21/2017 0835   LABSPEC 1.026  11/21/2017 0835   PHURINE 5.0 11/21/2017 0835   GLUCOSEU NEGATIVE 11/21/2017 0835   HGBUR NEGATIVE 11/21/2017 0835   BILIRUBINUR NEGATIVE 11/21/2017 0835   KETONESUR NEGATIVE 11/21/2017 0835   PROTEINUR NEGATIVE 11/21/2017 0835   UROBILINOGEN 0.2 09/19/2009 1400   NITRITE NEGATIVE 11/21/2017 0835   LEUKOCYTESUR NEGATIVE 11/21/2017 0835    Radiological Exams on Admission: CT ABDOMEN PELVIS W CONTRAST  Result Date: 02/04/2021 CLINICAL DATA:  Pancreatitis, abdominal pain for 1 week EXAM: CT ABDOMEN AND PELVIS WITH CONTRAST TECHNIQUE: Multidetector CT imaging of the abdomen and pelvis was performed using the standard protocol following bolus administration of intravenous contrast. CONTRAST:  162mL OMNIPAQUE IOHEXOL 300 MG/ML  SOLN COMPARISON:  06/25/2018, 10/10/2019 FINDINGS: Lower chest: No acute pleural or parenchymal lung disease. Hepatobiliary: Diffuse hepatic steatosis, with persistent focal fatty sparing near the gallbladder fossa. No biliary duct dilation. The gallbladder is unremarkable. Pancreas: Postsurgical changes from distal pancreatectomy. There is mild edema and peripancreatic fat stranding within the pancreatic head, consistent with acute uncomplicated pancreatitis. No fluid collection, pseudocyst, or abscess. Spleen: Surgically  absent. Adrenals/Urinary Tract: Adrenal glands are unremarkable. Kidneys are normal, without renal calculi, focal lesion, or hydronephrosis. Bladder is unremarkable. Stomach/Bowel: No bowel obstruction or ileus. Normal appendix right lower quadrant. No bowel wall thickening or inflammatory change. Vascular/Lymphatic: Aortic atherosclerosis. No enlarged abdominal or pelvic lymph nodes. Reproductive: Prostate is unremarkable. Other: No free fluid or free gas.  No abdominal wall hernia. Musculoskeletal: No acute or destructive bony lesions. Reconstructed images demonstrate no additional findings. IMPRESSION: 1. Mild acute uncomplicated pancreatitis involving the pancreatic head. No fluid collection, pseudocyst, or abscess. 2. Postsurgical changes from distal pancreatectomy and splenectomy. 3. Hepatic steatosis, with areas of focal fatty sparing near the gallbladder fossa. 4.  Aortic Atherosclerosis (ICD10-I70.0). Electronically Signed   By: Randa Ngo M.D.   On: 02/04/2021 16:26    EKG: Independently reviewed.  Assessment/Plan Principal Problem:   Acute pancreatitis Active Problems:   Neuroendocrine tumor of pancreas    Acute pancreatitis - Mild acute on chronic pancreatitis Looks like hes been having flare ups occasionally ever since resection of tail of pancreas and spleen for neuroendocrine tumor in 2018. NPO IVF Pain control with IV narcotics Repeat labs in AM H/o neuroendocrine tumor of tail of pancreas - Resection in 2018 MRI in 2020 showed fluid collection at resection margin that they thought was just post-op findings, didn't think it represented recurrence at that time.  Did recommend repeat MRI in 3 months which wasn't performed May wish to consider repeat MRI; though I would have expected recurrent pancreatic cancer to have declared itself more clearly on ct imaging studies at this late a date (2 years later).  DVT prophylaxis: Lovenox Code Status: Full Family Communication: No  family in room Disposition Plan: Home after pain controlled Consults called: None Admission status: Place in obs    Shamera Yarberry, [facility]  How to contact the [facility] Attending or Consulting provider [place] or covering provider during after hours [place], for this patient?  Check the care team in [facility] and look for a) attending/consulting [facility] provider listed and b) the [facility] team listed Log into [email or web address]  Amion Physician Scheduling and messaging for groups and whole hospitals  On call and physician scheduling software for group practices, residents, hospitalists and other medical providers for call, clinic, rotation and shift schedules. OnCall Enterprise is a hospital-wide system for scheduling doctors and paging doctors on call. EasyPlot is for scientific plotting  and data analysis.  [email or web address]  and use [facility]'s universal password to access. If you do not have the password, please contact the hospital operator.  Locate the [facility] provider you are looking for under [facility] and page to a number that you can be directly reached. If you still have difficulty reaching the provider, please page the [facility] (Director on Call) for the Hospitalists listed on amion for assistance.  02/04/2021, 10:43 PM

## 2021-02-04 NOTE — ED Notes (Signed)
ED Provider at bedside. 

## 2021-02-04 NOTE — ED Provider Notes (Signed)
[facility] EMERGENCY DEPARTMENT Provider Note   CSN: [id number] Arrival date & time: 02/04/21  1402     History Chief Complaint  Patient presents with   Abdominal Pain    Andre Simmons is a 45 y.o. male with past medical history of neuroendocrine pancreatic tumor, back pain, GERD.  Patient has surgical history of splenectomy and distal pancreatic removal by Dr. Barry Dienes in 2018.  Patient states that he has not followed up with Dr. Barry Dienes or seen her for ongoing management in the last year. Patient presents to ED due to epigastric abdominal pain since last Tuesday.   Patient states that the pain is progressively worsened, describes it as a dull ache that is constant without radiation.  Patient states lying on his left side aggravates his pain and nothing relieves this pain.  Patient endorses nausea, vomiting, lack of appetite, diarrhea.  Patient denies fever, chills, alcohol use, tobacco use.   Abdominal Pain Associated symptoms: diarrhea, nausea and vomiting   Associated symptoms: no chest pain, no chills, no dysuria, no fever and no shortness of breath       Past Medical History:  Diagnosis Date   Arthritis    Chronic back pain    Depression    GERD (gastroesophageal reflux disease)    Knee pain    Pancreatic tumor     Patient Active Problem List   Diagnosis Date Noted   Recurrent herniation of lumbar disc 12/08/2018   Abdominal pain 06/25/2018   Neuroendocrine tumor of pancreas 11/25/2017   Acute pancreatitis 09/26/2017   Chronic back pain    Nausea and vomiting in adult patient    Abnormal MRI 09/18/2017   Pancreatic lesion 09/18/2017   S/P ACL reconstruction 04/14/2014   Sprain of medial collateral ligament of left knee 03/03/2014   Primary osteoarthritis of left knee 11/18/2013   Complete tear of anterior cruciate ligament of left knee 11/18/2013    Past Surgical History:  Procedure Laterality Date   BACK SURGERY  03/2018   Dr Trenton Gammon.  L4-L5, S1  micro discectomy   EUS N/A 09/25/2017   Procedure: UPPER ENDOSCOPIC ULTRASOUND (EUS) LINEAR;  Surgeon: Milus Banister, MD;  Location: [facility];  Service: Endoscopy;  Laterality: N/A;  Radial and Linear   FINE NEEDLE ASPIRATION  09/25/2017   Procedure: FINE NEEDLE ASPIRATION (FNA) RADIAL;  Surgeon: Milus Banister, MD;  Location: [facility];  Service: Endoscopy;;   KNEE SURGERY Left    x 20 surgeries   LAPAROSCOPIC DISTAL PANCREATECTOMY  11/26/2017   Diagnostic laparoscopy, laparoscopic hand-assisted distal    MOUTH SURGERY     as a child, unsure if wisdom teeth or tonsils   PANCREATECTOMY N/A 11/25/2017   Procedure: LAPAROSCOPIC DISTAL PANCREATECTOMY AND SPLENECTOMY ERAS PATHWAY;  Surgeon: Stark Klein, MD;  Location: [facility];  Service: General;  Laterality: N/A;   Removal of Spleen  2019       Family History  Problem Relation Age of Onset   Thyroid cancer Mother    Other Father        MVA at age 37   Esophageal cancer Maternal Grandmother        smoker   Breast cancer Maternal Grandmother    Esophageal cancer Paternal Grandmother    Colon cancer Neg Hx    Rectal cancer Neg Hx     Social History   Tobacco Use   Smoking status: Former   Smokeless tobacco: Never   Tobacco comments:  Quit 14-15 years ago (as of 2019)  Vaping Use   Vaping Use: Never used  Substance Use Topics   Alcohol use: No   Drug use: Yes    Types: Psilocybin    Home Medications Prior to Admission medications   Medication Sig Start Date End Date Taking? Authorizing Provider  diazepam (VALIUM) 5 MG tablet Take 1-2 tablets (5-10 mg total) by mouth every 6 (six) hours as needed for muscle spasms. 12/09/18   Earnie Larsson, MD  gabapentin (NEURONTIN) 400 MG capsule Take 1 capsule (400 mg total) by mouth 2 (two) times daily. Patient not taking: Reported on 12/01/2018 06/30/18   Cristal Ford, DO  HYDROmorphone (DILAUDID) 2 MG tablet Take 1 tablet (2 mg total) by mouth every 4 (four) hours as needed  for severe pain. 12/09/18   Earnie Larsson, MD  omeprazole (PRILOSEC) 40 MG capsule Take 40 mg by mouth 2 (two) times daily.     [provider]  oxyCODONE-acetaminophen (PERCOCET/ROXICET) 5-325 MG tablet Take 2 tablets by mouth every 4 (four) hours as needed for severe pain. Patient taking differently: Take 1 tablet by mouth every 6 (six) hours as needed for severe pain (pain).  03/15/18   Ward, Ozella Almond, PA-C  predniSONE (STERAPRED UNI-PAK 21 TAB) 10 MG (21) TBPK tablet Take by mouth daily. Take 6 tabs by mouth daily  for 1 day, then 5 tabs for 1 day, then 4 tabs for 1 day, then 3 tabs for 1 day, 2 tabs for 1 day, then 1 tab by mouth daily for 1 day 12/18/18   Sherol Dade E, PA-C    Allergies    Adhesive [tape]  Review of Systems   Review of Systems  Constitutional:  Negative for chills and fever.  Respiratory:  Negative for shortness of breath.   Cardiovascular:  Negative for chest pain.  Gastrointestinal:  Positive for abdominal pain, diarrhea, nausea and vomiting. Negative for blood in stool.  Genitourinary:  Negative for difficulty urinating and dysuria.  Musculoskeletal:  Negative for back pain.  Skin:  Negative for color change.  All other systems reviewed and are negative.  Physical Exam Updated Vital Signs BP 116/76   Pulse (!) 55   Temp 98 F (36.7 C) (Oral)   Resp 18   Ht 6\' 3"  (1.905 m)   Wt 111.1 kg   SpO2 93%   BMI 30.62 kg/m   Physical Exam Vitals and nursing note reviewed.  Constitutional:      General: He is not in acute distress.    Appearance: He is ill-appearing. He is not toxic-appearing.  HENT:     Head: Normocephalic.     Mouth/Throat:     Mouth: Mucous membranes are moist.     Pharynx: Oropharynx is clear.  Eyes:     Extraocular Movements: Extraocular movements intact.  Cardiovascular:     Rate and Rhythm: Normal rate and regular rhythm.  Pulmonary:     Effort: Pulmonary effort is normal.     Breath sounds: Normal breath  sounds. No wheezing.  Abdominal:     General: Abdomen is flat. Bowel sounds are normal.     Palpations: Abdomen is soft.     Tenderness: There is abdominal tenderness in the epigastric area.  Skin:    General: Skin is warm and dry.     Capillary Refill: Capillary refill takes less than 2 seconds.  Neurological:     General: No focal deficit present.     Mental Status: He  is alert.    ED Results / Procedures / Treatments   Labs (all labs ordered are listed, but only abnormal results are displayed) Labs Reviewed  CBC WITH DIFFERENTIAL/PLATELET - Abnormal; Notable for the following components:      Result Value   WBC 16.1 (*)    Platelets 467 (*)    Neutro Abs 10.7 (*)    Monocytes Absolute 1.4 (*)    All other components within normal limits  COMPREHENSIVE METABOLIC PANEL - Abnormal; Notable for the following components:   Sodium 133 (*)    Glucose, Bld 123 (*)    ALT 62 (*)    All other components within normal limits  LIPASE, BLOOD - Abnormal; Notable for the following components:   Lipase 52 (*)    All other components within normal limits  RESP PANEL BY RT-PCR (FLU A&B, COVID) [id number]    EKG None  Radiology CT ABDOMEN PELVIS W CONTRAST  Result Date: 02/04/2021 CLINICAL DATA:  Pancreatitis, abdominal pain for 1 week EXAM: CT ABDOMEN AND PELVIS WITH CONTRAST TECHNIQUE: Multidetector CT imaging of the abdomen and pelvis was performed using the standard protocol following bolus administration of intravenous contrast. CONTRAST:  191mL OMNIPAQUE IOHEXOL 300 MG/ML  SOLN COMPARISON:  06/25/2018, 10/10/2019 FINDINGS: Lower chest: No acute pleural or parenchymal lung disease. Hepatobiliary: Diffuse hepatic steatosis, with persistent focal fatty sparing near the gallbladder fossa. No biliary duct dilation. The gallbladder is unremarkable. Pancreas: Postsurgical changes from distal pancreatectomy. There is mild edema and peripancreatic fat stranding within the pancreatic head,  consistent with acute uncomplicated pancreatitis. No fluid collection, pseudocyst, or abscess. Spleen: Surgically absent. Adrenals/Urinary Tract: Adrenal glands are unremarkable. Kidneys are normal, without renal calculi, focal lesion, or hydronephrosis. Bladder is unremarkable. Stomach/Bowel: No bowel obstruction or ileus. Normal appendix right lower quadrant. No bowel wall thickening or inflammatory change. Vascular/Lymphatic: Aortic atherosclerosis. No enlarged abdominal or pelvic lymph nodes. Reproductive: Prostate is unremarkable. Other: No free fluid or free gas.  No abdominal wall hernia. Musculoskeletal: No acute or destructive bony lesions. Reconstructed images demonstrate no additional findings. IMPRESSION: 1. Mild acute uncomplicated pancreatitis involving the pancreatic head. No fluid collection, pseudocyst, or abscess. 2. Postsurgical changes from distal pancreatectomy and splenectomy. 3. Hepatic steatosis, with areas of focal fatty sparing near the gallbladder fossa. 4.  Aortic Atherosclerosis (ICD10-I70.0). Electronically Signed   By: Randa Ngo M.D.   On: 02/04/2021 16:26    Procedures Procedures   Medications Ordered in ED Medications  HYDROmorphone (DILAUDID) injection 1 mg (has no administration in time range)  sodium chloride 0.9 % bolus 1,000 mL (0 mLs Intravenous Stopped 02/04/21 1739)  ondansetron (ZOFRAN) injection 4 mg (4 mg Intravenous Given 02/04/21 1505)  HYDROmorphone (DILAUDID) injection 1 mg (1 mg Intravenous Given 02/04/21 1549)  iohexol (OMNIPAQUE) 300 MG/ML solution 100 mL (100 mLs Intravenous Contrast Given 02/04/21 1604)  sodium chloride 0.9 % bolus 1,000 mL (0 mLs Intravenous Stopped 02/04/21 1843)  oxyCODONE-acetaminophen (PERCOCET/ROXICET) 5-325 MG per tablet 1 tablet (1 tablet Oral Given 02/04/21 1739)    ED Course  I have reviewed the triage vital signs and the nursing notes.  Pertinent labs & imaging results that were available during my care of the  patient were reviewed by me and considered in my medical decision making (see chart for details).    MDM Rules/Calculators/A&P                          Male with history of  neuroendocrine pancreatic tumor, splenectomy, distal pancreatic removal in 2018 presents with 1 week of abdominal pain.  Pain is located in the epigastric region and patient denies radiation.  Patient states that he has not been eating normally as in the past this is relieved his abdominal pain.  Patient reports increased p.o. intake over the last week.  On exam, patient is afebrile, nontoxic-appearing.  Patient reports 2 episodes of vomiting this morning but no further.  I will CT scan this patient's abdomen to assess for any pancreatic cysts. I would treat this patient's pain with 1 mg Dilaudid. I will start this patient on 1 L of normal saline for fluid replenishment. Labs will be assessed include CBC, CMP, lipase.  Patient's lipase result results of 52. Patient CBC notable for elevated white blood cell count of 16.1. Patient CMP notable result of decreased sodium to 133.  Patient CT scan results with result of acute uncomplicated pancreatitis.  No fluid collection, cyst, abscess.  We will treat this patient's pain and give him IV fluids and assess his response.  On reassessment patient reports improved pain. We will hang another liter of fluid for patient and trial PO percocet for pain control. If patient able to tolerate PO pain control, plan for discharge with outpatient follow up.   On further reassessment, patient states pain has returned with Percocet.  I discussed with this patient the need to admit him for pain management and further observation.  Patient stated his agreement with this plan.  I will consult the hospitalist for admission.  Spoke with Dr. Hal Hope who instructed me to give this patient a lactated Ringer bolus.  Dr. Hal Hope agreed to admit patient.  Patient is stable at this  time.    Final Clinical Impression(s) / ED Diagnoses Final diagnoses:  Chronic pancreatitis, unspecified pancreatitis type [facility])    Rx / DC Orders ED Discharge Orders     None        Lawana Chambers 02/04/21 1913    Margette Fast, MD 02/08/21 1541

## 2021-02-05 DIAGNOSIS — F410 Panic disorder [episodic paroxysmal anxiety] without agoraphobia: Secondary | ICD-10-CM | POA: Diagnosis not present

## 2021-02-05 DIAGNOSIS — Z87891 Personal history of nicotine dependence: Secondary | ICD-10-CM | POA: Diagnosis not present

## 2021-02-05 DIAGNOSIS — K219 Gastro-esophageal reflux disease without esophagitis: Secondary | ICD-10-CM | POA: Diagnosis present

## 2021-02-05 DIAGNOSIS — Z79899 Other long term (current) drug therapy: Secondary | ICD-10-CM | POA: Diagnosis not present

## 2021-02-05 DIAGNOSIS — K922 Gastrointestinal hemorrhage, unspecified: Secondary | ICD-10-CM | POA: Insufficient documentation

## 2021-02-05 DIAGNOSIS — D75839 Thrombocytosis, unspecified: Secondary | ICD-10-CM | POA: Diagnosis present

## 2021-02-05 DIAGNOSIS — Z20822 Contact with and (suspected) exposure to covid-19: Secondary | ICD-10-CM | POA: Diagnosis present

## 2021-02-05 DIAGNOSIS — Y92239 Unspecified place in hospital as the place of occurrence of the external cause: Secondary | ICD-10-CM | POA: Diagnosis not present

## 2021-02-05 DIAGNOSIS — Z6830 Body mass index (BMI) 30.0-30.9, adult: Secondary | ICD-10-CM | POA: Diagnosis not present

## 2021-02-05 DIAGNOSIS — D3A8 Other benign neuroendocrine tumors: Secondary | ICD-10-CM | POA: Diagnosis present

## 2021-02-05 DIAGNOSIS — R7401 Elevation of levels of liver transaminase levels: Secondary | ICD-10-CM | POA: Diagnosis not present

## 2021-02-05 DIAGNOSIS — Z7952 Long term (current) use of systemic steroids: Secondary | ICD-10-CM | POA: Diagnosis not present

## 2021-02-05 DIAGNOSIS — K858 Other acute pancreatitis without necrosis or infection: Secondary | ICD-10-CM | POA: Diagnosis not present

## 2021-02-05 DIAGNOSIS — T433X5A Adverse effect of phenothiazine antipsychotics and neuroleptics, initial encounter: Secondary | ICD-10-CM | POA: Diagnosis not present

## 2021-02-05 DIAGNOSIS — Z90411 Acquired partial absence of pancreas: Secondary | ICD-10-CM | POA: Diagnosis not present

## 2021-02-05 DIAGNOSIS — K5900 Constipation, unspecified: Secondary | ICD-10-CM | POA: Diagnosis present

## 2021-02-05 DIAGNOSIS — Z91048 Other nonmedicinal substance allergy status: Secondary | ICD-10-CM | POA: Diagnosis not present

## 2021-02-05 DIAGNOSIS — Z9081 Acquired absence of spleen: Secondary | ICD-10-CM | POA: Diagnosis not present

## 2021-02-05 DIAGNOSIS — K859 Acute pancreatitis without necrosis or infection, unspecified: Secondary | ICD-10-CM | POA: Diagnosis present

## 2021-02-05 DIAGNOSIS — I700 Atherosclerosis of aorta: Secondary | ICD-10-CM | POA: Diagnosis present

## 2021-02-05 DIAGNOSIS — D72829 Elevated white blood cell count, unspecified: Secondary | ICD-10-CM | POA: Diagnosis not present

## 2021-02-05 DIAGNOSIS — E785 Hyperlipidemia, unspecified: Secondary | ICD-10-CM | POA: Diagnosis present

## 2021-02-05 DIAGNOSIS — K861 Other chronic pancreatitis: Secondary | ICD-10-CM | POA: Diagnosis present

## 2021-02-05 DIAGNOSIS — K760 Fatty (change of) liver, not elsewhere classified: Secondary | ICD-10-CM | POA: Diagnosis present

## 2021-02-05 DIAGNOSIS — E669 Obesity, unspecified: Secondary | ICD-10-CM | POA: Diagnosis present

## 2021-02-05 DIAGNOSIS — E119 Type 2 diabetes mellitus without complications: Secondary | ICD-10-CM | POA: Diagnosis present

## 2021-02-05 LAB — CBC
HCT: 42 % (ref 39.0–52.0)
Hemoglobin: 13.9 g/dL (ref 13.0–17.0)
MCH: 32 pg (ref 26.0–34.0)
MCHC: 33.1 g/dL (ref 30.0–36.0)
MCV: 96.8 fL (ref 80.0–100.0)
Platelets: 469 10*3/uL — ABNORMAL HIGH (ref 150–400)
RBC: 4.34 MIL/uL (ref 4.22–5.81)
RDW: 13.6 % (ref 11.5–15.5)
WBC: 14.2 10*3/uL — ABNORMAL HIGH (ref 4.0–10.5)
nRBC: 0 % (ref 0.0–0.2)

## 2021-02-05 LAB — LIPID PANEL
Cholesterol: 161 mg/dL (ref 0–200)
HDL: 34 mg/dL — ABNORMAL LOW (ref >40)
LDL Cholesterol: 113 mg/dL — ABNORMAL HIGH (ref 0–99)
Total CHOL/HDL Ratio: 4.7 RATIO
Triglycerides: 71 mg/dL (ref <150)
VLDL: 14 mg/dL (ref 0–40)

## 2021-02-05 LAB — COMPREHENSIVE METABOLIC PANEL
ALT: 52 U/L — ABNORMAL HIGH (ref 0–44)
AST: 23 U/L (ref 15–41)
Albumin: 4.1 g/dL (ref 3.5–5.0)
Alkaline Phosphatase: 79 U/L (ref 38–126)
Anion gap: 6 (ref 5–15)
BUN: 10 mg/dL (ref 6–20)
CO2: 28 mmol/L (ref 22–32)
Calcium: 8.7 mg/dL — ABNORMAL LOW (ref 8.9–10.3)
Chloride: 101 mmol/L (ref 98–111)
Creatinine, Ser: 0.86 mg/dL (ref 0.61–1.24)
GFR, Estimated: >60 mL/min (ref >60)
Glucose, Bld: 97 mg/dL (ref 70–99)
Potassium: 4 mmol/L (ref 3.5–5.1)
Sodium: 135 mmol/L (ref 135–145)
Total Bilirubin: 1 mg/dL (ref 0.3–1.2)
Total Protein: 7.4 g/dL (ref 6.5–8.1)

## 2021-02-05 LAB — HIV ANTIBODY (ROUTINE TESTING W REFLEX): HIV Screen 4th Generation wRfx: NONREACTIVE

## 2021-02-05 LAB — HEMOGLOBIN A1C
Hgb A1c MFr Bld: 6.7 % — ABNORMAL HIGH (ref 4.8–5.6)
Mean Plasma Glucose: 145.59 mg/dL

## 2021-02-05 LAB — TSH: TSH: 1.59 u[IU]/mL (ref 0.350–4.500)

## 2021-02-05 MED ORDER — POLYVINYL ALCOHOL 1.4 % OP SOLN
1.0000 [drp] | OPHTHALMIC | Status: DC | PRN
  Administered 2021-02-05: 1 [drp] via OPHTHALMIC
  Filled 2021-02-05: qty 15

## 2021-02-05 MED ORDER — HYDROMORPHONE HCL 1 MG/ML IJ SOLN
1.5000 mg | INTRAMUSCULAR | Status: DC | PRN
  Administered 2021-02-05 – 2021-02-11 (×43): 1.5 mg via INTRAVENOUS
  Filled 2021-02-05 (×44): qty 1.5

## 2021-02-05 MED ORDER — PANTOPRAZOLE SODIUM 40 MG PO TBEC
40.0000 mg | DELAYED_RELEASE_TABLET | Freq: Once | ORAL | Status: AC
  Administered 2021-02-05: 40 mg via ORAL
  Filled 2021-02-05: qty 1

## 2021-02-05 NOTE — Hospital Course (Signed)
Mr. Balis is a 45 year old male with PMH neuroendocrine pancreatic tumor s/p pancreatic tail resection and splenectomy in 2018.  He has had recurrent pancreatitis episodes since surgery. He presented with abdominal pain and decreased appetite consistent with prior episodes of pancreatitis. CT abdomen/pelvis on admission showed mild acute uncomplicated pancreatitis involving the pancreatic head.  No fluid collection, pseudocyst, or abscess seen.  Hepatic steatosis was noted as well. Lipase was elevated at 52.

## 2021-02-05 NOTE — Assessment & Plan Note (Addendum)
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  No etiology has been otherwise found - May need outpatient referral to GI for long-term care - Lipase 52 on admission which may be clinically significant given patient missing portion of pancreas -CT A/P also consistent with acute pancreatitis.  No pseudocyst or abscess seen - check TG (never done before): normal 71 - follow up A1c - continue bowel rest thru today and IVF - pain regimen adjusted  - repeat lipase in am  - Patient reports his last full meal was 12/6 and only had cereal on 12/7, then just water since.  If unable to advance diet by Tuesday, may need to consider postpyloric tube placement to initiate tube feeds

## 2021-02-05 NOTE — Progress Notes (Signed)
Progress Note    Andre Simmons   AST:[id number]  DOB: September 20, 1975  DOA: 02/04/2021     0 PCP: Maryella Shivers, MD  Initial CC: abdominal pain, decreased appetite   Hospital Course: Mr. 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  He has had recurrent pancreatitis episodes since surgery. He presented with abdominal pain and decreased appetite consistent with prior episodes of pancreatitis. CT abdomen/pelvis on admission showed mild acute uncomplicated pancreatitis involving the pancreatic head.  No fluid collection, pseudocyst, or abscess seen.  Hepatic steatosis was noted as well. Lipase was elevated at 52.  Interval History:  Resting in bed this morning.  Still having ongoing abdominal pain requiring pain medication approximately every 3 hours.  He endorses some relief with Dilaudid however wears off before next dose.  Still not hungry and not wishing for initiating any diet today. He does endorse that his last full meal was last Tuesday and he only had cereal on Wednesday.  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  No etiology has been otherwise found - May need outpatient referral to GI for long-term care - Lipase 52 on admission which may be clinically significant given patient missing portion of pancreas -CT A/P also consistent with acute pancreatitis.  No pseudocyst or abscess seen - check TG (never done before): normal 71 - follow up A1c - continue bowel rest thru today and IVF - pain regimen adjusted  - repeat lipase in am  - Patient reports his last full meal was 12/6 and only had cereal on 12/7, then just water since.  If unable to advance diet by Tuesday, may need to consider postpyloric tube placement to initiate tube  feeds  Neuroendocrine tumor of pancreas S/p resection of pancreas tail in 2018 - recurrent pancreatitis since - MRI in April 2020 showed nonenhancing irregular complex fluid collection at pancreatectomy margin.  This was considered possibly pseudocyst or postop collection at the time but follow-up MRI w & w/o IV contrast was recommended in 3 months but was not repeated.  -Lower suspicion for tumor recurrence at this time given would expect to see something on current CT abdomen/pelvis, but can consider outpatient repeat of MRI at follow-up    Old records reviewed in assessment of this patient  Antimicrobials:   DVT prophylaxis: Lovenox  Code Status:   Code Status: Full Code  Disposition Plan:   Status is: Inpatient  Objective: Blood pressure (!) 156/104, pulse 76, temperature (!) 97.4 F (36.3 C), temperature source Oral, resp. rate 20, height 6\' 3"  (1.905 m), weight 111.1 kg, SpO2 97 %.  Examination:  Physical Exam Constitutional:      General: He is not in acute distress.    Appearance: Normal appearance.  HENT:     Head: Normocephalic and atraumatic.     Mouth/Throat:     Mouth: Mucous membranes are moist.  Eyes:     Extraocular Movements: Extraocular movements intact.  Cardiovascular:     Rate and Rhythm: Normal rate and regular rhythm.     Heart sounds: Normal heart sounds.  Pulmonary:     Effort: Pulmonary effort is normal. No respiratory distress.     Breath sounds: Normal breath sounds. No wheezing.  Abdominal:     General: Bowel sounds are normal. There is no distension.  Palpations: Abdomen is soft.     Comments: LUQ TTP noted, no R/G  Musculoskeletal:        General: Normal range of motion.     Cervical back: Normal range of motion and neck supple.  Skin:    General: Skin is warm and dry.  Neurological:     General: No focal deficit present.     Mental Status: He is alert.  Psychiatric:        Mood and Affect: Mood normal.        Behavior:  Behavior normal.     Consultants:    Procedures:    Data Reviewed: I have personally reviewed labs and imaging studies    LOS: 0 days  Time spent: Greater than 50% of the 35 minute visit was spent in counseling/coordination of care for the patient as laid out in the A&P.   Dwyane Dee, MD [facility] 02/05/2021, 12:16 PM

## 2021-02-05 NOTE — Assessment & Plan Note (Addendum)
S/p resection of pancreas tail in 2018 - recurrent pancreatitis since - MRI in April 2020 showed nonenhancing irregular complex fluid collection at pancreatectomy margin.  This was considered possibly pseudocyst or postop collection at the time but follow-up MRI w & w/o IV contrast was recommended in 3 months but was not repeated.  -Lower suspicion for tumor recurrence at this time given would expect to see something on current CT abdomen/pelvis, but can consider outpatient repeat of MRI at follow-up

## 2021-02-06 LAB — CBC WITH DIFFERENTIAL/PLATELET
Abs Immature Granulocytes: 0.05 10*3/uL (ref 0.00–0.07)
Basophils Absolute: 0 10*3/uL (ref 0.0–0.1)
Basophils Relative: 0 %
Eosinophils Absolute: 0.1 10*3/uL (ref 0.0–0.5)
Eosinophils Relative: 1 %
HCT: 42.1 % (ref 39.0–52.0)
Hemoglobin: 14.1 g/dL (ref 13.0–17.0)
Immature Granulocytes: 0 %
Lymphocytes Relative: 30 %
Lymphs Abs: 3.4 10*3/uL (ref 0.7–4.0)
MCH: 32.3 pg (ref 26.0–34.0)
MCHC: 33.5 g/dL (ref 30.0–36.0)
MCV: 96.3 fL (ref 80.0–100.0)
Monocytes Absolute: 1.2 10*3/uL — ABNORMAL HIGH (ref 0.1–1.0)
Monocytes Relative: 10 %
Neutro Abs: 6.7 10*3/uL (ref 1.7–7.7)
Neutrophils Relative %: 59 %
Platelets: 447 10*3/uL — ABNORMAL HIGH (ref 150–400)
RBC: 4.37 MIL/uL (ref 4.22–5.81)
RDW: 13.5 % (ref 11.5–15.5)
WBC: 11.5 10*3/uL — ABNORMAL HIGH (ref 4.0–10.5)
nRBC: 0 % (ref 0.0–0.2)

## 2021-02-06 LAB — MAGNESIUM: Magnesium: 2 mg/dL (ref 1.7–2.4)

## 2021-02-06 LAB — COMPREHENSIVE METABOLIC PANEL
ALT: 42 U/L (ref 0–44)
AST: 21 U/L (ref 15–41)
Albumin: 4 g/dL (ref 3.5–5.0)
Alkaline Phosphatase: 77 U/L (ref 38–126)
Anion gap: 9 (ref 5–15)
BUN: 11 mg/dL (ref 6–20)
CO2: 28 mmol/L (ref 22–32)
Calcium: 8.9 mg/dL (ref 8.9–10.3)
Chloride: 99 mmol/L (ref 98–111)
Creatinine, Ser: 0.96 mg/dL (ref 0.61–1.24)
GFR, Estimated: >60 mL/min (ref >60)
Glucose, Bld: 81 mg/dL (ref 70–99)
Potassium: 4.2 mmol/L (ref 3.5–5.1)
Sodium: 136 mmol/L (ref 135–145)
Total Bilirubin: 0.7 mg/dL (ref 0.3–1.2)
Total Protein: 7.8 g/dL (ref 6.5–8.1)

## 2021-02-06 LAB — LIPASE, BLOOD: Lipase: 31 U/L (ref 11–51)

## 2021-02-06 LAB — PHOSPHORUS: Phosphorus: 4 mg/dL (ref 2.5–4.6)

## 2021-02-06 MED ORDER — ALUM & MAG HYDROXIDE-SIMETH 200-200-20 MG/5ML PO SUSP
30.0000 mL | ORAL | Status: DC | PRN
  Filled 2021-02-06: qty 30

## 2021-02-06 MED ORDER — PANTOPRAZOLE SODIUM 40 MG PO TBEC
40.0000 mg | DELAYED_RELEASE_TABLET | Freq: Two times a day (BID) | ORAL | Status: DC
  Administered 2021-02-06 – 2021-02-12 (×13): 40 mg via ORAL
  Filled 2021-02-06 (×13): qty 1

## 2021-02-06 NOTE — Progress Notes (Signed)
PROGRESS NOTE    Andre Simmons  TIW:[id number] DOB: [birth year] DOA: 02/04/2021 PCP: Maryella Shivers, MD   Chief Complain: Abdominal pain  Brief Narrative: Patient is a 45 year old male with history of neuroendocrine pancreatic tumor s/p resection of the tail of pancreas, splenectomy in 2018, recurrent pancreatitis presents with 1 week history of abdominal pain.  He complained of epigastric pain similar to the prior episodes of enteritis.  Had nausea and vomiting at home with decreased p.o. intake.  Lipase level of 52.  CT abdomen showed mild acute pancreatitis.  Admitted for further management.  Assessment & Plan:   Principal Problem:   Acute pancreatitis Active Problems:   Neuroendocrine tumor of pancreas   Acute pancreatitis: Acute on chronic pancreatitis.  History of recurrent pancreatitis since pancreatic surgery.  Currently n.p.o., will check if he can tolerate clear liquid diet.  Continue pain medication and supportive care.  Continue IV fluids. CT imaging on this admission showed mild acute uncomplicated pancreatitis involving the pancreatic head, no fluid collection, pseudocyst or abscess. He will benefit by follow-up with outpatient gastroenterology. Will try clear liquid diet today.  If he cannot tolerate, we should consider starting on postpyloric feedings  History of neuroendocrine tumor of tail of pancreas: S/p resection in 2018 along with the spleen.  Follow-up with oncology as an outpatient.  CT imaging did not show evidence of tumor recurrence.  Leukocytosis: Mild, improving  New onset diabetes type 2: A1c of 6.7.  Blood sugars well controlled.  He will benefit with low-dose metformin on discharge.  Or can discuss with his PCP for further management  Elevated LDL: LDL of 113.  Follow-up with PCP as an outpatient.  No indication for treatment right away  Obesity: BMI of 30.6         DVT prophylaxis:Lovenox Code Status: Full Family Communication: None at  bedside Patient status:Inpatient  Dispo: The patient is from: Home              Anticipated d/c is to: Home              Anticipated d/c date is: in 1-2 days  Consultants: None  Procedures:None  Antimicrobials:  Anti-infectives (From admission, onward)    None       Subjective:  Patient seen and examined at the bedside this morning.  Hemodynamically stable.  Continues to complain of abdominal discomfort but t ready to try to clear liquid diet today.  There is no abdominal tenderness on examination.  No nausea or vomiting.   Objective: Vitals:   02/05/21 0705 02/05/21 1353 02/05/21 1934 02/06/21 0345  BP: (!) 156/104 127/71 116/74 117/67  Pulse: 76 76 76 67  Resp: 20  20 20   Temp: (!) 97.4 F (36.3 C) 98.4 F (36.9 C) 97.9 F (36.6 C) 98.1 F (36.7 C)  TempSrc: Oral Oral Oral Oral  SpO2: 97% 96% 96% 99%  Weight:      Height:        Intake/Output Summary (Last 24 hours) at 02/06/2021 2505 Last data filed at 02/05/2021 1909 Gross per 24 hour  Intake 708 ml  Output --  Net 708 ml   Filed Weights   02/04/21 1434  Weight: 111.1 kg    Examination:  General exam: Overall comfortable, not in distress,obese HEENT: PERRL Respiratory system:  no wheezes or crackles  Cardiovascular system: S1 & S2 heard, RRR.  Gastrointestinal system: Abdomen is nondistended, soft and nontender. Central nervous system: Alert and oriented Extremities: No edema, no  clubbing ,no cyanosis Skin: No rashes, no ulcers,no icterus      Data Reviewed: I have personally reviewed following labs and imaging studies  CBC: Recent Labs  Lab 02/04/21 1503 02/05/21 0501 02/06/21 0514  WBC 16.1* 14.2* 11.5*  NEUTROABS 10.7*  --  6.7  HGB 14.8 13.9 14.1  HCT 42.2 42.0 42.1  MCV 91.7 96.8 96.3  PLT 467* 469* 416*   Basic Metabolic Panel: Recent Labs  Lab 02/04/21 1503 02/05/21 0501 02/06/21 0514  NA 133* 135 136  K 3.9 4.0 4.2  CL 101 101 99  CO2 23 28 28   GLUCOSE 123* 97 81   BUN 12 10 11   CREATININE 0.95 0.86 0.96  CALCIUM 9.1 8.7* 8.9  MG  --   --  2.0  PHOS  --   --  4.0   GFR: Estimated Creatinine Clearance: 132.1 mL/min (by C-G formula based on SCr of 0.96 mg/dL). Liver Function Tests: Recent Labs  Lab 02/04/21 1503 02/05/21 0501 02/06/21 0514  AST 28 23 21   ALT 62* 52* 42  ALKPHOS 91 79 77  BILITOT 0.7 1.0 0.7  PROT 8.1 7.4 7.8  ALBUMIN 4.2 4.1 4.0   Recent Labs  Lab 02/04/21 1503 02/06/21 0514  LIPASE 52* 31   No results for input(s): AMMONIA in the last 168 hours. Coagulation Profile: No results for input(s): INR, PROTIME in the last 168 hours. Cardiac Enzymes: No results for input(s): CKTOTAL, CKMB, CKMBINDEX, TROPONINI in the last 168 hours. BNP (last 3 results) No results for input(s): PROBNP in the last 8760 hours. HbA1C: Recent Labs    02/05/21 0501  HGBA1C 6.7*   CBG: No results for input(s): GLUCAP in the last 168 hours. Lipid Profile: Recent Labs    02/05/21 0501  CHOL 161  HDL 34*  LDLCALC 113*  TRIG 71  CHOLHDL 4.7   Thyroid Function Tests: Recent Labs    02/05/21 0501  TSH 1.590   Anemia Panel: No results for input(s): VITAMINB12, FOLATE, FERRITIN, TIBC, IRON, RETICCTPCT in the last 72 hours. Sepsis Labs: No results for input(s): PROCALCITON, LATICACIDVEN in the last 168 hours.  Recent Results (from the past 240 hour(s))  Resp Panel by RT-PCR (Flu A&B, Covid) Nasopharyngeal Swab     Status: None   Collection Time: 02/04/21  7:06 PM   Specimen: Nasopharyngeal Swab; Nasopharyngeal(NP) swabs in vial transport medium  Result Value Ref Range Status   SARS Coronavirus 2 by RT PCR NEGATIVE NEGATIVE Final    Comment: (NOTE) SARS-CoV-2 target nucleic acids are NOT DETECTED.  The SARS-CoV-2 RNA is generally detectable in upper respiratory specimens during the acute phase of infection. The lowest concentration of SARS-CoV-2 viral copies this assay can detect is 138 copies/mL. A negative result does not  preclude SARS-Cov-2 infection and should not be used as the sole basis for treatment or other patient management decisions. A negative result may occur with  improper specimen collection/handling, submission of specimen other than nasopharyngeal swab, presence of viral mutation(s) within the areas targeted by this assay, and inadequate number of viral copies(<138 copies/mL). A negative result must be combined with clinical observations, patient history, and epidemiological information. The expected result is Negative.  Fact Sheet for Patients:  EntrepreneurPulse.com.au  Fact Sheet for Healthcare Providers:  IncredibleEmployment.be  This test is no t yet approved or cleared by the [place] FDA and  has been authorized for detection and/or diagnosis of SARS-CoV-2 by FDA under an Emergency Use Authorization (EUA). This EUA will  remain  in effect (meaning this test can be used) for the duration of the COVID-19 declaration under Section 564(b)(1) of the Act, 21 U.S.C.section 360bbb-3(b)(1), unless the authorization is terminated  or revoked sooner.       Influenza A by PCR NEGATIVE NEGATIVE Final   Influenza B by PCR NEGATIVE NEGATIVE Final    Comment: (NOTE) The Xpert Xpress SARS-CoV-2/FLU/RSV plus assay is intended as an aid in the diagnosis of influenza from Nasopharyngeal swab specimens and should not be used as a sole basis for treatment. Nasal washings and aspirates are unacceptable for Xpert Xpress SARS-CoV-2/FLU/RSV testing.  Fact Sheet for Patients: EntrepreneurPulse.com.au  Fact Sheet for Healthcare Providers: IncredibleEmployment.be  This test is not yet approved or cleared by the [place] FDA and has been authorized for detection and/or diagnosis of SARS-CoV-2 by FDA under an Emergency Use Authorization (EUA). This EUA will remain in effect (meaning this test can be used) for the  duration of the COVID-19 declaration under Section 564(b)(1) of the Act, 21 U.S.C. section 360bbb-3(b)(1), unless the authorization is terminated or revoked.  Performed at [facility], [address] [place], [place] [address]          Radiology Studies: CT ABDOMEN PELVIS W CONTRAST  Result Date: 02/04/2021 CLINICAL DATA:  Pancreatitis, abdominal pain for 1 week EXAM: CT ABDOMEN AND PELVIS WITH CONTRAST TECHNIQUE: Multidetector CT imaging of the abdomen and pelvis was performed using the standard protocol following bolus administration of intravenous contrast. CONTRAST:  190mL OMNIPAQUE IOHEXOL 300 MG/ML  SOLN COMPARISON:  06/25/2018, 10/10/2019 FINDINGS: Lower chest: No acute pleural or parenchymal lung disease. Hepatobiliary: Diffuse hepatic steatosis, with persistent focal fatty sparing near the gallbladder fossa. No biliary duct dilation. The gallbladder is unremarkable. Pancreas: Postsurgical changes from distal pancreatectomy. There is mild edema and peripancreatic fat stranding within the pancreatic head, consistent with acute uncomplicated pancreatitis. No fluid collection, pseudocyst, or abscess. Spleen: Surgically absent. Adrenals/Urinary Tract: Adrenal glands are unremarkable. Kidneys are normal, without renal calculi, focal lesion, or hydronephrosis. Bladder is unremarkable. Stomach/Bowel: No bowel obstruction or ileus. Normal appendix right lower quadrant. No bowel wall thickening or inflammatory change. Vascular/Lymphatic: Aortic atherosclerosis. No enlarged abdominal or pelvic lymph nodes. Reproductive: Prostate is unremarkable. Other: No free fluid or free gas.  No abdominal wall hernia. Musculoskeletal: No acute or destructive bony lesions. Reconstructed images demonstrate no additional findings. IMPRESSION: 1. Mild acute uncomplicated pancreatitis involving the pancreatic head. No fluid collection, pseudocyst, or abscess. 2. Postsurgical changes from distal  pancreatectomy and splenectomy. 3. Hepatic steatosis, with areas of focal fatty sparing near the gallbladder fossa. 4.  Aortic Atherosclerosis (ICD10-I70.0). Electronically Signed   By: Randa Ngo M.D.   On: 02/04/2021 16:26        Scheduled Meds:  enoxaparin (LOVENOX) injection  40 mg Subcutaneous Q24H   Continuous Infusions:  lactated ringers 125 mL/hr at 02/06/21 0638     LOS: 1 day    Time spent: 25 mins.More than 50% of that time was spent in counseling and/or coordination of care.      Shelly Coss, MD [facility] P12/13/2022, 9:16 AM

## 2021-02-07 ENCOUNTER — Inpatient Hospital Stay (HOSPITAL_COMMUNITY): Payer: Medicare HMO

## 2021-02-07 DIAGNOSIS — R7401 Elevation of levels of liver transaminase levels: Secondary | ICD-10-CM

## 2021-02-07 DIAGNOSIS — D75839 Thrombocytosis, unspecified: Secondary | ICD-10-CM

## 2021-02-07 DIAGNOSIS — D72829 Elevated white blood cell count, unspecified: Secondary | ICD-10-CM

## 2021-02-07 LAB — CBC WITH DIFFERENTIAL/PLATELET
Abs Immature Granulocytes: 0.04 10*3/uL (ref 0.00–0.07)
Basophils Absolute: 0.1 10*3/uL (ref 0.0–0.1)
Basophils Relative: 1 %
Eosinophils Absolute: 0.3 10*3/uL (ref 0.0–0.5)
Eosinophils Relative: 3 %
HCT: 45.3 % (ref 39.0–52.0)
Hemoglobin: 15 g/dL (ref 13.0–17.0)
Immature Granulocytes: 0 %
Lymphocytes Relative: 40 %
Lymphs Abs: 4.1 10*3/uL — ABNORMAL HIGH (ref 0.7–4.0)
MCH: 32.1 pg (ref 26.0–34.0)
MCHC: 33.1 g/dL (ref 30.0–36.0)
MCV: 96.8 fL (ref 80.0–100.0)
Monocytes Absolute: 1.1 10*3/uL — ABNORMAL HIGH (ref 0.1–1.0)
Monocytes Relative: 11 %
Neutro Abs: 4.7 10*3/uL (ref 1.7–7.7)
Neutrophils Relative %: 45 %
Platelets: 459 10*3/uL — ABNORMAL HIGH (ref 150–400)
RBC: 4.68 MIL/uL (ref 4.22–5.81)
RDW: 13.4 % (ref 11.5–15.5)
WBC: 10.3 10*3/uL (ref 4.0–10.5)
nRBC: 0 % (ref 0.0–0.2)

## 2021-02-07 LAB — COMPREHENSIVE METABOLIC PANEL
ALT: 50 U/L — ABNORMAL HIGH (ref 0–44)
AST: 32 U/L (ref 15–41)
Albumin: 4.4 g/dL (ref 3.5–5.0)
Alkaline Phosphatase: 87 U/L (ref 38–126)
Anion gap: 11 (ref 5–15)
BUN: 12 mg/dL (ref 6–20)
CO2: 28 mmol/L (ref 22–32)
Calcium: 9.4 mg/dL (ref 8.9–10.3)
Chloride: 97 mmol/L — ABNORMAL LOW (ref 98–111)
Creatinine, Ser: 1.1 mg/dL (ref 0.61–1.24)
GFR, Estimated: >60 mL/min (ref >60)
Glucose, Bld: 84 mg/dL (ref 70–99)
Potassium: 3.9 mmol/L (ref 3.5–5.1)
Sodium: 136 mmol/L (ref 135–145)
Total Bilirubin: 0.6 mg/dL (ref 0.3–1.2)
Total Protein: 8.3 g/dL — ABNORMAL HIGH (ref 6.5–8.1)

## 2021-02-07 LAB — PHOSPHORUS: Phosphorus: 3.3 mg/dL (ref 2.5–4.6)

## 2021-02-07 LAB — MAGNESIUM: Magnesium: 2.1 mg/dL (ref 1.7–2.4)

## 2021-02-07 MED ORDER — GADOBUTROL 1 MMOL/ML IV SOLN
10.0000 mL | Freq: Once | INTRAVENOUS | Status: AC | PRN
  Administered 2021-02-07: 23:00:00 10 mL via INTRAVENOUS

## 2021-02-07 NOTE — Progress Notes (Signed)
PROGRESS NOTE    Andre Simmons  GEX:[id number] DOB: 08/17/1975 DOA: 02/04/2021 PCP: Maryella Shivers, MD  Brief Narrative:  The patient is a 45 year old obese Caucasian male with a past medical history significant for but #2 neuroendocrine pancreatic tumor status postresection of the tail of the pancreas, history of splenectomy 2018 as well as recurrent pancreatitis presents with 1 week history of abdominal pain.  He is complained of epigastric pain similar to episodes of pancreatitis.  He had nausea vomiting with decreased p.o. intake.  Lipase was elevated 52.  CT of the abdomen pelvis was done and showed mild acute pancreatitis.  He was admitted for further management of his acute on chronic pancreatitis and GIs been consulted.  He remains on a clear liquid diet with no real improvement.  Assessment & Plan:   Principal Problem:   Acute pancreatitis Active Problems:   Neuroendocrine tumor of pancreas  Acute pancreatitis and a history of patient with chronic pancreatitis -Has had a history of recurrent pancreatitis since his pancreatic surgery and was n.p.o. yesterday and placed on a clear liquid diet and was attempted to go to full liquid diet this morning but because patient had not tolerated clear liquid diet will cut back to clear liquid diet -We will continue supportive care and IV fluid hydration with lactated Ringer's at 125 MLS per hour -Continue with antiemetics with ondansetron 4 mg p.o./IV every 6 as needed nausea -Lipase level 1 from 52 and trended down to 31 but patient still having pain -Continuing pantoprazole 40 mg p.o. twice daily -Continue with pain control with hydromorphone 1.5 mg every 3 hours as needed severe pain -CT scan of the abdomen pelvis showed "Mild acute uncomplicated pancreatitis involving the pancreatic head. No fluid collection, pseudocyst, or abscess. Postsurgical changes from distal pancreatectomy and splenectomy. Hepatic steatosis, with areas of focal  fatty sparing near the gallbladder fossa. Aortic Atherosclerosis." -GI has been consulted for further evaluation recommendations -Unfortunately patient has not really been able to tolerate his clear liquid diet and so we will need to consider starting the patient on postpyloric feedings  History of neuroendocrine tumor of the tail of the pancreas -He is also had associated resection of the spleen in 2018 -Need to follow-up with oncology in outpatient setting -CT scan of abdomen pelvis as above did not show any evidence of any tumor recurrence  Abnormal ALT in the setting of Hepatic Steatosis -In the setting of pancreatitis -Patient's ALT trend showed ALT of 62 -> 52 -> 42 -> 50 -Continue to monitor and trend and if necessary will obtain further imaging with a right upper quadrant ultrasound  Leukocytosis -Mild and likely reactive in the setting of pancreatitis -WBC is trended down and normalized as it went from 16.1 -> 14.2 -> 11.5 -> 10.3 -Continue to monitor for signs and symptoms of infection -Repeat CBC in a.m.  New onset Diabetes Mellitus Type 2 -Patient's hemoglobin A1c was 6.7 -Continue monitor CBGs carefully and if necessary will place on sensitive NovoLog/scale insulin AC -He will benefit from low-dose metformin once he is at discharge or he can follow-up and discuss with PCP for further management  Hyperlipidemia/elevated LDL -Patient is lipid panel done and showed a total cholesterol/HDL ratio of 4.7, cholesterol level 161, HDL 34, LDL 113, triglycerides of 71, and VLDL 14 -Immediate indication for treatment as an inpatient and can follow-up with the PCP in outpatient setting to be placed on statin  Thrombocytosis -Mild and likely reactive in the setting of above -  Patient's platelet count went from 467 -> 469 -> 447 -> 459 -Continue to monitor and trend and repeat CBC in a.m.  Obesity -Complicates overall prognosis and care -Estimated body mass index is 30.62 kg/m as  calculated from the following:   Height as of this encounter: 6\' 3"  (1.905 m).   Weight as of this encounter: 111.1 kg.  -Weight Loss and Dietary Counseling given   DVT prophylaxis: Enoxaparin 40 mg subcu q. 24  Code Status: FULL CODE Family Communication: No family present at bedside  Disposition Plan: Pending further clinical improvement of his pancreatitis and tolerance of diet  Status is: Inpatient  Remains inpatient appropriate because: Will still need to tolerate p.o. intake and improvement of his pancreatitis  Consultants:  Gastroenterology   Procedures: None  Antimicrobials:  Anti-infectives (From admission, onward)    None        Subjective: Seen and examined at bedside and was still having some abdominal pain and nausea.  Did not tolerate his clear liquid diet and states that he had pain and discomfort and symptoms after he ate chicken broth last night.  No other concerns or complaints at this time and feels okay.  Objective: Vitals:   02/06/21 0345 02/06/21 1300 02/06/21 2045 02/07/21 0355  BP: 117/67 123/75 119/72 125/90  Pulse: 67 75 63 73  Resp: 20 20 20 20   Temp: 98.1 F (36.7 C) 98.2 F (36.8 C) 97.7 F (36.5 C) 98 F (36.7 C)  TempSrc: Oral Oral Oral Oral  SpO2: 99% 99% 93% 92%  Weight:      Height:        Intake/Output Summary (Last 24 hours) at 02/07/2021 1232 Last data filed at 02/07/2021 0600 Gross per 24 hour  Intake 5976.45 ml  Output --  Net 5976.45 ml   Filed Weights   02/04/21 1434  Weight: 111.1 kg   Examination: Physical Exam:  Constitutional: WN/WD obese Caucasian male currently in no acute distress appears calm but slightly uncomfortable Eyes: Lids and conjunctivae normal, sclerae anicteric  ENMT: External Ears, Nose appear normal. Grossly normal hearing. Neck: Appears normal, supple, no cervical masses, normal ROM, no appreciable thyromegaly; no appreciable JVD Respiratory: Diminished to auscultation bilaterally, no  wheezing, rales, rhonchi or crackles. Normal respiratory effort and patient is not tachypenic. No accessory muscle use.  Unlabored breathing Cardiovascular: RRR, no murmurs / rubs / gallops. S1 and S2 auscultated.  Trace extremity edema Abdomen: Soft, tender to palpate, distended secondary body habitus. Bowel sounds positive.  GU: Deferred. Musculoskeletal: No clubbing / cyanosis of digits/nails. No joint deformity upper and lower extremities.  Skin: No rashes, lesions, ulcers on a limited skin evaluation. No induration; Warm and dry.  Neurologic: CN 2-12 grossly intact with no focal deficits. Romberg sign and cerebellar reflexes not assessed.  Psychiatric: Normal judgment and insight. Alert and oriented x 3. Normal mood and appropriate affect.   Data Reviewed: I have personally reviewed following labs and imaging studies  CBC: Recent Labs  Lab 02/04/21 1503 02/05/21 0501 02/06/21 0514 02/07/21 0529  WBC 16.1* 14.2* 11.5* 10.3  NEUTROABS 10.7*  --  6.7 4.7  HGB 14.8 13.9 14.1 15.0  HCT 42.2 42.0 42.1 45.3  MCV 91.7 96.8 96.3 96.8  PLT 467* 469* 447* 062*   Basic Metabolic Panel: Recent Labs  Lab 02/04/21 1503 02/05/21 0501 02/06/21 0514 02/07/21 0529  NA 133* 135 136 136  K 3.9 4.0 4.2 3.9  CL 101 101 99 97*  CO2 23 28 28  28  GLUCOSE 123* 97 81 84  BUN 12 10 11 12   CREATININE 0.95 0.86 0.96 1.10  CALCIUM 9.1 8.7* 8.9 9.4  MG  --   --  2.0 2.1  PHOS  --   --  4.0 3.3   GFR: Estimated Creatinine Clearance: 115.3 mL/min (by C-G formula based on SCr of 1.1 mg/dL). Liver Function Tests: Recent Labs  Lab 02/04/21 1503 02/05/21 0501 02/06/21 0514 02/07/21 0529  AST 28 23 21  32  ALT 62* 52* 42 50*  ALKPHOS 91 79 77 87  BILITOT 0.7 1.0 0.7 0.6  PROT 8.1 7.4 7.8 8.3*  ALBUMIN 4.2 4.1 4.0 4.4   Recent Labs  Lab 02/04/21 1503 02/06/21 0514  LIPASE 52* 31   No results for input(s): AMMONIA in the last 168 hours. Coagulation Profile: No results for input(s): INR,  PROTIME in the last 168 hours. Cardiac Enzymes: No results for input(s): CKTOTAL, CKMB, CKMBINDEX, TROPONINI in the last 168 hours. BNP (last 3 results) No results for input(s): PROBNP in the last 8760 hours. HbA1C: Recent Labs    02/05/21 0501  HGBA1C 6.7*   CBG: No results for input(s): GLUCAP in the last 168 hours. Lipid Profile: Recent Labs    02/05/21 0501  CHOL 161  HDL 34*  LDLCALC 113*  TRIG 71  CHOLHDL 4.7   Thyroid Function Tests: Recent Labs    02/05/21 0501  TSH 1.590   Anemia Panel: No results for input(s): VITAMINB12, FOLATE, FERRITIN, TIBC, IRON, RETICCTPCT in the last 72 hours. Sepsis Labs: No results for input(s): PROCALCITON, LATICACIDVEN in the last 168 hours.  Recent Results (from the past 240 hour(s))  Resp Panel by RT-PCR (Flu A&B, Covid) Nasopharyngeal Swab     Status: None   Collection Time: 02/04/21  7:06 PM   Specimen: Nasopharyngeal Swab; Nasopharyngeal(NP) swabs in vial transport medium  Result Value Ref Range Status   SARS Coronavirus 2 by RT PCR NEGATIVE NEGATIVE Final    Comment: (NOTE) SARS-CoV-2 target nucleic acids are NOT DETECTED.  The SARS-CoV-2 RNA is generally detectable in upper respiratory specimens during the acute phase of infection. The lowest concentration of SARS-CoV-2 viral copies this assay can detect is 138 copies/mL. A negative result does not preclude SARS-Cov-2 infection and should not be used as the sole basis for treatment or other patient management decisions. A negative result may occur with  improper specimen collection/handling, submission of specimen other than nasopharyngeal swab, presence of viral mutation(s) within the areas targeted by this assay, and inadequate number of viral copies(<138 copies/mL). A negative result must be combined with clinical observations, patient history, and epidemiological information. The expected result is Negative.  Fact Sheet for Patients:   EntrepreneurPulse.com.au  Fact Sheet for Healthcare Providers:  IncredibleEmployment.be  This test is no t yet approved or cleared by the [place] FDA and  has been authorized for detection and/or diagnosis of SARS-CoV-2 by FDA under an Emergency Use Authorization (EUA). This EUA will remain  in effect (meaning this test can be used) for the duration of the COVID-19 declaration under Section 564(b)(1) of the Act, 21 U.S.C.section 360bbb-3(b)(1), unless the authorization is terminated  or revoked sooner.       Influenza A by PCR NEGATIVE NEGATIVE Final   Influenza B by PCR NEGATIVE NEGATIVE Final    Comment: (NOTE) The Xpert Xpress SARS-CoV-2/FLU/RSV plus assay is intended as an aid in the diagnosis of influenza from Nasopharyngeal swab specimens and should not be used as a  sole basis for treatment. Nasal washings and aspirates are unacceptable for Xpert Xpress SARS-CoV-2/FLU/RSV testing.  Fact Sheet for Patients: EntrepreneurPulse.com.au  Fact Sheet for Healthcare Providers: IncredibleEmployment.be  This test is not yet approved or cleared by the [place] FDA and has been authorized for detection and/or diagnosis of SARS-CoV-2 by FDA under an Emergency Use Authorization (EUA). This EUA will remain in effect (meaning this test can be used) for the duration of the COVID-19 declaration under Section 564(b)(1) of the Act, 21 U.S.C. section 360bbb-3(b)(1), unless the authorization is terminated or revoked.  Performed at [facility] [address], [address] [place], [place] [address]     RN Pressure Injury Documentation:     Estimated body mass index is 30.62 kg/m as calculated from the following:   Height as of this encounter: 6\' 3"  (1.905 m).   Weight as of this encounter: 111.1 kg.  Malnutrition Type:   Malnutrition Characteristics:   Nutrition Interventions:    Radiology  Studies: No results found.  Scheduled Meds:  enoxaparin (LOVENOX) injection  40 mg Subcutaneous Q24H   pantoprazole  40 mg Oral BID   Continuous Infusions:  lactated ringers 125 mL/hr at 02/07/21 0002    LOS: 2 days   Kerney Elbe, DO [facility] PAGER is on AMION  If 7PM-7AM, please contact night-coverage [email or web address]

## 2021-02-07 NOTE — Consult Note (Addendum)
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 Barry Dienes in October 2019   # Recurrent acute mild pancreatitis. This seems to be at least his second documented episode since distal pancreatectomy in Oct 2019. First episode was in April 2020 at which time MRI suggested mild pancreatitis. Etiology unclear, doesn't have usual risk factors. Liver tests unremarkable, no biliary duct dilation on CTAP.  --Patient does not drink Etoh. Uncle recently passed away with pancreatic cancer.  --Not on any medications commonly associated with pancreatitis.  --Liver chemistries okay except for trivial elevation in ALT in setting of fatty liver.  --Normal serum Ca+, normal Triglycerides --Dr. Ardis Hughs to review CTAP --Supportive care in the interim. His degree of pain seems out of proportion to CT scan findings. Percocet not helping at all.  --Clear liquids okay --May need RUQ to evaluate for gallstones..   #  Elevated A1c at 6.7%. Patient gives no history of diabetes.   # Additional medical history listed below.   HISTORY OF PRESENT ILLNESS                                                                                                                         Chief Complaint: pancreatitis  Andre Simmons is a 45 y.o. male with a past medical history significant for chronic GERD, IBS, chronic back pain.  See [facility] for any additional medical problems.   Patient evaluated by [place] in 2019 for pancreatic mass seen on MRI. He had EUS with FNA 09/25/20 Ardis Hughs). Diagnosed with 3.5 cm neuroendocrine tumor in tail of pancreas. The procedure was complicated by pancreatitis requiring hospital admission. In October 2019 he underwent distal  pancreatectomy and splenectomy by Dr. Barry Dienes.   Since surgery patient has continued to get episodes of epigastric pain about twice a year. On two occasions he has required admission. He was admitted in late April 2020 for these symptoms. For unclear reasons he was seen by [facility] during that admission ( he was already established with [place]). CTAP showed a mass at resection site but no inflammation of pancreas.  MRI showed a 3.9 cm irregular mass at resection site and it did suggest mild pancreatitis.  His epigastric pain was felt to be related to the mass but EGD was offered. Patient nor Dr. Oletta Lamas apparently felt EGD would be low yield so it wasn't done.  However patient says he was readmitted in [facility] late 2020 or early 2021 for upper abdominal pain. Patient says a CT scan was done, he doesn't know results but was told he had pancreatitis.   He has continued to have a couple  of episodes of epigastric pain, nausea / vomiting a year.  Pain sometimes radiates through to his back.  When pain starts he drinks only water, doesn't eat and pain usually subsides after 3-4 days.  If symptoms persistent then he goes to ED and is generally admitted. Pain doesn't always respond to being NPO and these are the times he has ended up being admitted.   Patient has GERD. He takes Omeprazole 40 mg BID which controls symptoms. PCP manages GERD. Maternal GM, maternal GF and Paternal GM all had esophageal cancer.   ED course:  Patient presented to ED 12/11 for evaluation of recurrent epigastric pain which started early Tuesday morning. Pain woke him up, it was radiating through to his back. He had associated N / V.  Tried to eat cereal, pain escalated. Put himself on water only diet. Went to [employer]. WBC 10.3, hct 45%. Renal function normal. Very mild elevation in ALT , liver chemistries otherwise normal. Triglycerides normal. Serum Ca+ normal. admitted with mild pancreatitis. He continues to have nearly  constant epigastric pain. Percocet doesn't help pain at all. He continues to feel nauseated, no vomiting since yesterday when he attempted a clear liquid diet.    IMAGING CTAP w/ contrast 02/04/21 CLINICAL DATA:  Pancreatitis, abdominal pain for 1 week   EXAM: CT ABDOMEN AND PELVIS WITH CONTRAST   TECHNIQUE: Multidetector CT imaging of the abdomen and pelvis was performed using the standard protocol following bolus administration of intravenous contrast.   CONTRAST:  168mL OMNIPAQUE IOHEXOL 300 MG/ML  SOLN   COMPARISON:  06/25/2018, 10/10/2019   FINDINGS: Lower chest: No acute pleural or parenchymal lung disease.   Hepatobiliary: Diffuse hepatic steatosis, with persistent focal fatty sparing near the gallbladder fossa. No biliary duct dilation. The gallbladder is unremarkable.   Pancreas: Postsurgical changes from distal pancreatectomy. There is mild edema and peripancreatic fat stranding within the pancreatic head, consistent with acute uncomplicated pancreatitis. No fluid collection, pseudocyst, or abscess.   Spleen: Surgically absent.   Adrenals/Urinary Tract: Adrenal glands are unremarkable. Kidneys are normal, without renal calculi, focal lesion, or hydronephrosis. Bladder is unremarkable.   Stomach/Bowel: No bowel obstruction or ileus. Normal appendix right lower quadrant. No bowel wall thickening or inflammatory change.   Vascular/Lymphatic: Aortic atherosclerosis. No enlarged abdominal or pelvic lymph nodes.   Reproductive: Prostate is unremarkable.   Other: No free fluid or free gas.  No abdominal wall hernia.   Musculoskeletal: No acute or destructive bony lesions. Reconstructed images demonstrate no additional findings.   IMPRESSION: 1. Mild acute uncomplicated pancreatitis involving the pancreatic head. No fluid collection, pseudocyst, or abscess. 2. Postsurgical changes from distal pancreatectomy and splenectomy. 3. Hepatic steatosis, with areas of  focal fatty sparing near the gallbladder fossa. 4.  Aortic Atherosclerosis (ICD10-I70.0).       PREVIOUS ENDOSCOPIC EVALUATIONS  / IMAGING STUDIES     Past Medical History:  Diagnosis Date   Arthritis    Chronic back pain    Depression    GERD (gastroesophageal reflux disease)    Knee pain    Pancreatic tumor     Past Surgical History:  Procedure Laterality Date   BACK SURGERY  03/2018   Dr Trenton Gammon.  L4-L5, S1 micro discectomy   EUS N/A 09/25/2017   Procedure: UPPER ENDOSCOPIC ULTRASOUND (EUS) LINEAR;  Surgeon: Milus Banister, MD;  Location: [facility];  Service: Endoscopy;  Laterality: N/A;  Radial and Linear   FINE NEEDLE ASPIRATION  09/25/2017   Procedure:  FINE NEEDLE ASPIRATION (FNA) RADIAL;  Surgeon: Milus Banister, MD;  Location: [facility];  Service: Endoscopy;;   KNEE SURGERY Left    x 20 surgeries   LAPAROSCOPIC DISTAL PANCREATECTOMY  11/26/2017   Diagnostic laparoscopy, laparoscopic hand-assisted distal    MOUTH SURGERY     as a child, unsure if wisdom teeth or tonsils   PANCREATECTOMY N/A 11/25/2017   Procedure: LAPAROSCOPIC DISTAL PANCREATECTOMY AND SPLENECTOMY ERAS PATHWAY;  Surgeon: Stark Klein, MD;  Location: [place];  Service: General;  Laterality: N/A;   Removal of Spleen  2019    Prior to Admission medications   Medication Sig Start Date End Date Taking? Authorizing Provider  albuterol (VENTOLIN HFA) 108 (90 Base) MCG/ACT inhaler Inhale 1-2 puffs into the lungs See admin instructions. Every 4 to 6 hours as needed for wheezing/shortness of breath 11/20/20  Yes [provider]  ibuprofen (ADVIL) 200 MG tablet Take 400 mg by mouth every 6 (six) hours as needed for mild pain.   Yes [provider]  Multiple Vitamins-Minerals (MENS MULTIPLUS PO) Take 1 tablet by mouth daily.   Yes [provider]  omeprazole (PRILOSEC) 40 MG capsule Take 40 mg by mouth 2 (two) times daily.    Yes [provider]    Current  Facility-Administered Medications  Medication Dose Route Frequency Provider Last Rate Last Admin   acetaminophen (TYLENOL) tablet 650 mg  650 mg Oral Q6H PRN Etta Quill, DO       Or   acetaminophen (TYLENOL) suppository 650 mg  650 mg Rectal Q6H PRN Etta Quill, DO       alum & mag hydroxide-simeth (MAALOX/MYLANTA) 200-200-20 MG/5ML suspension 30 mL  30 mL Oral Q4H PRN Shelly Coss, MD       enoxaparin (LOVENOX) injection 40 mg  40 mg Subcutaneous Q24H Alcario Drought, Jared M, DO   40 mg at 02/07/21 1121   HYDROmorphone (DILAUDID) injection 1.5 mg  1.5 mg Intravenous Q3H PRN Dwyane Dee, MD   1.5 mg at 02/07/21 1122   lactated ringers infusion   Intravenous Continuous Etta Quill, DO 125 mL/hr at 02/07/21 0002 New Bag at 02/07/21 0002   ondansetron (ZOFRAN) tablet 4 mg  4 mg Oral Q6H PRN Etta Quill, DO       Or   ondansetron [facility]) injection 4 mg  4 mg Intravenous Q6H PRN Etta Quill, DO   4 mg at 02/07/21 4696   pantoprazole (PROTONIX) EC tablet 40 mg  40 mg Oral BID Shelly Coss, MD   40 mg at 02/07/21 1121   polyvinyl alcohol (LIQUIFILM TEARS) 1.4 % ophthalmic solution 1 drop  1 drop Both Eyes PRN Dwyane Dee, MD   1 drop at 02/05/21 2123    Allergies as of 02/04/2021 - Review Complete 02/04/2021  Allergen Reaction Noted   Adhesive [tape] Rash and Other (See Comments) 12/01/2018    Family History  Problem Relation Age of Onset   Thyroid cancer Mother    Other Father        MVA at age 85   Esophageal cancer Maternal Grandmother        smoker   Breast cancer Maternal Grandmother    Esophageal cancer Paternal Grandmother    Colon cancer Neg Hx    Rectal cancer Neg Hx     Social History   Socioeconomic History   Marital status: Single    Spouse name: Not on file   Number of children: 2  Years of education: Not on file   Highest education level: Not on file  Occupational History   Occupation: Disabled  Tobacco Use   Smoking status: Former    Smokeless tobacco: Never   Tobacco comments:    Quit 14-15 years ago (as of 2019)  Vaping Use   Vaping Use: Never used  Substance and Sexual Activity   Alcohol use: No   Drug use: Yes    Types: Psilocybin   Sexual activity: Not on file  Other Topics Concern   Not on file  Social History Narrative   Not on file   Social Determinants of Health   Financial Resource Strain: Not on file  Food Insecurity: Not on file  Transportation Needs: Not on file  Physical Activity: Not on file  Stress: Not on file  Social Connections: Not on file  Intimate Partner Violence: Not on file    Review of Systems: All systems reviewed and negative except where noted in HPI.   OBJECTIVE    Physical Exam: Vital signs in last 24 hours: Temp:  [97.7 F (36.5 C)-98.2 F (36.8 C)] 98 F (36.7 C) (12/14 0355) Pulse Rate:  [63-75] 73 (12/14 0355) Resp:  [20] 20 (12/14 0355) BP: (119-125)/(72-90) 125/90 (12/14 0355) SpO2:  [92 %-99 %] 92 % (12/14 0355) Last BM Date: 01/31/21  General:  Alert male in NAD Psych:  Pleasant, cooperative. Normal mood and affect Eyes: Pupils equal, no icterus. Conjunctive pink Ears:  Normal auditory acuity Nose: No deformity, discharge or lesions Neck:  Supple, no masses felt Lungs:  Clear to auscultation.  Heart:  Regular rate, regular rhythm. No lower extremity edema Abdomen:  Soft, nondistended, nontender, active bowel sounds, no masses felt Rectal :  Deferred Msk: Symmetrical without gross deformities.  Neurologic:  Alert, oriented, grossly normal neurologically Skin:  Intact without significant lesions.    Scheduled inpatient medications   Intake/Output from previous day: 12/13 0701 - 12/14 0700 In: 5976.5 [I.V.:5976.5] Out: -  Intake/Output this shift: No intake/output data recorded.   Lab Results: Recent Labs    02/05/21 0501 02/06/21 0514 02/07/21 0529  WBC 14.2* 11.5* 10.3  HGB 13.9 14.1 15.0  HCT 42.0 42.1 45.3  PLT 469* 447*  459*   BMET Recent Labs    02/05/21 0501 02/06/21 0514 02/07/21 0529  NA 135 136 136  K 4.0 4.2 3.9  CL 101 99 97*  CO2 28 28 28   GLUCOSE 97 81 84  BUN 10 11 12   CREATININE 0.86 0.96 1.10  CALCIUM 8.7* 8.9 9.4   LFTs Recent Labs    02/07/21 0529  PROT 8.3*  ALBUMIN 4.4  AST 32  ALT 50*  ALKPHOS 87  BILITOT 0.6   PT/INR No results for input(s): LABPROT, INR in the last 72 hours. Hepatitis Panel No results for input(s): HEPBSAG, HCVAB, HEPAIGM, HEPBIGM in the last 72 hours.   . CBC Latest Ref Rng & Units 02/07/2021 02/06/2021 02/05/2021  WBC 4.0 - 10.5 K/uL 10.3 11.5(H) 14.2(H)  Hemoglobin 13.0 - 17.0 g/dL 15.0 14.1 13.9  Hematocrit 39.0 - 52.0 % 45.3 42.1 42.0  Platelets 150 - 400 K/uL 459(H) 447(H) 469(H)    . CMP Latest Ref Rng & Units 02/07/2021 02/06/2021 02/05/2021  Glucose 70 - 99 mg/dL 84 81 97  BUN 6 - 20 mg/dL 12 11 10   Creatinine 0.61 - 1.24 mg/dL 1.10 0.96 0.86  Sodium 135 - 145 mmol/L 136 136 135  Potassium 3.5 - 5.1 mmol/L 3.9 4.2  4.0  Chloride 98 - 111 mmol/L 97(L) 99 101  CO2 22 - 32 mmol/L 28 28 28   Calcium 8.9 - 10.3 mg/dL 9.4 8.9 8.7(L)  Total Protein 6.5 - 8.1 g/dL 8.3(H) 7.8 7.4  Total Bilirubin 0.3 - 1.2 mg/dL 0.6 0.7 1.0  Alkaline Phos 38 - 126 U/L 87 77 79  AST 15 - 41 U/L 32 21 23  ALT 0 - 44 U/L 50(H) 42 52(H)    Tye Savoy, NP-C @  02/07/2021, 11:41 AM  ________________________________________________________________________  [facility] MD note:  I personally examined the patient, reviewed the data and agree with the assessment and plan described above.  He had a solid pseudopapillary tumor of the pancreatic tail resected in 2019. Since then he's had several episodes of pretty intense abd pains, twice been admitted with what appears to be mild acute pancreatitis. Between these episodes he feels completely fine. Not losing weight.  Uncle just died of pancreatic cancer.    I'm not sure what is causing these repeated  episodes, that probably are mild acute pancreatitis.  I think we should prove as best as possible whether he has recurrence of the tumor or not and so we are ordering MR of the pancreas.  Should continue usual conservative measures for AP: IV fluids, pain control. He is tolerating liquid diet fairly well and it is ok to continue that.   Owens Loffler, MD [facility] Pager [phone]

## 2021-02-08 LAB — CBC WITH DIFFERENTIAL/PLATELET
Abs Immature Granulocytes: 0.03 10*3/uL (ref 0.00–0.07)
Basophils Absolute: 0.1 10*3/uL (ref 0.0–0.1)
Basophils Relative: 1 %
Eosinophils Absolute: 0.4 10*3/uL (ref 0.0–0.5)
Eosinophils Relative: 4 %
HCT: 41.5 % (ref 39.0–52.0)
Hemoglobin: 13.7 g/dL (ref 13.0–17.0)
Immature Granulocytes: 0 %
Lymphocytes Relative: 34 %
Lymphs Abs: 3 10*3/uL (ref 0.7–4.0)
MCH: 31.7 pg (ref 26.0–34.0)
MCHC: 33 g/dL (ref 30.0–36.0)
MCV: 96.1 fL (ref 80.0–100.0)
Monocytes Absolute: 1.5 10*3/uL — ABNORMAL HIGH (ref 0.1–1.0)
Monocytes Relative: 16 %
Neutro Abs: 4.1 10*3/uL (ref 1.7–7.7)
Neutrophils Relative %: 45 %
Platelets: 469 10*3/uL — ABNORMAL HIGH (ref 150–400)
RBC: 4.32 MIL/uL (ref 4.22–5.81)
RDW: 13.2 % (ref 11.5–15.5)
WBC: 9.1 10*3/uL (ref 4.0–10.5)
nRBC: 0 % (ref 0.0–0.2)

## 2021-02-08 LAB — COMPREHENSIVE METABOLIC PANEL
ALT: 52 U/L — ABNORMAL HIGH (ref 0–44)
AST: 33 U/L (ref 15–41)
Albumin: 3.9 g/dL (ref 3.5–5.0)
Alkaline Phosphatase: 78 U/L (ref 38–126)
Anion gap: 11 (ref 5–15)
BUN: 11 mg/dL (ref 6–20)
CO2: 28 mmol/L (ref 22–32)
Calcium: 9 mg/dL (ref 8.9–10.3)
Chloride: 97 mmol/L — ABNORMAL LOW (ref 98–111)
Creatinine, Ser: 1.1 mg/dL (ref 0.61–1.24)
GFR, Estimated: >60 mL/min (ref >60)
Glucose, Bld: 81 mg/dL (ref 70–99)
Potassium: 3.8 mmol/L (ref 3.5–5.1)
Sodium: 136 mmol/L (ref 135–145)
Total Bilirubin: 1 mg/dL (ref 0.3–1.2)
Total Protein: 7.5 g/dL (ref 6.5–8.1)

## 2021-02-08 LAB — PHOSPHORUS: Phosphorus: 3.4 mg/dL (ref 2.5–4.6)

## 2021-02-08 LAB — MAGNESIUM: Magnesium: 1.8 mg/dL (ref 1.7–2.4)

## 2021-02-08 MED ORDER — DOCUSATE SODIUM 100 MG PO CAPS
100.0000 mg | ORAL_CAPSULE | Freq: Every day | ORAL | Status: DC | PRN
  Administered 2021-02-08: 100 mg via ORAL
  Filled 2021-02-08: qty 1

## 2021-02-08 NOTE — Progress Notes (Signed)
PROGRESS NOTE    Andre Simmons  XLK:[id number] DOB: 04/18/1975 DOA: 02/04/2021 PCP: Maryella Shivers, MD  Brief Narrative:  The patient is a 45 year old obese Caucasian male with a past medical history significant for but #2 neuroendocrine pancreatic tumor status postresection of the tail of the pancreas, history of splenectomy 2018 as well as recurrent pancreatitis presents with 1 week history of abdominal pain.  He is complained of epigastric pain similar to episodes of pancreatitis.  He had nausea vomiting with decreased p.o. intake.  Lipase was elevated 52.  CT of the abdomen pelvis was done and showed mild acute pancreatitis.  He was admitted for further management of his acute on chronic pancreatitis and GIs been consulted.  He remained on a clear liquid diet yesterday with no real improvement so GI was consulted and they ordered an MRCP MRI of the pancreas MRI which showed mild acute pancreatitis.  He is feeling little bit better today and continues to take round-the-clock pain medication.  We will advance diet to full liquid diet today.  Assessment & Plan:   Principal Problem:   Acute pancreatitis Active Problems:   Neuroendocrine tumor of pancreas  Acute pancreatitis and a history of patient with chronic pancreatitis -Has had a history of recurrent pancreatitis since his pancreatic surgery and was n.p.o. yesterday and placed on a clear liquid diet and was attempted to go to full liquid diet this morning but because patient had not tolerated clear liquid diet will cut back to clear liquid diet -We will continue supportive care and IV fluid hydration with lactated Ringer's at 125 MLS per hour and continue for now -Continue with antiemetics with ondansetron 4 mg p.o./IV every 6 as needed nausea -Lipase level went from 52 and trended down to 31 but patient still having pain -Continuing pantoprazole 40 mg p.o. twice daily -Continue with pain control with hydromorphone 1.5 mg every 3  hours as needed severe pain -CT scan of the abdomen pelvis showed "Mild acute uncomplicated pancreatitis involving the pancreatic head. No fluid collection, pseudocyst, or abscess. Postsurgical changes from distal pancreatectomy and splenectomy. Hepatic steatosis, with areas of focal fatty sparing near the gallbladder fossa. Aortic Atherosclerosis." -GI has been consulted for further evaluation recommendations and they ordered an MRI of the abdomen with and without contrast -MRI of the pancreas showed mild acute pancreatitis without evidence of complication and did show status post distal pancreatectomy with near complete resolution of prior surgical seroma along the surgical margins with no findings suspicious for recurrence.  There is no evidence of intrahepatic or extrahepatic ductal dilatation noted no cholelithiasis or choledocholithiasis on MRI but did show the patient has severe hepatic steatosis -Was unable to tolerate clear liquid diet but now tolerated yesterday evening so advance diet to full liquids today  History of neuroendocrine tumor of the tail of the pancreas -He is also had associated resection of the spleen in 2018 -Need to follow-up with oncology in outpatient setting -CT scan of abdomen pelvis as above did not show any evidence of any tumor recurrence  Abnormal ALT in the setting of Hepatic Steatosis -In the setting of pancreatitis -Patient's ALT trend showed ALT of 62 -> 52 -> 42 -> 50 -> 52 -Continue to monitor and trend and if necessary will obtain further imaging with a right upper quadrant ultrasound  Leukocytosis -Mild and likely reactive in the setting of pancreatitis -WBC is trended down and normalized as it went from 16.1 -> 14.2 -> 11.5 -> 10.3 -> 9.1 -  Continue to monitor for signs and symptoms of infection -Repeat CBC in a.m.  New onset Diabetes Mellitus Type 2 -Patient's hemoglobin A1c was 6.7 -Continue monitor CBGs carefully and if necessary will place on  sensitive NovoLog/scale insulin AC -He will benefit from low-dose metformin once he is at discharge or he can follow-up and discuss with PCP for further management -CBGs ranging from 81-123  Hyperlipidemia/elevated LDL -Patient is lipid panel done and showed a total cholesterol/HDL ratio of 4.7, cholesterol level 161, HDL 34, LDL 113, triglycerides of 71, and VLDL 14 -Immediate indication for treatment as an inpatient and can follow-up with the PCP in outpatient setting to be placed on statin  Thrombocytosis -Mild and likely reactive in the setting of above -Patient's platelet count went from 467 -> 469 -> 447 -> 459 and today is now 469 -Continue to monitor and trend and repeat CBC in a.m.  Obesity -Complicates overall prognosis and care -Estimated body mass index is 30.62 kg/m as calculated from the following:   Height as of this encounter: 6\' 3"  (1.905 m).   Weight as of this encounter: 111.1 kg.  -Weight Loss and Dietary Counseling given   DVT prophylaxis: Enoxaparin 40 mg subcu q. 24  Code Status: FULL CODE Family Communication: No family present at bedside  Disposition Plan: Pending further clinical improvement of his pancreatitis and tolerance of diet  Status is: Inpatient  Remains inpatient appropriate because: Will still need to tolerate p.o. intake and improvement of his pancreatitis  Consultants:  Gastroenterology   Procedures: MRI of the abdomen with and without contrast  Antimicrobials:  Anti-infectives (From admission, onward)    None        Subjective: Seen and examined at bedside and is still having quite a bit of pain but thinks is little bit better today.  Wanting to try advancing his diet so we will put him on a full liquid diet.  Mild abdomen done yesterday which showed mild acute pancreatitis.  No other concerns or complaints this time and his nausea is improving.  Objective: Vitals:   02/07/21 0355 02/07/21 1353 02/07/21 2100 02/08/21 0330  BP:  125/90 137/90 (!) 149/87 127/87  Pulse: 73 84 65 70  Resp: 20 20 20 20   Temp: 98 F (36.7 C) 98.2 F (36.8 C) 98.3 F (36.8 C) 97.6 F (36.4 C)  TempSrc: Oral  Oral Oral  SpO2: 92% 96% 97% 93%  Weight:      Height:       No intake or output data in the 24 hours ending 02/08/21 1341  Filed Weights   02/04/21 1434  Weight: 111.1 kg   Examination: Physical Exam:  Constitutional: WN/WD obese Caucasian male currently in no acute distress appears calm and a little uncomfortable but not as much as yesterday Eyes:  Lids and conjunctivae normal, sclerae anicteric  ENMT: External Ears, Nose appear normal. Grossly normal hearing.  Neck: Appears normal, supple, no cervical masses, normal ROM, no appreciable thyromegaly; no appreciable JVD Respiratory: Diminished to auscultation bilaterally, no wheezing, rales, rhonchi or crackles. Normal respiratory effort and patient is not tachypenic. No accessory muscle use.  Unlabored breathing Cardiovascular: RRR, no murmurs / rubs / gallops. S1 and S2 auscultated.  Has trace extremity edema Abdomen: Soft, mildly-tender, distended secondary body habitus. Bowel sounds positive.  GU: Deferred. Musculoskeletal: No clubbing / cyanosis of digits/nails. No joint deformity upper and lower extremities.  Skin: No rashes, lesions, ulcers on limited skin evaluation. No induration; Warm and dry.  Neurologic: CN 2-12 grossly intact with no focal deficits.  Romberg sign and cerebellar reflexes not assessed.  Psychiatric: Normal judgment and insight. Alert and oriented x 3. Normal mood and appropriate affect.   Data Reviewed: I have personally reviewed following labs and imaging studies  CBC: Recent Labs  Lab 02/04/21 1503 02/05/21 0501 02/06/21 0514 02/07/21 0529 02/08/21 0459  WBC 16.1* 14.2* 11.5* 10.3 9.1  NEUTROABS 10.7*  --  6.7 4.7 4.1  HGB 14.8 13.9 14.1 15.0 13.7  HCT 42.2 42.0 42.1 45.3 41.5  MCV 91.7 96.8 96.3 96.8 96.1  PLT 467* 469* 447* 459*  469*    Basic Metabolic Panel: Recent Labs  Lab 02/04/21 1503 02/05/21 0501 02/06/21 0514 02/07/21 0529 02/08/21 0459  NA 133* 135 136 136 136  K 3.9 4.0 4.2 3.9 3.8  CL 101 101 99 97* 97*  CO2 23 28 28 28 28   GLUCOSE 123* 97 81 84 81  BUN 12 10 11 12 11   CREATININE 0.95 0.86 0.96 1.10 1.10  CALCIUM 9.1 8.7* 8.9 9.4 9.0  MG  --   --  2.0 2.1 1.8  PHOS  --   --  4.0 3.3 3.4    GFR: Estimated Creatinine Clearance: 115.3 mL/min (by C-G formula based on SCr of 1.1 mg/dL). Liver Function Tests: Recent Labs  Lab 02/04/21 1503 02/05/21 0501 02/06/21 0514 02/07/21 0529 02/08/21 0459  AST 28 23 21  32 33  ALT 62* 52* 42 50* 52*  ALKPHOS 91 79 77 87 78  BILITOT 0.7 1.0 0.7 0.6 1.0  PROT 8.1 7.4 7.8 8.3* 7.5  ALBUMIN 4.2 4.1 4.0 4.4 3.9    Recent Labs  Lab 02/04/21 1503 02/06/21 0514  LIPASE 52* 31    No results for input(s): AMMONIA in the last 168 hours. Coagulation Profile: No results for input(s): INR, PROTIME in the last 168 hours. Cardiac Enzymes: No results for input(s): CKTOTAL, CKMB, CKMBINDEX, TROPONINI in the last 168 hours. BNP (last 3 results) No results for input(s): PROBNP in the last 8760 hours. HbA1C: No results for input(s): HGBA1C in the last 72 hours.  CBG: No results for input(s): GLUCAP in the last 168 hours. Lipid Profile: No results for input(s): CHOL, HDL, LDLCALC, TRIG, CHOLHDL, LDLDIRECT in the last 72 hours.  Thyroid Function Tests: No results for input(s): TSH, T4TOTAL, FREET4, T3FREE, THYROIDAB in the last 72 hours.  Anemia Panel: No results for input(s): VITAMINB12, FOLATE, FERRITIN, TIBC, IRON, RETICCTPCT in the last 72 hours. Sepsis Labs: No results for input(s): PROCALCITON, LATICACIDVEN in the last 168 hours.  Recent Results (from the past 240 hour(s))  Resp Panel by RT-PCR (Flu A&B, Covid) Nasopharyngeal Swab     Status: None   Collection Time: 02/04/21  7:06 PM   Specimen: Nasopharyngeal Swab; Nasopharyngeal(NP) swabs  in vial transport medium  Result Value Ref Range Status   SARS Coronavirus 2 by RT PCR NEGATIVE NEGATIVE Final    Comment: (NOTE) SARS-CoV-2 target nucleic acids are NOT DETECTED.  The SARS-CoV-2 RNA is generally detectable in upper respiratory specimens during the acute phase of infection. The lowest concentration of SARS-CoV-2 viral copies this assay can detect is 138 copies/mL. A negative result does not preclude SARS-Cov-2 infection and should not be used as the sole basis for treatment or other patient management decisions. A negative result may occur with  improper specimen collection/handling, submission of specimen other than nasopharyngeal swab, presence of viral mutation(s) within the areas targeted by this assay, and inadequate number  of viral copies(<138 copies/mL). A negative result must be combined with clinical observations, patient history, and epidemiological information. The expected result is Negative.  Fact Sheet for Patients:  EntrepreneurPulse.com.au  Fact Sheet for Healthcare Providers:  IncredibleEmployment.be  This test is no t yet approved or cleared by the [place] FDA and  has been authorized for detection and/or diagnosis of SARS-CoV-2 by FDA under an Emergency Use Authorization (EUA). This EUA will remain  in effect (meaning this test can be used) for the duration of the COVID-19 declaration under Section 564(b)(1) of the Act, 21 U.S.C.section 360bbb-3(b)(1), unless the authorization is terminated  or revoked sooner.       Influenza A by PCR NEGATIVE NEGATIVE Final   Influenza B by PCR NEGATIVE NEGATIVE Final    Comment: (NOTE) The Xpert Xpress SARS-CoV-2/FLU/RSV plus assay is intended as an aid in the diagnosis of influenza from Nasopharyngeal swab specimens and should not be used as a sole basis for treatment. Nasal washings and aspirates are unacceptable for Xpert Xpress  SARS-CoV-2/FLU/RSV testing.  Fact Sheet for Patients: EntrepreneurPulse.com.au  Fact Sheet for Healthcare Providers: IncredibleEmployment.be  This test is not yet approved or cleared by the [place] FDA and has been authorized for detection and/or diagnosis of SARS-CoV-2 by FDA under an Emergency Use Authorization (EUA). This EUA will remain in effect (meaning this test can be used) for the duration of the COVID-19 declaration under Section 564(b)(1) of the Act, 21 U.S.C. section 360bbb-3(b)(1), unless the authorization is terminated or revoked.  Performed at [facility], [address] [place], [place] [address]      RN Pressure Injury Documentation:     Estimated body mass index is 30.62 kg/m as calculated from the following:   Height as of this encounter: 6\' 3"  (1.905 m).   Weight as of this encounter: 111.1 kg.  Malnutrition Type:   Malnutrition Characteristics:   Nutrition Interventions:    Radiology Studies: MR ABDOMEN W WO CONTRAST  Result Date: 02/07/2021 CLINICAL DATA:  Recurrent pancreatitis. History of pancreatic neuroendocrine tumor. EXAM: MRI ABDOMEN WITHOUT AND WITH CONTRAST TECHNIQUE: Multiplanar multisequence MR imaging of the abdomen was performed both before and after the administration of intravenous contrast. CONTRAST:  25mL GADAVIST GADOBUTROL 1 MMOL/ML IV SOLN COMPARISON:  CT abdomen/pelvis dated 02/04/2021. MRI abdomen dated 06/25/2018. FINDINGS: Lower chest: Lung bases are clear. Hepatobiliary: Severe hepatic steatosis. Liver is otherwise within normal limits. No suspicious/enhancing hepatic lesions. Gallbladder is unremarkable. No intrahepatic or extrahepatic ductal dilatation. Common duct measures 3 mm. No choledocholithiasis is seen. Pancreas: Status post distal pancreatectomy. Tiny postsurgical seroma along the surgical margin (series 4/image 17), measuring 11 mm, improved from prior MRI  (previously 3.9 cm). Peripancreatic inflammatory changes along the pancreatic head/uncinate process and pancreaticoduodenal groove (series 4/image 23), reflecting acute pancreatitis. No drainable fluid collection/walled-off necrosis. Spleen:  Surgically absent. Adrenals/Urinary Tract:  Adrenal glands are within normal limits. Subcentimeter right upper pole renal cyst. Left kidney is within normal limits. No hydronephrosis. Stomach/Bowel: Stomach is within normal limits. Visualized bowel is unremarkable. Vascular/Lymphatic:  No evidence of abdominal aortic aneurysm. Small upper abdominal lymph nodes measuring up to 9 mm short axis, likely reactive. Other:  No abdominal ascites. Musculoskeletal: No focal osseous lesions. IMPRESSION: Mild acute pancreatitis, without evidence of complication. Status post distal pancreatectomy. Near complete resolution of prior postsurgical seroma along the surgical margin. No findings suspicious for recurrence. No intrahepatic or extrahepatic ductal dilatation. No cholelithiasis or choledocholithiasis on MR. Severe hepatic steatosis.  Electronically Signed   By: Julian Hy M.D.   On: 02/07/2021 23:06    Scheduled Meds:  enoxaparin (LOVENOX) injection  40 mg Subcutaneous Q24H   pantoprazole  40 mg Oral BID   Continuous Infusions:  lactated ringers 125 mL/hr at 02/08/21 7014    LOS: 3 days   Kerney Elbe, DO [facility] PAGER is on Peter  If 7PM-7AM, please contact night-coverage [email or web address]

## 2021-02-08 NOTE — Progress Notes (Addendum)
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 Barry Dienes in October 2019      # Recurrent acute mild pancreatitis. This seems to be at least his second documented episode since distal pancreatectomy in Oct 2019. First episode was in April 2020 at which time MRI suggested mild pancreatitis. Etiology unclear, doesn't have usual risk factors.   --Patient does not drink Etoh. .  --Not on any medications commonly associated with pancreatitis.  --Liver chemistries okay except for trivial elevation in ALT in setting of fatty liver.  --Normal serum Ca+, normal Triglycerides --He feels better today though still requiring around the clock pain medications. Starting full liquids today, will see how he does. If tolerates then can decrease or stop IV fluids      SUBJECTIVE   Still having abdominal pain and nausea. Pain medication helps control pain.   OBJECTIVE     Scheduled inpatient medications:   enoxaparin (LOVENOX) injection  40 mg Subcutaneous Q24H   pantoprazole  40 mg Oral BID   Continuous inpatient infusions:   lactated ringers 125 mL/hr at 02/08/21 0627   PRN inpatient medications: acetaminophen **OR** acetaminophen, alum & mag hydroxide-simeth, HYDROmorphone (DILAUDID) injection, ondansetron **OR** ondansetron (ZOFRAN) IV, polyvinyl alcohol  Vital signs in last 24 hours: Temp:  [97.6 F (36.4 C)-98.3 F (36.8 C)] 97.6 F (36.4 C) (12/15 0330) Pulse Rate:  [65-84] 70 (12/15 0330) Resp:  [20] 20 (12/15 0330) BP: (127-149)/(87-90) 127/87 (12/15 0330) SpO2:  [93 %-97 %] 93 % (12/15 0330) Last BM Date: 01/31/21 No intake or output data in the 24 hours ending 02/08/21 0937   Physical Exam:  General: Alert male in NAD Heart:  Regular rate and rhythm. No lower extremity edema Pulmonary: Normal  respiratory effort Abdomen: Soft, nondistended, nontender. Normal bowel sounds.  Neurologic: Alert and oriented Psych: Pleasant. Cooperative.   Filed Weights   02/04/21 1434  Weight: 111.1 kg    Intake/Output from previous day: No intake/output data recorded. Intake/Output this shift: No intake/output data recorded.   Lab Results: Recent Labs    02/06/21 0514 02/07/21 0529 02/08/21 0459  WBC 11.5* 10.3 9.1  HGB 14.1 15.0 13.7  HCT 42.1 45.3 41.5  PLT 447* 459* 469*   BMET Recent Labs    02/06/21 0514 02/07/21 0529 02/08/21 0459  NA 136 136 136  K 4.2 3.9 3.8  CL 99 97* 97*  CO2 28 28 28   GLUCOSE 81 84 81  BUN 11 12 11   CREATININE 0.96 1.10 1.10  CALCIUM 8.9 9.4 9.0   LFT Recent Labs    02/08/21 0459  PROT 7.5  ALBUMIN 3.9  AST 33  ALT 52*  ALKPHOS 78  BILITOT 1.0   PT/INR No results for input(s): LABPROT, INR in the last 72 hours. Hepatitis Panel No results for input(s): HEPBSAG, HCVAB, HEPAIGM, HEPBIGM in the last 72 hours.  MR ABDOMEN W WO CONTRAST  Result Date: 02/07/2021 CLINICAL DATA:  Recurrent pancreatitis. History of pancreatic neuroendocrine tumor. EXAM: MRI ABDOMEN WITHOUT AND WITH CONTRAST TECHNIQUE: Multiplanar multisequence MR imaging of the abdomen was performed both before and after the administration of intravenous contrast. CONTRAST:  3mL GADAVIST GADOBUTROL 1 MMOL/ML IV SOLN COMPARISON:  CT abdomen/pelvis dated 02/04/2021. MRI abdomen dated 06/25/2018. FINDINGS: Lower chest: Lung bases are  clear. Hepatobiliary: Severe hepatic steatosis. Liver is otherwise within normal limits. No suspicious/enhancing hepatic lesions. Gallbladder is unremarkable. No intrahepatic or extrahepatic ductal dilatation. Common duct measures 3 mm. No choledocholithiasis is seen. Pancreas: Status post distal pancreatectomy. Tiny postsurgical seroma along the surgical margin (series 4/image 17), measuring 11 mm, improved from prior MRI (previously 3.9 cm).  Peripancreatic inflammatory changes along the pancreatic head/uncinate process and pancreaticoduodenal groove (series 4/image 23), reflecting acute pancreatitis. No drainable fluid collection/walled-off necrosis. Spleen:  Surgically absent. Adrenals/Urinary Tract:  Adrenal glands are within normal limits. Subcentimeter right upper pole renal cyst. Left kidney is within normal limits. No hydronephrosis. Stomach/Bowel: Stomach is within normal limits. Visualized bowel is unremarkable. Vascular/Lymphatic:  No evidence of abdominal aortic aneurysm. Small upper abdominal lymph nodes measuring up to 9 mm short axis, likely reactive. Other:  No abdominal ascites. Musculoskeletal: No focal osseous lesions. IMPRESSION: Mild acute pancreatitis, without evidence of complication. Status post distal pancreatectomy. Near complete resolution of prior postsurgical seroma along the surgical margin. No findings suspicious for recurrence. No intrahepatic or extrahepatic ductal dilatation. No cholelithiasis or choledocholithiasis on MR. Severe hepatic steatosis. Electronically Signed   By: Julian Hy M.D.   On: 02/07/2021 23:06      LOS: 3 days   Tye Savoy ,NP 02/08/2021, 9:37 AM   ________________________________________________________________________  Velora Heckler GI MD note:  I personally examined the patient, reviewed the data and agree with the assessment and plan described above.  MRI showed no sign of recurrent tumor.  He is slowly recovering from mild acute pancreatitis (by imaging).  Unclear etiology.  I am going to order IgG4 level to check for possible autoimmune pancreatitis. We may end up referring him to one of the nearby tertiary med centers as an outpatient to consider other options for his recurrent AP. I am ordering heart healthy diet now, if he tolerates dinner and breakfast then can HL his IV.   Owens Loffler, MD [facility] Gastroenterology Pager [phone]

## 2021-02-08 NOTE — Clinical Social Work Note (Signed)
°  Transition of Care [facility]) Screening Note   Patient Details  Name: BRYKER FLETCHALL Date of Birth: 30-Jan-1976   Transition of Care [facility]) CM/SW Contact:    Trish Mage, LCSW Phone Number: 02/08/2021, 8:41 AM    Transition of Care Department [facility]) has reviewed patient and no TOC needs have been identified at this time. We will continue to monitor patient advancement through interdisciplinary progression rounds. If new patient transition needs arise, please place a TOC consult.

## 2021-02-09 DIAGNOSIS — R7989 Other specified abnormal findings of blood chemistry: Secondary | ICD-10-CM

## 2021-02-09 DIAGNOSIS — K5900 Constipation, unspecified: Secondary | ICD-10-CM

## 2021-02-09 LAB — COMPREHENSIVE METABOLIC PANEL
ALT: 69 U/L — ABNORMAL HIGH (ref 0–44)
AST: 46 U/L — ABNORMAL HIGH (ref 15–41)
Albumin: 3.8 g/dL (ref 3.5–5.0)
Alkaline Phosphatase: 78 U/L (ref 38–126)
Anion gap: 6 (ref 5–15)
BUN: 12 mg/dL (ref 6–20)
CO2: 30 mmol/L (ref 22–32)
Calcium: 8.7 mg/dL — ABNORMAL LOW (ref 8.9–10.3)
Chloride: 101 mmol/L (ref 98–111)
Creatinine, Ser: 1.05 mg/dL (ref 0.61–1.24)
GFR, Estimated: >60 mL/min (ref >60)
Glucose, Bld: 100 mg/dL — ABNORMAL HIGH (ref 70–99)
Potassium: 4 mmol/L (ref 3.5–5.1)
Sodium: 137 mmol/L (ref 135–145)
Total Bilirubin: 0.6 mg/dL (ref 0.3–1.2)
Total Protein: 7.4 g/dL (ref 6.5–8.1)

## 2021-02-09 LAB — CBC WITH DIFFERENTIAL/PLATELET
Abs Immature Granulocytes: 0.03 10*3/uL (ref 0.00–0.07)
Basophils Absolute: 0.1 10*3/uL (ref 0.0–0.1)
Basophils Relative: 1 %
Eosinophils Absolute: 0.4 10*3/uL (ref 0.0–0.5)
Eosinophils Relative: 6 %
HCT: 43.2 % (ref 39.0–52.0)
Hemoglobin: 14.5 g/dL (ref 13.0–17.0)
Immature Granulocytes: 0 %
Lymphocytes Relative: 33 %
Lymphs Abs: 2.4 10*3/uL (ref 0.7–4.0)
MCH: 32.2 pg (ref 26.0–34.0)
MCHC: 33.6 g/dL (ref 30.0–36.0)
MCV: 96 fL (ref 80.0–100.0)
Monocytes Absolute: 1.2 10*3/uL — ABNORMAL HIGH (ref 0.1–1.0)
Monocytes Relative: 16 %
Neutro Abs: 3.3 10*3/uL (ref 1.7–7.7)
Neutrophils Relative %: 44 %
Platelets: 462 10*3/uL — ABNORMAL HIGH (ref 150–400)
RBC: 4.5 MIL/uL (ref 4.22–5.81)
RDW: 13.2 % (ref 11.5–15.5)
WBC: 7.4 10*3/uL (ref 4.0–10.5)
nRBC: 0 % (ref 0.0–0.2)

## 2021-02-09 LAB — MAGNESIUM: Magnesium: 2.1 mg/dL (ref 1.7–2.4)

## 2021-02-09 LAB — PHOSPHORUS: Phosphorus: 3.7 mg/dL (ref 2.5–4.6)

## 2021-02-09 MED ORDER — BISACODYL 10 MG RE SUPP: 10.0000 mg | Freq: Every day | RECTAL | Status: DC | PRN

## 2021-02-09 MED ORDER — POLYETHYLENE GLYCOL 3350 17 G PO PACK
17.0000 g | PACK | Freq: Two times a day (BID) | ORAL | Status: DC
  Administered 2021-02-09 – 2021-02-11 (×6): 17 g via ORAL
  Filled 2021-02-09 (×7): qty 1

## 2021-02-09 MED ORDER — SENNOSIDES-DOCUSATE SODIUM 8.6-50 MG PO TABS
1.0000 | ORAL_TABLET | Freq: Two times a day (BID) | ORAL | Status: DC
  Administered 2021-02-09 – 2021-02-11 (×5): 1 via ORAL
  Filled 2021-02-09 (×5): qty 1

## 2021-02-09 NOTE — Progress Notes (Addendum)
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 Barry Dienes in October 2019      # Recurrent acute mild pancreatitis. This seems to be at least his second documented episode since distal pancreatectomy in Oct 2019. First episode was in April 2020 at which time MRI suggested mild pancreatitis.  Etiology unclear, doesn't have usual risk factors.   --Patient does not drink Etoh. .  --Liver chemistries okay except for trivial elevation in ALT in setting of fatty liver.  --Normal serum Ca+, normal Triglycerides --Not on any medications commonly associated with pancreatitis.   - Still pending IgG4 at this time -Patient still may benefit from referral to tertiary med center as outpatient, especially if this IgG4 is negative. -Continue heart healthy diet for now, do small frequent meals -Can potentially decrease or stop IV fluids. -Patient encouraged to ambulate to facilitate with bowel movement -Patient is on Colace, can consider adding MiraLAX.      SUBJECTIVE   Still having abdominal pain and nausea. Pain medication helps control pain every 2-3 hours. States tylenol helps pain.  Patient's not had a bowel movement since Friday Patient started on heart healthy diet yesterday, had dinner and breakfast, some nausea and worsening pain with this.  OBJECTIVE     Scheduled inpatient medications:   enoxaparin (LOVENOX) injection  40 mg Subcutaneous Q24H   pantoprazole  40 mg Oral BID   Continuous inpatient infusions:   lactated ringers 125 mL/hr at 02/08/21 2101   PRN inpatient medications: acetaminophen **OR** acetaminophen, alum & mag hydroxide-simeth, docusate sodium, HYDROmorphone (DILAUDID) injection, ondansetron **OR** ondansetron (ZOFRAN) IV, polyvinyl alcohol  Vital signs in last 24 hours: Temp:  [97.3 F  (36.3 C)-98.2 F (36.8 C)] 98 F (36.7 C) (12/16 0359) Pulse Rate:  [69-74] 74 (12/16 0359) Resp:  [16-20] 18 (12/16 0359) BP: (121-135)/(81-86) 125/81 (12/16 0359) SpO2:  [93 %-96 %] 93 % (12/16 0359) Last BM Date: 01/31/21  Intake/Output Summary (Last 24 hours) at 02/09/2021 1045 Last data filed at 02/08/2021 2200 Gross per 24 hour  Intake 366.64 ml  Output --  Net 366.64 ml     Physical Exam:  General: Alert male in NAD Heart:  Regular rate and rhythm. No lower extremity edema Pulmonary: Normal respiratory effort Abdomen: Soft, nondistended, nontender. Normal bowel sounds.  Neurologic: Alert and oriented Psych: Pleasant. Cooperative.   Filed Weights   02/04/21 1434  Weight: 111.1 kg    Intake/Output from previous day: 12/15 0701 - 12/16 0700 In: 366.6 [I.V.:366.6] Out: -  Intake/Output this shift: No intake/output data recorded.   Lab Results: Recent Labs    02/07/21 0529 02/08/21 0459 02/09/21 0521  WBC 10.3 9.1 7.4  HGB 15.0 13.7 14.5  HCT 45.3 41.5 43.2  PLT 459* 469* 462*   BMET Recent Labs    02/07/21 0529 02/08/21 0459 02/09/21 0521  NA 136 136 137  K 3.9 3.8 4.0  CL 97* 97* 101  CO2 28 28 30   GLUCOSE 84 81 100*  BUN 12 11 12   CREATININE 1.10 1.10 1.05  CALCIUM 9.4 9.0 8.7*   LFT Recent Labs    02/09/21 0521  PROT 7.4  ALBUMIN 3.8  AST 46*  ALT 69*  ALKPHOS 78  BILITOT 0.6     LOS: 4 days  Vladimir Crofts ,NP 02/09/2021, 10:45 AM   __ ________________________________________________________________________  Velora Heckler GI MD note:  I personally examined the patient, reviewed the data and agree with the assessment and plan described above.  He is slowly recovering from mild acute pancreatitis of unclear etiology.  No sign of pancreatic tumor recurrence on MR, no sign of gallstones on MR, does not drink etoh, no obvious offending meds. Awaiting IgG 4 level.  He is tolerating solid food, needs to ambulate. Pain is slowly  improving.  Will follow along.   Owens Loffler, MD [facility] Pager [phone]

## 2021-02-09 NOTE — Progress Notes (Signed)
PROGRESS NOTE    Andre Simmons  VZC:[id number] DOB: 1976-01-29 DOA: 02/04/2021 PCP: Maryella Shivers, MD  Brief Narrative:  The patient is a 45 year old obese Caucasian male with a past medical history significant for but #2 neuroendocrine pancreatic tumor status postresection of the tail of the pancreas, history of splenectomy 2018 as well as recurrent pancreatitis presents with 1 week history of abdominal pain.  He is complained of epigastric pain similar to episodes of pancreatitis.  He had nausea vomiting with decreased p.o. intake.  Lipase was elevated 52.  CT of the abdomen pelvis was done and showed mild acute pancreatitis.  He was admitted for further management of his acute on chronic pancreatitis and GIs been consulted.  He remained on a clear liquid diet yesterday with no real improvement so GI was consulted and they ordered an MRCP MRI of the pancreas MRI which showed mild acute pancreatitis.  He is feeling little bit better today and continues to take round-the-clock pain medication.  His diet was advanced to full liquid diet yesterday and GI advance it to a heart healthy diet yesterday however he continues to have significant nausea and pain today.  GI ordered an IgG4 and they feel that the patient may benefit from referral to a tertiary med center as an outpatient.  They are recommending continue heart healthy diet for now and do small frequent meals and patient was encouraged to ambulate.  Patient has not had a bowel movement since Friday so he was initiated on bowel regimen started on MiraLAX and will change his other laxative to senna docusate.  Assessment & Plan:   Principal Problem:   Acute pancreatitis Active Problems:   Neuroendocrine tumor of pancreas  Acute pancreatitis and a history of patient with chronic pancreatitis -Has had a history of recurrent pancreatitis since his pancreatic surgery and was n.p.o. yesterday and placed on a clear liquid diet and was attempted to  go to full liquid diet this morning but because patient had not tolerated clear liquid diet will cut back to clear liquid diet -We will continue supportive care and IV fluid hydration with lactated Ringer's at 125 MLS per hour and continue for now -Continue with antiemetics with ondansetron 4 mg p.o./IV every 6 as needed nausea -Lipase level went from 52 and trended down to 31 but patient still having pain and nausea which is persistent -Continuing pantoprazole 40 mg p.o. twice daily -Continue with pain control with hydromorphone 1.5 mg every 3 hours as needed severe pain -CT scan of the abdomen pelvis showed "Mild acute uncomplicated pancreatitis involving the pancreatic head. No fluid collection, pseudocyst, or abscess. Postsurgical changes from distal pancreatectomy and splenectomy. Hepatic steatosis, with areas of focal fatty sparing near the gallbladder fossa. Aortic Atherosclerosis." -GI has been consulted for further evaluation recommendations and they ordered an MRI of the abdomen with and without contrast -MRI of the pancreas showed mild acute pancreatitis without evidence of complication and did show status post distal pancreatectomy with near complete resolution of prior surgical seroma along the surgical margins with no findings suspicious for recurrence.  There is no evidence of intrahepatic or extrahepatic ductal dilatation noted no cholelithiasis or choledocholithiasis on MRI but did show the patient has severe hepatic steatosis -Patient's diet was advanced to full liquid diet yesterday and then advance to heart healthy diet by GI however patient continues to have some nausea and worsening pain with his meals so he was told to have small frequent meals -GI has ordered an  IgG4 which is pending and they feel that they patient would benefit from an outpatient referral to a tertiary care center  History of neuroendocrine tumor of the tail of the pancreas -He is also had associated resection  of the spleen in 2018 -Need to follow-up with oncology in outpatient setting -CT scan of abdomen pelvis as above did not show any evidence of any tumor recurrence  Abnormal LFTs n the setting of Hepatic Steatosis -In the setting of pancreatitis -Patient's ALT trend showed ALT of 62 -> 52 -> 42 -> 50 -> 52 and trended up to 69 -Patient's AST is now worsened and gone from 21 is now 46 -Continue to monitor and trend and if necessary will obtain further imaging with a right upper quadrant ultrasound\ -GI is following and defer to them for further work-up  Leukocytosis -Mild and likely reactive in the setting of pancreatitis -WBC is trended down and normalized as it went from 16.1 -> 14.2 -> 11.5 -> 10.3 -> 9.1 and is now normalized at 7.4 -Continue to monitor for signs and symptoms of infection -Repeat CBC in a.m.  New onset Diabetes Mellitus Type 2 -Patient's hemoglobin A1c was 6.7 -Continue monitor CBGs carefully and if necessary will place on sensitive NovoLog/scale insulin AC -He will benefit from low-dose metformin once he is at discharge or he can follow-up and discuss with PCP for further management -CBGs ranging from 81-123; blood sugar on a.m. CMP was 100  Hyperlipidemia/elevated LDL -Patient is lipid panel done and showed a total cholesterol/HDL ratio of 4.7, cholesterol level 161, HDL 34, LDL 113, triglycerides of 71, and VLDL 14 -Immediate indication for treatment as an inpatient and can follow-up with the PCP in outpatient setting to be placed on statin  Constipation -Added MiraLAX 17 g p.o. twice daily and will change his docusate sodium 100 mg daily as needed to senna docusate scheduled 1 tab p.o. twice daily and also add a bisacodyl suppository 10 mg rectally daily as needed  Thrombocytosis -Mild and likely reactive in the setting of above -Patient's platelet count went from 467 -> 469 -> 447 -> 459 -> 469 -> 462 -Continue to monitor and trend and repeat CBC in  a.m.  Obesity -Complicates overall prognosis and care -Estimated body mass index is 30.62 kg/m as calculated from the following:   Height as of this encounter: 6\' 3"  (1.905 m).   Weight as of this encounter: 111.1 kg.  -Weight Loss and Dietary Counseling given   DVT prophylaxis: Enoxaparin 40 mg subcu q. 24  Code Status: FULL CODE Family Communication: No family present at bedside  Disposition Plan: Pending further clinical improvement of his pancreatitis and tolerance of diet and clearance by GI  Status is: Inpatient  Remains inpatient appropriate because: Will still need to tolerate p.o. intake and improvement of his pancreatitis  Consultants:  Gastroenterology   Procedures: MRI of the abdomen with and without contrast  Antimicrobials:  Anti-infectives (From admission, onward)    None        Subjective: Seen and examined at bedside and was still having quite a bit of nausea and abdominal pain.  Diet was advanced from a full liquid diet to heart healthy diet by GI yesterday but patient continues have significant pain and nausea with this.  He has been taking around-the-clock analgesics with the narcotic medications.  Patient states that he normally takes 5 frequent small meals daily but stating that the diet here is thrown him off.  Continues to have  significant nausea and the pain but states is not worse than yesterday but is not improved.  No other concerns or complaints at this time.  Objective: Vitals:   02/08/21 0330 02/08/21 1500 02/08/21 2023 02/09/21 0359  BP: 127/87 135/82 121/86 125/81  Pulse: 70 72 69 74  Resp: 20 20 16 18   Temp: 97.6 F (36.4 C) 98.2 F (36.8 C) (!) 97.3 F (36.3 C) 98 F (36.7 C)  TempSrc: Oral Oral Oral   SpO2: 93% 94% 96% 93%  Weight:      Height:        Intake/Output Summary (Last 24 hours) at 02/09/2021 1209 Last data filed at 02/08/2021 2200 Gross per 24 hour  Intake 366.64 ml  Output --  Net 366.64 ml    Filed Weights    02/04/21 1434  Weight: 111.1 kg   Examination: Physical Exam:  Constitutional: WN/WD Caucasian male in no acute distress appears calm but slightly uncomfortable Eyes: Lids and conjunctivae normal, sclerae anicteric  ENMT: External Ears, Nose appear normal. Grossly normal hearing. Mucous membranes are moist.  Neck: Appears normal, supple, no cervical masses, normal ROM, no appreciable thyromegaly: Appreciable JVD Respiratory: Diminished to auscultation bilaterally, no wheezing, rales, rhonchi or crackles. Normal respiratory effort and patient is not tachypenic. No accessory muscle use.  Unlabored breathing Cardiovascular: RRR, no murmurs / rubs / gallops. S1 and S2 auscultated.  Trace extremity edema Abdomen: Soft, mildly-tender, distended secondary to body habitus. Bowel sounds positive.  GU: Deferred. Musculoskeletal: No clubbing / cyanosis of digits/nails. No joint deformity upper and lower extremities.  Skin: No rashes, lesions, ulcers on limited skin evaluation. No induration; Warm and dry.  Neurologic: CN 2-12 grossly intact with no focal deficits.  Romberg sign and cerebellar reflexes not assessed.  Psychiatric: Normal judgment and insight. Alert and oriented x 3. Normal mood and appropriate affect.   Data Reviewed: I have personally reviewed following labs and imaging studies  CBC: Recent Labs  Lab 02/04/21 1503 02/05/21 0501 02/06/21 0514 02/07/21 0529 02/08/21 0459 02/09/21 0521  WBC 16.1* 14.2* 11.5* 10.3 9.1 7.4  NEUTROABS 10.7*  --  6.7 4.7 4.1 3.3  HGB 14.8 13.9 14.1 15.0 13.7 14.5  HCT 42.2 42.0 42.1 45.3 41.5 43.2  MCV 91.7 96.8 96.3 96.8 96.1 96.0  PLT 467* 469* 447* 459* 469* 462*    Basic Metabolic Panel: Recent Labs  Lab 02/05/21 0501 02/06/21 0514 02/07/21 0529 02/08/21 0459 02/09/21 0521  NA 135 136 136 136 137  K 4.0 4.2 3.9 3.8 4.0  CL 101 99 97* 97* 101  CO2 28 28 28 28 30   GLUCOSE 97 81 84 81 100*  BUN 10 11 12 11 12   CREATININE 0.86 0.96  1.10 1.10 1.05  CALCIUM 8.7* 8.9 9.4 9.0 8.7*  MG  --  2.0 2.1 1.8 2.1  PHOS  --  4.0 3.3 3.4 3.7    GFR: Estimated Creatinine Clearance: 120.8 mL/min (by C-G formula based on SCr of 1.05 mg/dL). Liver Function Tests: Recent Labs  Lab 02/05/21 0501 02/06/21 0514 02/07/21 0529 02/08/21 0459 02/09/21 0521  AST 23 21 32 33 46*  ALT 52* 42 50* 52* 69*  ALKPHOS 79 77 87 78 78  BILITOT 1.0 0.7 0.6 1.0 0.6  PROT 7.4 7.8 8.3* 7.5 7.4  ALBUMIN 4.1 4.0 4.4 3.9 3.8    Recent Labs  Lab 02/04/21 1503 02/06/21 0514  LIPASE 52* 31    No results for input(s): AMMONIA in the last 168 hours.  Coagulation Profile: No results for input(s): INR, PROTIME in the last 168 hours. Cardiac Enzymes: No results for input(s): CKTOTAL, CKMB, CKMBINDEX, TROPONINI in the last 168 hours. BNP (last 3 results) No results for input(s): PROBNP in the last 8760 hours. HbA1C: No results for input(s): HGBA1C in the last 72 hours.  CBG: No results for input(s): GLUCAP in the last 168 hours. Lipid Profile: No results for input(s): CHOL, HDL, LDLCALC, TRIG, CHOLHDL, LDLDIRECT in the last 72 hours.  Thyroid Function Tests: No results for input(s): TSH, T4TOTAL, FREET4, T3FREE, THYROIDAB in the last 72 hours.  Anemia Panel: No results for input(s): VITAMINB12, FOLATE, FERRITIN, TIBC, IRON, RETICCTPCT in the last 72 hours. Sepsis Labs: No results for input(s): PROCALCITON, LATICACIDVEN in the last 168 hours.  Recent Results (from the past 240 hour(s))  Resp Panel by RT-PCR (Flu A&B, Covid) Nasopharyngeal Swab     Status: None   Collection Time: 02/04/21  7:06 PM   Specimen: Nasopharyngeal Swab; Nasopharyngeal(NP) swabs in vial transport medium  Result Value Ref Range Status   SARS Coronavirus 2 by RT PCR NEGATIVE NEGATIVE Final    Comment: (NOTE) SARS-CoV-2 target nucleic acids are NOT DETECTED.  The SARS-CoV-2 RNA is generally detectable in upper respiratory specimens during the acute phase of  infection. The lowest concentration of SARS-CoV-2 viral copies this assay can detect is 138 copies/mL. A negative result does not preclude SARS-Cov-2 infection and should not be used as the sole basis for treatment or other patient management decisions. A negative result may occur with  improper specimen collection/handling, submission of specimen other than nasopharyngeal swab, presence of viral mutation(s) within the areas targeted by this assay, and inadequate number of viral copies(<138 copies/mL). A negative result must be combined with clinical observations, patient history, and epidemiological information. The expected result is Negative.  Fact Sheet for Patients:  EntrepreneurPulse.com.au  Fact Sheet for Healthcare Providers:  IncredibleEmployment.be  This test is no t yet approved or cleared by the [place] FDA and  has been authorized for detection and/or diagnosis of SARS-CoV-2 by FDA under an Emergency Use Authorization (EUA). This EUA will remain  in effect (meaning this test can be used) for the duration of the COVID-19 declaration under Section 564(b)(1) of the Act, 21 U.S.C.section 360bbb-3(b)(1), unless the authorization is terminated  or revoked sooner.       Influenza A by PCR NEGATIVE NEGATIVE Final   Influenza B by PCR NEGATIVE NEGATIVE Final    Comment: (NOTE) The Xpert Xpress SARS-CoV-2/FLU/RSV plus assay is intended as an aid in the diagnosis of influenza from Nasopharyngeal swab specimens and should not be used as a sole basis for treatment. Nasal washings and aspirates are unacceptable for Xpert Xpress SARS-CoV-2/FLU/RSV testing.  Fact Sheet for Patients: EntrepreneurPulse.com.au  Fact Sheet for Healthcare Providers: IncredibleEmployment.be  This test is not yet approved or cleared by the [place] FDA and has been authorized for detection and/or diagnosis of SARS-CoV-2  by FDA under an Emergency Use Authorization (EUA). This EUA will remain in effect (meaning this test can be used) for the duration of the COVID-19 declaration under Section 564(b)(1) of the Act, 21 U.S.C. section 360bbb-3(b)(1), unless the authorization is terminated or revoked.  Performed at [facility], [address] [place], [place] [address]     RN Pressure Injury Documentation:     Estimated body mass index is 30.62 kg/m as calculated from the following:   Height as of this encounter: 6\' 3"  (1.905  m).   Weight as of this encounter: 111.1 kg.  Malnutrition Type:   Malnutrition Characteristics:   Nutrition Interventions:    Radiology Studies: MR ABDOMEN W WO CONTRAST  Result Date: 02/07/2021 CLINICAL DATA:  Recurrent pancreatitis. History of pancreatic neuroendocrine tumor. EXAM: MRI ABDOMEN WITHOUT AND WITH CONTRAST TECHNIQUE: Multiplanar multisequence MR imaging of the abdomen was performed both before and after the administration of intravenous contrast. CONTRAST:  47mL GADAVIST GADOBUTROL 1 MMOL/ML IV SOLN COMPARISON:  CT abdomen/pelvis dated 02/04/2021. MRI abdomen dated 06/25/2018. FINDINGS: Lower chest: Lung bases are clear. Hepatobiliary: Severe hepatic steatosis. Liver is otherwise within normal limits. No suspicious/enhancing hepatic lesions. Gallbladder is unremarkable. No intrahepatic or extrahepatic ductal dilatation. Common duct measures 3 mm. No choledocholithiasis is seen. Pancreas: Status post distal pancreatectomy. Tiny postsurgical seroma along the surgical margin (series 4/image 17), measuring 11 mm, improved from prior MRI (previously 3.9 cm). Peripancreatic inflammatory changes along the pancreatic head/uncinate process and pancreaticoduodenal groove (series 4/image 23), reflecting acute pancreatitis. No drainable fluid collection/walled-off necrosis. Spleen:  Surgically absent. Adrenals/Urinary Tract:  Adrenal glands are within normal limits.  Subcentimeter right upper pole renal cyst. Left kidney is within normal limits. No hydronephrosis. Stomach/Bowel: Stomach is within normal limits. Visualized bowel is unremarkable. Vascular/Lymphatic:  No evidence of abdominal aortic aneurysm. Small upper abdominal lymph nodes measuring up to 9 mm short axis, likely reactive. Other:  No abdominal ascites. Musculoskeletal: No focal osseous lesions. IMPRESSION: Mild acute pancreatitis, without evidence of complication. Status post distal pancreatectomy. Near complete resolution of prior postsurgical seroma along the surgical margin. No findings suspicious for recurrence. No intrahepatic or extrahepatic ductal dilatation. No cholelithiasis or choledocholithiasis on MR. Severe hepatic steatosis. Electronically Signed   By: Julian Hy M.D.   On: 02/07/2021 23:06    Scheduled Meds:  enoxaparin (LOVENOX) injection  40 mg Subcutaneous Q24H   pantoprazole  40 mg Oral BID   polyethylene glycol  17 g Oral BID   Continuous Infusions:    LOS: 4 days   Kerney Elbe, DO [facility] PAGER is on AMION  If 7PM-7AM, please contact night-coverage [email or web address]

## 2021-02-10 ENCOUNTER — Inpatient Hospital Stay (HOSPITAL_COMMUNITY): Payer: Medicare HMO

## 2021-02-10 LAB — CBC WITH DIFFERENTIAL/PLATELET
Abs Immature Granulocytes: 0.03 10*3/uL (ref 0.00–0.07)
Basophils Absolute: 0.1 10*3/uL (ref 0.0–0.1)
Basophils Relative: 1 %
Eosinophils Absolute: 0.4 10*3/uL (ref 0.0–0.5)
Eosinophils Relative: 5 %
HCT: 43 % (ref 39.0–52.0)
Hemoglobin: 14.3 g/dL (ref 13.0–17.0)
Immature Granulocytes: 0 %
Lymphocytes Relative: 33 %
Lymphs Abs: 2.8 10*3/uL (ref 0.7–4.0)
MCH: 31.9 pg (ref 26.0–34.0)
MCHC: 33.3 g/dL (ref 30.0–36.0)
MCV: 96 fL (ref 80.0–100.0)
Monocytes Absolute: 1.2 10*3/uL — ABNORMAL HIGH (ref 0.1–1.0)
Monocytes Relative: 14 %
Neutro Abs: 4.1 10*3/uL (ref 1.7–7.7)
Neutrophils Relative %: 47 %
Platelets: 462 10*3/uL — ABNORMAL HIGH (ref 150–400)
RBC: 4.48 MIL/uL (ref 4.22–5.81)
RDW: 13.2 % (ref 11.5–15.5)
WBC: 8.6 10*3/uL (ref 4.0–10.5)
nRBC: 0 % (ref 0.0–0.2)

## 2021-02-10 LAB — COMPREHENSIVE METABOLIC PANEL
ALT: 68 U/L — ABNORMAL HIGH (ref 0–44)
ALT: 88 U/L — ABNORMAL HIGH (ref 0–44)
AST: 39 U/L (ref 15–41)
AST: 54 U/L — ABNORMAL HIGH (ref 15–41)
Albumin: 3.9 g/dL (ref 3.5–5.0)
Albumin: 4.5 g/dL (ref 3.5–5.0)
Alkaline Phosphatase: 76 U/L (ref 38–126)
Alkaline Phosphatase: 87 U/L (ref 38–126)
Anion gap: 9 (ref 5–15)
Anion gap: 13 (ref 5–15)
BUN: 12 mg/dL (ref 6–20)
BUN: 12 mg/dL (ref 6–20)
CO2: 27 mmol/L (ref 22–32)
CO2: 24 mmol/L (ref 22–32)
Calcium: 8.6 mg/dL — ABNORMAL LOW (ref 8.9–10.3)
Calcium: 9.3 mg/dL (ref 8.9–10.3)
Chloride: 98 mmol/L (ref 98–111)
Chloride: 98 mmol/L (ref 98–111)
Creatinine, Ser: 0.98 mg/dL (ref 0.61–1.24)
Creatinine, Ser: 1.08 mg/dL (ref 0.61–1.24)
GFR, Estimated: >60 mL/min (ref >60)
GFR, Estimated: >60 mL/min (ref >60)
Glucose, Bld: 102 mg/dL — ABNORMAL HIGH (ref 70–99)
Glucose, Bld: 103 mg/dL — ABNORMAL HIGH (ref 70–99)
Potassium: 3.7 mmol/L (ref 3.5–5.1)
Potassium: 3.8 mmol/L (ref 3.5–5.1)
Sodium: 134 mmol/L — ABNORMAL LOW (ref 135–145)
Sodium: 135 mmol/L (ref 135–145)
Total Bilirubin: 0.5 mg/dL (ref 0.3–1.2)
Total Bilirubin: 0.8 mg/dL (ref 0.3–1.2)
Total Protein: 7.5 g/dL (ref 6.5–8.1)
Total Protein: 8.6 g/dL — ABNORMAL HIGH (ref 6.5–8.1)

## 2021-02-10 LAB — PHOSPHORUS: Phosphorus: 3.1 mg/dL (ref 2.5–4.6)

## 2021-02-10 LAB — MAGNESIUM: Magnesium: 2 mg/dL (ref 1.7–2.4)

## 2021-02-10 MED ORDER — LORAZEPAM 2 MG/ML IJ SOLN
1.0000 mg | Freq: Four times a day (QID) | INTRAMUSCULAR | Status: DC | PRN
  Administered 2021-02-10: 1 mg via INTRAVENOUS
  Filled 2021-02-10: qty 1

## 2021-02-10 MED ORDER — OXYCODONE HCL 5 MG PO TABS
5.0000 mg | ORAL_TABLET | Freq: Four times a day (QID) | ORAL | Status: DC | PRN
Start: 2021-02-10 — End: 2021-02-12
  Administered 2021-02-11 – 2021-02-12 (×4): 10 mg via ORAL
  Filled 2021-02-10 (×5): qty 2

## 2021-02-10 MED ORDER — ONDANSETRON HCL 4 MG/2ML IJ SOLN
4.0000 mg | Freq: Two times a day (BID) | INTRAMUSCULAR | Status: DC
  Administered 2021-02-10 – 2021-02-11 (×4): 4 mg via INTRAVENOUS
  Filled 2021-02-10 (×4): qty 2

## 2021-02-10 MED ORDER — HYDROXYZINE HCL 25 MG PO TABS: 25.0000 mg | ORAL_TABLET | Freq: Three times a day (TID) | ORAL | Status: DC | PRN

## 2021-02-10 MED ORDER — PROCHLORPERAZINE EDISYLATE 10 MG/2ML IJ SOLN
10.0000 mg | Freq: Four times a day (QID) | INTRAMUSCULAR | Status: DC | PRN
  Administered 2021-02-10: 10 mg via INTRAVENOUS
  Filled 2021-02-10: qty 2

## 2021-02-10 NOTE — Progress Notes (Signed)
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  He is complained of epigastric pain similar to episodes of pancreatitis.  He had nausea vomiting with decreased p.o. intake.  Lipase was elevated 52.  CT of the abdomen pelvis was done and showed mild acute pancreatitis.  He was admitted for further management of his acute on chronic pancreatitis and GIs been consulted.  He remained on a clear liquid diet yesterday with no real improvement so GI was consulted and they ordered an MRCP MRI of the pancreas MRI which showed mild acute pancreatitis.  He is feeling little bit better today and continues to take round-the-clock pain medication.  His diet was advanced to full liquid diet yesterday and GI advance it to a heart healthy diet yesterday however he continues to have significant nausea and pain today.  GI ordered an IgG4 and they feel that the patient may benefit from referral to a tertiary med center as an outpatient.  They are recommending continue heart healthy diet for now and do small frequent meals and patient was encouraged to ambulate.  Patient has not had a bowel movement since Friday so he was initiated on bowel regimen started on MiraLAX and will change his other laxative to senna docusate.  Continues to be nauseous and continues to have significant abdominal pain.  Tolerating his meals low and eating solid foods.  We cannot safely discharge him until he is off of IV medications so we will change him to p.o. oxycodone.  Given his persistent nausea GI has recommended scheduled twice daily Zofran and this has been initiated and we  have also added IV as needed Compazine.  Assessment & Plan:   Principal Problem:   Acute pancreatitis Active Problems:   Neuroendocrine tumor of pancreas  Acute pancreatitis and a history of patient with chronic pancreatitis -Has had a history of recurrent pancreatitis since his pancreatic surgery and was n.p.o. yesterday and placed on a clear liquid diet and was attempted to go to full liquid diet this morning but because patient had not tolerated clear liquid diet will cut back to clear liquid diet -We will continue supportive care and IV fluid hydration with lactated Ringer's at 125 MLS per hour and continue for now -Continue with antiemetics with ondansetron 4 mg p.o./IV every 6 as needed nausea and now GI recommended scheduling Zofran twice daily which we have done and I also have added IV Compazine 10 mg every 6 as needed for refractory nausea vomiting -Lipase level went from 52 and trended down to 31 but patient still having pain and nausea which is persistent -Continuing pantoprazole 40 mg p.o. twice daily -Continue with pain control with hydromorphone 1.5 mg every 3 hours as needed severe pain and will add p.o. pain control with p.o. oxycodone every 6 hours as needed -CT scan of the abdomen pelvis showed "Mild acute uncomplicated pancreatitis involving the pancreatic head. No fluid collection, pseudocyst, or abscess. Postsurgical changes from distal pancreatectomy and splenectomy. Hepatic steatosis, with areas of focal fatty sparing near the gallbladder fossa. Aortic Atherosclerosis." -GI has been consulted for further evaluation recommendations and they ordered an MRI of the abdomen with  and without contrast -MRI of the pancreas showed mild acute pancreatitis without evidence of complication and did show status post distal pancreatectomy with near complete resolution of prior surgical seroma along the surgical margins with no findings suspicious for recurrence.  There is no evidence of  intrahepatic or extrahepatic ductal dilatation noted no cholelithiasis or choledocholithiasis on MRI but did show the patient has severe hepatic steatosis -Patient's diet was advanced to full liquid diet yesterday and then advance to heart healthy diet by GI however patient continues to have some nausea and worsening pain with his meals so he was told to have small frequent meals -GI has ordered an IgG4 which is pending and they feel that they patient would benefit from an outpatient referral to a tertiary care center  History of neuroendocrine tumor of the tail of the pancreas -He is also had associated resection of the spleen in 2018 -Need to follow-up with oncology in outpatient setting -CT scan of abdomen pelvis as above did not show any evidence of any tumor recurrence  Abnormal LFTs n the setting of Hepatic Steatosis -In the setting of pancreatitis -Patient's ALT trend showed ALT of 62 -> 52 -> 42 -> 50 -> 52 and trended up to 69 and today it is now 68 -Patient's AST is now worsened and gone from 21 is now 46 -Continue to monitor and trend and if necessary will obtain further imaging with a right upper quadrant ultrasound\ -GI is following and defer to them for further work-up  Leukocytosis -Mild and likely reactive in the setting of pancreatitis -WBC is trended down and normalized as it went from 16.1 -> 14.2 -> 11.5 -> 10.3 -> 9.1 and is now normalized at 7.4 yesterday and today it is 8.6 -Continue to monitor for signs and symptoms of infection -Repeat CBC in a.m.  New onset Diabetes Mellitus Type 2 -Patient's hemoglobin A1c was 6.7 -Continue monitor CBGs carefully and if necessary will place on sensitive NovoLog/scale insulin AC -He will benefit from low-dose metformin once he is at discharge or he can follow-up and discuss with PCP for further management -CBGs ranging from 81-123; blood sugar on a.m. CMP was 102  Hyperlipidemia/elevated LDL -Patient is lipid panel done and  showed a total cholesterol/HDL ratio of 4.7, cholesterol level 161, HDL 34, LDL 113, triglycerides of 71, and VLDL 14 -Immediate indication for treatment as an inpatient and can follow-up with the PCP in outpatient setting to be placed on statin  Constipation -Added MiraLAX 17 g p.o. twice daily and will change his docusate sodium 100 mg daily as needed to senna docusate scheduled 1 tab p.o. twice daily and also add a bisacodyl suppository 10 mg rectally daily as needed  Thrombocytosis -Mild and likely reactive in the setting of above -Patient's platelet count went from 467 -> 469 -> 447 -> 459 -> 469 -> 462 x2 -Continue to monitor and trend and repeat CBC in a.m.  Obesity -Complicates overall prognosis and care -Estimated body mass index is 30.62 kg/m as calculated from the following:   Height as of this encounter: 6\' 3"  (1.905 m).   Weight as of this encounter: 111.1 kg.  -Weight Loss and Dietary Counseling given   DVT prophylaxis: Enoxaparin 40 mg subcu q. 24  Code Status: FULL CODE Family Communication: No family present at bedside  Disposition Plan: Pending further clinical improvement of his pancreatitis and tolerance of diet and clearance by GI.  Still has significant pain and nausea which will need to  be addressed prior to discharge  Status is: Inpatient  Remains inpatient appropriate because: Will still need to tolerate p.o. intake and improvement of his pancreatitis  Consultants:  Gastroenterology   Procedures: MRI of the abdomen with and without contrast  Antimicrobials:  Anti-infectives (From admission, onward)    None        Subjective: Seen and examined at bedside and has been ambulating the halls and eating a soft diet however continues to have significant amount of pain and nausea.  States the nausea is persistent and his abdominal pain is still there as well.  No chest pain or lightheadedness or dizziness.  He continues to have IV pain medications  around-the-clock so we will try and wean to oral medications and she has recommended changing to scheduled IV Zofran.  We will continue to monitor given that he continues to have significant pain and nausea.  No other concerns or complaints at this time.  Objective: Vitals:   02/09/21 0359 02/09/21 1607 02/09/21 2010 02/10/21 0526  BP: 125/81 136/88 (!) 144/85 126/82  Pulse: 74 (!) 59 73 68  Resp: 18 20 16 18   Temp: 98 F (36.7 C) 98.6 F (37 C) 98.1 F (36.7 C) 98 F (36.7 C)  TempSrc:   Oral Oral  SpO2: 93% 96% 94% 91%  Weight:      Height:        Intake/Output Summary (Last 24 hours) at 02/10/2021 1439 Last data filed at 02/09/2021 1551 Gross per 24 hour  Intake 236 ml  Output --  Net 236 ml    Filed Weights   02/04/21 1434  Weight: 111.1 kg   Examination: Physical Exam:  Constitutional: WN/WD Caucasian male currently no acute distress appears calm but slightly uncomfortable Eyes: Lids and conjunctivae normal, sclerae anicteric  ENMT: External Ears, Nose appear normal. Grossly normal hearing. Mucous membranes are moist.  Neck: Appears normal, supple, no cervical masses, normal ROM, no appreciable thyromegaly; no appreciable JVD  Respiratory: Clear to auscultation bilaterally, no wheezing, rales, rhonchi or crackles. Normal respiratory effort and patient is not tachypenic. No accessory muscle use.  Cardiovascular: RRR, no murmurs / rubs / gallops. S1 and S2 auscultated.  Mild extremity edema Abdomen: Soft, slightly-tender, distended secondary body habitus. Bowel sounds positive.  GU: Deferred. Musculoskeletal: No clubbing / cyanosis of digits/nails. No joint deformity upper and lower extremities.  Skin: No rashes, lesions, ulcers on limited skin evaluation. No induration; Warm and dry.  Neurologic: CN 2-12 grossly intact with no focal deficits.  Romberg sign and cerebellar reflexes not assessed.  Psychiatric: Normal judgment and insight. Alert and oriented x 3. Normal  mood and appropriate affect.   Data Reviewed: I have personally reviewed following labs and imaging studies  CBC: Recent Labs  Lab 02/06/21 0514 02/07/21 0529 02/08/21 0459 02/09/21 0521 02/10/21 0542  WBC 11.5* 10.3 9.1 7.4 8.6  NEUTROABS 6.7 4.7 4.1 3.3 4.1  HGB 14.1 15.0 13.7 14.5 14.3  HCT 42.1 45.3 41.5 43.2 43.0  MCV 96.3 96.8 96.1 96.0 96.0  PLT 447* 459* 469* 462* 462*    Basic Metabolic Panel: Recent Labs  Lab 02/06/21 0514 02/07/21 0529 02/08/21 0459 02/09/21 0521 02/10/21 0542  NA 136 136 136 137 134*  K 4.2 3.9 3.8 4.0 3.7  CL 99 97* 97* 101 98  CO2 28 28 28 30 27   GLUCOSE 81 84 81 100* 102*  BUN 11 12 11 12 12   CREATININE 0.96 1.10 1.10 1.05 0.98  CALCIUM 8.9 9.4  9.0 8.7* 8.6*  MG 2.0 2.1 1.8 2.1 2.0  PHOS 4.0 3.3 3.4 3.7 3.1    GFR: Estimated Creatinine Clearance: 129.4 mL/min (by C-G formula based on SCr of 0.98 mg/dL). Liver Function Tests: Recent Labs  Lab 02/06/21 0514 02/07/21 0529 02/08/21 0459 02/09/21 0521 02/10/21 0542  AST 21 32 33 46* 39  ALT 42 50* 52* 69* 68*  ALKPHOS 77 87 78 78 76  BILITOT 0.7 0.6 1.0 0.6 0.5  PROT 7.8 8.3* 7.5 7.4 7.5  ALBUMIN 4.0 4.4 3.9 3.8 3.9    Recent Labs  Lab 02/04/21 1503 02/06/21 0514  LIPASE 52* 31    No results for input(s): AMMONIA in the last 168 hours. Coagulation Profile: No results for input(s): INR, PROTIME in the last 168 hours. Cardiac Enzymes: No results for input(s): CKTOTAL, CKMB, CKMBINDEX, TROPONINI in the last 168 hours. BNP (last 3 results) No results for input(s): PROBNP in the last 8760 hours. HbA1C: No results for input(s): HGBA1C in the last 72 hours.  CBG: No results for input(s): GLUCAP in the last 168 hours. Lipid Profile: No results for input(s): CHOL, HDL, LDLCALC, TRIG, CHOLHDL, LDLDIRECT in the last 72 hours.  Thyroid Function Tests: No results for input(s): TSH, T4TOTAL, FREET4, T3FREE, THYROIDAB in the last 72 hours.  Anemia Panel: No results for  input(s): VITAMINB12, FOLATE, FERRITIN, TIBC, IRON, RETICCTPCT in the last 72 hours. Sepsis Labs: No results for input(s): PROCALCITON, LATICACIDVEN in the last 168 hours.  Recent Results (from the past 240 hour(s))  Resp Panel by RT-PCR (Flu A&B, Covid) Nasopharyngeal Swab     Status: None   Collection Time: 02/04/21  7:06 PM   Specimen: Nasopharyngeal Swab; Nasopharyngeal(NP) swabs in vial transport medium  Result Value Ref Range Status   SARS Coronavirus 2 by RT PCR NEGATIVE NEGATIVE Final    Comment: (NOTE) SARS-CoV-2 target nucleic acids are NOT DETECTED.  The SARS-CoV-2 RNA is generally detectable in upper respiratory specimens during the acute phase of infection. The lowest concentration of SARS-CoV-2 viral copies this assay can detect is 138 copies/mL. A negative result does not preclude SARS-Cov-2 infection and should not be used as the sole basis for treatment or other patient management decisions. A negative result may occur with  improper specimen collection/handling, submission of specimen other than nasopharyngeal swab, presence of viral mutation(s) within the areas targeted by this assay, and inadequate number of viral copies(<138 copies/mL). A negative result must be combined with clinical observations, patient history, and epidemiological information. The expected result is Negative.  Fact Sheet for Patients:  EntrepreneurPulse.com.au  Fact Sheet for Healthcare Providers:  IncredibleEmployment.be  This test is no t yet approved or cleared by the [place] FDA and  has been authorized for detection and/or diagnosis of SARS-CoV-2 by FDA under an Emergency Use Authorization (EUA). This EUA will remain  in effect (meaning this test can be used) for the duration of the COVID-19 declaration under Section 564(b)(1) of the Act, 21 U.S.C.section 360bbb-3(b)(1), unless the authorization is terminated  or revoked sooner.        Influenza A by PCR NEGATIVE NEGATIVE Final   Influenza B by PCR NEGATIVE NEGATIVE Final    Comment: (NOTE) The Xpert Xpress SARS-CoV-2/FLU/RSV plus assay is intended as an aid in the diagnosis of influenza from Nasopharyngeal swab specimens and should not be used as a sole basis for treatment. Nasal washings and aspirates are unacceptable for Xpert Xpress SARS-CoV-2/FLU/RSV testing.  Fact Sheet for Patients: EntrepreneurPulse.com.au  Fact  Sheet for Healthcare Providers: IncredibleEmployment.be  This test is not yet approved or cleared by the [place] and has been authorized for detection and/or diagnosis of SARS-CoV-2 by FDA under an Emergency Use Authorization (EUA). This EUA will remain in effect (meaning this test can be used) for the duration of the COVID-19 declaration under Section 564(b)(1) of the Act, 21 U.S.C. section 360bbb-3(b)(1), unless the authorization is terminated or revoked.  Performed at [facility], [address] [place], [place] [address]      RN Pressure Injury Documentation:     Estimated body mass index is 30.62 kg/m as calculated from the following:   Height as of this encounter: 6\' 3"  (1.905 m).   Weight as of this encounter: 111.1 kg.  Malnutrition Type:   Malnutrition Characteristics:   Nutrition Interventions:    Radiology Studies: No results found.  Scheduled Meds:  enoxaparin (LOVENOX) injection  40 mg Subcutaneous Q24H   ondansetron (ZOFRAN) IV  4 mg Intravenous BID   pantoprazole  40 mg Oral BID   polyethylene glycol  17 g Oral BID   senna-docusate  1 tablet Oral BID   Continuous Infusions:   LOS: 5 days   Kerney Elbe, DO [facility] PAGER is on AMION  If 7PM-7AM, please contact night-coverage [email or web address]

## 2021-02-10 NOTE — Progress Notes (Signed)
[facility] Progress Note    Since last GI note: EAting solid foods without worsening of his abd pains which he says is around an 8/10 usually.  Ambulating in halls. HAs been very nauseas, this is his biggest complaint this morning.  Objective: Vital signs in last 24 hours: Temp:  [98 F (36.7 C)-98.6 F (37 C)] 98 F (36.7 C) (12/17 0526) Pulse Rate:  [59-73] 68 (12/17 0526) Resp:  [16-20] 18 (12/17 0526) BP: (126-144)/(82-88) 126/82 (12/17 0526) SpO2:  [91 %-96 %] 91 % (12/17 0526) Last BM Date: 01/31/21 General: alert and oriented times 3 Heart: regular rate and rythm Abdomen: soft, non-tender, non-distended, normal bowel sounds   Lab Results: Recent Labs    02/08/21 0459 02/09/21 0521 02/10/21 0542  WBC 9.1 7.4 8.6  HGB 13.7 14.5 14.3  PLT 469* 462* 462*  MCV 96.1 96.0 96.0   Recent Labs    02/08/21 0459 02/09/21 0521 02/10/21 0542  NA 136 137 134*  K 3.8 4.0 3.7  CL 97* 101 98  CO2 28 30 27   GLUCOSE 81 100* 102*  BUN 11 12 12   CREATININE 1.10 1.05 0.98  CALCIUM 9.0 8.7* 8.6*   Recent Labs    02/08/21 0459 02/09/21 0521 02/10/21 0542  PROT 7.5 7.4 7.5  ALBUMIN 3.9 3.8 3.9  AST 33 46* 39  ALT 52* 69* 68*  ALKPHOS 78 78 76  BILITOT 1.0 0.6 0.5   Medications: Scheduled Meds:  enoxaparin (LOVENOX) injection  40 mg Subcutaneous Q24H   pantoprazole  40 mg Oral BID   polyethylene glycol  17 g Oral BID   senna-docusate  1 tablet Oral BID   Assessment/Plan: 45 y.o. 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 No sign of recurrence on MRI this admission.  No stones in GB, non-drinker. IgG 4 level sent yesterday, results still pending.  He is still requiring IV pain meds, IV anti nausea meds but is eating solid foods and ambulating in the halls.  Not tender on exam.  He understands that he cannot safely d/c until he does not need IV Meds.  Would  change to oral pain meds and scheduled BID zofran and see how he does.    [facility] will return to see him on Monday if he is still in hospital. Available 24/7 by pager if needed.  I will arrange outpatient follow up with me in next few weeks to see how he has recovered, consider tertiary GI referral at that point.   Milus Banister, MD  02/10/2021, 9:20 AM [facility] Pager [phone]

## 2021-02-11 LAB — MAGNESIUM: Magnesium: 2.3 mg/dL (ref 1.7–2.4)

## 2021-02-11 LAB — COMPREHENSIVE METABOLIC PANEL
ALT: 89 U/L — ABNORMAL HIGH (ref 0–44)
AST: 54 U/L — ABNORMAL HIGH (ref 15–41)
Albumin: 4.2 g/dL (ref 3.5–5.0)
Alkaline Phosphatase: 82 U/L (ref 38–126)
Anion gap: 11 (ref 5–15)
BUN: 12 mg/dL (ref 6–20)
CO2: 27 mmol/L (ref 22–32)
Calcium: 9.2 mg/dL (ref 8.9–10.3)
Chloride: 98 mmol/L (ref 98–111)
Creatinine, Ser: 1.02 mg/dL (ref 0.61–1.24)
GFR, Estimated: >60 mL/min (ref >60)
Glucose, Bld: 104 mg/dL — ABNORMAL HIGH (ref 70–99)
Potassium: 4.2 mmol/L (ref 3.5–5.1)
Sodium: 136 mmol/L (ref 135–145)
Total Bilirubin: 0.7 mg/dL (ref 0.3–1.2)
Total Protein: 8.1 g/dL (ref 6.5–8.1)

## 2021-02-11 LAB — PHOSPHORUS: Phosphorus: 3.7 mg/dL (ref 2.5–4.6)

## 2021-02-11 MED ORDER — HYDROMORPHONE HCL 1 MG/ML IJ SOLN
1.5000 mg | INTRAMUSCULAR | Status: DC | PRN
  Administered 2021-02-11 – 2021-02-12 (×5): 1.5 mg via INTRAVENOUS
  Filled 2021-02-11 (×6): qty 1.5

## 2021-02-11 NOTE — Progress Notes (Signed)
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  He is complained of epigastric pain similar to episodes of pancreatitis.  He had nausea vomiting with decreased p.o. intake.  Lipase was elevated 52.  CT of the abdomen pelvis was done and showed mild acute pancreatitis.  He was admitted for further management of his acute on chronic pancreatitis and GIs been consulted.  He remained on a clear liquid diet yesterday with no real improvement so GI was consulted and they ordered an MRCP MRI of the pancreas MRI which showed mild acute pancreatitis.  He is feeling little bit better today and continues to take round-the-clock pain medication.  His diet was advanced to full liquid diet yesterday and GI advance it to a heart healthy diet yesterday however he continues to have significant nausea and pain today.  GI ordered an IgG4 and they feel that the patient may benefit from referral to a tertiary med center as an outpatient.  They are recommending continue heart healthy diet for now and do small frequent meals and patient was encouraged to ambulate.  Patient has not had a bowel movement since Friday so he was initiated on bowel regimen started on MiraLAX and will change his other laxative to senna docusate.  Continues to be nauseous and continues to have significant abdominal pain.  Tolerating his meals low and eating solid foods.  We cannot safely discharge him until he is off of IV medications so we will change him to p.o. oxycodone.  Given his persistent nausea GI has recommended scheduled twice daily Zofran and this has been initiated and we  have also added IV as needed Compazine but patient had an adverse effect to IV Compazine.  We will initiate a p.o. pain regimen and patient is starting that and starting to wean off of IV pain medication.  We will cut back on the frequency of his IVs and continue p.o.'s and assess whether patient can be discharged home in the next 24 to 48 hours if tolerating his diet without pain and nausea.  Assessment & Plan:   Principal Problem:   Acute pancreatitis Active Problems:   Neuroendocrine tumor of pancreas  Acute pancreatitis and a history of patient with chronic pancreatitis -Has had a history of recurrent pancreatitis since his pancreatic surgery and was n.p.o. yesterday and placed on a clear liquid diet and was attempted to go to full liquid diet this morning but because patient had not tolerated clear liquid diet will cut back to clear liquid diet -We will continue supportive care and IV fluid hydration with lactated Ringer's at 125 MLS per hour and continue for now -Continue with antiemetics with ondansetron 4 mg p.o./IV every 6 as needed nausea and now GI recommended scheduling Zofran twice daily which we have done; he states that his nausea is improving and his pain was a 6/10 today -Lipase level went from 52 and trended down to 31 but patient still having pain and nausea which is persistent -Continuing pantoprazole 40 mg p.o. twice daily -Continue with pain control with hydromorphone 1.5 mg but change from every 3 hours as needed to every 4 hours as needed for  severe pain and will add p.o. pain control with p.o. oxycodone every 6 hours as needed.  Recommended to take 5-10 mg of p.o. oxycodone first -CT scan of the abdomen pelvis showed "Mild acute uncomplicated pancreatitis involving the pancreatic head. No fluid collection, pseudocyst, or abscess. Postsurgical changes from distal pancreatectomy and splenectomy. Hepatic steatosis, with areas of focal fatty sparing near the gallbladder fossa.  Aortic Atherosclerosis." -GI has been consulted for further evaluation recommendations and they ordered an MRI of the abdomen with and without contrast -MRI of the pancreas showed mild acute pancreatitis without evidence of complication and did show status post distal pancreatectomy with near complete resolution of prior surgical seroma along the surgical margins with no findings suspicious for recurrence.  There is no evidence of intrahepatic or extrahepatic ductal dilatation noted no cholelithiasis or choledocholithiasis on MRI but did show the patient has severe hepatic steatosis -Patient is now on a soft diet -GI has ordered an IgG4 which is pending and they feel that they patient would benefit from an outpatient referral to a tertiary care center  History of neuroendocrine tumor of the tail of the pancreas -He is also had associated resection of the spleen in 2018 -Need to follow-up with oncology in outpatient setting -CT scan of abdomen pelvis as above did not show any evidence of any tumor recurrence  Abnormal LFTs n the setting of Hepatic Steatosis -In the setting of pancreatitis -Patient's ALT trend showed ALT of 62 -> 52 -> 42 -> 50 -> 52 -> 69 -> 68 -> 88 -> 89 -Patient's AST is now worsened and gone from 21 is now 46 -> 39 -> 54 -> 54 -Continue to monitor and trend and if necessary will obtain further imaging with a right upper quadrant ultrasound -GI is following and defer to them for further work-up  Leukocytosis -Mild and likely reactive in the setting of pancreatitis -WBC is trended down and normalized as it went from 16.1 -> 14.2 -> 11.5 -> 10.3 -> 9.1 -> 7.4 -> 8.6 -Continue to monitor for signs and symptoms of infection -Repeat CBC in a.m.  New onset Diabetes Mellitus Type 2 -Patient's hemoglobin A1c was 6.7 -Continue monitor CBGs carefully and if necessary will place on sensitive NovoLog/scale insulin AC -He will benefit from low-dose metformin once he is at discharge  or he can follow-up and discuss with PCP for further management -CBGs ranging from 81-123; blood sugar on a.m. CMP was 102  Hyperlipidemia/elevated LDL -Patient is lipid panel done and showed a total cholesterol/HDL ratio of 4.7, cholesterol level 161, HDL 34, LDL 113, triglycerides of 71, and VLDL 14 -Immediate indication for treatment as an inpatient and can follow-up with the PCP in outpatient setting to be placed on statin  Constipation -Added MiraLAX 17 g p.o. twice daily and will change his docusate sodium 100 mg daily as needed to senna docusate scheduled 1 tab p.o. twice daily and also add a bisacodyl suppository 10 mg rectally daily as needed -11 abdominal  Thrombocytosis -Mild and likely reactive in the setting of above -Patient's platelet count went from 467 -> 469 -> 447 -> 459 -> 469 -> 462 x2 on last check  -Continue to monitor and trend and repeat CBC in a.m.  Obesity -Complicates overall prognosis and care -Estimated body mass index is 30.62 kg/m as calculated from the following:   Height as of this encounter: 6\' 3"  (1.905 m).   Weight as of this encounter: 111.1 kg.  -Weight Loss and Dietary  Counseling given   DVT prophylaxis: Enoxaparin 40 mg subcu q. 24  Code Status: FULL CODE Family Communication: No family present at bedside  Disposition Plan: Pending further clinical improvement of his pancreatitis and tolerance of diet and clearance by GI.  Still has significant pain and nausea but this is improving and anticipating discharge in next 24 to 48 hours  Status is: Inpatient  Remains inpatient appropriate because: Will still need to tolerate p.o. intake and improvement of his pancreatitis  Consultants:  Gastroenterology   Procedures: MRI of the abdomen with and without contrast  Antimicrobials:  Anti-infectives (From admission, onward)    None        Subjective: Seen and examined at bedside and states that he had an adverse reaction to the Compazine.   Doom and a panic attack.  States his nausea is improving and he feels better.  States his pain is also improved and is a 6 out of 10 today.  Still has not had a bowel movement yet.  No other concerns or complaints this time.  Objective: Vitals:   02/10/21 2100 02/11/21 0112 02/11/21 0619 02/11/21 1400  BP:  (!) 133/97 124/86 (!) 128/98  Pulse:  73 65 82  Resp: 16 18 16 18   Temp:  97.8 F (36.6 C) 97.6 F (36.4 C) 97.7 F (36.5 C)  TempSrc:  Oral Oral Oral  SpO2:  98% 93% 93%  Weight:      Height:       No intake or output data in the 24 hours ending 02/11/21 1423  Filed Weights   02/04/21 1434  Weight: 111.1 kg   Examination: Physical Exam:  Constitutional: WN/WD Caucasian male in NAD and appears calm and comfortable Eyes: PERRL, lids and conjunctivae normal, sclerae anicteric  ENMT: External Ears, Nose appear normal. Grossly normal hearing. Mucous membranes are moist. No JVD Neck: Appears normal, supple, no cervical masses, normal ROM, no appreciable thyromegaly Respiratory: Slightly diminished to auscultation bilaterally, no wheezing, rales, rhonchi or crackles. Normal respiratory effort and patient is not tachypenic. No accessory muscle use. Unlabored breathing  Cardiovascular: RRR, no murmurs / rubs / gallops. S1 and S2 auscultated..  Abdomen: Soft, A little-tender, Distended 2/2 body habitus. Bowel sounds positive.  GU: Deferred. Musculoskeletal: No clubbing / cyanosis of digits/nails. No joint deformity upper and lower extremities. Skin: No rashes, lesions, ulcers on a limited skin. No induration; Warm and dry.  Neurologic: CN 2-12 grossly intact with no focal deficits. Romberg sign and cerebellar reflexes not assessed.  Psychiatric: Normal judgment and insight. Alert and oriented x 3. Normal mood and appropriate affect.   Data Reviewed: I have personally reviewed following labs and imaging studies  CBC: Recent Labs  Lab 02/06/21 0514 02/07/21 0529 02/08/21 0459  02/09/21 0521 02/10/21 0542  WBC 11.5* 10.3 9.1 7.4 8.6  NEUTROABS 6.7 4.7 4.1 3.3 4.1  HGB 14.1 15.0 13.7 14.5 14.3  HCT 42.1 45.3 41.5 43.2 43.0  MCV 96.3 96.8 96.1 96.0 96.0  PLT 447* 459* 469* 462* 462*    Basic Metabolic Panel: Recent Labs  Lab 02/07/21 0529 02/08/21 0459 02/09/21 0521 02/10/21 0542 02/10/21 1528 02/11/21 0521  NA 136 136 137 134* 135 136  K 3.9 3.8 4.0 3.7 3.8 4.2  CL 97* 97* 101 98 98 98  CO2 28 28 30 27 24 27   GLUCOSE 84 81 100* 102* 103* 104*  BUN 12 11 12 12 12 12   CREATININE 1.10 1.10 1.05 0.98 1.08 1.02  CALCIUM 9.4  9.0 8.7* 8.6* 9.3 9.2  MG 2.1 1.8 2.1 2.0  --  2.3  PHOS 3.3 3.4 3.7 3.1  --  3.7    GFR: Estimated Creatinine Clearance: 124.3 mL/min (by C-G formula based on SCr of 1.02 mg/dL). Liver Function Tests: Recent Labs  Lab 02/08/21 0459 02/09/21 0521 02/10/21 0542 02/10/21 1528 02/11/21 0521  AST 33 46* 39 54* 54*  ALT 52* 69* 68* 88* 89*  ALKPHOS 78 78 76 87 82  BILITOT 1.0 0.6 0.5 0.8 0.7  PROT 7.5 7.4 7.5 8.6* 8.1  ALBUMIN 3.9 3.8 3.9 4.5 4.2    Recent Labs  Lab 02/04/21 1503 02/06/21 0514  LIPASE 52* 31    No results for input(s): AMMONIA in the last 168 hours. Coagulation Profile: No results for input(s): INR, PROTIME in the last 168 hours. Cardiac Enzymes: No results for input(s): CKTOTAL, CKMB, CKMBINDEX, TROPONINI in the last 168 hours. BNP (last 3 results) No results for input(s): PROBNP in the last 8760 hours. HbA1C: No results for input(s): HGBA1C in the last 72 hours.  CBG: No results for input(s): GLUCAP in the last 168 hours. Lipid Profile: No results for input(s): CHOL, HDL, LDLCALC, TRIG, CHOLHDL, LDLDIRECT in the last 72 hours.  Thyroid Function Tests: No results for input(s): TSH, T4TOTAL, FREET4, T3FREE, THYROIDAB in the last 72 hours.  Anemia Panel: No results for input(s): VITAMINB12, FOLATE, FERRITIN, TIBC, IRON, RETICCTPCT in the last 72 hours. Sepsis Labs: No results for  input(s): PROCALCITON, LATICACIDVEN in the last 168 hours.  Recent Results (from the past 240 hour(s))  Resp Panel by RT-PCR (Flu A&B, Covid) Nasopharyngeal Swab     Status: None   Collection Time: 02/04/21  7:06 PM   Specimen: Nasopharyngeal Swab; Nasopharyngeal(NP) swabs in vial transport medium  Result Value Ref Range Status   SARS Coronavirus 2 by RT PCR NEGATIVE NEGATIVE Final    Comment: (NOTE) SARS-CoV-2 target nucleic acids are NOT DETECTED.  The SARS-CoV-2 RNA is generally detectable in upper respiratory specimens during the acute phase of infection. The lowest concentration of SARS-CoV-2 viral copies this assay can detect is 138 copies/mL. A negative result does not preclude SARS-Cov-2 infection and should not be used as the sole basis for treatment or other patient management decisions. A negative result may occur with  improper specimen collection/handling, submission of specimen other than nasopharyngeal swab, presence of viral mutation(s) within the areas targeted by this assay, and inadequate number of viral copies(<138 copies/mL). A negative result must be combined with clinical observations, patient history, and epidemiological information. The expected result is Negative.  Fact Sheet for Patients:  EntrepreneurPulse.com.au  Fact Sheet for Healthcare Providers:  IncredibleEmployment.be  This test is no t yet approved or cleared by the [place] FDA and  has been authorized for detection and/or diagnosis of SARS-CoV-2 by FDA under an Emergency Use Authorization (EUA). This EUA will remain  in effect (meaning this test can be used) for the duration of the COVID-19 declaration under Section 564(b)(1) of the Act, 21 U.S.C.section 360bbb-3(b)(1), unless the authorization is terminated  or revoked sooner.       Influenza A by PCR NEGATIVE NEGATIVE Final   Influenza B by PCR NEGATIVE NEGATIVE Final    Comment: (NOTE) The  Xpert Xpress SARS-CoV-2/FLU/RSV plus assay is intended as an aid in the diagnosis of influenza from Nasopharyngeal swab specimens and should not be used as a sole basis for treatment. Nasal washings and aspirates are unacceptable for Xpert Xpress SARS-CoV-2/FLU/RSV testing.  Fact Sheet for Patients: EntrepreneurPulse.com.au  Fact Sheet for Healthcare Providers: IncredibleEmployment.be  This test is not yet approved or cleared by the [place] FDA and has been authorized for detection and/or diagnosis of SARS-CoV-2 by FDA under an Emergency Use Authorization (EUA). This EUA will remain in effect (meaning this test can be used) for the duration of the COVID-19 declaration under Section 564(b)(1) of the Act, 21 U.S.C. section 360bbb-3(b)(1), unless the authorization is terminated or revoked.  Performed at [facility], [address] [place], [place] [address]      RN Pressure Injury Documentation:     Estimated body mass index is 30.62 kg/m as calculated from the following:   Height as of this encounter: 6\' 3"  (1.905 m).   Weight as of this encounter: 111.1 kg.  Malnutrition Type:   Malnutrition Characteristics:   Nutrition Interventions:    Radiology Studies: DG CHEST PORT 1 VIEW  Result Date: 02/10/2021 CLINICAL DATA:  Shortness of breath.  Left lower chest pain. EXAM: PORTABLE CHEST 1 VIEW COMPARISON:  August 16, 2016 FINDINGS: The heart size and mediastinal contours are within normal limits. Both lungs are clear. The visualized skeletal structures are unremarkable. IMPRESSION: No active disease. Electronically Signed   By: Dorise Bullion III M.D.   On: 02/10/2021 16:48    Scheduled Meds:  enoxaparin (LOVENOX) injection  40 mg Subcutaneous Q24H   ondansetron (ZOFRAN) IV  4 mg Intravenous BID   pantoprazole  40 mg Oral BID   polyethylene glycol  17 g Oral BID   senna-docusate  1 tablet Oral BID   Continuous  Infusions:   LOS: 6 days   Kerney Elbe, DO [facility] PAGER is on AMION  If 7PM-7AM, please contact night-coverage [email or web address]

## 2021-02-12 ENCOUNTER — Other Ambulatory Visit: Payer: Self-pay | Admitting: Internal Medicine

## 2021-02-12 ENCOUNTER — Telehealth: Payer: Self-pay

## 2021-02-12 DIAGNOSIS — K858 Other acute pancreatitis without necrosis or infection: Secondary | ICD-10-CM

## 2021-02-12 DIAGNOSIS — K859 Acute pancreatitis without necrosis or infection, unspecified: Secondary | ICD-10-CM

## 2021-02-12 LAB — CBC WITH DIFFERENTIAL/PLATELET
Abs Immature Granulocytes: 0.02 10*3/uL (ref 0.00–0.07)
Basophils Absolute: 0.1 10*3/uL (ref 0.0–0.1)
Basophils Relative: 1 %
Eosinophils Absolute: 0.2 10*3/uL (ref 0.0–0.5)
Eosinophils Relative: 3 %
HCT: 45.7 % (ref 39.0–52.0)
Hemoglobin: 15.4 g/dL (ref 13.0–17.0)
Immature Granulocytes: 0 %
Lymphocytes Relative: 39 %
Lymphs Abs: 2.9 10*3/uL (ref 0.7–4.0)
MCH: 31.9 pg (ref 26.0–34.0)
MCHC: 33.7 g/dL (ref 30.0–36.0)
MCV: 94.6 fL (ref 80.0–100.0)
Monocytes Absolute: 0.8 10*3/uL (ref 0.1–1.0)
Monocytes Relative: 11 %
Neutro Abs: 3.4 10*3/uL (ref 1.7–7.7)
Neutrophils Relative %: 46 %
Platelets: 510 10*3/uL — ABNORMAL HIGH (ref 150–400)
RBC: 4.83 MIL/uL (ref 4.22–5.81)
RDW: 13.2 % (ref 11.5–15.5)
WBC: 7.5 10*3/uL (ref 4.0–10.5)
nRBC: 0 % (ref 0.0–0.2)

## 2021-02-12 LAB — COMPREHENSIVE METABOLIC PANEL
ALT: 92 U/L — ABNORMAL HIGH (ref 0–44)
AST: 48 U/L — ABNORMAL HIGH (ref 15–41)
Albumin: 4.2 g/dL (ref 3.5–5.0)
Alkaline Phosphatase: 81 U/L (ref 38–126)
Anion gap: 9 (ref 5–15)
BUN: 16 mg/dL (ref 6–20)
CO2: 28 mmol/L (ref 22–32)
Calcium: 9.3 mg/dL (ref 8.9–10.3)
Chloride: 100 mmol/L (ref 98–111)
Creatinine, Ser: 1.14 mg/dL (ref 0.61–1.24)
GFR, Estimated: >60 mL/min (ref >60)
Glucose, Bld: 106 mg/dL — ABNORMAL HIGH (ref 70–99)
Potassium: 4.2 mmol/L (ref 3.5–5.1)
Sodium: 137 mmol/L (ref 135–145)
Total Bilirubin: 0.8 mg/dL (ref 0.3–1.2)
Total Protein: 8.3 g/dL — ABNORMAL HIGH (ref 6.5–8.1)

## 2021-02-12 LAB — IGG 4: IgG, Subclass 4: 98 mg/dL — ABNORMAL HIGH (ref 2–96)

## 2021-02-12 LAB — MAGNESIUM: Magnesium: 2.2 mg/dL (ref 1.7–2.4)

## 2021-02-12 LAB — PHOSPHORUS: Phosphorus: 4.1 mg/dL (ref 2.5–4.6)

## 2021-02-12 MED ORDER — HYDROMORPHONE HCL 1 MG/ML IJ SOLN
1.0000 mg | INTRAMUSCULAR | Status: DC | PRN
  Administered 2021-02-12: 10:00:00 1 mg via INTRAVENOUS
  Filled 2021-02-12: qty 1

## 2021-02-12 MED ORDER — ACETAMINOPHEN 325 MG PO TABS
650.0000 mg | ORAL_TABLET | Freq: Four times a day (QID) | ORAL | 0 refills | Status: DC | PRN
Start: 2021-02-12 — End: 2021-04-04

## 2021-02-12 MED ORDER — POLYETHYLENE GLYCOL 3350 17 G PO PACK: 17.0000 g | PACK | Freq: Every day | ORAL | Status: DC | PRN

## 2021-02-12 MED ORDER — ONDANSETRON HCL 4 MG PO TABS
4.0000 mg | ORAL_TABLET | Freq: Four times a day (QID) | ORAL | 0 refills | Status: DC | PRN
Start: 2021-02-12 — End: 2021-04-04

## 2021-02-12 MED ORDER — OXYCODONE HCL 5 MG PO TABS: 5.0000 mg | ORAL_TABLET | Freq: Four times a day (QID) | ORAL | 0 refills | Status: DC | PRN

## 2021-02-12 MED ORDER — POLYETHYLENE GLYCOL 3350 17 G PO PACK
17.0000 g | PACK | Freq: Every day | ORAL | 0 refills | Status: DC | PRN
Start: 2021-02-12 — End: 2021-04-04

## 2021-02-12 NOTE — Progress Notes (Addendum)
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 Barry Dienes in October 2019      Recurrent acute mild pancreatitis. This seems to be at least his second documented episode since distal pancreatectomy in Oct 2019. First episode was in April 2020 at which time MRI suggested mild pancreatitis.  Etiology unclear, doesn't have usual risk factors.    --No ETOH, denies family history of CF. --Liver chemistries okay except for trivial elevation in ALT in setting of fatty liver.  --Normal serum Ca+, normal Triglycerides --Not on any medications commonly associated with pancreatitis.   - Still pending IgG4 at this time -Patient still may benefit from referral to tertiary med center as outpatient, especially if this IgG4 is negative. -Continue heart healthy diet for now, do small frequent meals, avoid fatty foods. -Continue p.o. pain medications, can try to extend how frequently they are needed. -Continue p.o. antiemetics. -Continue to ambulate.   At this time patient does appear to be doing very well from pancreatitis perspective, he is on p.o. medications and increasing the time needed between, he is ambulating, and he is eating.  From GI perspective patient can go home likely today or tomorrow with close outpatient follow-up.  We will need to monitor for IgG4 result.        SUBJECTIVE   Patient states he is on Dilaudid p.o., getting every 4 hours, and states he is due at this time but feels he does not need the pain medication. Is on heart healthy diet not on any IV fluids at this time. He did have one episode of vomiting about 1-2 hours after eating, had chicken salad sandwich, was 5-10 mins after drinking miralax. Has had 2 BM's this AM, loose, no melena and no hematochezia.  He is up and walking the  halls.  OBJECTIVE     Scheduled inpatient medications:   enoxaparin (LOVENOX) injection  40 mg Subcutaneous Q24H   ondansetron (ZOFRAN) IV  4 mg Intravenous BID   pantoprazole  40 mg Oral BID   polyethylene glycol  17 g Oral BID   senna-docusate  1 tablet Oral BID   Continuous inpatient infusions:    PRN inpatient medications: acetaminophen **OR** acetaminophen, alum & mag hydroxide-simeth, bisacodyl, HYDROmorphone (DILAUDID) injection, hydrOXYzine, LORazepam, ondansetron **OR** ondansetron (ZOFRAN) IV, oxyCODONE, polyvinyl alcohol  Vital signs in last 24 hours: Temp:  [97.7 F (36.5 C)-97.9 F (36.6 C)] 97.7 F (36.5 C) (12/19 0336) Pulse Rate:  [62-82] 62 (12/19 0336) Resp:  [18-20] 20 (12/19 0336) BP: (122-128)/(75-98) 122/75 (12/19 0336) SpO2:  [93 %-97 %] 97 % (12/19 0336) Last BM Date: 02/11/21 No intake or output data in the 24 hours ending 02/12/21 0858    Physical Exam:  General: Alert male in NAD Heart:  Regular rate and rhythm. No lower extremity edema Pulmonary: Normal respiratory effort Abdomen: Soft, nondistended, nontender. Normal bowel sounds.  Neurologic: Alert and oriented Psych: Pleasant. Cooperative.   Filed Weights   02/04/21 1434  Weight: 111.1 kg    Intake/Output from previous day: No intake/output data recorded. Intake/Output this shift: No intake/output data recorded.   Lab Results: Recent Labs    02/10/21 0542 02/12/21 0501  WBC 8.6 7.5  HGB 14.3 15.4  HCT 43.0 45.7  PLT 462* 510*   BMET Recent  Labs    02/10/21 1528 02/11/21 0521 02/12/21 0501  NA 135 136 137  K 3.8 4.2 4.2  CL 98 98 100  CO2 24 27 28   GLUCOSE 103* 104* 106*  BUN 12 12 16   CREATININE 1.08 1.02 1.14  CALCIUM 9.3 9.2 9.3   LFT Recent Labs    02/12/21 0501  PROT 8.3*  ALBUMIN 4.2  AST 48*  ALT 92*  ALKPHOS 81  BILITOT 0.8     LOS: 7 days   Vladimir Crofts ,NP 02/12/2021, 8:58 AM

## 2021-02-12 NOTE — Plan of Care (Signed)
°  Problem: Education: Goal: Knowledge of Pancreatitis treatment and prevention will improve Outcome: Adequate for Discharge   Problem: Health Behavior/Discharge Planning: Goal: Ability to formulate a plan to maintain an alcohol-free life will improve Outcome: Adequate for Discharge   Problem: Nutritional: Goal: Ability to achieve adequate nutritional intake will improve 02/12/2021 1732 by Sharene Butters, RN Outcome: Adequate for Discharge 02/12/2021 1442 by Sharene Butters, RN Outcome: Progressing   Problem: Clinical Measurements: Goal: Complications related to the disease process, condition or treatment will be avoided or minimized Outcome: Adequate for Discharge

## 2021-02-12 NOTE — Telephone Encounter (Signed)
Pt has been scheduled for a hospital follow-up visit with DJ on 04/04/21 at 10:50 am. Pt is currently admitted to [facility], and appointment instructions will be given at discharge. A letter has also been sent via [place].

## 2021-02-12 NOTE — Plan of Care (Signed)
°  Problem: Nutritional: Goal: Ability to achieve adequate nutritional intake will improve Outcome: Progressing

## 2021-02-12 NOTE — Discharge Summary (Signed)
Physician Discharge Summary  Andre Simmons BTD:[id number] DOB: 03-05-1975 DOA: 02/04/2021  PCP: Maryella Shivers, MD  Admit date: 02/04/2021 Discharge date: 02/12/2021  Admitted From: Home Disposition: Home  Recommendations for Outpatient Follow-up:  Follow up with PCP in 1-2 weeks Follow-up with gastroenterology within 1 to 2 weeks Please obtain BMP/CBC in one week Please follow up on the following pending results: IgG4 level  Home Health: No Equipment/Devices: None  Discharge Condition: Stable CODE STATUS: Full code Diet recommendation: Heart healthy soft diet  Brief/Interim Summary: The patient is a 45 year old obese Caucasian male with a past medical history significant for but #2 neuroendocrine pancreatic tumor status postresection of the tail of the pancreas, history of splenectomy 2018 as well as recurrent pancreatitis presents with 1 week history of abdominal pain.  He is complained of epigastric pain similar to episodes of pancreatitis.  He had nausea vomiting with decreased p.o. intake.  Lipase was elevated 52.  CT of the abdomen pelvis was done and showed mild acute pancreatitis.  He was admitted for further management of his acute on chronic pancreatitis and GIs been consulted.  He remained on a clear liquid diet yesterday with no real improvement so GI was consulted and they ordered an MRCP MRI of the pancreas MRI which showed mild acute pancreatitis.  He is feeling little bit better today and continues to take round-the-clock pain medication.  His diet was advanced to full liquid diet yesterday and GI advance it to a heart healthy diet yesterday however he continues to have significant nausea and pain today.  GI ordered an IgG4 and they feel that the patient may benefit from referral to a tertiary med center as an outpatient.  They are recommending continue heart healthy diet for now and do small frequent meals and patient was encouraged to ambulate.  Patient has not had a  bowel movement since Friday so he was initiated on bowel regimen started on MiraLAX and will change his other laxative to senna docusate.   Continues to be nauseous and continues to have significant abdominal pain.  Tolerating his meals low and eating solid foods.  We cannot safely discharge him until he is off of IV medications so we will change him to p.o. oxycodone.  Given his persistent nausea GI has recommended scheduled twice daily Zofran and this has been initiated and we have also added IV as needed Compazine but patient had an adverse effect to IV Compazine.  We will initiate a p.o. pain regimen and patient is starting that and starting to wean off of IV pain medication.  We will cut back on the frequency of his IVs and continue p.o.'s and assess whether patient can be discharged home.  His Dilaudid was weaned back to every 4 hours and cut to 1 mg and he was taken off the IV scheduled Zofran he did well.  His pain was able to be controlled with oral analgesics as well as his nausea.  He was deemed stable to be discharged to follow-up with PCP as well as gastroenterology in outpatient setting  Discharge Diagnoses:  Principal Problem:   Acute pancreatitis Active Problems:   Neuroendocrine tumor of pancreas  Acute pancreatitis and a history of patient with chronic pancreatitis -Has had a history of recurrent pancreatitis since his pancreatic surgery and was n.p.o. yesterday and placed on a clear liquid diet and was attempted to go to full liquid diet this morning but because patient had not tolerated clear liquid diet will cut back  to clear liquid diet -We will continue supportive care and IV fluid hydration with lactated Ringer's at 125 MLS per hour and continue for now -Continue with antiemetics with ondansetron 4 mg p.o./IV every 6 as needed nausea and now GI recommended scheduling Zofran twice daily which we have done; he states that his nausea is improving and his pain was a 6/10  today -Lipase level went from 52 and trended down to 31 but patient still having pain and nausea which is persistent -Continuing pantoprazole 40 mg p.o. twice daily -Continue with pain control with hydromorphone 1.5 mg but change from every 3 hours as needed to every 4 hours as needed for severe pain and will add p.o. pain control with p.o. oxycodone every 6 hours as needed.  Recommended to take 5-10 mg of p.o. oxycodone first -CT scan of the abdomen pelvis showed "Mild acute uncomplicated pancreatitis involving the pancreatic head. No fluid collection, pseudocyst, or abscess. Postsurgical changes from distal pancreatectomy and splenectomy. Hepatic steatosis, with areas of focal fatty sparing near the gallbladder fossa. Aortic Atherosclerosis." -GI has been consulted for further evaluation recommendations and they ordered an MRI of the abdomen with and without contrast -MRI of the pancreas showed mild acute pancreatitis without evidence of complication and did show status post distal pancreatectomy with near complete resolution of prior surgical seroma along the surgical margins with no findings suspicious for recurrence.  There is no evidence of intrahepatic or extrahepatic ductal dilatation noted no cholelithiasis or choledocholithiasis on MRI but did show the patient has severe hepatic steatosis -Patient is now on a soft diet -GI has ordered an IgG4 which is pending and they feel that they patient would benefit from an outpatient referral to a tertiary care center.  Patient is now stable to be discharged home as his pain is controlled with oral analgesics as well as his nausea.   History of neuroendocrine tumor of the tail of the pancreas -He is also had associated resection of the spleen in 2018 -Need to follow-up with oncology in outpatient setting -CT scan of abdomen pelvis as above did not show any evidence of any tumor recurrence   Abnormal LFTs n the setting of Hepatic Steatosis -In the  setting of pancreatitis -Patient's ALT trend showed ALT of 62 -> 52 -> 42 -> 50 -> 52 -> 69 -> 68 -> 88 -> 89 and is now 92 -Patient's AST is now worsened and gone from 21 is now 46 -> 39 -> 54 -> 54 and is now 48 -Continue to monitor and trend and if necessary will obtain further imaging with a right upper quadrant ultrasound -GI is following and defer to them for further work-up and they will follow-up with the patient in outpatient setting and have them work this up further   Leukocytosis -Mild and likely reactive in the setting of pancreatitis -WBC is trended down and normalized as it went from 16.1 -> 14.2 -> 11.5 -> 10.3 -> 9.1 -> 7.4 -> 8.6 and is now 7.5 -Continue to monitor for signs and symptoms of infection -Repeat CBC in a.m.   New onset Diabetes Mellitus Type 2 -Patient's hemoglobin A1c was 6.7 -Continue monitor CBGs carefully and if necessary will place on sensitive NovoLog/scale insulin AC -He will benefit from low-dose metformin once he is at discharge or he can follow-up and discuss with PCP for further management -CBGs ranging from 81-123; blood sugar on a.m. CMP was 102 -Follow-up the PCP in the outpatient setting   Hyperlipidemia/elevated  LDL -Patient is lipid panel done and showed a total cholesterol/HDL ratio of 4.7, cholesterol level 161, HDL 34, LDL 113, triglycerides of 71, and VLDL 14 -Immediate indication for treatment as an inpatient and can follow-up with the PCP in outpatient setting to be placed on statin   Constipation -Added MiraLAX 17 g p.o. twice daily and will change his docusate sodium 100 mg daily as needed to senna docusate scheduled 1 tab p.o. twice daily and also add a bisacodyl suppository 10 mg rectally daily as needed - had a bowel movement now finally   Thrombocytosis -Mild and likely reactive in the setting of above -Patient's platelet count went from 467 -> 469 -> 447 -> 459 -> 469 -> 462 x2 on last check and went to 510 -Continue to  monitor and trend and repeat CBC in a.m.   Obesity -Complicates overall prognosis and care -Estimated body mass index is 30.62 kg/m as calculated from the following:   Height as of this encounter: 6\' 3"  (1.905 m).   Weight as of this encounter: 111.1 kg.  -Weight Loss and Dietary Counseling given  Discharge Instructions  Discharge Instructions     Call MD for:  difficulty breathing, headache or visual disturbances   Complete by: As directed    Call MD for:  extreme fatigue   Complete by: As directed    Call MD for:  hives   Complete by: As directed    Call MD for:  persistant dizziness or light-headedness   Complete by: As directed    Call MD for:  persistant nausea and vomiting   Complete by: As directed    Call MD for:  redness, tenderness, or signs of infection (pain, swelling, redness, odor or green/yellow discharge around incision site)   Complete by: As directed    Call MD for:  severe uncontrolled pain   Complete by: As directed    Call MD for:  temperature >100.4   Complete by: As directed    Diet - low sodium heart healthy   Complete by: As directed    SOFT Low Fat Diet   Discharge instructions   Complete by: As directed    You were cared for by a hospitalist during your hospital stay. If you have any questions about your discharge medications or the care you received while you were in the hospital after you are discharged, you can call the unit and ask to speak with the hospitalist on call if the hospitalist that took care of you is not available. Once you are discharged, your primary care physician will handle any further medical issues. Please note that NO REFILLS for any discharge medications will be authorized once you are discharged, as it is imperative that you return to your primary care physician (or establish a relationship with a primary care physician if you do not have one) for your aftercare needs so that they can reassess your need for medications and  monitor your lab values.  Follow up with PCP and Gastroenterology within 1-2 weeks. Take all medications as prescribed. If symptoms change or worsen please return to the ED for evaluation   Increase activity slowly   Complete by: As directed       Allergies as of 02/12/2021       Reactions   Adhesive [tape] Rash, Other (See Comments)   Skin irritation.        Medication List     TAKE these medications    acetaminophen 325 MG  tablet Commonly known as: TYLENOL Take 2 tablets (650 mg total) by mouth every 6 (six) hours as needed for mild pain (or Fever >/= 101).   albuterol 108 (90 Base) MCG/ACT inhaler Commonly known as: VENTOLIN HFA Inhale 1-2 puffs into the lungs See admin instructions. Every 4 to 6 hours as needed for wheezing/shortness of breath   ibuprofen 200 MG tablet Commonly known as: ADVIL Take 400 mg by mouth every 6 (six) hours as needed for mild pain.   MENS MULTIPLUS PO Take 1 tablet by mouth daily.   omeprazole 40 MG capsule Commonly known as: PRILOSEC Take 40 mg by mouth 2 (two) times daily.   ondansetron 4 MG tablet Commonly known as: ZOFRAN Take 1 tablet (4 mg total) by mouth every 6 (six) hours as needed for nausea.   oxyCODONE 5 MG immediate release tablet Commonly known as: Oxy IR/ROXICODONE Take 1 tablet (5 mg total) by mouth every 6 (six) hours as needed for moderate pain or severe pain.   polyethylene glycol 17 g packet Commonly known as: MIRALAX / GLYCOLAX Take 17 g by mouth daily as needed for mild constipation.        Allergies  Allergen Reactions   Adhesive [Tape] Rash and Other (See Comments)    Skin irritation.    Consultations: Gastroenterology  Procedures/Studies: MR ABDOMEN W WO CONTRAST  Result Date: 02/07/2021 CLINICAL DATA:  Recurrent pancreatitis. History of pancreatic neuroendocrine tumor. EXAM: MRI ABDOMEN WITHOUT AND WITH CONTRAST TECHNIQUE: Multiplanar multisequence MR imaging of the abdomen was performed  both before and after the administration of intravenous contrast. CONTRAST:  13mL GADAVIST GADOBUTROL 1 MMOL/ML IV SOLN COMPARISON:  CT abdomen/pelvis dated 02/04/2021. MRI abdomen dated 06/25/2018. FINDINGS: Lower chest: Lung bases are clear. Hepatobiliary: Severe hepatic steatosis. Liver is otherwise within normal limits. No suspicious/enhancing hepatic lesions. Gallbladder is unremarkable. No intrahepatic or extrahepatic ductal dilatation. Common duct measures 3 mm. No choledocholithiasis is seen. Pancreas: Status post distal pancreatectomy. Tiny postsurgical seroma along the surgical margin (series 4/image 17), measuring 11 mm, improved from prior MRI (previously 3.9 cm). Peripancreatic inflammatory changes along the pancreatic head/uncinate process and pancreaticoduodenal groove (series 4/image 23), reflecting acute pancreatitis. No drainable fluid collection/walled-off necrosis. Spleen:  Surgically absent. Adrenals/Urinary Tract:  Adrenal glands are within normal limits. Subcentimeter right upper pole renal cyst. Left kidney is within normal limits. No hydronephrosis. Stomach/Bowel: Stomach is within normal limits. Visualized bowel is unremarkable. Vascular/Lymphatic:  No evidence of abdominal aortic aneurysm. Small upper abdominal lymph nodes measuring up to 9 mm short axis, likely reactive. Other:  No abdominal ascites. Musculoskeletal: No focal osseous lesions. IMPRESSION: Mild acute pancreatitis, without evidence of complication. Status post distal pancreatectomy. Near complete resolution of prior postsurgical seroma along the surgical margin. No findings suspicious for recurrence. No intrahepatic or extrahepatic ductal dilatation. No cholelithiasis or choledocholithiasis on MR. Severe hepatic steatosis. Electronically Signed   By: Julian Hy M.D.   On: 02/07/2021 23:06   CT ABDOMEN PELVIS W CONTRAST  Result Date: 02/04/2021 CLINICAL DATA:  Pancreatitis, abdominal pain for 1 week EXAM: CT  ABDOMEN AND PELVIS WITH CONTRAST TECHNIQUE: Multidetector CT imaging of the abdomen and pelvis was performed using the standard protocol following bolus administration of intravenous contrast. CONTRAST:  115mL OMNIPAQUE IOHEXOL 300 MG/ML  SOLN COMPARISON:  06/25/2018, 10/10/2019 FINDINGS: Lower chest: No acute pleural or parenchymal lung disease. Hepatobiliary: Diffuse hepatic steatosis, with persistent focal fatty sparing near the gallbladder fossa. No biliary duct dilation. The gallbladder is unremarkable. Pancreas: Postsurgical  changes from distal pancreatectomy. There is mild edema and peripancreatic fat stranding within the pancreatic head, consistent with acute uncomplicated pancreatitis. No fluid collection, pseudocyst, or abscess. Spleen: Surgically absent. Adrenals/Urinary Tract: Adrenal glands are unremarkable. Kidneys are normal, without renal calculi, focal lesion, or hydronephrosis. Bladder is unremarkable. Stomach/Bowel: No bowel obstruction or ileus. Normal appendix right lower quadrant. No bowel wall thickening or inflammatory change. Vascular/Lymphatic: Aortic atherosclerosis. No enlarged abdominal or pelvic lymph nodes. Reproductive: Prostate is unremarkable. Other: No free fluid or free gas.  No abdominal wall hernia. Musculoskeletal: No acute or destructive bony lesions. Reconstructed images demonstrate no additional findings. IMPRESSION: 1. Mild acute uncomplicated pancreatitis involving the pancreatic head. No fluid collection, pseudocyst, or abscess. 2. Postsurgical changes from distal pancreatectomy and splenectomy. 3. Hepatic steatosis, with areas of focal fatty sparing near the gallbladder fossa. 4.  Aortic Atherosclerosis (ICD10-I70.0). Electronically Signed   By: Randa Ngo M.D.   On: 02/04/2021 16:26   MR 3D Recon At Scanner  Result Date: 02/08/2021 CLINICAL DATA:  Recurrent pancreatitis. History of pancreatic neuroendocrine tumor. EXAM: MRI ABDOMEN WITHOUT AND WITH CONTRAST  TECHNIQUE: Multiplanar multisequence MR imaging of the abdomen was performed both before and after the administration of intravenous contrast. CONTRAST:  55mL GADAVIST GADOBUTROL 1 MMOL/ML IV SOLN COMPARISON:  CT abdomen/pelvis dated 02/04/2021. MRI abdomen dated 06/25/2018. FINDINGS: Lower chest: Lung bases are clear. Hepatobiliary: Severe hepatic steatosis. Liver is otherwise within normal limits. No suspicious/enhancing hepatic lesions. Gallbladder is unremarkable. No intrahepatic or extrahepatic ductal dilatation. Common duct measures 3 mm. No choledocholithiasis is seen. Pancreas: Status post distal pancreatectomy. Tiny postsurgical seroma along the surgical margin (series 4/image 17), measuring 11 mm, improved from prior MRI (previously 3.9 cm). Peripancreatic inflammatory changes along the pancreatic head/uncinate process and pancreaticoduodenal groove (series 4/image 23), reflecting acute pancreatitis. No drainable fluid collection/walled-off necrosis. Spleen:  Surgically absent. Adrenals/Urinary Tract:  Adrenal glands are within normal limits. Subcentimeter right upper pole renal cyst. Left kidney is within normal limits. No hydronephrosis. Stomach/Bowel: Stomach is within normal limits. Visualized bowel is unremarkable. Vascular/Lymphatic:  No evidence of abdominal aortic aneurysm. Small upper abdominal lymph nodes measuring up to 9 mm short axis, likely reactive. Other:  No abdominal ascites. Musculoskeletal: No focal osseous lesions. IMPRESSION: Mild acute pancreatitis, without evidence of complication. Status post distal pancreatectomy. Near complete resolution of prior postsurgical seroma along the surgical margin. No findings suspicious for recurrence. No intrahepatic or extrahepatic ductal dilatation. No cholelithiasis or choledocholithiasis on MR. Severe hepatic steatosis. Electronically Signed   By: Julian Hy M.D.   On: 02/08/2021 08:14   DG CHEST PORT 1 VIEW  Result Date:  02/10/2021 CLINICAL DATA:  Shortness of breath.  Left lower chest pain. EXAM: PORTABLE CHEST 1 VIEW COMPARISON:  August 16, 2016 FINDINGS: The heart size and mediastinal contours are within normal limits. Both lungs are clear. The visualized skeletal structures are unremarkable. IMPRESSION: No active disease. Electronically Signed   By: Dorise Bullion III M.D.   On: 02/10/2021 16:48     Subjective: Seen and examined at bedside and he was doing quite well.  He denies any chest pain shortness of breath.  His nausea has improved as well as his abdominal pain.  He states it is about a 5 or 6 out of 10.  His abdominal pain is better controlled with oral analgesics.  He is able to tolerate his diet without issues and his nausea is improved significantly.  He is stable to be discharged home at this time  and follow-up with PCP and gastroenterology in outpatient setting and will follow up with them for his abnormal LFTs which are slightly elevated.  Discharge Exam: Vitals:   02/12/21 0336 02/12/21 1529  BP: 122/75 128/87  Pulse: 62 79  Resp: 20 20  Temp: 97.7 F (36.5 C) 97.6 F (36.4 C)  SpO2: 97% 98%   Vitals:   02/11/21 1400 02/11/21 2039 02/12/21 0336 02/12/21 1529  BP: (!) 128/98 122/84 122/75 128/87  Pulse: 82 71 62 79  Resp: 18 20 20 20   Temp: 97.7 F (36.5 C) 97.9 F (36.6 C) 97.7 F (36.5 C) 97.6 F (36.4 C)  TempSrc: Oral Oral Oral Oral  SpO2: 93% 96% 97% 98%  Weight:      Height:       General: Pt is alert, awake, not in acute distress Cardiovascular: RRR, S1/S2 +, no rubs, no gallops Respiratory: Diminished bilaterally, no wheezing, no rhonchi; unlabored breathing Abdominal: Soft, mildly tender, distended secondary habitus, bowel sounds + Extremities: no edema, no cyanosis  The results of significant diagnostics from this hospitalization (including imaging, microbiology, ancillary and laboratory) are listed below for reference.    Microbiology: Recent Results (from the  past 240 hour(s))  Resp Panel by RT-PCR (Flu A&B, Covid) Nasopharyngeal Swab     Status: None   Collection Time: 02/04/21  7:06 PM   Specimen: Nasopharyngeal Swab; Nasopharyngeal(NP) swabs in vial transport medium  Result Value Ref Range Status   SARS Coronavirus 2 by RT PCR NEGATIVE NEGATIVE Final    Comment: (NOTE) SARS-CoV-2 target nucleic acids are NOT DETECTED.  The SARS-CoV-2 RNA is generally detectable in upper respiratory specimens during the acute phase of infection. The lowest concentration of SARS-CoV-2 viral copies this assay can detect is 138 copies/mL. A negative result does not preclude SARS-Cov-2 infection and should not be used as the sole basis for treatment or other patient management decisions. A negative result may occur with  improper specimen collection/handling, submission of specimen other than nasopharyngeal swab, presence of viral mutation(s) within the areas targeted by this assay, and inadequate number of viral copies(<138 copies/mL). A negative result must be combined with clinical observations, patient history, and epidemiological information. The expected result is Negative.  Fact Sheet for Patients:  EntrepreneurPulse.com.au  Fact Sheet for Healthcare Providers:  IncredibleEmployment.be  This test is no t yet approved or cleared by the [place] FDA and  has been authorized for detection and/or diagnosis of SARS-CoV-2 by FDA under an Emergency Use Authorization (EUA). This EUA will remain  in effect (meaning this test can be used) for the duration of the COVID-19 declaration under Section 564(b)(1) of the Act, 21 U.S.C.section 360bbb-3(b)(1), unless the authorization is terminated  or revoked sooner.       Influenza A by PCR NEGATIVE NEGATIVE Final   Influenza B by PCR NEGATIVE NEGATIVE Final    Comment: (NOTE) The Xpert Xpress SARS-CoV-2/FLU/RSV plus assay is intended as an aid in the diagnosis of  influenza from Nasopharyngeal swab specimens and should not be used as a sole basis for treatment. Nasal washings and aspirates are unacceptable for Xpert Xpress SARS-CoV-2/FLU/RSV testing.  Fact Sheet for Patients: EntrepreneurPulse.com.au  Fact Sheet for Healthcare Providers: IncredibleEmployment.be  This test is not yet approved or cleared by the [place] FDA and has been authorized for detection and/or diagnosis of SARS-CoV-2 by FDA under an Emergency Use Authorization (EUA). This EUA will remain in effect (meaning this test can be used) for the duration of the  COVID-19 declaration under Section 564(b)(1) of the Act, 21 U.S.C. section 360bbb-3(b)(1), unless the authorization is terminated or revoked.  Performed at [facility], [address] [place], [place] [address]     Labs: BNP (last 3 results) No results for input(s): BNP in the last 8760 hours. Basic Metabolic Panel: Recent Labs  Lab 02/08/21 0459 02/09/21 0521 02/10/21 0542 02/10/21 1528 02/11/21 0521 02/12/21 0501  NA 136 137 134* 135 136 137  K 3.8 4.0 3.7 3.8 4.2 4.2  CL 97* 101 98 98 98 100  CO2 28 30 27 24 27 28   GLUCOSE 81 100* 102* 103* 104* 106*  BUN 11 12 12 12 12 16   CREATININE 1.10 1.05 0.98 1.08 1.02 1.14  CALCIUM 9.0 8.7* 8.6* 9.3 9.2 9.3  MG 1.8 2.1 2.0  --  2.3 2.2  PHOS 3.4 3.7 3.1  --  3.7 4.1   Liver Function Tests: Recent Labs  Lab 02/09/21 0521 02/10/21 0542 02/10/21 1528 02/11/21 0521 02/12/21 0501  AST 46* 39 54* 54* 48*  ALT 69* 68* 88* 89* 92*  ALKPHOS 78 76 87 82 81  BILITOT 0.6 0.5 0.8 0.7 0.8  PROT 7.4 7.5 8.6* 8.1 8.3*  ALBUMIN 3.8 3.9 4.5 4.2 4.2   Recent Labs  Lab 02/06/21 0514  LIPASE 31   No results for input(s): AMMONIA in the last 168 hours. CBC: Recent Labs  Lab 02/07/21 0529 02/08/21 0459 02/09/21 0521 02/10/21 0542 02/12/21 0501  WBC 10.3 9.1 7.4 8.6 7.5  NEUTROABS 4.7 4.1 3.3 4.1 3.4  HGB  15.0 13.7 14.5 14.3 15.4  HCT 45.3 41.5 43.2 43.0 45.7  MCV 96.8 96.1 96.0 96.0 94.6  PLT 459* 469* 462* 462* 510*   Cardiac Enzymes: No results for input(s): CKTOTAL, CKMB, CKMBINDEX, TROPONINI in the last 168 hours. BNP: Invalid input(s): POCBNP CBG: No results for input(s): GLUCAP in the last 168 hours. D-Dimer No results for input(s): DDIMER in the last 72 hours. Hgb A1c No results for input(s): HGBA1C in the last 72 hours. Lipid Profile No results for input(s): CHOL, HDL, LDLCALC, TRIG, CHOLHDL, LDLDIRECT in the last 72 hours. Thyroid function studies No results for input(s): TSH, T4TOTAL, T3FREE, THYROIDAB in the last 72 hours.  Invalid input(s): FREET3 Anemia work up No results for input(s): VITAMINB12, FOLATE, FERRITIN, TIBC, IRON, RETICCTPCT in the last 72 hours. Urinalysis    Component Value Date/Time   COLORURINE YELLOW 11/21/2017 0835   APPEARANCEUR CLEAR 11/21/2017 0835   LABSPEC 1.026 11/21/2017 0835   PHURINE 5.0 11/21/2017 0835   GLUCOSEU NEGATIVE 11/21/2017 0835   HGBUR NEGATIVE 11/21/2017 0835   BILIRUBINUR NEGATIVE 11/21/2017 0835   KETONESUR NEGATIVE 11/21/2017 0835   PROTEINUR NEGATIVE 11/21/2017 0835   UROBILINOGEN 0.2 09/19/2009 1400   NITRITE NEGATIVE 11/21/2017 0835   LEUKOCYTESUR NEGATIVE 11/21/2017 0835   Sepsis Labs Invalid input(s): PROCALCITONIN,  WBC,  LACTICIDVEN Microbiology Recent Results (from the past 240 hour(s))  Resp Panel by RT-PCR (Flu A&B, Covid) Nasopharyngeal Swab     Status: None   Collection Time: 02/04/21  7:06 PM   Specimen: Nasopharyngeal Swab; Nasopharyngeal(NP) swabs in vial transport medium  Result Value Ref Range Status   SARS Coronavirus 2 by RT PCR NEGATIVE NEGATIVE Final    Comment: (NOTE) SARS-CoV-2 target nucleic acids are NOT DETECTED.  The SARS-CoV-2 RNA is generally detectable in upper respiratory specimens during the acute phase of infection. The lowest concentration of SARS-CoV-2 viral copies this  assay can detect is  138 copies/mL. A negative result does not preclude SARS-Cov-2 infection and should not be used as the sole basis for treatment or other patient management decisions. A negative result may occur with  improper specimen collection/handling, submission of specimen other than nasopharyngeal swab, presence of viral mutation(s) within the areas targeted by this assay, and inadequate number of viral copies(<138 copies/mL). A negative result must be combined with clinical observations, patient history, and epidemiological information. The expected result is Negative.  Fact Sheet for Patients:  EntrepreneurPulse.com.au  Fact Sheet for Healthcare Providers:  IncredibleEmployment.be  This test is no t yet approved or cleared by the [place] FDA and  has been authorized for detection and/or diagnosis of SARS-CoV-2 by FDA under an Emergency Use Authorization (EUA). This EUA will remain  in effect (meaning this test can be used) for the duration of the COVID-19 declaration under Section 564(b)(1) of the Act, 21 U.S.C.section 360bbb-3(b)(1), unless the authorization is terminated  or revoked sooner.       Influenza A by PCR NEGATIVE NEGATIVE Final   Influenza B by PCR NEGATIVE NEGATIVE Final    Comment: (NOTE) The Xpert Xpress SARS-CoV-2/FLU/RSV plus assay is intended as an aid in the diagnosis of influenza from Nasopharyngeal swab specimens and should not be used as a sole basis for treatment. Nasal washings and aspirates are unacceptable for Xpert Xpress SARS-CoV-2/FLU/RSV testing.  Fact Sheet for Patients: EntrepreneurPulse.com.au  Fact Sheet for Healthcare Providers: IncredibleEmployment.be  This test is not yet approved or cleared by the [place] FDA and has been authorized for detection and/or diagnosis of SARS-CoV-2 by FDA under an Emergency Use Authorization (EUA). This EUA will  remain in effect (meaning this test can be used) for the duration of the COVID-19 declaration under Section 564(b)(1) of the Act, 21 U.S.C. section 360bbb-3(b)(1), unless the authorization is terminated or revoked.  Performed at [facility], [address] [place], [place] [address]    Time coordinating discharge: 35 minutes  SIGNED:  Kerney Elbe, DO [facility] 02/12/2021, 7:08 PM Pager is on [place]  If 7PM-7AM, please contact night-coverage [email or web address]

## 2021-02-12 NOTE — Care Management Important Message (Signed)
Important Message  Patient Details IM Letter given to the Patient. Name: Andre Simmons MRN: [id number] Date of Birth: 10/14/1975   Medicare Important Message Given:  Yes     Kerin Salen 02/12/2021, 2:17 PM

## 2021-02-12 NOTE — Telephone Encounter (Signed)
-----   Message from Milus Banister, MD sent at 02/10/2021  9:33 AM EST ----- He is going home from [facility] in next few days, needs OV with me, my next available for hosp follow up.  Thanks

## 2021-02-13 ENCOUNTER — Telehealth: Payer: Self-pay | Admitting: Genetic Counselor

## 2021-02-13 NOTE — Telephone Encounter (Signed)
Scheduled appt per 12/19 referral. Pt is aware of appt date and time.  °

## 2021-02-15 ENCOUNTER — Other Ambulatory Visit: Payer: Self-pay

## 2021-02-15 ENCOUNTER — Telehealth: Payer: Self-pay | Admitting: Gastroenterology

## 2021-02-15 DIAGNOSIS — K859 Acute pancreatitis without necrosis or infection, unspecified: Secondary | ICD-10-CM

## 2021-02-15 NOTE — Progress Notes (Signed)
Spoke with pt regarding lab results. Orders are in for him to have the IgG4 repeated, per DJ. Pt stated he would try to come in after the holidays are over. No further questions.

## 2021-02-15 NOTE — Telephone Encounter (Signed)
Patient requesting results from blood work, Please advise.

## 2021-03-14 ENCOUNTER — Inpatient Hospital Stay: Payer: Medicare HMO

## 2021-03-14 ENCOUNTER — Encounter: Payer: Self-pay | Admitting: Genetic Counselor

## 2021-03-14 ENCOUNTER — Other Ambulatory Visit: Payer: Self-pay | Admitting: Genetic Counselor

## 2021-03-14 ENCOUNTER — Inpatient Hospital Stay: Payer: Medicare HMO | Attending: Genetic Counselor | Admitting: Genetic Counselor

## 2021-03-14 DIAGNOSIS — Z800 Family history of malignant neoplasm of digestive organs: Secondary | ICD-10-CM | POA: Diagnosis not present

## 2021-03-14 DIAGNOSIS — Z803 Family history of malignant neoplasm of breast: Secondary | ICD-10-CM

## 2021-03-14 DIAGNOSIS — D3A8 Other benign neuroendocrine tumors: Secondary | ICD-10-CM

## 2021-03-14 DIAGNOSIS — K858 Other acute pancreatitis without necrosis or infection: Secondary | ICD-10-CM | POA: Diagnosis not present

## 2021-03-14 LAB — GENETIC SCREENING ORDER

## 2021-03-14 NOTE — Progress Notes (Signed)
REFERRING PROVIDER: Milus Banister, MD [address],  [place] [address]  PRIMARY PROVIDER:  Maryella Shivers, MD  PRIMARY REASON FOR VISIT:  1. Family history of pancreatic cancer   2. Family history of breast cancer   3. Neuroendocrine tumor of pancreas   4. Other acute pancreatitis without infection or necrosis      HISTORY OF PRESENT ILLNESS:   Mr. Beske, a 46 y.o. male, was seen for a [place] cancer genetics consultation at the request of Dr. Ardis Hughs due to a personal history of pancreatic neuroendocrine tumor and family history of pancreatic and breast cancer.  Mr. Swift presents to clinic today to discuss the possibility of a hereditary predisposition to cancer, genetic testing, and to further clarify his future cancer risks, as well as potential cancer risks for family members.   In 2019, at the age of 46, Mr. Pilar was diagnosed with neuroendocrine tumor of the pancreas. The treatment plan included removing his spleen and a portion of his pancreas.  Since this time, he has had recurrent pancreatitis.  This has hospitalized him at least 4 times.     CANCER HISTORY:  Oncology History   No history exists.    Past Medical History:  Diagnosis Date   Arthritis    Chronic back pain    Depression    Family history of breast cancer    Family history of pancreatic cancer    GERD (gastroesophageal reflux disease)    Knee pain    Pancreatic tumor     Past Surgical History:  Procedure Laterality Date   BACK SURGERY  03/2018   Dr Trenton Gammon.  L4-L5, S1 micro discectomy   EUS N/A 09/25/2017   Procedure: UPPER ENDOSCOPIC ULTRASOUND (EUS) LINEAR;  Surgeon: Milus Banister, MD;  Location: [facility];  Service: Endoscopy;  Laterality: N/A;  Radial and Linear   FINE NEEDLE ASPIRATION  09/25/2017   Procedure: FINE NEEDLE ASPIRATION (FNA) RADIAL;  Surgeon: Milus Banister, MD;  Location: [facility];  Service: Endoscopy;;   KNEE SURGERY Left    x 20 surgeries    LAPAROSCOPIC DISTAL PANCREATECTOMY  11/26/2017   Diagnostic laparoscopy, laparoscopic hand-assisted distal    MOUTH SURGERY     as a child, unsure if wisdom teeth or tonsils   PANCREATECTOMY N/A 11/25/2017   Procedure: LAPAROSCOPIC DISTAL PANCREATECTOMY AND SPLENECTOMY ERAS PATHWAY;  Surgeon: Stark Klein, MD;  Location: [facility];  Service: General;  Laterality: N/A;   Removal of Spleen  2019    Social History   Socioeconomic History   Marital status: Single    Spouse name: Not on file   Number of children: 2   Years of education: Not on file   Highest education level: Not on file  Occupational History   Occupation: Disabled  Tobacco Use   Smoking status: Former   Smokeless tobacco: Never   Tobacco comments:    Quit 14-15 years ago (as of 2019)  Vaping Use   Vaping Use: Never used  Substance and Sexual Activity   Alcohol use: No   Drug use: Yes    Types: Psilocybin   Sexual activity: Not on file  Other Topics Concern   Not on file  Social History Narrative   Not on file   Social Determinants of Health   Financial Resource Strain: Not on file  Food Insecurity: Not on file  Transportation Needs: Not on file  Physical Activity: Not on file  Stress: Not on file  Social Connections: Not on file     FAMILY HISTORY:  We obtained a detailed, 4-generation family history.  Significant diagnoses are listed below: Family History  Problem Relation Age of Onset   Thyroid cancer Mother 90       papillary   Other Father        MVA at age 70   Lung cancer Maternal Aunt    Pancreatic cancer Maternal Uncle    Esophageal cancer Maternal Grandmother 80       smoker   Breast cancer Maternal Grandmother 17   Esophageal cancer Maternal Grandfather 80       smoker   Esophageal cancer Paternal Grandmother    Healthy Half-Sister    Colon cancer Neg Hx    Rectal cancer Neg Hx     The patient has two children who are cancer free.  He has a maternal half sister who is cancer free.   His father is deceased and his mother is living.  The patient's father died in a car accident at age 34.  He had seven siblings, none the patient is aware of that had cancer.  The paternal grandparents are deceased.  The patient's mother had papillary thyroid cancer at age 60.  She has two sisters and two brothers.  One brother had pancreatic cancer and one sister had lung cancer.  The maternal grandparents are deceased.  They both had esophageal cancer, but his grandmother also had breast cancer at age 83.  Mr. Schoenberger is unaware of previous family history of genetic testing for hereditary cancer risks. Patient's maternal ancestors are of [place], [place], [place] and [place] descent, and paternal ancestors are of [place], [place] and [place] descent. There is no reported Ashkenazi Jewish ancestry. There is no known consanguinity.  GENETIC COUNSELING ASSESSMENT: Mr. Bartleson is a 46 y.o. male with a personal history of a pancreatic neuroendocrine tumor and a family history of breast and pancreatic cancer which is somewhat suggestive of a hereditary cancer syndrome and predisposition to cancer given his young age of a neuroendocrine tumor and the combination of cancer. We, therefore, discussed and recommended the following at today's visit.   DISCUSSION: We discussed that, in general, most cancer is not inherited in families, but instead is sporadic or familial. Sporadic cancers occur by chance and typically happen at older ages (>50 years) as this type of cancer is caused by genetic changes acquired during an individuals lifetime. Some families have more cancers than would be expected by chance; however, the ages or types of cancer are not consistent with a known genetic mutation or known genetic mutations have been ruled out. This type of familial cancer is thought to be due to a combination of multiple genetic, environmental, hormonal, and lifestyle factors. While this combination of factors likely  increases the risk of cancer, the exact source of this risk is not currently identifiable or testable.  We discussed that 16 - 17% of pancreatic neuroendocrine tumors are hereditary, with most cases associated with MEN1 mutations.  There are other genes that can be associated with hereditary neuroendocrine pancreatic tumor syndromes.  These include RET, CDKN1B, NF1, TSC1/2 and VHL. Additionally, the family history consists of pancreatic adenocarcinoma and early breast cancer which could be consistent with BRCA1, BRCA2, ATM, or PALB2 mutations.   The patient explained that after he had surgery, he has had recurrent pancreatitis.  He ha been able to control it at times at home, and other times he has been hospitalized.  There  are hereditary pancreatitis genes we can order, however, it sounds that since pancreatitis occurred only after surgery, that it is not due to a hereditary cause.  We discussed that testing is beneficial for several reasons including knowing how to follow individuals and understand if other family members could be at risk for cancer and allow them to undergo genetic testing.   We reviewed the characteristics, features and inheritance patterns of hereditary cancer syndromes. We also discussed genetic testing, including the appropriate family members to test, the process of testing, insurance coverage and turn-around-time for results. We discussed the implications of a negative, positive, carrier and/or variant of uncertain significant result. We recommended Mr. Debarr pursue genetic testing for the CustomNext-Cancer+RNAinsight gene panel.   The CustomNext-Cancer gene panel offered by [facility] and includes sequencing and rearrangement analysis for the following 91 genes: AIP, ALK, APC*, ATM*, AXIN2, BAP1, BARD1, BLM, BMPR1A, BRCA1*, BRCA2*, BRIP1*, CDC73, CDH1*, CDK4, CDKN1B, CDKN2A, CHEK2*, CTNNA1, DICER1, FANCC, FH, FLCN, GALNT12, KIF1B, LZTR1, MAX, MEN1, MET, MLH1*, MRE11A, MSH2*,  MSH3, MSH6*, MUTYH*, NBN, NF1*, NF2, NTHL1, PALB2*, PHOX2B, PMS2*, POT1, PRKAR1A, PTCH1, PTEN*, RAD50, RAD51C*, RAD51D*, RB1, RECQL, RET, SDHA, SDHAF2, SDHB, SDHC, SDHD, SMAD4, SMARCA4, SMARCB1, SMARCE1, STK11, SUFU, TMEM127, TP53*, TSC1, TSC2, VHL and XRCC2 (sequencing and deletion/duplication); CASR, CFTR, CPA1, CTRC, EGFR, EGLN1, FAM175A, HOXB13, KIT, MITF, MLH3, PALLD, PDGFRA, POLD1, POLE, PRSS1, RINT1, RPS20, SPINK1 and TERT (sequencing only); EPCAM and GREM1 (deletion/duplication only). DNA and RNA analyses performed for * genes.   Based on Mr. Plummer's personal history of a neuroendocrine pancreatic tumor and family history of breast and pancreatic cancer, he meets medical criteria for genetic testing. NCCN (2.2022) Neuroendocrine and Adrenal Tumors testing guidelines recommends genetic testing in patients with duodenal/pancreatic neuroendocrine tumors at any age.  Despite that he meets criteria, he may still have an out of pocket cost. We discussed that if his out of pocket cost for testing is over $100, the laboratory will call and confirm whether he wants to proceed with testing.  If the out of pocket cost of testing is less than $100 he will be billed by the genetic testing laboratory.   PLAN: After considering the risks, benefits, and limitations, Mr. Vencill provided informed consent to pursue genetic testing and the blood sample was sent to [facility] for analysis of the CustomNext-Expanded+RNAinsight. Results should be available within approximately 2-3 weeks' time, at which point they will be disclosed by telephone to Mr. Gambrell, as will any additional recommendations warranted by these results. Mr. Zuckerman will receive a summary of his genetic counseling visit and a copy of his results once available. This information will also be available in Epic.   Lastly, we encouraged Mr. Kiedrowski to remain in contact with cancer genetics annually so that we can continuously update  the family history and inform him of any changes in cancer genetics and testing that may be of benefit for this family.   Mr. Weida questions were answered to his satisfaction today. Our contact information was provided should additional questions or concerns arise. Thank you for the referral and allowing [place] to share in the care of your patient.   Rosser Collington P. Florene Glen, [place], [facility] Licensed, Insurance risk surveyor [email or web address] phone: [phone]  The patient was seen for a total of 40 minutes in face-to-face genetic counseling.  The patient was seen alone.  This patient was discussed with Drs. Magrinat, Lindi Adie and/or Burr Medico who agrees with the above.    _______________________________________________________________________ For Office Staff:  Number  of people involved in session: 1 Was an Intern/ student involved with case: no

## 2021-03-29 ENCOUNTER — Ambulatory Visit: Payer: Self-pay | Admitting: Genetic Counselor

## 2021-03-29 ENCOUNTER — Encounter: Payer: Self-pay | Admitting: Genetic Counselor

## 2021-03-29 ENCOUNTER — Telehealth: Payer: Self-pay | Admitting: Genetic Counselor

## 2021-03-29 DIAGNOSIS — Z1379 Encounter for other screening for genetic and chromosomal anomalies: Secondary | ICD-10-CM | POA: Insufficient documentation

## 2021-03-29 NOTE — Progress Notes (Signed)
HPI:  Mr. Scheer was previously seen in the [facility] clinic due to a personal history of PNET and recurrent pancreatitis and family history of cancer and concerns regarding a hereditary predisposition to cancer. Please refer to our prior cancer genetics clinic note for more information regarding our discussion, assessment and recommendations, at the time. Mr. Soth recent genetic test results were disclosed to him, as were recommendations warranted by these results. These results and recommendations are discussed in more detail below.  CANCER HISTORY:  Oncology History   No history exists.    FAMILY HISTORY:  We obtained a detailed, 4-generation family history.  Significant diagnoses are listed below: Family History  Problem Relation Age of Onset   Thyroid cancer Mother 24       papillary   Other Father        MVA at age 32   Lung cancer Maternal Aunt    Pancreatic cancer Maternal Uncle    Esophageal cancer Maternal Grandmother 80       smoker   Breast cancer Maternal Grandmother 41   Esophageal cancer Maternal Grandfather 80       smoker   Esophageal cancer Paternal Grandmother    Healthy Half-Sister    Colon cancer Neg Hx    Rectal cancer Neg Hx      The patient has two children who are cancer free.  He has a maternal half sister who is cancer free.  His father is deceased and his mother is living.   The patient's father died in a car accident at age 53.  He had seven siblings, none the patient is aware of that had cancer.  The paternal grandparents are deceased.   The patient's mother had papillary thyroid cancer at age 58.  She has two sisters and two brothers.  One brother had pancreatic cancer and one sister had lung cancer.  The maternal grandparents are deceased.  They both had esophageal cancer, but his grandmother also had breast cancer at age 39.   Mr. Mitzel is unaware of previous family history of genetic testing for hereditary cancer risks.  Patient's maternal ancestors are of [place], [place], [place] and [place] descent, and paternal ancestors are of [place], [place] and [place] descent. There is no reported Ashkenazi Jewish ancestry. There is no known consanguinity.  GENETIC TEST RESULTS: Genetic testing reported out on March 28, 2021 through the CancerNext-Expanded+RNAinsight cancer panel found no pathogenic mutations. The CancerNext-Expanded gene panel offered by [facility] and includes sequencing and rearrangement analysis for the following 77 genes: AIP, ALK, APC*, ATM*, AXIN2, BAP1, BARD1, BLM, BMPR1A, BRCA1*, BRCA2*, BRIP1*, CDC73, CDH1*, CDK4, CDKN1B, CDKN2A, CHEK2*, CTNNA1, DICER1, FANCC, FH, FLCN, GALNT12, KIF1B, LZTR1, MAX, MEN1, MET, MLH1*, MSH2*, MSH3, MSH6*, MUTYH*, NBN, NF1*, NF2, NTHL1, PALB2*, PHOX2B, PMS2*, POT1, PRKAR1A, PTCH1, PTEN*, RAD51C*, RAD51D*, RB1, RECQL, RET, SDHA, SDHAF2, SDHB, SDHC, SDHD, SMAD4, SMARCA4, SMARCB1, SMARCE1, STK11, SUFU, TMEM127, TP53*, TSC1, TSC2, VHL and XRCC2 (sequencing and deletion/duplication); EGFR, EGLN1, HOXB13, KIT, MITF, PDGFRA, POLD1, and POLE (sequencing only); EPCAM and GREM1 (deletion/duplication only). DNA and RNA analyses performed for * genes. The test report has been scanned into EPIC and is located under the Molecular Pathology section of the Results Review tab.  A portion of the result report is included below for reference.     We discussed with Mr. Setters that because current genetic testing is not perfect, it is possible there may be a gene mutation in one of these genes that current testing  cannot detect, but that chance is small.  We also discussed, that there could be another gene that has not yet been discovered, or that we have not yet tested, that is responsible for the cancer diagnoses in the family. It is also possible there is a hereditary cause for the cancer in the family that Mr. Corron did not inherit and therefore was not identified in his testing.   Therefore, it is important to remain in touch with cancer genetics in the future so that we can continue to offer Mr. Irion the most up to date genetic testing.   ADDITIONAL GENETIC TESTING: We discussed with Mr. Flenner that his genetic testing was fairly extensive.  If there are genes identified to increase cancer risk that can be analyzed in the future, we would be happy to discuss and coordinate this testing at that time.    CANCER SCREENING RECOMMENDATIONS: Mr. Mays test result is considered negative (normal).  This means that we have not identified a hereditary cause for his personal history of PNET and recurrent pancreatitis at this time. Most cancers happen by chance and this negative test suggests that his cancer may fall into this category.    While reassuring, this does not definitively rule out a hereditary predisposition to cancer. It is still possible that there could be genetic mutations that are undetectable by current technology. There could be genetic mutations in genes that have not been tested or identified to increase cancer risk.  Therefore, it is recommended he continue to follow the cancer management and screening guidelines provided by his oncology and primary healthcare provider.   An individual's cancer risk and medical management are not determined by genetic test results alone. Overall cancer risk assessment incorporates additional factors, including personal medical history, family history, and any available genetic information that may result in a personalized plan for cancer prevention and surveillance  RECOMMENDATIONS FOR FAMILY MEMBERS:  Individuals in this family might be at some increased risk of developing cancer, over the general population risk, simply due to the family history of cancer.  We recommended women in this family have a yearly mammogram beginning at age 41, or 1 years younger than the earliest onset of cancer, an annual clinical breast exam, and  perform monthly breast self-exams. Women in this family should also have a gynecological exam as recommended by their primary provider. All family members should be referred for colonoscopy starting at age 83.  FOLLOW-UP: Lastly, we discussed with Mr. Dangerfield that cancer genetics is a rapidly advancing field and it is possible that new genetic tests will be appropriate for him and/or his family members in the future. We encouraged him to remain in contact with cancer genetics on an annual basis so we can update his personal and family histories and let him know of advances in cancer genetics that may benefit this family.   Our contact number was provided. Mr. Goethe questions were answered to his satisfaction, and he knows he is welcome to call us at anytime with additional questions or concerns.   Roma Kayser, [place], [facility] Licensed, Certified Genetic Counselor [email or web address]

## 2021-03-29 NOTE — Telephone Encounter (Signed)
Revealed negative genetic testing.  Discussed that we do not know why he has PNET or recurrent pancreatitis or why there is cancer in the family. It could be due to a different gene that we are not testing, or maybe our current technology may not be able to pick something up.  It will be important for him to keep in contact with genetics to keep up with whether additional testing may be needed.

## 2021-04-04 ENCOUNTER — Other Ambulatory Visit (INDEPENDENT_AMBULATORY_CARE_PROVIDER_SITE_OTHER): Payer: Medicare HMO

## 2021-04-04 ENCOUNTER — Ambulatory Visit (INDEPENDENT_AMBULATORY_CARE_PROVIDER_SITE_OTHER): Payer: Medicare HMO | Admitting: Gastroenterology

## 2021-04-04 VITALS — BP 114/68 | HR 72 | Ht 74.0 in | Wt 255.0 lb

## 2021-04-04 DIAGNOSIS — K859 Acute pancreatitis without necrosis or infection, unspecified: Secondary | ICD-10-CM

## 2021-04-04 NOTE — Patient Instructions (Signed)
If you are age 45 or younger, your body mass index should be between 19-25. Your Body mass index is 32.74 kg/m. If this is out of the aformentioned range listed, please consider follow up with your Primary Care Provider.  ________________________________________________________  The [facility] providers would like to encourage you to use [facility] to communicate with providers for non-urgent requests or questions.  Due to long hold times on the telephone, sending your provider a message by [facility] may be a faster and more efficient way to get a response.  Please allow 48 business hours for a response.  Please remember that this is for non-urgent requests.  _______________________________________________________  Your provider has requested that you go to the basement level for lab work before leaving today. Press "B" on the elevator. The lab is located at the first door on the left as you exit the elevator.  Due to recent changes in healthcare laws, you may see the results of your imaging and laboratory studies on MyChart before your provider has had a chance to review them.  We understand that in some cases there may be results that are confusing or concerning to you. Not all laboratory results come back in the same time frame and the provider may be waiting for multiple results in order to interpret others.  Please give [place] 48 hours in order for your provider to thoroughly review all the results before contacting the office for clarification of your results.   Thank you for entrusting me with your care and choosing [facility].  Dr Ardis Hughs

## 2021-04-04 NOTE — Progress Notes (Signed)
Review of pertinent gastrointestinal problems: 1.  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 Barry Dienes, 4 cm across. 2.  Recurrent acute pancreatitis since pancreatic surgery.  CT scan 01/2021 showed "mild acute uncomplicated pancreatitis involving the pancreatic head."  No obvious recurrent masses.  MRI 01/2021 " mild acute pancreatitis, without evidence of complication.  Status post distal pancreatectomy.  Near complete resolution of prior postsurgical seroma along the surgical margin.  No findings suspicious for recurrence."  No intrahepatic biliary dilation. IgG4 level normal Normal triglycerides Nonalcohol drinker No stones in the gallbladder on MRI 2022 3.  Uncle had pancreatic cancer 4.  Genetic testing February 2023 was unremarkable   HPI: This is a very pleasant 46 year old man  I last saw him when he was hospitalized for a 3-4 night stay for mild acute pancreatitis.  Since discharge she has felt well.  He did have 1 episode of epigastric discomfort that lasted for a day or so, he limited his diet to liquids only and then felt better pretty quickly.  He thought that this might be the beginning of another episode of pancreatitis.  His weight is overall stable.  He again confirmed that he does not drink alcohol.   ROS: complete GI ROS as described in HPI, all other review negative.  Constitutional:  No unintentional weight loss   Past Medical History:  Diagnosis Date   Arthritis    Chronic back pain    Depression    Family history of breast cancer    Family history of pancreatic cancer    GERD (gastroesophageal reflux disease)    Knee pain    Pancreatic tumor     Past Surgical History:  Procedure Laterality Date   BACK SURGERY  03/2018   Dr Trenton Gammon.  L4-L5, S1 micro discectomy   EUS N/A 09/25/2017   Procedure: UPPER ENDOSCOPIC ULTRASOUND (EUS) LINEAR;  Surgeon: Milus Banister, MD;  Location: [facility];  Service: Endoscopy;  Laterality:  N/A;  Radial and Linear   FINE NEEDLE ASPIRATION  09/25/2017   Procedure: FINE NEEDLE ASPIRATION (FNA) RADIAL;  Surgeon: Milus Banister, MD;  Location: [facility];  Service: Endoscopy;;   KNEE SURGERY Left    x 20 surgeries   LAPAROSCOPIC DISTAL PANCREATECTOMY  11/26/2017   Diagnostic laparoscopy, laparoscopic hand-assisted distal    MOUTH SURGERY     as a child, unsure if wisdom teeth or tonsils   PANCREATECTOMY N/A 11/25/2017   Procedure: LAPAROSCOPIC DISTAL PANCREATECTOMY AND SPLENECTOMY ERAS PATHWAY;  Surgeon: Stark Klein, MD;  Location: [facility];  Service: General;  Laterality: N/A;   Removal of Spleen  2019    Current Outpatient Medications  Medication Sig Dispense Refill   Multiple Vitamins-Minerals (MENS MULTIPLUS PO) Take 1 tablet by mouth daily.     omeprazole (PRILOSEC OTC) 20 MG tablet Take 40 mg by mouth in the morning and at bedtime.     No current facility-administered medications for this visit.    Allergies as of 04/04/2021 - Review Complete 02/05/2021  Allergen Reaction Noted   Adhesive [tape] Rash and Other (See Comments) 12/01/2018    Family History  Problem Relation Age of Onset   Thyroid cancer Mother 46       papillary   Other Father        MVA at age 55   Lung cancer Maternal Aunt    Pancreatic cancer Maternal Uncle    Esophageal cancer Maternal Grandmother 68  smoker   Breast cancer Maternal Grandmother 7   Esophageal cancer Maternal Grandfather 80       smoker   Esophageal cancer Paternal Grandmother    Healthy Half-Sister    Colon cancer Neg Hx    Rectal cancer Neg Hx     Social History   Socioeconomic History   Marital status: Single    Spouse name: Not on file   Number of children: 2   Years of education: Not on file   Highest education level: Not on file  Occupational History   Occupation: Disabled  Tobacco Use   Smoking status: Former   Smokeless tobacco: Never   Tobacco comments:    Quit 14-15 years ago (as of 2019)   Vaping Use   Vaping Use: Never used  Substance and Sexual Activity   Alcohol use: No   Drug use: Yes    Types: Psilocybin   Sexual activity: Not on file  Other Topics Concern   Not on file  Social History Narrative   Not on file   Social Determinants of Health   Financial Resource Strain: Not on file  Food Insecurity: Not on file  Transportation Needs: Not on file  Physical Activity: Not on file  Stress: Not on file  Social Connections: Not on file  Intimate Partner Violence: Not on file     Physical Exam: BP 114/68    Pulse 72    Ht 6\' 2"  (1.88 m)    Wt 255 lb (115.7 kg)    BMI 32.74 kg/m  Constitutional: generally well-appearing Xanthelasmas above both eyes Psychiatric: alert and oriented x3 Abdomen: soft, nontender, nondistended, no obvious ascites, no peritoneal signs, normal bowel sounds No peripheral edema noted in lower extremities  Assessment and plan: 46 y.o. male with recurrent acute pancreatitis, 2019 distal pancreatectomy for solid pseudopapillary tumor  Unclear etiology of his recurrent pancreatitis.  He was imaged extensively 2 months ago when he was hospitalized with MRI and also CT scan and there certainly is no sign of recurrent tumor in his pancreas.  He does not drink alcohol.  He does not have obvious stones in his gallbladder on MRI or CT scan.  His triglyceride level was normal when he was hospitalized.  He does have xanthelasmas above both eyes and I do wonder if he has elevated lipids, hypertriglyceridemia can certainly cause acute pancreatitis.  I am better recheck his triglyceride level today, he has not eaten anything yet.  If that repeat triglyceride level is indeed normal which I expected probably will be, I am going to refer him back to his surgeon Dr. Barry Dienes to consider elective cholecystectomy for very difficult to prove or disprove microlithiasis.  Please see the "Patient Instructions" section for addition details about the plan.  Owens Loffler, MD [facility] 04/04/2021, 11:18 AM   Total time on date of encounter was 35 minutes (this included time spent preparing to see the patient reviewing records; obtaining and/or reviewing separately obtained history; performing a medically appropriate exam and/or evaluation; counseling and educating the patient and family if present; ordering medications, tests or procedures if applicable; and documenting clinical information in the health record).

## 2021-04-05 LAB — TRIGLYCERIDES: Triglycerides: 137 mg/dL (ref 0.0–149.0)

## 2021-04-12 DIAGNOSIS — J029 Acute pharyngitis, unspecified: Secondary | ICD-10-CM | POA: Diagnosis not present

## 2021-04-12 DIAGNOSIS — R062 Wheezing: Secondary | ICD-10-CM | POA: Diagnosis not present

## 2021-04-12 DIAGNOSIS — J209 Acute bronchitis, unspecified: Secondary | ICD-10-CM | POA: Diagnosis not present

## 2021-05-11 DIAGNOSIS — K8500 Idiopathic acute pancreatitis without necrosis or infection: Secondary | ICD-10-CM | POA: Diagnosis not present

## 2021-05-21 ENCOUNTER — Other Ambulatory Visit: Payer: Self-pay | Admitting: General Surgery

## 2021-05-21 DIAGNOSIS — K8500 Idiopathic acute pancreatitis without necrosis or infection: Secondary | ICD-10-CM

## 2021-05-25 ENCOUNTER — Inpatient Hospital Stay: Admission: RE | Admit: 2021-05-25 | Payer: Medicare HMO | Source: Ambulatory Visit

## 2021-06-07 ENCOUNTER — Ambulatory Visit
Admission: RE | Admit: 2021-06-07 | Discharge: 2021-06-07 | Disposition: A | Discharge status: Home / Self Care | Payer: Medicare HMO | Source: Ambulatory Visit | Attending: General Surgery | Admitting: General Surgery

## 2021-06-07 DIAGNOSIS — K8500 Idiopathic acute pancreatitis without necrosis or infection: Secondary | ICD-10-CM

## 2021-06-07 DIAGNOSIS — K859 Acute pancreatitis without necrosis or infection, unspecified: Secondary | ICD-10-CM | POA: Diagnosis not present

## 2021-08-27 DIAGNOSIS — K8500 Idiopathic acute pancreatitis without necrosis or infection: Secondary | ICD-10-CM | POA: Diagnosis not present

## 2022-03-17 ENCOUNTER — Encounter (HOSPITAL_COMMUNITY): Payer: Self-pay

## 2022-03-17 ENCOUNTER — Other Ambulatory Visit: Payer: Self-pay

## 2022-03-17 ENCOUNTER — Encounter (HOSPITAL_BASED_OUTPATIENT_CLINIC_OR_DEPARTMENT_OTHER): Payer: Self-pay

## 2022-03-17 ENCOUNTER — Emergency Department (HOSPITAL_BASED_OUTPATIENT_CLINIC_OR_DEPARTMENT_OTHER): Payer: Medicare HMO

## 2022-03-17 ENCOUNTER — Inpatient Hospital Stay (HOSPITAL_BASED_OUTPATIENT_CLINIC_OR_DEPARTMENT_OTHER)
Admission: EM | Admit: 2022-03-17 | Discharge: 2022-03-22 | DRG: 440 | Disposition: A | Discharge status: Home / Self Care | Payer: Medicare HMO | Attending: Internal Medicine | Admitting: Internal Medicine

## 2022-03-17 DIAGNOSIS — Z800 Family history of malignant neoplasm of digestive organs: Secondary | ICD-10-CM

## 2022-03-17 DIAGNOSIS — K760 Fatty (change of) liver, not elsewhere classified: Secondary | ICD-10-CM | POA: Diagnosis present

## 2022-03-17 DIAGNOSIS — Z9081 Acquired absence of spleen: Secondary | ICD-10-CM

## 2022-03-17 DIAGNOSIS — Z87891 Personal history of nicotine dependence: Secondary | ICD-10-CM | POA: Diagnosis not present

## 2022-03-17 DIAGNOSIS — T402X5A Adverse effect of other opioids, initial encounter: Secondary | ICD-10-CM | POA: Diagnosis present

## 2022-03-17 DIAGNOSIS — K219 Gastro-esophageal reflux disease without esophagitis: Secondary | ICD-10-CM | POA: Diagnosis present

## 2022-03-17 DIAGNOSIS — Z803 Family history of malignant neoplasm of breast: Secondary | ICD-10-CM | POA: Diagnosis not present

## 2022-03-17 DIAGNOSIS — R739 Hyperglycemia, unspecified: Secondary | ICD-10-CM | POA: Diagnosis not present

## 2022-03-17 DIAGNOSIS — Z8507 Personal history of malignant neoplasm of pancreas: Secondary | ICD-10-CM

## 2022-03-17 DIAGNOSIS — Z888 Allergy status to other drugs, medicaments and biological substances status: Secondary | ICD-10-CM

## 2022-03-17 DIAGNOSIS — K7689 Other specified diseases of liver: Secondary | ICD-10-CM | POA: Diagnosis not present

## 2022-03-17 DIAGNOSIS — Z79899 Other long term (current) drug therapy: Secondary | ICD-10-CM | POA: Diagnosis not present

## 2022-03-17 DIAGNOSIS — M549 Dorsalgia, unspecified: Secondary | ICD-10-CM | POA: Diagnosis present

## 2022-03-17 DIAGNOSIS — R109 Unspecified abdominal pain: Secondary | ICD-10-CM | POA: Diagnosis not present

## 2022-03-17 DIAGNOSIS — I10 Essential (primary) hypertension: Secondary | ICD-10-CM | POA: Diagnosis not present

## 2022-03-17 DIAGNOSIS — K5903 Drug induced constipation: Secondary | ICD-10-CM | POA: Diagnosis present

## 2022-03-17 DIAGNOSIS — Z90411 Acquired partial absence of pancreas: Secondary | ICD-10-CM

## 2022-03-17 DIAGNOSIS — Z6831 Body mass index (BMI) 31.0-31.9, adult: Secondary | ICD-10-CM | POA: Diagnosis not present

## 2022-03-17 DIAGNOSIS — K861 Other chronic pancreatitis: Secondary | ICD-10-CM | POA: Diagnosis present

## 2022-03-17 DIAGNOSIS — K8689 Other specified diseases of pancreas: Secondary | ICD-10-CM | POA: Diagnosis not present

## 2022-03-17 DIAGNOSIS — E669 Obesity, unspecified: Secondary | ICD-10-CM | POA: Diagnosis present

## 2022-03-17 DIAGNOSIS — Z801 Family history of malignant neoplasm of trachea, bronchus and lung: Secondary | ICD-10-CM

## 2022-03-17 DIAGNOSIS — Z91048 Other nonmedicinal substance allergy status: Secondary | ICD-10-CM

## 2022-03-17 DIAGNOSIS — I700 Atherosclerosis of aorta: Secondary | ICD-10-CM | POA: Diagnosis not present

## 2022-03-17 DIAGNOSIS — Z808 Family history of malignant neoplasm of other organs or systems: Secondary | ICD-10-CM | POA: Diagnosis not present

## 2022-03-17 DIAGNOSIS — N281 Cyst of kidney, acquired: Secondary | ICD-10-CM | POA: Diagnosis not present

## 2022-03-17 DIAGNOSIS — K859 Acute pancreatitis without necrosis or infection, unspecified: Principal | ICD-10-CM | POA: Diagnosis present

## 2022-03-17 LAB — COMPREHENSIVE METABOLIC PANEL
ALT: 50 U/L — ABNORMAL HIGH (ref 0–44)
AST: 29 U/L (ref 15–41)
Albumin: 3.9 g/dL (ref 3.5–5.0)
Alkaline Phosphatase: 75 U/L (ref 38–126)
Anion gap: 10 (ref 5–15)
BUN: 9 mg/dL (ref 6–20)
CO2: 23 mmol/L (ref 22–32)
Calcium: 8.4 mg/dL — ABNORMAL LOW (ref 8.9–10.3)
Chloride: 101 mmol/L (ref 98–111)
Creatinine, Ser: 0.96 mg/dL (ref 0.61–1.24)
GFR, Estimated: >60 mL/min (ref >60)
Glucose, Bld: 154 mg/dL — ABNORMAL HIGH (ref 70–99)
Potassium: 3.7 mmol/L (ref 3.5–5.1)
Sodium: 134 mmol/L — ABNORMAL LOW (ref 135–145)
Total Bilirubin: 0.2 mg/dL — ABNORMAL LOW (ref 0.3–1.2)
Total Protein: 7.7 g/dL (ref 6.5–8.1)

## 2022-03-17 LAB — URINALYSIS, ROUTINE W REFLEX MICROSCOPIC
Bilirubin Urine: NEGATIVE
Glucose, UA: NEGATIVE mg/dL
Ketones, ur: NEGATIVE mg/dL
Leukocytes,Ua: NEGATIVE
Nitrite: NEGATIVE
Protein, ur: NEGATIVE mg/dL
Specific Gravity, Urine: 1.025 (ref 1.005–1.030)
pH: 6 (ref 5.0–8.0)

## 2022-03-17 LAB — CBC
HCT: 39.6 % (ref 39.0–52.0)
Hemoglobin: 13.5 g/dL (ref 13.0–17.0)
MCH: 31.9 pg (ref 26.0–34.0)
MCHC: 34.1 g/dL (ref 30.0–36.0)
MCV: 93.6 fL (ref 80.0–100.0)
Platelets: 410 10*3/uL — ABNORMAL HIGH (ref 150–400)
RBC: 4.23 MIL/uL (ref 4.22–5.81)
RDW: 13.7 % (ref 11.5–15.5)
WBC: 15 10*3/uL — ABNORMAL HIGH (ref 4.0–10.5)
nRBC: 0 % (ref 0.0–0.2)

## 2022-03-17 LAB — URINALYSIS, MICROSCOPIC (REFLEX)

## 2022-03-17 LAB — LIPASE, BLOOD: Lipase: 62 U/L — ABNORMAL HIGH (ref 11–51)

## 2022-03-17 MED ORDER — IOHEXOL 300 MG/ML  SOLN
100.0000 mL | Freq: Once | INTRAMUSCULAR | Status: AC | PRN
  Administered 2022-03-17: 100 mL via INTRAVENOUS

## 2022-03-17 MED ORDER — SODIUM CHLORIDE 0.9 % IV BOLUS
1000.0000 mL | Freq: Once | INTRAVENOUS | Status: AC
  Administered 2022-03-17: 1000 mL via INTRAVENOUS

## 2022-03-17 MED ORDER — HYDROMORPHONE HCL 1 MG/ML IJ SOLN: 1.0000 mg | Freq: Once | INTRAMUSCULAR | Status: DC

## 2022-03-17 MED ORDER — HYDROMORPHONE HCL 1 MG/ML IJ SOLN
1.0000 mg | Freq: Once | INTRAMUSCULAR | Status: AC
  Administered 2022-03-17: 1 mg via INTRAVENOUS
  Filled 2022-03-17: qty 1

## 2022-03-17 MED ORDER — KETOROLAC TROMETHAMINE 15 MG/ML IJ SOLN
15.0000 mg | Freq: Once | INTRAMUSCULAR | Status: AC
  Administered 2022-03-17: 15 mg via INTRAVENOUS
  Filled 2022-03-17: qty 1

## 2022-03-17 MED ORDER — ONDANSETRON HCL 4 MG/2ML IJ SOLN
4.0000 mg | Freq: Once | INTRAMUSCULAR | Status: AC
  Administered 2022-03-17: 4 mg via INTRAVENOUS
  Filled 2022-03-17: qty 2

## 2022-03-17 NOTE — ED Provider Notes (Signed)
[facility] AT [facility] Provider Note   CSN: [id number] Arrival date & time: 03/17/22  2103     History  Chief Complaint  Patient presents with   Abdominal Pain    Andre Simmons is a 47 y.o. male with a past medical history of neuroendocrine tumor of his pancreas and chronic pancreatitis presenting today with epigastric pain.  Reports that he feels as though he is having a flare of his pancreatitis and that it started this morning.  Did not change his diet.  Does not drink alcohol.  Reports that he has not tried any medications at home because he knew that they would not work.  Does say that he tried to decrease his p.o. intake today because this sometimes helps his symptoms but it did not work.  Some nausea and dry heaving.   Abdominal Pain      Home Medications Prior to Admission medications   Medication Sig Start Date End Date Taking? Authorizing Provider  Multiple Vitamins-Minerals (MENS MULTIPLUS PO) Take 1 tablet by mouth daily.    [provider]  omeprazole (PRILOSEC OTC) 20 MG tablet Take 40 mg by mouth in the morning and at bedtime.    [provider]      Allergies    Adhesive [tape]    Review of Systems   Review of Systems  Gastrointestinal:  Positive for abdominal pain.    Physical Exam Updated Vital Signs BP (!) 145/92 (BP Location: Right Arm)   Pulse 87   Temp 99.2 F (37.3 C) (Oral)   Resp 17   Ht '6\' 2"'$  (1.88 m)   Wt 108.9 kg   SpO2 97%   BMI 30.81 kg/m  Physical Exam Vitals and nursing note reviewed.  Constitutional:      Appearance: Normal appearance.  HENT:     Head: Normocephalic and atraumatic.  Eyes:     General: No scleral icterus.    Conjunctiva/sclera: Conjunctivae normal.  Pulmonary:     Effort: Pulmonary effort is normal. No respiratory distress.  Abdominal:     General: Abdomen is flat.     Palpations: Abdomen is soft.     Tenderness: There is abdominal tenderness in the  epigastric area.  Skin:    Findings: No rash.  Neurological:     Mental Status: He is alert.  Psychiatric:        Mood and Affect: Mood normal.     ED Results / Procedures / Treatments   Labs (all labs ordered are listed, but only abnormal results are displayed) Labs Reviewed  LIPASE, BLOOD - Abnormal; Notable for the following components:      Result Value   Lipase 62 (*)    All other components within normal limits  COMPREHENSIVE METABOLIC PANEL - Abnormal; Notable for the following components:   Sodium 134 (*)    Glucose, Bld 154 (*)    Calcium 8.4 (*)    ALT 50 (*)    Total Bilirubin 0.2 (*)    All other components within normal limits  CBC - Abnormal; Notable for the following components:   WBC 15.0 (*)    Platelets 410 (*)    All other components within normal limits  URINALYSIS, ROUTINE W REFLEX MICROSCOPIC - Abnormal; Notable for the following components:   Hgb urine dipstick TRACE (*)    All other components within normal limits  URINALYSIS, MICROSCOPIC (REFLEX) - Abnormal; Notable for the following components:   Bacteria, UA  FEW (*)    All other components within normal limits    EKG None  Radiology CT ABDOMEN PELVIS W CONTRAST  Result Date: 03/17/2022 CLINICAL DATA:  Left upper quadrant abdominal pain. EXAM: CT ABDOMEN AND PELVIS WITH CONTRAST TECHNIQUE: Multidetector CT imaging of the abdomen and pelvis was performed using the standard protocol following bolus administration of intravenous contrast. RADIATION DOSE REDUCTION: This exam was performed according to the departmental dose-optimization program which includes automated exposure control, adjustment of the mA and/or kV according to patient size and/or use of iterative reconstruction technique. CONTRAST:  171m OMNIPAQUE IOHEXOL 300 MG/ML  SOLN COMPARISON:  February 04, 2021 FINDINGS: Lower chest: No acute abnormality. Hepatobiliary: There is diffuse fatty infiltration of the liver with areas of focal  fatty sparing seen adjacent to the gallbladder fossa. No gallstones, gallbladder wall thickening, or biliary dilatation. Pancreas: Postsurgical changes are seen consistent with the patient's history of distal pancreatectomy. Mild peripancreatic inflammatory fat stranding is seen within the head of the pancreas. No pancreatic ductal dilatation is seen. Spleen: Normal in size without focal abnormality. Adrenals/Urinary Tract: Adrenal glands are unremarkable. Kidneys are normal, without renal calculi, focal lesion, or hydronephrosis. Bladder is unremarkable. Stomach/Bowel: Stomach is within normal limits. Appendix appears normal. No evidence of bowel wall thickening, distention, or inflammatory changes. Vascular/Lymphatic: Aortic atherosclerosis. No enlarged abdominal or pelvic lymph nodes. Reproductive: Prostate is unremarkable. Other: No abdominal wall hernia or abnormality. No abdominopelvic ascites. Musculoskeletal: Postoperative changes are seen within the lower lumbar spine. IMPRESSION: 1. Mild acute pancreatitis. Correlation with pancreatic enzymes is recommended. 2. Hepatic steatosis. 3. Postsurgical changes consistent with the patient's history of distal pancreatectomy. 4. Aortic atherosclerosis. Aortic Atherosclerosis (ICD10-I70.0). Electronically Signed   By: TVirgina NorfolkM.D.   On: 03/17/2022 23:41    Procedures Procedures   Medications Ordered in ED Medications  sodium chloride 0.9 % bolus 1,000 mL (0 mLs Intravenous Stopped 03/17/22 2359)  ondansetron (ZOFRAN) injection 4 mg (4 mg Intravenous Given 03/17/22 2232)  ketorolac (TORADOL) 15 MG/ML injection 15 mg (15 mg Intravenous Given 03/17/22 2233)  HYDROmorphone (DILAUDID) injection 1 mg (1 mg Intravenous Given 03/17/22 2339)  iohexol (OMNIPAQUE) 300 MG/ML solution 100 mL (100 mLs Intravenous Contrast Given 03/17/22 2335)    ED Course/ Medical Decision Making/ A&P                             Medical Decision Making Amount and/or  Complexity of Data Reviewed Labs: ordered. Radiology: ordered.  Risk Prescription drug management. Decision regarding hospitalization.   47year old male presenting with epigastric pain.  Differential includes but is not limited to acute on chronic pancreatitis, PUD, gastritis, esophagitis, gastroenteritis, cholecystitis.  This is not an exhaustive differential.    Past Medical History / Co-morbidities / Social History: History of chronic pancreatitis and neuroendocrine pancreatic tumor   Additional history: Per chart review patient had a laparoscopic pancreatectomy and splenectomy in 2019.  Last was admitted for his pancreatitis in December 2022.  Follows with [facility].   Physical Exam: Pertinent physical exam findings include Resting comfortably Epigastric tenderness  Lab Tests: I ordered, and personally interpreted labs.  The pertinent results include: WBC 15 Lipase 62 ALT 54, recurrently elevated in his past episodes of pancreatitis   Imaging Studies: I ordered and independently visualized and interpreted CT abdomen pelvis and I agree with the radiologist that there are signs of pancreatitis.     Medications: Patient drove to the emergency  department today.  We discussed that I cannot discharge him home after giving opioid medications if he is driving.  He says he is unable to get a ride.  Initial orders were Zofran, fluids and Toradol.  On reassessment patient reports no improvement in pain.  He was then given Dilaudid with only mild relief.  MDM/Disposition: This is a 47 year old male with a past medical history of chronic pancreatitis secondary to neuroendocrine tumor of the pancreas presenting today with a flare of his pancreatitis.  He is unsure what triggered it.  Refractory to Tylenol at home.  Was given Toradol at first with hope to discharge the patient however this did not help him so he was subsequently given Dilaudid.  Says that it helped his pain some but  continues to have moderate pain and is not tolerating p.o. intake.  Lipase mildly elevated.  CT showing acute pancreatitis.  I believe patient meets admission criteria at this time.  He is agreeable and will be admitted for his pancreatitis, emesis and pain control.  12:20am: Patient signed out to night physician, Dr.Sheldon, to take the admission call.  Hospitalist has been repaged at this time.    Final Clinical Impression(s) / ED Diagnoses Final diagnoses:  Acute pancreatitis, unspecified complication status, unspecified pancreatitis type    Rx / DC Orders ED Discharge Orders     None      I discussed this case with my attending physician Dr. Truett Mainland who cosigned this note including patient's presenting symptoms, physical exam, and planned diagnostics and interventions. Attending physician stated agreement with plan or made changes to plan which were implemented.      Darliss Ridgel 03/18/22 0015    Cristie Hem, MD 03/20/22 [phone]

## 2022-03-17 NOTE — ED Triage Notes (Signed)
Patient reports LUQ and centralized abdominal pain rated 10/10. Patient states he vomited twice today. History or pancreatitis. No changes in his bowel movements or urine.

## 2022-03-18 ENCOUNTER — Observation Stay (HOSPITAL_COMMUNITY): Payer: Medicare HMO

## 2022-03-18 ENCOUNTER — Encounter (HOSPITAL_COMMUNITY): Payer: Self-pay | Admitting: *Deleted

## 2022-03-18 ENCOUNTER — Inpatient Hospital Stay (HOSPITAL_COMMUNITY): Payer: Medicare HMO

## 2022-03-18 DIAGNOSIS — K861 Other chronic pancreatitis: Secondary | ICD-10-CM

## 2022-03-18 DIAGNOSIS — Z79899 Other long term (current) drug therapy: Secondary | ICD-10-CM | POA: Diagnosis not present

## 2022-03-18 DIAGNOSIS — Z808 Family history of malignant neoplasm of other organs or systems: Secondary | ICD-10-CM | POA: Diagnosis not present

## 2022-03-18 DIAGNOSIS — K219 Gastro-esophageal reflux disease without esophagitis: Secondary | ICD-10-CM | POA: Diagnosis present

## 2022-03-18 DIAGNOSIS — I10 Essential (primary) hypertension: Secondary | ICD-10-CM | POA: Diagnosis present

## 2022-03-18 DIAGNOSIS — T402X5A Adverse effect of other opioids, initial encounter: Secondary | ICD-10-CM | POA: Diagnosis present

## 2022-03-18 DIAGNOSIS — Z803 Family history of malignant neoplasm of breast: Secondary | ICD-10-CM | POA: Diagnosis not present

## 2022-03-18 DIAGNOSIS — K5903 Drug induced constipation: Secondary | ICD-10-CM | POA: Diagnosis present

## 2022-03-18 DIAGNOSIS — Z9081 Acquired absence of spleen: Secondary | ICD-10-CM | POA: Diagnosis not present

## 2022-03-18 DIAGNOSIS — Z91048 Other nonmedicinal substance allergy status: Secondary | ICD-10-CM | POA: Diagnosis not present

## 2022-03-18 DIAGNOSIS — K859 Acute pancreatitis without necrosis or infection, unspecified: Secondary | ICD-10-CM | POA: Diagnosis present

## 2022-03-18 DIAGNOSIS — E669 Obesity, unspecified: Secondary | ICD-10-CM | POA: Diagnosis present

## 2022-03-18 DIAGNOSIS — Z8507 Personal history of malignant neoplasm of pancreas: Secondary | ICD-10-CM | POA: Diagnosis not present

## 2022-03-18 DIAGNOSIS — Z888 Allergy status to other drugs, medicaments and biological substances status: Secondary | ICD-10-CM | POA: Diagnosis not present

## 2022-03-18 DIAGNOSIS — Z800 Family history of malignant neoplasm of digestive organs: Secondary | ICD-10-CM | POA: Diagnosis not present

## 2022-03-18 DIAGNOSIS — M549 Dorsalgia, unspecified: Secondary | ICD-10-CM | POA: Diagnosis present

## 2022-03-18 DIAGNOSIS — R739 Hyperglycemia, unspecified: Secondary | ICD-10-CM | POA: Diagnosis present

## 2022-03-18 DIAGNOSIS — Z90411 Acquired partial absence of pancreas: Secondary | ICD-10-CM | POA: Diagnosis not present

## 2022-03-18 DIAGNOSIS — Z87891 Personal history of nicotine dependence: Secondary | ICD-10-CM | POA: Diagnosis not present

## 2022-03-18 DIAGNOSIS — Z6831 Body mass index (BMI) 31.0-31.9, adult: Secondary | ICD-10-CM | POA: Diagnosis not present

## 2022-03-18 DIAGNOSIS — Z801 Family history of malignant neoplasm of trachea, bronchus and lung: Secondary | ICD-10-CM | POA: Diagnosis not present

## 2022-03-18 DIAGNOSIS — K760 Fatty (change of) liver, not elsewhere classified: Secondary | ICD-10-CM | POA: Diagnosis present

## 2022-03-18 LAB — LIPID PANEL
Cholesterol: 160 mg/dL (ref 0–200)
HDL: 33 mg/dL — ABNORMAL LOW (ref >40)
LDL Cholesterol: 113 mg/dL — ABNORMAL HIGH (ref 0–99)
Total CHOL/HDL Ratio: 4.8 RATIO
Triglycerides: 72 mg/dL (ref <150)
VLDL: 14 mg/dL (ref 0–40)

## 2022-03-18 LAB — CBC WITH DIFFERENTIAL/PLATELET
Abs Immature Granulocytes: 0.05 10*3/uL (ref 0.00–0.07)
Basophils Absolute: 0.1 10*3/uL (ref 0.0–0.1)
Basophils Relative: 1 %
Eosinophils Absolute: 0.4 10*3/uL (ref 0.0–0.5)
Eosinophils Relative: 3 %
HCT: 39.6 % (ref 39.0–52.0)
Hemoglobin: 13 g/dL (ref 13.0–17.0)
Immature Granulocytes: 0 %
Lymphocytes Relative: 24 %
Lymphs Abs: 3 10*3/uL (ref 0.7–4.0)
MCH: 32.2 pg (ref 26.0–34.0)
MCHC: 32.8 g/dL (ref 30.0–36.0)
MCV: 98 fL (ref 80.0–100.0)
Monocytes Absolute: 1.3 10*3/uL — ABNORMAL HIGH (ref 0.1–1.0)
Monocytes Relative: 10 %
Neutro Abs: 7.7 10*3/uL (ref 1.7–7.7)
Neutrophils Relative %: 62 %
Platelets: 493 10*3/uL — ABNORMAL HIGH (ref 150–400)
RBC: 4.04 MIL/uL — ABNORMAL LOW (ref 4.22–5.81)
RDW: 14.2 % (ref 11.5–15.5)
WBC: 12.5 10*3/uL — ABNORMAL HIGH (ref 4.0–10.5)
nRBC: 0 % (ref 0.0–0.2)

## 2022-03-18 LAB — COMPREHENSIVE METABOLIC PANEL
ALT: 48 U/L — ABNORMAL HIGH (ref 0–44)
AST: 25 U/L (ref 15–41)
Albumin: 3.7 g/dL (ref 3.5–5.0)
Alkaline Phosphatase: 68 U/L (ref 38–126)
Anion gap: 11 (ref 5–15)
BUN: 9 mg/dL (ref 6–20)
CO2: 24 mmol/L (ref 22–32)
Calcium: 8.5 mg/dL — ABNORMAL LOW (ref 8.9–10.3)
Chloride: 102 mmol/L (ref 98–111)
Creatinine, Ser: 0.93 mg/dL (ref 0.61–1.24)
GFR, Estimated: >60 mL/min (ref >60)
Glucose, Bld: 121 mg/dL — ABNORMAL HIGH (ref 70–99)
Potassium: 4 mmol/L (ref 3.5–5.1)
Sodium: 137 mmol/L (ref 135–145)
Total Bilirubin: 0.6 mg/dL (ref 0.3–1.2)
Total Protein: 7.3 g/dL (ref 6.5–8.1)

## 2022-03-18 LAB — HEMOGLOBIN A1C
Hgb A1c MFr Bld: 6.4 % — ABNORMAL HIGH (ref 4.8–5.6)
Mean Plasma Glucose: 136.98 mg/dL

## 2022-03-18 LAB — HIV ANTIBODY (ROUTINE TESTING W REFLEX): HIV Screen 4th Generation wRfx: NONREACTIVE

## 2022-03-18 MED ORDER — LORAZEPAM 2 MG/ML IJ SOLN
1.0000 mg | INTRAMUSCULAR | Status: AC
  Administered 2022-03-18: 1 mg via INTRAVENOUS
  Filled 2022-03-18: qty 1

## 2022-03-18 MED ORDER — GADOBUTROL 1 MMOL/ML IV SOLN
10.0000 mL | Freq: Once | INTRAVENOUS | Status: AC | PRN
  Administered 2022-03-18: 10 mL via INTRAVENOUS

## 2022-03-18 MED ORDER — LORAZEPAM 2 MG/ML IJ SOLN: 0.5000 mg | Freq: Once | INTRAMUSCULAR | Status: DC | PRN

## 2022-03-18 MED ORDER — LACTATED RINGERS IV SOLN: INTRAVENOUS | Status: DC

## 2022-03-18 MED ORDER — HYDROMORPHONE HCL 1 MG/ML IJ SOLN
1.0000 mg | Freq: Once | INTRAMUSCULAR | Status: AC
  Administered 2022-03-18: 1 mg via INTRAVENOUS
  Filled 2022-03-18: qty 1

## 2022-03-18 MED ORDER — HEPARIN SODIUM (PORCINE) 5000 UNIT/ML IJ SOLN
5000.0000 [IU] | Freq: Three times a day (TID) | INTRAMUSCULAR | Status: DC
  Administered 2022-03-18 – 2022-03-22 (×13): 5000 [IU] via SUBCUTANEOUS
  Filled 2022-03-18 (×13): qty 1

## 2022-03-18 MED ORDER — ACETAMINOPHEN 650 MG RE SUPP: 650.0000 mg | Freq: Four times a day (QID) | RECTAL | Status: DC | PRN

## 2022-03-18 MED ORDER — OMEPRAZOLE MAGNESIUM 20 MG PO TBEC: 40.0000 mg | DELAYED_RELEASE_TABLET | Freq: Two times a day (BID) | ORAL | Status: DC

## 2022-03-18 MED ORDER — ONDANSETRON HCL 4 MG/2ML IJ SOLN
4.0000 mg | Freq: Four times a day (QID) | INTRAMUSCULAR | Status: DC | PRN
  Administered 2022-03-18 – 2022-03-21 (×6): 4 mg via INTRAVENOUS
  Filled 2022-03-18 (×7): qty 2

## 2022-03-18 MED ORDER — ONDANSETRON HCL 4 MG/2ML IJ SOLN
4.0000 mg | Freq: Once | INTRAMUSCULAR | Status: AC
  Administered 2022-03-18: 4 mg via INTRAVENOUS
  Filled 2022-03-18: qty 2

## 2022-03-18 MED ORDER — PANTOPRAZOLE SODIUM 40 MG PO TBEC
80.0000 mg | DELAYED_RELEASE_TABLET | Freq: Two times a day (BID) | ORAL | Status: DC
  Administered 2022-03-18 – 2022-03-22 (×9): 80 mg via ORAL
  Filled 2022-03-18 (×9): qty 2

## 2022-03-18 MED ORDER — ACETAMINOPHEN 325 MG PO TABS
650.0000 mg | ORAL_TABLET | Freq: Four times a day (QID) | ORAL | Status: DC | PRN
  Administered 2022-03-19 – 2022-03-22 (×5): 650 mg via ORAL
  Filled 2022-03-18 (×5): qty 2

## 2022-03-18 MED ORDER — DOCUSATE SODIUM 100 MG PO CAPS
100.0000 mg | ORAL_CAPSULE | Freq: Two times a day (BID) | ORAL | Status: DC
  Administered 2022-03-18 – 2022-03-22 (×8): 100 mg via ORAL
  Filled 2022-03-18 (×8): qty 1

## 2022-03-18 MED ORDER — HYDROMORPHONE HCL 1 MG/ML IJ SOLN
0.5000 mg | INTRAMUSCULAR | Status: DC | PRN
  Administered 2022-03-18 – 2022-03-20 (×19): 1 mg via INTRAVENOUS
  Administered 2022-03-21: 0.5 mg via INTRAVENOUS
  Administered 2022-03-21 – 2022-03-22 (×11): 1 mg via INTRAVENOUS
  Filled 2022-03-18 (×33): qty 1

## 2022-03-18 NOTE — TOC CM/SW Note (Signed)
Transition of Care [facility]) Screening Note  Patient Details  Name: Andre Simmons Date of Birth: January 05, 1976  Transition of Care [facility] CM/SW Contact:    Sherie Don, LCSW Phone Number: 03/18/2022, 10:03 AM  Transition of Care Department [facility]) has reviewed patient and no TOC needs have been identified at this time. We will continue to monitor patient advancement through interdisciplinary progression rounds. If new patient transition needs arise, please place a TOC consult.

## 2022-03-18 NOTE — Progress Notes (Signed)
[facility]  PROGRESS NOTE  Andre Simmons VOH:[id number] DOB: [birth year] DOA: 03/17/2022 PCP: Maryella Shivers, MD   Brief HPI:   47 year old male with history of neuroendocrine pancreatic cancer s/p partial pancreatectomy in 2019 by Dr. Barry Dienes presented with abdominal pain.  Patient has had workup for pancreatitis in the past including IgG4 which was normal.  He also had genetic testing which was negative.  He saw general surgery in the past and surgery had recommended MRCP He gets pancreatitis. Lipase was mildly elevated at 62 CT abdomen showed mild peripancreatic inflammatory stranding within the head of pancreas. MRCP ordered   Subjective   Patient seen and examined, complains of epigastric pain.   Assessment/Plan:    Acute on chronic pancreatitis -Denies alcohol use, lipid profile from last year showed triglyceride 137 -Lipid profile during this admission is pending -MRCP with and without contrast ordered -Will also order abdominal ultrasound to rule out gallstones -Will consult gastroenterology for further recommendations   Hyperglycemia -Follow hemoglobin A1c    Medications     docusate sodium  100 mg Oral BID   heparin  5,000 Units Subcutaneous Q8H   omeprazole  40 mg Oral BID AC     Data Reviewed:   CBG:  No results for input(s): "GLUCAP" in the last 168 hours.  SpO2: 98 %    Vitals:   03/18/22 0130 03/18/22 0200 03/18/22 0308 03/18/22 0633  BP:  118/81 (!) 137/109 122/87  Pulse:  67 75 64  Resp:  '17 16 16  '$ Temp: 98.1 F (36.7 C)  98.3 F (36.8 C) 98.2 F (36.8 C)  TempSrc: Oral  Oral Oral  SpO2:  94% 93% 98%  Weight:      Height:          Data Reviewed:  Basic Metabolic Panel: Recent Labs  Lab 03/17/22 2143  NA 134*  K 3.7  CL 101  CO2 23  GLUCOSE 154*  BUN 9  CREATININE 0.96  CALCIUM 8.4*    CBC: Recent Labs  Lab 03/17/22 2143  WBC 15.0*  HGB 13.5  HCT 39.6  MCV 93.6  PLT 410*    LFT Recent Labs  Lab  03/17/22 2143  AST 29  ALT 50*  ALKPHOS 75  BILITOT 0.2*  PROT 7.7  ALBUMIN 3.9     Antibiotics: Anti-infectives (From admission, onward)    None        DVT prophylaxis: Heparin  Code Status: Full code  Family Communication:    CONSULTS gastroenterology   Objective    Physical Examination:   General-appears in no acute distress Heart-S1-S2, regular, no murmur auscultated Lungs-clear to auscultation bilaterally, no wheezing or crackles auscultated Abdomen-soft, positive epigastric tenderness to palpation Extremities-no edema in the lower extremities Neuro-alert, oriented x3, no focal deficit noted  Status is: Inpatient:             [place]   [facility] If 7PM-7AM, please contact night-coverage at [email or web address], Office  [phone]   03/18/2022, 8:51 AM  LOS: 0 days

## 2022-03-18 NOTE — Assessment & Plan Note (Signed)
Admit to observation MedSurg bed.  Continue with IV fluids.  As needed IV Dilaudid 1 mg.  Have reviewed Dr. Marlowe Aschoff outpatient office visit from July 2023.  Will go ahead and order requested MRCP with and without contrast.  Keep n.p.o.  Patient is well aware of the natural progression of admission for pancreatitis.  Repeat lipase, chemistries in the morning.

## 2022-03-18 NOTE — Subjective & Objective (Addendum)
CC: abd pain HPI: 48 year old male with a prior history of neuro endocrine pancreatic cancer status post partial pancreatectomy in 2019 by Dr. Channing Mutters presents to the ER today with onset of abdominal pain starting yesterday.  He states that he woke up with abdominal pain.  He states that he has had this before.  He states that normally he is able to keep himself n.p.o. or only on clear liquids and pancreatitis usually resolves.  He has had an extensive workup including IgG4 which was normal.  He also states he has had genetic testing which was negative as he has had family members that have passed from pancreatic cancer.  He was last seen by Dr. Barry Dienes with [facility] in July 2023 at that time Dr. Barry Dienes recommended MRCP with and without contrast the next time he has a bout of pancreatitis.  Patient does not drink alcohol.  He states that food is not correlated with his bouts of pancreatitis.  Lipase was mildly elevated at 62.  CT abdomen pelvis demonstrates mild peripancreatic inflammatory stranding within the head of the pancreas.  No gallstones or gallbladder wall thickening were seen.  Patient given multiple doses of IV Dilaudid without resolution of his pain.  He was started on IV fluids.  [facility] contacted for admission.

## 2022-03-18 NOTE — Assessment & Plan Note (Signed)
No history of diabetes. Could be due to stress of pancreatitis. Check A1c.

## 2022-03-18 NOTE — Progress Notes (Signed)
MRI department called up and wanted nurse to ask pt if he is claustrophobic. Pt stated that he was, when it comes to having MRIs done. They asked that the nurse try to get an order for medication to help him with this test. Nurse messaged J. Olena Heckle, NP, in regards to this. No orders received currently. Awaiting.

## 2022-03-18 NOTE — Progress Notes (Signed)
Pt h as arrived to room 1312 by [facility]. Paged on call hospitalist, them of pt's arrival to room 1312.

## 2022-03-18 NOTE — Consult Note (Addendum)
Consultation  Referring Provider:   Dr. Jakeb Lamping Bray Primary Care Physician:  Maryella Shivers, MD Primary Gastroenterologist:  Dr. Ardis Hughs       Reason for Consultation:     recurrent pancreatitis   Impression    Acute on chronic pancreatitis ([place])  No metabolic derangements to account for the pancreatitis. Medications reviewed as potential cause of pancreatitis, none identified.  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   Pending MRCP, if this shows any irregularities or ductal dilatation we will plan on discussing with our endoscopy specialist for possible EUS.  Ultimately may benefit from cholecystectomy Can consider amlodipine once daily for prevention of recurrent acute pancreatitis in the mean time.    - IVF -Pain control per the inpatient medical team. -Encourage early ambulation -Advance diet as tolerated after MRCP  -If the patient not tolerating an oral diet at 72 hours would recommend consideration of nutritional support with a nasogastric or nasojejunal postpyloric feedings. Can treat with Creon when patient is able to eat/drink again Due for screening colonoscopy at some time this year, can address outpatient.  Further recommendations per Dr. Fuller Plan  Thank you for your kind consultation, we will continue to follow.   Attending Physician Note   I have taken a history, reviewed the chart and examined the patient. I performed a substantive portion of this encounter, including complete performance of at least one of the key components, in conjunction with the APP. I agree with the APP's note, impression and  recommendations with my edits. My additional impressions and recommendations are as follows.   Acute on chronic pancreatitis. Prior evaluation negative. 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 Initial recommendations: IVF, pain control and MRI/MRCP.   [name], MD [facility] See AMION, [facility], for our on call provider            HPI:   Andre Simmons is a 47 y.o. male with past medical history significant for depression, pseudopapillary tumor tail of pancreas resected 2019 by Dr. Barry Dienes, recurrent acute pancreatitis since that time, presents with acute pancreatitis.  09/25/2017 EUS Dr. Ardis Hughs normal esophagus, stomach and duodenum 3.5 cm solid mass tail of pancreas normal main pancreatic duct, no adenopathy. Patient had distal pancreatectomy and splenectomy October 2019, Patient has had normal IgG4 levels, normal triglycerides, nondrinker, non smoker, no medications, no stones in gallbladder genetic testing unremarkable February 2023. Since that time he has had documented pancreatitis 05/2018 01/2021, and presents with AB pain.   Patient states even since just his hospitalization in December he has had 2 or 3 episodes at home that he states he has epigastric pain with nausea, last 2 to 3 days will help by limiting p.o. intake drinking only water and taking Motrin and Tylenol.  States there is no inciting episodes or pattern that he can see. Yesterday a.m. started with chest and epigastric pain progressively got worse throughout the day going from a pain score of 3-10.  This time it did have radiation to the back.  Was having nausea did vomit 3 times bilious vomit no emesis.  Patient's had chills but no fever. Patient was on turmeric for 2 months for his back pain but stopped 2 to 4 weeks ago, takes Motrin once a month for back or headaches.  Does not smoke, no alcohol for at least over 2 years.  No drug use.  No other supplements or over-the-counter  medications. Patient states since his surgery will have multiple bowel movements daily soft formed stools usually 30 minutes an hour after a meal with abdominal bloating. Patient's had reflux since his 78s, on 20 mg Prilosec twice daily as long as he takes his Prilosec denies GERD, dysphagia. Patient questions if him being conceived and born at [facility] and living there for a while could be contributing to his issues.  CT abdomen pelvis diffuse fatty infiltration liver no gallstones no gallbladder wall thickening biliary dilatation.  Pancreas shows postsurgical changes, mild peripancreatic inflammation ductal dilatation. Pending MRCP Labs on admission sodium 134, glucose 154, lipase 62, AST 29, total bilirubin 0.2, WBC 15  Abnormal ED labs: Abnormal Labs Reviewed  LIPASE, BLOOD - Abnormal; Notable for the following components:      Result Value   Lipase 62 (*)    All other components within normal limits  COMPREHENSIVE METABOLIC PANEL - Abnormal; Notable for the following components:   Sodium 134 (*)    Glucose, Bld 154 (*)    Calcium 8.4 (*)    ALT 50 (*)    Total Bilirubin 0.2 (*)    All other components within normal limits  CBC - Abnormal; Notable for the following components:   WBC 15.0 (*)    Platelets 410 (*)    All other components within normal limits  URINALYSIS, ROUTINE W REFLEX MICROSCOPIC - Abnormal; Notable for the following components:   Hgb urine dipstick TRACE (*)    All other components within normal limits  URINALYSIS, MICROSCOPIC (REFLEX) - Abnormal; Notable for the following components:   Bacteria, UA FEW (*)    All other components within normal limits     Past Medical History:  Diagnosis Date   Arthritis    Chronic back pain    Depression    Family history of breast cancer    Family history of pancreatic cancer    GERD (gastroesophageal reflux disease)    Knee pain    Pancreatic tumor     Surgical History:  He  has a past surgical history  that includes Knee surgery (Left); Back surgery (03/2018); Mouth surgery; EUS (N/A, 09/25/2017); Fine needle aspiration (09/25/2017); Laparoscopic distal pancreatectomy (11/26/2017); Pancreatectomy (N/A, 11/25/2017); and Removal of Spleen (2019). Family History:  His family history includes Breast cancer (age of onset: 34) in his maternal grandmother; Esophageal cancer in his paternal grandmother; Esophageal cancer (age of onset: 69) in his maternal grandfather and maternal grandmother; Healthy in his half-sister; Lung cancer in his maternal aunt; Other in his father; Pancreatic cancer in his maternal uncle; Thyroid cancer (age of onset: 55) in his mother. Social History:   reports that he has quit smoking. He has never used smokeless tobacco. He reports current drug use. Drug: Psilocybin. He reports that he does not drink alcohol.  Prior to Admission medications   Medication Sig Start Date End Date Taking? Authorizing Provider  ibuprofen (ADVIL) 200 MG tablet Take 400 mg by mouth every 6 (six) hours as needed for mild pain or headache.   Yes [provider]  Multiple Vitamins-Minerals (MENS MULTIPLUS PO) Take 1 tablet by mouth daily.   Yes [provider]  omeprazole (PRILOSEC OTC) 20 MG tablet Take 40 mg by mouth in the morning and at bedtime.   Yes [provider]    Current Facility-Administered Medications  Medication Dose Route Frequency Provider Last Rate Last Admin   acetaminophen (TYLENOL) tablet 650 mg  650 mg Oral Q6H PRN Kristopher Oppenheim, DO       Or   acetaminophen (TYLENOL) suppository 650 mg  650 mg Rectal Q6H PRN Kristopher Oppenheim, DO       docusate sodium (COLACE) capsule 100 mg  100 mg Oral BID Kristopher Oppenheim, DO       heparin injection 5,000 Units  5,000 Units Subcutaneous Q8H Kristopher Oppenheim, DO       HYDROmorphone (DILAUDID) injection 0.5-1 mg  0.5-1 mg Intravenous Q2H PRN Kristopher Oppenheim, DO   1 mg at 03/18/22 2025   lactated ringers infusion   Intravenous Continuous Kristopher Oppenheim,  DO 100 mL/hr at 03/18/22 0450 New Bag at 03/18/22 0450   LORazepam (ATIVAN) injection 0.5 mg  0.5 mg Intravenous Once PRN Kathryne Eriksson, NP       LORazepam (ATIVAN) injection 1 mg  1 mg Intravenous Once Oswald Hillock, MD       omeprazole (PRILOSEC OTC) EC tablet 40 mg  40 mg Oral BID AC Kristopher Oppenheim, DO       ondansetron [facility]) injection 4 mg  4 mg Intravenous Q6H PRN Oswald Hillock, MD        Allergies as of 03/17/2022 - Review Complete 03/17/2022  Allergen Reaction Noted   Adhesive [tape] Rash and Other (See Comments) 12/01/2018    Review of Systems:    Constitutional: No weight loss, fever, chills, weakness or fatigue HEENT: Eyes: No change in vision               Ears, Nose, Throat:  No change in hearing or congestion Skin: No rash or itching Cardiovascular: No chest pain, chest pressure or palpitations   Respiratory: No SOB or cough Gastrointestinal: See HPI and otherwise negative Genitourinary: No dysuria or change in urinary frequency Neurological: No headache, dizziness or syncope Musculoskeletal: No new muscle or joint pain Hematologic: No bleeding or bruising Psychiatric: No history of depression or anxiety     Physical Exam:  Vital signs in last 24 hours: Temp:  [97.6 F (36.4 C)-99.2 F (37.3 C)] 97.6 F (36.4 C) (01/22 0932) Pulse Rate:  [62-87] 67 (01/22 0932) Resp:  [15-17] 16 (01/22 0932) BP: (118-145)/(79-109) 138/79 (01/22 0932) SpO2:  [93 %-98 %] 94 % (01/22 0932) Weight:  [108.9 kg] 108.9 kg (01/21 2136) Last BM Date : 03/17/22 Last BM recorded by nurses in past 5 days No data recorded  General:   Pleasant, well developed male in no acute distress Head:  Normocephalic and atraumatic. Eyes: sclerae anicteric,conjunctive pink  Heart:  regular rate and rhythm Pulm: Clear anteriorly; no wheezing Abdomen:  Soft, Obese AB, Sluggish bowel sounds. mild tenderness in the epigastrium. Without guarding and Without rebound, No organomegaly  appreciated. Extremities:  Without edema. Msk:  Symmetrical without gross deformities. Peripheral pulses intact.  Neurologic:  Alert and  oriented x4;  No focal deficits.  Skin:   Dry and intact without significant lesions or rashes. Psychiatric:  Cooperative. Normal mood and affect.  LAB RESULTS: Recent Labs    03/17/22 2143  WBC 15.0*  HGB 13.5  HCT 39.6  PLT 410*   BMET Recent Labs    03/17/22 2143  NA 134*  K 3.7  CL 101  CO2 23  GLUCOSE 154*  BUN 9  CREATININE 0.96  CALCIUM 8.4*   LFT Recent Labs    03/17/22 2143  PROT 7.7  ALBUMIN 3.9  AST 29  ALT 50*  ALKPHOS 75  BILITOT 0.2*   PT/INR No results for input(s): "LABPROT", "INR" in the last 72 hours.  STUDIES: CT ABDOMEN PELVIS W CONTRAST  Result Date: 03/17/2022 CLINICAL DATA:  Left upper quadrant abdominal pain. EXAM: CT ABDOMEN AND PELVIS WITH CONTRAST TECHNIQUE: Multidetector CT imaging of the abdomen and pelvis was performed using the standard protocol following bolus administration of intravenous contrast. RADIATION DOSE REDUCTION: This exam was performed according to the departmental dose-optimization program which includes automated exposure control, adjustment of the mA and/or kV according to patient size and/or use of iterative reconstruction technique. CONTRAST:  11m OMNIPAQUE IOHEXOL 300 MG/ML  SOLN COMPARISON:  February 04, 2021 FINDINGS: Lower chest: No acute abnormality. Hepatobiliary: There is diffuse fatty infiltration of the liver with areas of focal fatty sparing seen adjacent to the gallbladder fossa. No gallstones, gallbladder wall thickening, or biliary dilatation. Pancreas: Postsurgical changes are seen consistent with the patient's history of distal pancreatectomy. Mild peripancreatic inflammatory fat stranding is seen within the head of the pancreas. No pancreatic ductal dilatation is seen. Spleen: Normal in size without focal abnormality. Adrenals/Urinary Tract: Adrenal glands are  unremarkable. Kidneys are normal, without renal calculi, focal lesion, or hydronephrosis. Bladder is unremarkable. Stomach/Bowel: Stomach is within normal limits. Appendix appears normal. No evidence of bowel wall thickening, distention, or inflammatory changes. Vascular/Lymphatic: Aortic atherosclerosis. No enlarged abdominal or pelvic lymph nodes. Reproductive: Prostate is unremarkable. Other: No abdominal wall hernia or abnormality. No abdominopelvic ascites. Musculoskeletal: Postoperative changes are seen within the lower lumbar spine. IMPRESSION: 1. Mild acute pancreatitis. Correlation with pancreatic enzymes is recommended. 2. Hepatic steatosis. 3. Postsurgical changes consistent with the patient's history of distal pancreatectomy. 4. Aortic atherosclerosis. Aortic Atherosclerosis (ICD10-I70.0). Electronically Signed   By: TVirgina NorfolkM.D.   On: 03/17/2022 23:41     AVladimir Crofts 03/18/2022, 9:46 AM

## 2022-03-18 NOTE — ED Notes (Signed)
[facility] is here for transport.

## 2022-03-18 NOTE — ED Provider Notes (Signed)
Care of the patient assumed at the change of shift pending call back from Hospitalist. I spoke with Dr. Alcario Drought who will accept for admission.   Medical Decision Making Problems Addressed: Acute pancreatitis, unspecified complication status, unspecified pancreatitis type: acute illness or injury  Risk Prescription drug management. Parenteral controlled substances. Decision regarding hospitalization.      Truddie Hidden, MD 03/18/22 [id number]

## 2022-03-18 NOTE — H&P (Signed)
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 Channing Mutters presents to the ER today with onset of abdominal pain starting yesterday.  He states that he woke up with abdominal pain.  He states that he has had this before.  He states that normally he is able to keep himself n.p.o. or only on clear liquids and pancreatitis usually resolves.  He has had an extensive workup including IgG4 which was normal.  He also states he has had genetic testing which was negative as he has had family members that have passed from pancreatic cancer.  He was last seen by Dr. Barry Dienes with [facility] in July 2023 at that time Dr. Barry Dienes recommended MRCP with and without contrast the next time he has a bout of pancreatitis.  Patient does not drink alcohol.  He states that food is not correlated with his bouts of pancreatitis.  Lipase was mildly elevated at 62.  CT abdomen pelvis demonstrates mild peripancreatic inflammatory stranding within the head of the pancreas.  No gallstones or gallbladder wall thickening were seen.  Patient given multiple doses of IV Dilaudid without resolution of his pain.  He was started on IV fluids.  [facility] hospitalist contacted for admission.   ED Course: CT abd shows pancreatitis. Lipase 62  Review of Systems:  Review of Systems  Constitutional: Negative.   HENT: Negative.    Eyes: Negative.   Respiratory: Negative.    Cardiovascular: Negative.   Gastrointestinal:  Positive for abdominal pain and nausea. Negative for constipation and diarrhea.  Genitourinary: Negative.   Musculoskeletal: Negative.    Skin: Negative.   Neurological: Negative.   Endo/Heme/Allergies: Negative.   Psychiatric/Behavioral: Negative.    All other systems reviewed and are negative.   Past Medical History:  Diagnosis Date   Arthritis    Chronic back pain    Depression    Family history of breast cancer    Family history of pancreatic cancer    GERD (gastroesophageal reflux disease)    Knee pain    Pancreatic tumor     Past Surgical History:  Procedure Laterality Date   BACK SURGERY  03/2018   Dr Trenton Gammon.  L4-L5, S1 micro discectomy   EUS N/A 09/25/2017   Procedure: UPPER ENDOSCOPIC ULTRASOUND (EUS) LINEAR;  Surgeon: Milus Banister, MD;  Location: [facility];  Service: Endoscopy;  Laterality: N/A;  Radial and Linear   FINE NEEDLE ASPIRATION  09/25/2017   Procedure: FINE NEEDLE ASPIRATION (FNA) RADIAL;  Surgeon: Milus Banister, MD;  Location: [facility];  Service: Endoscopy;;   KNEE SURGERY Left    x 20 surgeries   LAPAROSCOPIC DISTAL PANCREATECTOMY  11/26/2017   Diagnostic laparoscopy, laparoscopic hand-assisted distal    MOUTH SURGERY     as a child, unsure if wisdom teeth or tonsils   PANCREATECTOMY N/A 11/25/2017   Procedure: LAPAROSCOPIC DISTAL PANCREATECTOMY AND SPLENECTOMY ERAS PATHWAY;  Surgeon: Stark Klein, MD;  Location: [place];  Service: General;  Laterality: N/A;   Removal of Spleen  2019     reports that he has quit smoking. He has never used smokeless tobacco. He reports current  drug use. Drug: Psilocybin. He reports that he does not drink alcohol.  Allergies  Allergen Reactions   Adhesive [Tape] Rash and Other (See Comments)    Skin irritation.    Family History  Problem Relation Age of Onset   Thyroid cancer Mother 58       papillary   Other Father        MVA at age 69   Lung cancer Maternal Aunt    Pancreatic cancer Maternal Uncle    Esophageal cancer Maternal Grandmother 80       smoker   Breast cancer Maternal Grandmother 41   Esophageal cancer Maternal  Grandfather 80       smoker   Esophageal cancer Paternal Grandmother    Healthy Half-Sister    Colon cancer Neg Hx    Rectal cancer Neg Hx     Prior to Admission medications   Medication Sig Start Date End Date Taking? Authorizing Provider  Multiple Vitamins-Minerals (MENS MULTIPLUS PO) Take 1 tablet by mouth daily.    [provider]  omeprazole (PRILOSEC OTC) 20 MG tablet Take 40 mg by mouth in the morning and at bedtime.    [provider]    Physical Exam: Vitals:   03/18/22 0100 03/18/22 0130 03/18/22 0200 03/18/22 0308  BP: (!) 129/95  118/81 (!) 137/109  Pulse: 62  67 75  Resp: '15  17 16  '$ Temp:  98.1 F (36.7 C)  98.3 F (36.8 C)  TempSrc:  Oral  Oral  SpO2: 93%  94% 93%  Weight:      Height:        Physical Exam Vitals and nursing note reviewed.  Constitutional:      General: He is not in acute distress.    Appearance: Normal appearance. He is normal weight. He is not ill-appearing, toxic-appearing or diaphoretic.  HENT:     Head: Normocephalic and atraumatic.     Nose: Nose normal.  Eyes:     Pupils: Pupils are equal, round, and reactive to light.  Cardiovascular:     Rate and Rhythm: Normal rate and regular rhythm.  Pulmonary:     Effort: Pulmonary effort is normal.     Breath sounds: Normal breath sounds.  Abdominal:     General: Bowel sounds are normal.     Palpations: Abdomen is soft.     Tenderness: There is abdominal tenderness in the epigastric area.  Musculoskeletal:     Right lower leg: No edema.     Left lower leg: No edema.  Skin:    General: Skin is warm and dry.     Capillary Refill: Capillary refill takes less than 2 seconds.  Neurological:     General: No focal deficit present.     Mental Status: He is alert and oriented to person, place, and time.      Labs on Admission: I have personally reviewed following labs and imaging studies  CBC: Recent Labs  Lab 03/17/22 2143  WBC 15.0*  HGB 13.5  HCT 39.6  MCV  93.6  PLT 938*   Basic Metabolic Panel: Recent Labs  Lab 03/17/22 2143  NA 134*  K 3.7  CL 101  CO2 23  GLUCOSE 154*  BUN 9  CREATININE 0.96  CALCIUM 8.4*   GFR: Estimated Creatinine Clearance: 127.7 mL/min (by C-G formula based on SCr of 0.96 mg/dL). Liver Function Tests: Recent Labs  Lab 03/17/22 2143  AST 29  ALT 50*  ALKPHOS 75  BILITOT 0.2*  PROT 7.7  ALBUMIN 3.9   Recent Labs  Lab 03/17/22 2143  LIPASE 62*   No results for input(s): "AMMONIA" in the last 168 hours. Coagulation Profile: No results for input(s): "INR", "PROTIME" in the last 168 hours. Cardiac Enzymes: No results for input(s): "CKTOTAL", "CKMB", "CKMBINDEX", "TROPONINI", "TROPONINIHS" in the last 168 hours. BNP (last 3 results) No results for input(s): "PROBNP" in the last 8760 hours. HbA1C: No results for input(s): "HGBA1C" in the last 72 hours. CBG: No results for input(s): "GLUCAP" in the last 168 hours. Lipid Profile: No results for input(s): "CHOL", "HDL", "LDLCALC", "TRIG", "CHOLHDL", "LDLDIRECT" in the last 72 hours. Thyroid Function Tests: No results for input(s): "TSH", "T4TOTAL", "FREET4", "T3FREE", "THYROIDAB" in the last 72 hours. Anemia Panel: No results for input(s): "VITAMINB12", "FOLATE", "FERRITIN", "TIBC", "IRON", "RETICCTPCT" in the last 72 hours. Urine analysis:    Component Value Date/Time   COLORURINE YELLOW 03/17/2022 2143   APPEARANCEUR CLEAR 03/17/2022 2143   LABSPEC 1.025 03/17/2022 2143   PHURINE 6.0 03/17/2022 2143   GLUCOSEU NEGATIVE 03/17/2022 2143   HGBUR TRACE (A) 03/17/2022 2143   BILIRUBINUR NEGATIVE 03/17/2022 2143   KETONESUR NEGATIVE 03/17/2022 2143   PROTEINUR NEGATIVE 03/17/2022 2143   UROBILINOGEN 0.2 09/19/2009 1400   NITRITE NEGATIVE 03/17/2022 2143   LEUKOCYTESUR NEGATIVE 03/17/2022 2143    Radiological Exams on Admission: I have personally reviewed images CT ABDOMEN PELVIS W CONTRAST  Result Date: 03/17/2022 CLINICAL DATA:  Left  upper quadrant abdominal pain. EXAM: CT ABDOMEN AND PELVIS WITH CONTRAST TECHNIQUE: Multidetector CT imaging of the abdomen and pelvis was performed using the standard protocol following bolus administration of intravenous contrast. RADIATION DOSE REDUCTION: This exam was performed according to the departmental dose-optimization program which includes automated exposure control, adjustment of the mA and/or kV according to patient size and/or use of iterative reconstruction technique. CONTRAST:  111m OMNIPAQUE IOHEXOL 300 MG/ML  SOLN COMPARISON:  February 04, 2021 FINDINGS: Lower chest: No acute abnormality. Hepatobiliary: There is diffuse fatty infiltration of the liver with areas of focal fatty sparing seen adjacent to the gallbladder fossa. No gallstones, gallbladder wall thickening, or biliary dilatation. Pancreas: Postsurgical changes are seen consistent with the patient's history of distal pancreatectomy. Mild peripancreatic inflammatory fat stranding is seen within the head of the pancreas. No pancreatic ductal dilatation is seen. Spleen: Normal in size without focal abnormality. Adrenals/Urinary Tract: Adrenal glands are unremarkable. Kidneys are normal, without renal calculi, focal lesion, or hydronephrosis. Bladder is unremarkable. Stomach/Bowel: Stomach is within normal limits. Appendix appears normal. No evidence of bowel wall thickening, distention, or inflammatory changes. Vascular/Lymphatic: Aortic atherosclerosis. No enlarged abdominal or pelvic lymph nodes. Reproductive: Prostate is unremarkable. Other: No abdominal wall hernia or abnormality. No abdominopelvic ascites. Musculoskeletal: Postoperative changes are seen within the lower lumbar spine. IMPRESSION: 1. Mild acute pancreatitis. Correlation with pancreatic enzymes is recommended. 2. Hepatic steatosis. 3. Postsurgical changes consistent with the patient's history of distal pancreatectomy. 4. Aortic atherosclerosis. Aortic Atherosclerosis  (ICD10-I70.0). Electronically Signed   By: TVirgina NorfolkM.D.   On: 03/17/2022 23:41    EKG: My personal interpretation of EKG shows: no EKG  Assessment/Plan Principal Problem:   Acute on chronic pancreatitis ([facility] Active Problems:   Hyperglycemia   Assessment and Plan: * [place] Admit to observation MedSurg bed.  Continue with IV fluids.  As needed IV Dilaudid 1 mg.  Have reviewed Dr. BMarlowe Aschoffoutpatient office visit from July 2023.  Will go ahead and order requested  MRCP with and without contrast.  Keep n.p.o.  Patient is well aware of the natural progression of admission for pancreatitis.  Repeat lipase, chemistries in the morning.  Hyperglycemia No history of diabetes. Could be due to stress of pancreatitis. Check A1c.   DVT prophylaxis: SQ Heparin Code Status: Full Code Family Communication: no family at bedside  Disposition Plan: return home  Consults called: none  Admission status: Observation, Med-Surg   Kristopher Oppenheim, DO [facility] 03/18/2022, 4:55 AM

## 2022-03-19 DIAGNOSIS — K861 Other chronic pancreatitis: Secondary | ICD-10-CM | POA: Diagnosis not present

## 2022-03-19 DIAGNOSIS — K859 Acute pancreatitis without necrosis or infection, unspecified: Secondary | ICD-10-CM | POA: Diagnosis not present

## 2022-03-19 MED ORDER — AMLODIPINE BESYLATE 5 MG PO TABS
5.0000 mg | ORAL_TABLET | Freq: Every day | ORAL | Status: DC
  Administered 2022-03-19 – 2022-03-22 (×4): 5 mg via ORAL
  Filled 2022-03-19 (×4): qty 1

## 2022-03-19 NOTE — Progress Notes (Signed)
[facility]  PROGRESS NOTE  Andre Simmons VOH:[id number] DOB: 1975/12/24 DOA: 03/17/2022 PCP: Maryella Shivers, MD   Brief HPI:   47 year old male with history of neuroendocrine pancreatic cancer s/p partial pancreatectomy in 2019 by Dr. Barry Dienes presented with abdominal pain.  Patient has had workup for pancreatitis in the past including IgG4 which was normal.  He also had genetic testing which was negative.  He saw general surgery in the past and surgery had recommended MRCP He gets pancreatitis. Lipase was mildly elevated at 62 CT abdomen showed mild peripancreatic inflammatory stranding within the head of pancreas. MRCP ordered   Subjective   Patient seen and examined, denies pain.  MRI abdomen showed mild pancreatitis with no other abnormality.  Abdominal ultrasound showed dilated common bile duct with no stone.   Assessment/Plan:    Acute on chronic pancreatitis -Denies alcohol use, lipid profile from last year showed triglyceride 137 -Lipid profile during this admission is pending -MRCP with and without contrast showed mild inflammatory changes of the pancreatic head and neck and surrounding tissues consistent with pancreatitis.  Showed diffuse fatty infiltration -Abdominal ultrasound showed dilated common bile duct measuring 9 mm; concerning for biliary obstruction.  Again showed fatty infiltration of liver -Gastroenterology is following. -Will start amlodipine 5 mg daily to prevent recurrent pancreatitis as per GI recommendation  Hypertension -start amlodipine 5 mg daily   Hyperglycemia -Hemoglobin A1c 6.4    Medications     docusate sodium  100 mg Oral BID   heparin  5,000 Units Subcutaneous Q8H   pantoprazole  80 mg Oral BID     Data Reviewed:   CBG:  No results for input(s): "GLUCAP" in the last 168 hours.  SpO2: 96 %    Vitals:   03/18/22 1321 03/18/22 1806 03/18/22 1954 03/19/22 0623  BP: (!) 155/101 (!) 150/97 135/76 126/88  Pulse: 61 88  73 64  Resp: '16 16 18 16  '$ Temp: 97.7 F (36.5 C) 98.2 F (36.8 C) 97.8 F (36.6 C) 97.9 F (36.6 C)  TempSrc: Oral Oral Oral Oral  SpO2: 97% 97% 92% 96%  Weight:      Height:          Data Reviewed:  Basic Metabolic Panel: Recent Labs  Lab 03/17/22 2143 03/18/22 1026  NA 134* 137  K 3.7 4.0  CL 101 102  CO2 23 24  GLUCOSE 154* 121*  BUN 9 9  CREATININE 0.96 0.93  CALCIUM 8.4* 8.5*    CBC: Recent Labs  Lab 03/17/22 2143 03/18/22 1026  WBC 15.0* 12.5*  NEUTROABS  --  7.7  HGB 13.5 13.0  HCT 39.6 39.6  MCV 93.6 98.0  PLT 410* 493*    LFT Recent Labs  Lab 03/17/22 2143 03/18/22 1026  AST 29 25  ALT 50* 48*  ALKPHOS 75 68  BILITOT 0.2* 0.6  PROT 7.7 7.3  ALBUMIN 3.9 3.7     Antibiotics: Anti-infectives (From admission, onward)    None        DVT prophylaxis: Heparin  Code Status: Full code  Family Communication:    CONSULTS gastroenterology   Objective    Physical Examination:  General-appears in no acute distress Heart-S1-S2, regular, no murmur auscultated Lungs-clear to auscultation bilaterally, no wheezing or crackles auscultated Abdomen-soft, mild tenderness in epigastric region , no organomegaly Extremities-no edema in the lower extremities    Status is: Inpatient:             [place]  [facility] If 7PM-7AM, please contact night-coverage at [email or web address], Office  [phone]   03/19/2022, 8:51 AM  LOS: 1 day

## 2022-03-19 NOTE — Progress Notes (Addendum)
Progress Note  Primary GI: Dr. Ardis Hughs   Subjective  Chief Complaint:  recurrent pancreatitis   He states he is feeling better as long as he has his pain medications. No nausea or vomiting, had broth without issues but states that he would like to try other foods instead.  No BM, has felt need to have BM but just pass gas.     Objective   Vital signs in last 24 hours: Temp:  [97.8 F (36.6 C)-98.5 F (36.9 C)] 98.5 F (36.9 C) (01/23 1221) Pulse Rate:  [64-88] 66 (01/23 1221) Resp:  [16-18] 17 (01/23 1221) BP: (126-150)/(76-98) 138/98 (01/23 1221) SpO2:  [91 %-97 %] 91 % (01/23 1221) Last BM Date : 03/17/22 Last BM recorded by nurses in past 5 days No data recorded  General:   Pleasant, well developed male in no acute distress Heart:  regular rate and rhythm Pulm: Clear anteriorly; no wheezing Abdomen:  Soft, Obese AB, Sluggish bowel sounds. mild tenderness in the epigastrium. Without guarding and Without rebound, No organomegaly appreciated. Extremities:  Without edema. Msk:  Symmetrical without gross deformities. Peripheral pulses intact.  Neurologic:  Alert and  oriented x4;  No focal deficits.  Skin:   Dry and intact without significant lesions or rashes. Psychiatric:  Cooperative. Normal mood and affect.  Intake/Output from previous day: 01/22 0701 - 01/23 0700 In: 2741.7 [P.O.:600; I.V.:2141.7] Out: 0  Intake/Output this shift: Total I/O In: 480 [P.O.:480] Out: -   Studies/Results: MR ABDOMEN MRCP W WO CONTAST  Result Date: 03/18/2022 CLINICAL DATA:  Pancreatitis. History of distal pancreatectomy and splenectomy EXAM: MRI ABDOMEN WITHOUT AND WITH CONTRAST (INCLUDING MRCP) TECHNIQUE: Multiplanar multisequence MR imaging of the abdomen was performed both before and after the administration of intravenous contrast. Heavily T2-weighted images of the biliary and pancreatic ducts were obtained, and three-dimensional MRCP images were rendered by post processing.  CONTRAST:  76m GADAVIST GADOBUTROL 1 MMOL/ML IV SOLN COMPARISON:  CT 03/17/2022. Ultrasound 03/18/2022. MRI 02/07/2021. Multiple older exams as well. FINDINGS: Lower chest: No pleural effusion at the lung bases. Scattered breathing motion throughout the examination. Hepatobiliary: There is significant signal dropout on out of phase imaging in the liver consistent with diffuse fatty liver infiltration. Once again there are areas of sparing along the gallbladder fossa margin. No enhancing liver lesion. Patent portal vein. No intrahepatic biliary duct dilatation. The common duct measures 4 mm. Gallbladder is nondilated. No obvious filling defect. Pancreas: Again changes from distal pancreatectomy. There is some mild stranding and edema seen along the pancreatic head extending towards the second portion of the duodenal. Please correlate with clinical evidence of pancreatitis. Mild edema of the pancreas on T2 as well towards the head/neck region. Component of interstitial pancreatitis. Otherwise preserved enhancement and T1 signal. No well-defined fluid collections. Spleen:  Surgically absent. Adrenals/Urinary Tract: Adrenal glands are preserved. Kidneys are unremarkable. No enhancing mass or collecting system dilatation. Tiny Bosniak 1 cyst along the upper pole of the right kidney which is bright on T2, low on T1 does not show any enhancement as seen on coronal postcontrast series 21, image 44 measuring 8 mm. No specific imaging follow-up. This has been present since at least CT scan of 09/05/2017 as well. Stomach/Bowel: Visualized bowel is nondilated. This includes visualized portions of the stomach, small bowel and colon. Vascular/Lymphatic: Normal caliber aorta and IVC. Mild atherosclerotic changes along the aorta. Patent portal vein. Few prominent upper abdominal nodes are identified. Periceliac node for example on series 19, image  42 measures 2.0 x 1.0 cm. Going back to a study of December 2022 this node would  have measured 2.0 by 1.1 cm, not significantly changed. Other prominent nodes in the porta hepatis are also unchanged. Other:  No frank ascites.  No areas of restricted diffusion. Musculoskeletal: Surgical changes along the lower lumbar spine at the edge of the imaging field. IMPRESSION: Once again mild inflammatory changes of the pancreatic head and neck and surrounding tissues consistent with pancreatitis as provided in the history. No complicating features such as fluid collection, necrosis. Surgical changes of distal pancreatectomy and splenectomy. Diffuse fatty liver infiltration with areas of sparing along the gallbladder fossa margin. Electronically Signed   By: Jill Side M.D.   On: 03/18/2022 14:48   MR 3D Recon At Scanner  Result Date: 03/18/2022 CLINICAL DATA:  Pancreatitis. History of distal pancreatectomy and splenectomy EXAM: MRI ABDOMEN WITHOUT AND WITH CONTRAST (INCLUDING MRCP) TECHNIQUE: Multiplanar multisequence MR imaging of the abdomen was performed both before and after the administration of intravenous contrast. Heavily T2-weighted images of the biliary and pancreatic ducts were obtained, and three-dimensional MRCP images were rendered by post processing. CONTRAST:  17m GADAVIST GADOBUTROL 1 MMOL/ML IV SOLN COMPARISON:  CT 03/17/2022. Ultrasound 03/18/2022. MRI 02/07/2021. Multiple older exams as well. FINDINGS: Lower chest: No pleural effusion at the lung bases. Scattered breathing motion throughout the examination. Hepatobiliary: There is significant signal dropout on out of phase imaging in the liver consistent with diffuse fatty liver infiltration. Once again there are areas of sparing along the gallbladder fossa margin. No enhancing liver lesion. Patent portal vein. No intrahepatic biliary duct dilatation. The common duct measures 4 mm. Gallbladder is nondilated. No obvious filling defect. Pancreas: Again changes from distal pancreatectomy. There is some mild stranding and edema  seen along the pancreatic head extending towards the second portion of the duodenal. Please correlate with clinical evidence of pancreatitis. Mild edema of the pancreas on T2 as well towards the head/neck region. Component of interstitial pancreatitis. Otherwise preserved enhancement and T1 signal. No well-defined fluid collections. Spleen:  Surgically absent. Adrenals/Urinary Tract: Adrenal glands are preserved. Kidneys are unremarkable. No enhancing mass or collecting system dilatation. Tiny Bosniak 1 cyst along the upper pole of the right kidney which is bright on T2, low on T1 does not show any enhancement as seen on coronal postcontrast series 21, image 44 measuring 8 mm. No specific imaging follow-up. This has been present since at least CT scan of 09/05/2017 as well. Stomach/Bowel: Visualized bowel is nondilated. This includes visualized portions of the stomach, small bowel and colon. Vascular/Lymphatic: Normal caliber aorta and IVC. Mild atherosclerotic changes along the aorta. Patent portal vein. Few prominent upper abdominal nodes are identified. Periceliac node for example on series 19, image 42 measures 2.0 x 1.0 cm. Going back to a study of December 2022 this node would have measured 2.0 by 1.1 cm, not significantly changed. Other prominent nodes in the porta hepatis are also unchanged. Other:  No frank ascites.  No areas of restricted diffusion. Musculoskeletal: Surgical changes along the lower lumbar spine at the edge of the imaging field. IMPRESSION: Once again mild inflammatory changes of the pancreatic head and neck and surrounding tissues consistent with pancreatitis as provided in the history. No complicating features such as fluid collection, necrosis. Surgical changes of distal pancreatectomy and splenectomy. Diffuse fatty liver infiltration with areas of sparing along the gallbladder fossa margin. Electronically Signed   By: AJill SideM.D.   On: 03/18/2022 14:48  US Abdomen  Complete  Result Date: 03/18/2022 CLINICAL DATA:  Abdominal pain history of splenectomy. CT 03/17/2022 EXAM: ABDOMEN ULTRASOUND COMPLETE COMPARISON:  MRI February 07, 2021 and CT March 17, 2020. FINDINGS: Gallbladder: No gallstones or wall thickening visualized. No sonographic Murphy sign noted by sonographer. Common bile duct: Diameter: 9 mm Liver: No focal lesion identified. Diffusely increased parenchymal echogenicity. Portal vein is patent on color Doppler imaging with normal direction of blood flow towards the liver. IVC: No abnormality visualized. Pancreas: Visualized portion unremarkable. Spleen: Surgically absent. Right Kidney: Length: 10.6. Echogenicity within normal limits. No mass or hydronephrosis visualized. Left Kidney: Length: 10.7. Echogenicity within normal limits. No mass or hydronephrosis visualized. Abdominal aorta: No aneurysm visualized. Other findings: None. IMPRESSION: 1. Dilated common bile duct measuring 9 mm suggest correlation with laboratory values to exclude biliary obstruction if clinical concern for obstruction further evaluation with MRCP is recommended. 2. The liver is echogenic. This is a nonspecific finding but is most commonly seen with fatty infiltration of the liver. There are no obvious focal liver lesions. 3. Status post splenectomy. Electronically Signed   By: Dahlia Bailiff M.D.   On: 03/18/2022 12:05   CT ABDOMEN PELVIS W CONTRAST  Result Date: 03/17/2022 CLINICAL DATA:  Left upper quadrant abdominal pain. EXAM: CT ABDOMEN AND PELVIS WITH CONTRAST TECHNIQUE: Multidetector CT imaging of the abdomen and pelvis was performed using the standard protocol following bolus administration of intravenous contrast. RADIATION DOSE REDUCTION: This exam was performed according to the departmental dose-optimization program which includes automated exposure control, adjustment of the mA and/or kV according to patient size and/or use of iterative reconstruction technique. CONTRAST:   114m OMNIPAQUE IOHEXOL 300 MG/ML  SOLN COMPARISON:  February 04, 2021 FINDINGS: Lower chest: No acute abnormality. Hepatobiliary: There is diffuse fatty infiltration of the liver with areas of focal fatty sparing seen adjacent to the gallbladder fossa. No gallstones, gallbladder wall thickening, or biliary dilatation. Pancreas: Postsurgical changes are seen consistent with the patient's history of distal pancreatectomy. Mild peripancreatic inflammatory fat stranding is seen within the head of the pancreas. No pancreatic ductal dilatation is seen. Spleen: Normal in size without focal abnormality. Adrenals/Urinary Tract: Adrenal glands are unremarkable. Kidneys are normal, without renal calculi, focal lesion, or hydronephrosis. Bladder is unremarkable. Stomach/Bowel: Stomach is within normal limits. Appendix appears normal. No evidence of bowel wall thickening, distention, or inflammatory changes. Vascular/Lymphatic: Aortic atherosclerosis. No enlarged abdominal or pelvic lymph nodes. Reproductive: Prostate is unremarkable. Other: No abdominal wall hernia or abnormality. No abdominopelvic ascites. Musculoskeletal: Postoperative changes are seen within the lower lumbar spine. IMPRESSION: 1. Mild acute pancreatitis. Correlation with pancreatic enzymes is recommended. 2. Hepatic steatosis. 3. Postsurgical changes consistent with the patient's history of distal pancreatectomy. 4. Aortic atherosclerosis. Aortic Atherosclerosis (ICD10-I70.0). Electronically Signed   By: TVirgina NorfolkM.D.   On: 03/17/2022 23:41    Lab Results: Recent Labs    03/17/22 2143 03/18/22 1026  WBC 15.0* 12.5*  HGB 13.5 13.0  HCT 39.6 39.6  PLT 410* 493*   BMET Recent Labs    03/17/22 2143 03/18/22 1026  NA 134* 137  K 3.7 4.0  CL 101 102  CO2 23 24  GLUCOSE 154* 121*  BUN 9 9  CREATININE 0.96 0.93  CALCIUM 8.4* 8.5*   LFT Recent Labs    03/18/22 1026  PROT 7.3  ALBUMIN 3.7  AST 25  ALT 48*  ALKPHOS 68   BILITOT 0.6   PT/INR No results for input(s): "LABPROT", "INR" in  the last 72 hours.   Scheduled Meds:  amLODipine  5 mg Oral Daily   docusate sodium  100 mg Oral BID   heparin  5,000 Units Subcutaneous Q8H   pantoprazole  80 mg Oral BID   Continuous Infusions:  lactated ringers 100 mL/hr at 03/19/22 0102      Patient profile:   47 y.o. male with past medical history significant for depression, pseudopapillary tumor tail of pancreas resected 2019 by Dr. Barry Dienes, recurrent acute pancreatitis since that time, presents with acute pancreatitis    Impression/Plan:   Acute on chronic pancreatitis ([place])  No metabolic derangements to account for the pancreatitis. Medications reviewed as potential cause of pancreatitis, none identified.  IgG4 negative, negative genetic testing negative trigs, non smoker, no ETOH AB US showed dilated CBD 9 mm, fatty liver, no liver lesions, s/p splenectomy MRCP shows inflammatory  changes pancreatic head/neck and surrounding tissues, no complicating features, no necrosis, CBD measures 4 mm, GB non dilated without filling defect. Diffuse fatty liver. Send MRCP to our advanced biliary specialist Started on amlodipine 5 mg daily for hypertension as well as for recurrent pancreatitis prevention Continue supportive care with IVF, pain control Currently diet is clear liquid, will go to soft low fat diet per patient request for soup, if he has AB pain/nausea/vomiting go down to full liquid diet Consider miralax if no BM's Encourage early ambulation Would suggest outpatient follow-up with Dr. Barry Dienes to discuss elective cholecystectomy   Pseudopapillary tumor tail of pancreas  resected 2019 by Dr. Barry Dienes  Leukocytosis 15 to 12.5, platlets 493 Afebrile   GERD On PPI BID EUS 2019 normal stomach, esophagus, and duodenum  Hyperglycemia  A1C 6.4 ( 6.7) From chronic pancreatitis Per primary team   LOS: 1 day   Andre Simmons  03/19/2022, 1:56  PM   Attending Physician Note   I have taken an interval history, reviewed the chart and examined the patient. I performed a substantive portion of this encounter, including complete performance of at least one of the key components, in conjunction with the APP. I agree with the APP's note, impression and recommendations with my edits. My additional impressions and recommendations are as follows.   Acute on chronic pancreatitis with prior evaluation negative.  Symptoms improving. WBC decreasing. Patient wanted to advance to soft, low fat diet. If diet is tolerated with adequate pain control consider discharge tomorrow.   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  GERD. Continue pantoprazole bid.   [name], MD [facility] See AMION, [facility], for our on call provider

## 2022-03-20 DIAGNOSIS — K861 Other chronic pancreatitis: Secondary | ICD-10-CM | POA: Diagnosis not present

## 2022-03-20 DIAGNOSIS — R739 Hyperglycemia, unspecified: Secondary | ICD-10-CM | POA: Diagnosis not present

## 2022-03-20 DIAGNOSIS — K859 Acute pancreatitis without necrosis or infection, unspecified: Secondary | ICD-10-CM | POA: Diagnosis not present

## 2022-03-20 LAB — COMPREHENSIVE METABOLIC PANEL
ALT: 43 U/L (ref 0–44)
AST: 24 U/L (ref 15–41)
Albumin: 4.1 g/dL (ref 3.5–5.0)
Alkaline Phosphatase: 81 U/L (ref 38–126)
Anion gap: 11 (ref 5–15)
BUN: 10 mg/dL (ref 6–20)
CO2: 26 mmol/L (ref 22–32)
Calcium: 9 mg/dL (ref 8.9–10.3)
Chloride: 98 mmol/L (ref 98–111)
Creatinine, Ser: 0.95 mg/dL (ref 0.61–1.24)
GFR, Estimated: >60 mL/min (ref >60)
Glucose, Bld: 89 mg/dL (ref 70–99)
Potassium: 3.6 mmol/L (ref 3.5–5.1)
Sodium: 135 mmol/L (ref 135–145)
Total Bilirubin: 0.7 mg/dL (ref 0.3–1.2)
Total Protein: 8 g/dL (ref 6.5–8.1)

## 2022-03-20 LAB — CBC
HCT: 40.9 % (ref 39.0–52.0)
Hemoglobin: 13.8 g/dL (ref 13.0–17.0)
MCH: 31.8 pg (ref 26.0–34.0)
MCHC: 33.7 g/dL (ref 30.0–36.0)
MCV: 94.2 fL (ref 80.0–100.0)
Platelets: 515 10*3/uL — ABNORMAL HIGH (ref 150–400)
RBC: 4.34 MIL/uL (ref 4.22–5.81)
RDW: 13.4 % (ref 11.5–15.5)
WBC: 8.9 10*3/uL (ref 4.0–10.5)
nRBC: 0 % (ref 0.0–0.2)

## 2022-03-20 MED ORDER — POLYETHYLENE GLYCOL 3350 17 G PO PACK
17.0000 g | PACK | Freq: Every day | ORAL | Status: DC
  Administered 2022-03-20 – 2022-03-21 (×2): 17 g via ORAL
  Filled 2022-03-20 (×2): qty 1

## 2022-03-20 NOTE — Progress Notes (Addendum)
Patient diet was changed to full liquids d/t constant abdominal pain and nausea as per GI.

## 2022-03-20 NOTE — Progress Notes (Addendum)
Progress Note  Primary GI: Dr. Ardis Hughs   Subjective  Chief Complaint:  recurrent pancreatitis   No family at bedside.  With soft low fat diet had AB pain and nausea, no vomiting.  He is back on full liquid and feels better.  Still has not had a BM.      Objective   Vital signs in last 24 hours: Temp:  [97.9 F (36.6 C)-98.5 F (36.9 C)] 97.9 F (36.6 C) (01/24 0513) Pulse Rate:  [66-75] 67 (01/24 0513) Resp:  [16-17] 16 (01/24 0513) BP: (132-138)/(85-98) 134/94 (01/24 0513) SpO2:  [91 %-95 %] 95 % (01/24 0513) Weight:  [113.4 kg] 113.4 kg (01/24 0500) Last BM Date : 03/17/22 Last BM recorded by nurses in past 5 days No data recorded  General:   Pleasant, well developed male in no acute distress Heart:  regular rate and rhythm Pulm: Clear anteriorly; no wheezing Abdomen:  Soft, Obese AB, Sluggish bowel sounds. mild tenderness in the epigastrium. Without guarding and Without rebound, No organomegaly appreciated. Extremities:  Without edema. Msk:  Symmetrical without gross deformities. Peripheral pulses intact.  Neurologic:  Alert and  oriented x4;  No focal deficits.  Skin:   Dry and intact without significant lesions or rashes. Psychiatric:  Cooperative. Normal mood and affect.  Intake/Output from previous day: 01/23 0701 - 01/24 0700 In: 3734.3 [P.O.:1230; I.V.:2504.3] Out: -  Intake/Output this shift: Total I/O In: 520 [P.O.:520] Out: -   Studies/Results: MR ABDOMEN MRCP W WO CONTAST  Result Date: 03/18/2022 CLINICAL DATA:  Pancreatitis. History of distal pancreatectomy and splenectomy EXAM: MRI ABDOMEN WITHOUT AND WITH CONTRAST (INCLUDING MRCP) TECHNIQUE: Multiplanar multisequence MR imaging of the abdomen was performed both before and after the administration of intravenous contrast. Heavily T2-weighted images of the biliary and pancreatic ducts were obtained, and three-dimensional MRCP images were rendered by post processing. CONTRAST:  73m GADAVIST  GADOBUTROL 1 MMOL/ML IV SOLN COMPARISON:  CT 03/17/2022. Ultrasound 03/18/2022. MRI 02/07/2021. Multiple older exams as well. FINDINGS: Lower chest: No pleural effusion at the lung bases. Scattered breathing motion throughout the examination. Hepatobiliary: There is significant signal dropout on out of phase imaging in the liver consistent with diffuse fatty liver infiltration. Once again there are areas of sparing along the gallbladder fossa margin. No enhancing liver lesion. Patent portal vein. No intrahepatic biliary duct dilatation. The common duct measures 4 mm. Gallbladder is nondilated. No obvious filling defect. Pancreas: Again changes from distal pancreatectomy. There is some mild stranding and edema seen along the pancreatic head extending towards the second portion of the duodenal. Please correlate with clinical evidence of pancreatitis. Mild edema of the pancreas on T2 as well towards the head/neck region. Component of interstitial pancreatitis. Otherwise preserved enhancement and T1 signal. No well-defined fluid collections. Spleen:  Surgically absent. Adrenals/Urinary Tract: Adrenal glands are preserved. Kidneys are unremarkable. No enhancing mass or collecting system dilatation. Tiny Bosniak 1 cyst along the upper pole of the right kidney which is bright on T2, low on T1 does not show any enhancement as seen on coronal postcontrast series 21, image 44 measuring 8 mm. No specific imaging follow-up. This has been present since at least CT scan of 09/05/2017 as well. Stomach/Bowel: Visualized bowel is nondilated. This includes visualized portions of the stomach, small bowel and colon. Vascular/Lymphatic: Normal caliber aorta and IVC. Mild atherosclerotic changes along the aorta. Patent portal vein. Few prominent upper abdominal nodes are identified. Periceliac node for example on series 19, image 42 measures 2.0  x 1.0 cm. Going back to a study of December 2022 this node would have measured 2.0 by 1.1 cm,  not significantly changed. Other prominent nodes in the porta hepatis are also unchanged. Other:  No frank ascites.  No areas of restricted diffusion. Musculoskeletal: Surgical changes along the lower lumbar spine at the edge of the imaging field. IMPRESSION: Once again mild inflammatory changes of the pancreatic head and neck and surrounding tissues consistent with pancreatitis as provided in the history. No complicating features such as fluid collection, necrosis. Surgical changes of distal pancreatectomy and splenectomy. Diffuse fatty liver infiltration with areas of sparing along the gallbladder fossa margin. Electronically Signed   By: Jill Side M.D.   On: 03/18/2022 14:48   MR 3D Recon At Scanner  Result Date: 03/18/2022 CLINICAL DATA:  Pancreatitis. History of distal pancreatectomy and splenectomy EXAM: MRI ABDOMEN WITHOUT AND WITH CONTRAST (INCLUDING MRCP) TECHNIQUE: Multiplanar multisequence MR imaging of the abdomen was performed both before and after the administration of intravenous contrast. Heavily T2-weighted images of the biliary and pancreatic ducts were obtained, and three-dimensional MRCP images were rendered by post processing. CONTRAST:  15m GADAVIST GADOBUTROL 1 MMOL/ML IV SOLN COMPARISON:  CT 03/17/2022. Ultrasound 03/18/2022. MRI 02/07/2021. Multiple older exams as well. FINDINGS: Lower chest: No pleural effusion at the lung bases. Scattered breathing motion throughout the examination. Hepatobiliary: There is significant signal dropout on out of phase imaging in the liver consistent with diffuse fatty liver infiltration. Once again there are areas of sparing along the gallbladder fossa margin. No enhancing liver lesion. Patent portal vein. No intrahepatic biliary duct dilatation. The common duct measures 4 mm. Gallbladder is nondilated. No obvious filling defect. Pancreas: Again changes from distal pancreatectomy. There is some mild stranding and edema seen along the pancreatic head  extending towards the second portion of the duodenal. Please correlate with clinical evidence of pancreatitis. Mild edema of the pancreas on T2 as well towards the head/neck region. Component of interstitial pancreatitis. Otherwise preserved enhancement and T1 signal. No well-defined fluid collections. Spleen:  Surgically absent. Adrenals/Urinary Tract: Adrenal glands are preserved. Kidneys are unremarkable. No enhancing mass or collecting system dilatation. Tiny Bosniak 1 cyst along the upper pole of the right kidney which is bright on T2, low on T1 does not show any enhancement as seen on coronal postcontrast series 21, image 44 measuring 8 mm. No specific imaging follow-up. This has been present since at least CT scan of 09/05/2017 as well. Stomach/Bowel: Visualized bowel is nondilated. This includes visualized portions of the stomach, small bowel and colon. Vascular/Lymphatic: Normal caliber aorta and IVC. Mild atherosclerotic changes along the aorta. Patent portal vein. Few prominent upper abdominal nodes are identified. Periceliac node for example on series 19, image 42 measures 2.0 x 1.0 cm. Going back to a study of December 2022 this node would have measured 2.0 by 1.1 cm, not significantly changed. Other prominent nodes in the porta hepatis are also unchanged. Other:  No frank ascites.  No areas of restricted diffusion. Musculoskeletal: Surgical changes along the lower lumbar spine at the edge of the imaging field. IMPRESSION: Once again mild inflammatory changes of the pancreatic head and neck and surrounding tissues consistent with pancreatitis as provided in the history. No complicating features such as fluid collection, necrosis. Surgical changes of distal pancreatectomy and splenectomy. Diffuse fatty liver infiltration with areas of sparing along the gallbladder fossa margin. Electronically Signed   By: AJill SideM.D.   On: 03/18/2022 14:48   [place]  Abdomen Complete  Result Date: 03/18/2022 CLINICAL  DATA:  Abdominal pain history of splenectomy. CT 03/17/2022 EXAM: ABDOMEN ULTRASOUND COMPLETE COMPARISON:  MRI February 07, 2021 and CT March 17, 2020. FINDINGS: Gallbladder: No gallstones or wall thickening visualized. No sonographic Murphy sign noted by sonographer. Common bile duct: Diameter: 9 mm Liver: No focal lesion identified. Diffusely increased parenchymal echogenicity. Portal vein is patent on color Doppler imaging with normal direction of blood flow towards the liver. IVC: No abnormality visualized. Pancreas: Visualized portion unremarkable. Spleen: Surgically absent. Right Kidney: Length: 10.6. Echogenicity within normal limits. No mass or hydronephrosis visualized. Left Kidney: Length: 10.7. Echogenicity within normal limits. No mass or hydronephrosis visualized. Abdominal aorta: No aneurysm visualized. Other findings: None. IMPRESSION: 1. Dilated common bile duct measuring 9 mm suggest correlation with laboratory values to exclude biliary obstruction if clinical concern for obstruction further evaluation with MRCP is recommended. 2. The liver is echogenic. This is a nonspecific finding but is most commonly seen with fatty infiltration of the liver. There are no obvious focal liver lesions. 3. Status post splenectomy. Electronically Signed   By: Dahlia Bailiff M.D.   On: 03/18/2022 12:05    Lab Results: Recent Labs    03/17/22 2143 03/18/22 1026  WBC 15.0* 12.5*  HGB 13.5 13.0  HCT 39.6 39.6  PLT 410* 493*   BMET Recent Labs    03/17/22 2143 03/18/22 1026  NA 134* 137  K 3.7 4.0  CL 101 102  CO2 23 24  GLUCOSE 154* 121*  BUN 9 9  CREATININE 0.96 0.93  CALCIUM 8.4* 8.5*   LFT Recent Labs    03/18/22 1026  PROT 7.3  ALBUMIN 3.7  AST 25  ALT 48*  ALKPHOS 68  BILITOT 0.6   PT/INR No results for input(s): "LABPROT", "INR" in the last 72 hours.   Scheduled Meds:  amLODipine  5 mg Oral Daily   docusate sodium  100 mg Oral BID   heparin  5,000 Units Subcutaneous  Q8H   pantoprazole  80 mg Oral BID   Continuous Infusions:  lactated ringers 100 mL/hr at 03/20/22 1749      Patient profile:   47 y.o. male with past medical history significant for depression, pseudopapillary tumor tail of pancreas resected 2019 by Dr. Barry Dienes, recurrent acute pancreatitis since that time, presents with acute pancreatitis    Impression/Plan:   Acute on chronic pancreatitis ([place])  No metabolic derangements to account for the pancreatitis. Medications reviewed as potential cause of pancreatitis, none identified.  IgG4 negative, negative genetic testing negative trigs, non smoker, no ETOH AB US showed dilated CBD 9 mm, fatty liver, no liver lesions, s/p splenectomy MRCP shows inflammatory  changes pancreatic head/neck and surrounding tissues, no complicating features, no necrosis, CBD measures 4 mm, GB non dilated without filling defect. Diffuse fatty liver.  Continue supportive care with IVF, pain control Tried soft low fat diet but had nausea/AB pain, now at full liquid diet Will add on miralax for opioid induced constipation -consider nasojejunal postpyloric feedings. -consider Creon when patient is able to eat/drink again for symptoms Encourage early ambulation Would suggest outpatient follow-up with Dr. Barry Dienes to discuss elective cholecystectomy   Pseudopapillary tumor tail of pancreas  resected 2019 by Dr. Barry Dienes  Leukocytosis 15 to 12.5, platlets 493 Afebrile   GERD On PPI BID EUS 2019 normal stomach, esophagus, and duodenum  Hyperglycemia  A1C 6.4 ( 6.7) From chronic pancreatitis Per primary team  Hypertension On norvasc 5 mg Low grade  evidence of SOD, monitor and continue   LOS: 2 days   Vladimir Crofts  03/20/2022, 10:19 AM   Attending Physician Note   I have taken an interval history, reviewed the chart and examined the patient. I performed a substantive portion of this encounter, including complete performance of at least one of the  key components, in conjunction with the APP. I agree with the APP's note, impression and recommendations with my edits. My additional impressions and recommendations are as follows.   Acute on chronic pancreatitis with prior evaluation negative, symptoms improving. On full liquid diet. Did not tolerate soft diet.      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   GERD. Continue pantoprazole bid.   [name], MD [facility] See AM[facility], [facility], for our on call provider

## 2022-03-20 NOTE — Progress Notes (Signed)
[facility]  PROGRESS NOTE  MANAN OLMO YBW:[id number] DOB: 09/18/1975 DOA: 03/17/2022 PCP: Maryella Shivers, MD   Brief HPI:   47 year old male with history of neuroendocrine pancreatic cancer s/p partial pancreatectomy in 2019 by Dr. Barry Dienes presented with abdominal pain and was found to have acute on chronic pancreatitis based on CT abdomen findings (at time lipase was elevated at 62).    Patient has had workup for pancreatitis in the past including normal IgG4 w negative genetic testing, negative triglycerides, no alcohol/smoking history.  Underwent MRCP this hospital stay which shows inflammatory changes of pancreatic head and neck with no complicating features    MRCP inflammatory changes pancreatic head/neck and surrounding tissues, no complicating features, no necrosis, CBD measures 4 mm, GB non dilated without filling defect. Diffuse fatty liver    Subjective   Patient reports he tried to chicken balsamic vinaigrette dinner last night and did not do well and had worsening nausea and vomiting.  Since then has remained on full liquids (broth and coffee).  Still passing flatus, no bowel movement today yet.   Assessment/Plan:    Acute on chronic pancreatitis, unclear etiology - Appreciate GI recommendations, continue supportive care with IV fluids will continue pain control - Patient related to try to slowly advance to soft diet, if tolerates over the next 24 hours will consider discharge on 1/25 - Add MiraLAX for bowel regimen - May need NJ postpyloric feedings - Will consider Creon when patient is able to tolerate p.o. again for symptom control - Encourage patient ambulation and incentive spirometry use  Leukocytosis, resolved Peak of 15 on admission, currently 8, remains afebrile - Daily CBC  Hypertension -started amlodipine 5 mg daily in hospital   Hyperglycemia Hemoglobin A1c 6.4.  - outpatient testing for formal diagnosis of diabetes  GERD, stable -  Continue PPI twice daily  Medications     amLODipine  5 mg Oral Daily   docusate sodium  100 mg Oral BID   heparin  5,000 Units Subcutaneous Q8H   pantoprazole  80 mg Oral BID   polyethylene glycol  17 g Oral Daily     Data Reviewed:   CBG:  No results for input(s): "GLUCAP" in the last 168 hours.  SpO2: 93 %    Vitals:   03/19/22 2054 03/20/22 0500 03/20/22 0513 03/20/22 1130  BP: 132/85  (!) 134/94 (!) 132/93  Pulse: 75  67 74  Resp: '16  16 17  '$ Temp: 98.2 F (36.8 C)  97.9 F (36.6 C) 98.1 F (36.7 C)  TempSrc: Oral  Oral Oral  SpO2: 94%  95% 93%  Weight:  113.4 kg    Height:       Sitting in bed in no acute distress, resting comfortably Well-appearing male Normal respiratory effort on room air, normal breath sounds Abdomen soft, nondistended, mild tenderness in epigastric area, no rebound tenderness, diminished bowel sounds No peripheral edema   Data Reviewed:  Basic Metabolic Panel: Recent Labs  Lab 03/17/22 2143 03/18/22 1026 03/20/22 1051  NA 134* 137 135  K 3.7 4.0 3.6  CL 101 102 98  CO2 '23 24 26  '$ GLUCOSE 154* 121* 89  BUN '9 9 10  '$ CREATININE 0.96 0.93 0.95  CALCIUM 8.4* 8.5* 9.0     CBC: Recent Labs  Lab 03/17/22 2143 03/18/22 1026 03/20/22 1051  WBC 15.0* 12.5* 8.9  NEUTROABS  --  7.7  --   HGB 13.5 13.0 13.8  HCT 39.6 39.6 40.9  MCV 93.6  98.0 94.2  PLT 410* 493* 515*     LFT Recent Labs  Lab 03/17/22 2143 03/18/22 1026 03/20/22 1051  AST '29 25 24  '$ ALT 50* 48* 43  ALKPHOS 75 68 81  BILITOT 0.2* 0.6 0.7  PROT 7.7 7.3 8.0  ALBUMIN 3.9 3.7 4.1      Antibiotics: Anti-infectives (From admission, onward)    None        DVT prophylaxis: Heparin  Code Status: Full code  Family Communication:    CONSULTS gastroenterology   Objective    Physical Examination:  General-appears in no acute distress, lying in bed Heart-S1-S2, regular, no murmur auscultated Lungs-clear to auscultation bilaterally, no  wheezing or crackles auscultated Abdomen-soft, mild tenderness in epigastric region, no rebound tenderness, normal bowel sounds Extremities-no edema in the lower extremities    Status is: Inpatient:             Desiree Hane   [facility] If 7PM-7AM, please contact night-coverage at [email or web address], Office  [phone]   03/20/2022, 1:45 PM  LOS: 2 days

## 2022-03-21 DIAGNOSIS — K861 Other chronic pancreatitis: Secondary | ICD-10-CM | POA: Diagnosis not present

## 2022-03-21 DIAGNOSIS — K859 Acute pancreatitis without necrosis or infection, unspecified: Secondary | ICD-10-CM | POA: Diagnosis not present

## 2022-03-21 LAB — CBC
HCT: 40.8 % (ref 39.0–52.0)
Hemoglobin: 13.9 g/dL (ref 13.0–17.0)
MCH: 32.3 pg (ref 26.0–34.0)
MCHC: 34.1 g/dL (ref 30.0–36.0)
MCV: 94.9 fL (ref 80.0–100.0)
Platelets: 504 10*3/uL — ABNORMAL HIGH (ref 150–400)
RBC: 4.3 MIL/uL (ref 4.22–5.81)
RDW: 13.4 % (ref 11.5–15.5)
WBC: 9.7 10*3/uL (ref 4.0–10.5)
nRBC: 0 % (ref 0.0–0.2)

## 2022-03-21 NOTE — Progress Notes (Signed)
[facility]  PROGRESS NOTE  Andre Simmons QVZ:[id number] DOB: 08-27-1975 DOA: 03/17/2022 PCP: Maryella Shivers, MD   Brief HPI:   47 year old male with history of neuroendocrine pancreatic cancer s/p partial pancreatectomy in 2019 by Dr. Barry Dienes presented with abdominal pain and was found to have acute on chronic pancreatitis based on CT abdomen findings (at time lipase was elevated at 62).  Patient has had workup for pancreatitis in the past including normal IgG4 w negative genetic testing, negative triglycerides, no alcohol/smoking history.  Underwent MRCP this hospital stay which shows inflammatory changes of pancreatic head and neck with no complicating features  MRCP inflammatory changes pancreatic head/neck and surrounding tissues, no complicating features, no necrosis, CBD measures 4 mm, GB non dilated without filling defect. Diffuse fatty liver    Subjective   Patient has been comfortable bank on liquid diet, small loose BM last night. This AM seen with GI NP, he has no belly pain and GI plans to trial advancement of diet.   Assessment/Plan:   Acute on chronic pancreatitis, unclear etiology - Appreciate GI recommendations, continue supportive care with IV fluids will continue pain control - Per GI will advance diet again today - Added MiraLAX for bowel regimen - May need NJ postpyloric feedings pending tolerance of diet - Will consider Creon when patient is able to tolerate p.o. again for symptom control - Encourage patient ambulation and incentive spirometry use  Leukocytosis, resolved Peak of 15 on admission, currently 10, remains afebrile - Daily CBC  Hypertension -started amlodipine 5 mg daily in hospital  Hyperglycemia Hemoglobin A1c 6.4.  - outpatient testing for formal diagnosis of diabetes  GERD, stable - Continue PPI twice daily  Medications   amLODipine  5 mg Oral Daily   docusate sodium  100 mg Oral BID   heparin  5,000 Units Subcutaneous Q8H    pantoprazole  80 mg Oral BID   polyethylene glycol  17 g Oral Daily     Data Reviewed:   CBG:  No results for input(s): "GLUCAP" in the last 168 hours.  SpO2: 96 %    Vitals:   03/20/22 2148 03/21/22 0500 03/21/22 0530 03/21/22 0531  BP: (!) 134/98  122/68   Pulse: 64  (!) 56 (!) 51  Resp: 16  15   Temp: 98.3 F (36.8 C)  98.1 F (36.7 C)   TempSrc: Oral  Oral   SpO2: 98%  (!) 84% 96%  Weight:  110.6 kg    Height:       General:  Alert, oriented, looks a little anxious but pleasant, in no acute distress  Eyes: EOMI, clear conjuctivae, white sclerea Neck: supple, no masses, trachea mildline  Cardiovascular: RRR, no murmurs or rubs, no peripheral edema  Respiratory: clear to auscultation bilaterally, no wheezes, no crackles  Abdomen: soft, nontender, nondistended, normal bowel tones heard  Skin: dry, no rashes  Musculoskeletal: no joint effusions, normal range of motion  Psychiatric: appropriate affect, normal speech  Neurologic: extraocular muscles intact, clear speech, moving all extremities with intact sensorium  Data Reviewed:  Basic Metabolic Panel: Recent Labs  Lab 03/17/22 2143 03/18/22 1026 03/20/22 1051  NA 134* 137 135  K 3.7 4.0 3.6  CL 101 102 98  CO2 '23 24 26  '$ GLUCOSE 154* 121* 89  BUN '9 9 10  '$ CREATININE 0.96 0.93 0.95  CALCIUM 8.4* 8.5* 9.0     CBC: Recent Labs  Lab 03/17/22 2143 03/18/22 1026 03/20/22 1051 03/21/22 0420  WBC 15.0* 12.5*  8.9 9.7  NEUTROABS  --  7.7  --   --   HGB 13.5 13.0 13.8 13.9  HCT 39.6 39.6 40.9 40.8  MCV 93.6 98.0 94.2 94.9  PLT 410* 493* 515* 504*     LFT Recent Labs  Lab 03/17/22 2143 03/18/22 1026 03/20/22 1051  AST '29 25 24  '$ ALT 50* 48* 43  ALKPHOS 75 68 81  BILITOT 0.2* 0.6 0.7  PROT 7.7 7.3 8.0  ALBUMIN 3.9 3.7 4.1      Antibiotics: Anti-infectives (From admission, onward)    None        DVT prophylaxis: Heparin  Code Status: Full code  Family Communication:    CONSULTS  gastroenterology  Status is: Inpatient:    Shrey Boike [employer]   [facility] If 7PM-7AM, please contact night-coverage at [email or web address], Office  [phone]  03/21/2022, 10:22 AM  LOS: 3 days

## 2022-03-21 NOTE — Progress Notes (Addendum)
[facility] Progress Note  CC: Recurrent pancreatitis  Subjective: He had mild nausea last night, no nausea at this time. No vomiting. He tolerated a full liquid dinner yesterday, he wishes to try a soft diet. He passed a 1 loose brown/yellow stool yesterday.  Passing gas per the rectum this morning.  No chest pain or shortness of breath.   Objective:  Vital signs in last 24 hours: Temp:  [98.1 F (36.7 C)-98.3 F (36.8 C)] 98.1 F (36.7 C) (01/25 0530) Pulse Rate:  [51-74] 51 (01/25 0531) Resp:  [15-17] 15 (01/25 0530) BP: (122-134)/(68-98) 122/68 (01/25 0530) SpO2:  [84 %-98 %] 96 % (01/25 0531) Weight:  [110.6 kg] 110.6 kg (01/25 0500) Last BM Date : 03/20/22 General: Alert 47 year old male in no acute distress. Heart: Regular rhythm, no murmurs. Pulm: Breath sounds clear throughout. Abdomen: Soft. Mild distension. Nontender. Positive bowel sounds to all 4 quadrants. Extremities:  Without edema. Neurologic:  Alert and  oriented x 4. Grossly normal neurologically. Psych:  Alert and cooperative. Normal mood and affect.  Intake/Output from previous day: 01/24 0701 - 01/25 0700 In: 4120.2 [P.O.:1840; I.V.:2280.2] Out: 2300 [Urine:2300] Intake/Output this shift: No intake/output data recorded.  Lab Results: Recent Labs    03/18/22 1026 03/20/22 1051 03/21/22 0420  WBC 12.5* 8.9 9.7  HGB 13.0 13.8 13.9  HCT 39.6 40.9 40.8  PLT 493* 515* 504*   BMET Recent Labs    03/18/22 1026 03/20/22 1051  NA 137 135  K 4.0 3.6  CL 102 98  CO2 24 26  GLUCOSE 121* 89  BUN 9 10  CREATININE 0.93 0.95  CALCIUM 8.5* 9.0   LFT Recent Labs    03/20/22 1051  PROT 8.0  ALBUMIN 4.1  AST 24  ALT 43  ALKPHOS 48  BILITOT 0.7    Assessment / Plan:  20) 48 year old male with a history of pseudopapillary tumor to the tail of the pancreas s/p resection 2019 by Dr. Barry Dienes with recurrent pancreatis admitted to the hospital 03/17/2022 with acute pancreatitis per  CT.  Lipase 62.  ALT 50. Triglyceride level 72.  IgG4 98 on 02/09/2021. No alcohol use. CTAP 03/17/2022 showed mild acute pancreatitis and hepatic steatosis. Prior genetic testing was negative. Abd sono 1/22 showed a dilated CBD 9 mm, hepatic steatosis and s/p splenectomy.  MRCP 1/22 showed inflammatory changes pancreatic head/neck and surrounding tissues without necrosis, CBD 4 mm, GB non dilated without filling defect, hepatic steatosis, surgical changes of distal pancreatectomy and splenectomy. -Soft diet as tolerated -Continue supportive care with IVF, pain control -Ondansetron IV every 6 hours. -Ambulate as tolerated -Recommend outpatient follow-up with Dr. Barry Dienes to discuss elective cholecystectomy. -Await further recommendations per Dr. Fuller Plan   GERD Continue PPI BID EUS 2019 normal stomach, esophagus, and duodenum    LOS: 3 days   Noralyn Pick  03/21/2022, 10:39AM   Attending Physician Note   I have taken an interval history, reviewed the chart and examined the patient. I performed a substantive portion of this encounter, including complete performance of at least one of the key components, in conjunction with the APP. I agree with the APP's note, impression and recommendations with my edits. My additional impressions and recommendations are as follows.   Acute on chronic pancreatitis, resolving. On soft diet and patient requests removal of soft restriction - changed to heart healthy diet. If he remains stable and pain is controlled adequately on oral medications anticipate discharge tomorrow. Outpatient GI follow  up with Dr. Rush Landmark. GI signing off.    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   GERD. Continue pantoprazole bid  [name], MD [facility] See [facility], [facility], for our on call provider

## 2022-03-21 NOTE — Care Management Important Message (Signed)
Important Message  Patient Details IM Letter given. Name: Andre Simmons MRN: [id number] Date of Birth: Jul 22, 1975   Medicare Important Message Given:  Yes     Kerin Salen 03/21/2022, 3:35 PM

## 2022-03-22 DIAGNOSIS — K859 Acute pancreatitis without necrosis or infection, unspecified: Secondary | ICD-10-CM | POA: Diagnosis not present

## 2022-03-22 DIAGNOSIS — K861 Other chronic pancreatitis: Secondary | ICD-10-CM | POA: Diagnosis not present

## 2022-03-22 LAB — CBC
HCT: 40 % (ref 39.0–52.0)
Hemoglobin: 13.6 g/dL (ref 13.0–17.0)
MCH: 32.3 pg (ref 26.0–34.0)
MCHC: 34 g/dL (ref 30.0–36.0)
MCV: 95 fL (ref 80.0–100.0)
Platelets: 498 10*3/uL — ABNORMAL HIGH (ref 150–400)
RBC: 4.21 MIL/uL — ABNORMAL LOW (ref 4.22–5.81)
RDW: 13.2 % (ref 11.5–15.5)
WBC: 7.8 10*3/uL (ref 4.0–10.5)
nRBC: 0 % (ref 0.0–0.2)

## 2022-03-22 LAB — BASIC METABOLIC PANEL
Anion gap: 11 (ref 5–15)
BUN: 13 mg/dL (ref 6–20)
CO2: 28 mmol/L (ref 22–32)
Calcium: 8.9 mg/dL (ref 8.9–10.3)
Chloride: 98 mmol/L (ref 98–111)
Creatinine, Ser: 1.11 mg/dL (ref 0.61–1.24)
GFR, Estimated: >60 mL/min (ref >60)
Glucose, Bld: 101 mg/dL — ABNORMAL HIGH (ref 70–99)
Potassium: 4 mmol/L (ref 3.5–5.1)
Sodium: 137 mmol/L (ref 135–145)

## 2022-03-22 MED ORDER — OMEPRAZOLE MAGNESIUM 20 MG PO TBEC: 40.0000 mg | DELAYED_RELEASE_TABLET | Freq: Two times a day (BID) | ORAL | 0 refills | Status: DC

## 2022-03-22 MED ORDER — HYDROMORPHONE HCL 2 MG PO TABS
1.0000 mg | ORAL_TABLET | ORAL | Status: DC | PRN
  Administered 2022-03-22: 1 mg via ORAL
  Filled 2022-03-22: qty 1

## 2022-03-22 MED ORDER — HYDROMORPHONE HCL 2 MG PO TABS: 1.0000 mg | ORAL_TABLET | ORAL | 0 refills | Status: DC | PRN

## 2022-03-22 MED ORDER — POLYETHYLENE GLYCOL 3350 17 G PO PACK: 17.0000 g | PACK | Freq: Every day | ORAL | 0 refills | Status: DC

## 2022-03-22 MED ORDER — PANTOPRAZOLE SODIUM 40 MG PO TBEC: 80.0000 mg | DELAYED_RELEASE_TABLET | Freq: Two times a day (BID) | ORAL | 0 refills | Status: DC

## 2022-03-22 MED ORDER — AMLODIPINE BESYLATE 5 MG PO TABS: 5.0000 mg | ORAL_TABLET | Freq: Every day | ORAL | 0 refills | Status: DC

## 2022-03-22 MED ORDER — HYDROMORPHONE HCL 1 MG/ML IJ SOLN: 0.5000 mg | Freq: Four times a day (QID) | INTRAMUSCULAR | Status: DC | PRN

## 2022-03-22 MED ORDER — DOCUSATE SODIUM 100 MG PO CAPS: 100.0000 mg | ORAL_CAPSULE | Freq: Two times a day (BID) | ORAL | 0 refills | Status: DC

## 2022-03-22 NOTE — Progress Notes (Signed)
Patient was given discharge instructions, and all questions were answered.  Patient was stable for discharge and was taken to the main exit by wheelchair. 

## 2022-03-22 NOTE — Discharge Summary (Addendum)
Discharge Summary  Andre Simmons CNO:[id number] DOB: 11/18/1975  PCP: Maryella Shivers, MD  Admit date: 03/17/2022 Discharge date: 03/22/2022  Recommendations for Outpatient Follow-up:  Please follow up with your PCP with CBC and BMP in 1-2 weeks Follow up with GI in 3-4 weeks Follow up with Dr. Barry Dienes to discuss possible cholecystectomy  Discharge Diagnoses:  Active Hospital Problems   Diagnosis Date Noted   Acute on chronic pancreatitis ([place]) 03/18/2022   Hyperglycemia 03/18/2022   Acute pancreatitis 03/18/2022    Resolved Hospital Problems  No resolved problems to display.   Discharge Condition: Stable   Diet recommendation: Diet Orders (From admission, onward)     Start     Ordered   03/21/22 1629  Diet Heart Room service appropriate? Yes; Fluid consistency: Thin  Diet effective now       Question Answer Comment  Room service appropriate? Yes   Fluid consistency: Thin      03/21/22 1628           HPI and Brief Hospital Course:  47 year old male with history of neuroendocrine pancreatic cancer s/p partial pancreatectomy in 2019 by Dr. Barry Dienes presented with abdominal pain and was found to have acute on chronic pancreatitis based on CT abdomen findings (at time lipase was elevated at 62).  Patient has had workup for pancreatitis in the past including normal IgG4 w negative genetic testing, negative triglycerides, no alcohol/smoking history.  Underwent MRCP this hospital stay which shows inflammatory changes of pancreatic head and neck with no complicating features.He was followed by GI, his diet was gradually advanced. He tolerated regular diet and is ready for DC home today. Will follow up with GI as outpatient. Note he was started on Norvasc for hypertension during his hospital stay.  Consultations: GI  Discharge details, plan of care and follow up instructions were discussed with patient and any available family or care providers. Patient and family are in  agreement with discharge from the hospital today and all questions were answered to their satisfaction.  Discharge Exam: BP 123/82 (BP Location: Right Arm)   Pulse 69   Temp 97.8 F (36.6 C) (Oral)   Resp 18   Ht '6\' 2"'$  (1.88 m)   Wt 110.9 kg   SpO2 92%   BMI 31.39 kg/m  General:  Alert, oriented, calm, in no acute distress  Eyes: EOMI, clear sclerea Neck: supple, no masses, trachea mildline  Cardiovascular: RRR, no murmurs or rubs, no peripheral edema  Respiratory: clear to auscultation bilaterally, no wheezes, no crackles  Abdomen: soft, nontender, nondistended, normal bowel tones heard  Skin: dry, no rashes  Musculoskeletal: no joint effusions, normal range of motion  Psychiatric: appropriate affect, normal speech  Neurologic: extraocular muscles intact, clear speech, moving all extremities with intact sensorium   Discharge Instructions You were cared for by a hospitalist during your hospital stay. If you have any questions about your discharge medications or the care you received while you were in the hospital after you are discharged, you can call the unit and asked to speak with the hospitalist on call if the hospitalist that took care of you is not available. Once you are discharged, your primary care physician will handle any further medical issues. Please note that NO REFILLS for any discharge medications will be authorized once you are discharged, as it is imperative that you return to your primary care physician (or establish a relationship with a primary care physician if you do not have one) for your aftercare  needs so that they can reassess your need for medications and monitor your lab values.   Allergies as of 03/22/2022       Reactions   Adhesive [tape] Rash, Other (See Comments)   Skin irritation.   Reglan [metoclopramide] Anxiety        Medication List     STOP taking these medications    ibuprofen 200 MG tablet Commonly known as: ADVIL       TAKE  these medications    amLODipine 5 MG tablet Commonly known as: NORVASC Take 1 tablet (5 mg total) by mouth daily. Start taking on: March 23, 2022   docusate sodium 100 MG capsule Commonly known as: COLACE Take 1 capsule (100 mg total) by mouth 2 (two) times daily.   HYDROmorphone 2 MG tablet Commonly known as: DILAUDID Take 0.5 tablets (1 mg total) by mouth every 3 (three) hours as needed for severe pain.   MENS MULTIPLUS PO Take 1 tablet by mouth daily.   omeprazole 20 MG tablet Commonly known as: PRILOSEC OTC Take 2 tablets (40 mg total) by mouth in the morning and at bedtime.   polyethylene glycol 17 g packet Commonly known as: MIRALAX / GLYCOLAX Take 17 g by mouth daily. Start taking on: March 23, 2022       Allergies  Allergen Reactions   Adhesive [Tape] Rash and Other (See Comments)    Skin irritation.   Reglan [Metoclopramide] Anxiety     Follow-up Information     Maryella Shivers, MD Follow up in 2 week(s).   Specialty: Family Medicine Contact information: [place] [address] [phone]         Mansouraty, Telford Nab., MD. Schedule an appointment as soon as possible for a visit in 2 week(s).   Specialties: Gastroenterology, Internal Medicine Contact information: [place] [address] [phone]         Stark Klein, MD. Schedule an appointment as soon as possible for a visit in 4 week(s).   Specialty: General Surgery Contact information: [place] [phone]                  The results of significant diagnostics from this hospitalization (including imaging, microbiology, ancillary and laboratory) are listed below for reference.    Significant Diagnostic Studies: MR ABDOMEN MRCP W WO CONTAST  Result Date: 03/18/2022 CLINICAL DATA:  Pancreatitis. History of distal pancreatectomy and splenectomy EXAM: MRI ABDOMEN WITHOUT AND WITH  CONTRAST (INCLUDING MRCP) TECHNIQUE: Multiplanar multisequence MR imaging of the abdomen was performed both before and after the administration of intravenous contrast. Heavily T2-weighted images of the biliary and pancreatic ducts were obtained, and three-dimensional MRCP images were rendered by post processing. CONTRAST:  38m GADAVIST GADOBUTROL 1 MMOL/ML IV SOLN COMPARISON:  CT 03/17/2022. Ultrasound 03/18/2022. MRI 02/07/2021. Multiple older exams as well. FINDINGS: Lower chest: No pleural effusion at the lung bases. Scattered breathing motion throughout the examination. Hepatobiliary: There is significant signal dropout on out of phase imaging in the liver consistent with diffuse fatty liver infiltration. Once again there are areas of sparing along the gallbladder fossa margin. No enhancing liver lesion. Patent portal vein. No intrahepatic biliary duct dilatation. The common duct measures 4 mm. Gallbladder is nondilated. No obvious filling defect. Pancreas: Again changes from distal pancreatectomy. There is some mild stranding and edema seen along the pancreatic head extending towards the second portion of the duodenal. Please correlate  with clinical evidence of pancreatitis. Mild edema of the pancreas on T2 as well towards the head/neck region. Component of interstitial pancreatitis. Otherwise preserved enhancement and T1 signal. No well-defined fluid collections. Spleen:  Surgically absent. Adrenals/Urinary Tract: Adrenal glands are preserved. Kidneys are unremarkable. No enhancing mass or collecting system dilatation. Tiny Bosniak 1 cyst along the upper pole of the right kidney which is bright on T2, low on T1 does not show any enhancement as seen on coronal postcontrast series 21, image 44 measuring 8 mm. No specific imaging follow-up. This has been present since at least CT scan of 09/05/2017 as well. Stomach/Bowel: Visualized bowel is nondilated. This includes visualized portions of the stomach, small  bowel and colon. Vascular/Lymphatic: Normal caliber aorta and IVC. Mild atherosclerotic changes along the aorta. Patent portal vein. Few prominent upper abdominal nodes are identified. Periceliac node for example on series 19, image 42 measures 2.0 x 1.0 cm. Going back to a study of December 2022 this node would have measured 2.0 by 1.1 cm, not significantly changed. Other prominent nodes in the porta hepatis are also unchanged. Other:  No frank ascites.  No areas of restricted diffusion. Musculoskeletal: Surgical changes along the lower lumbar spine at the edge of the imaging field. IMPRESSION: Once again mild inflammatory changes of the pancreatic head and neck and surrounding tissues consistent with pancreatitis as provided in the history. No complicating features such as fluid collection, necrosis. Surgical changes of distal pancreatectomy and splenectomy. Diffuse fatty liver infiltration with areas of sparing along the gallbladder fossa margin. Electronically Signed   By: Jill Side M.D.   On: 03/18/2022 14:48   MR 3D Recon At Scanner  Result Date: 03/18/2022 CLINICAL DATA:  Pancreatitis. History of distal pancreatectomy and splenectomy EXAM: MRI ABDOMEN WITHOUT AND WITH CONTRAST (INCLUDING MRCP) TECHNIQUE: Multiplanar multisequence MR imaging of the abdomen was performed both before and after the administration of intravenous contrast. Heavily T2-weighted images of the biliary and pancreatic ducts were obtained, and three-dimensional MRCP images were rendered by post processing. CONTRAST:  93m GADAVIST GADOBUTROL 1 MMOL/ML IV SOLN COMPARISON:  CT 03/17/2022. Ultrasound 03/18/2022. MRI 02/07/2021. Multiple older exams as well. FINDINGS: Lower chest: No pleural effusion at the lung bases. Scattered breathing motion throughout the examination. Hepatobiliary: There is significant signal dropout on out of phase imaging in the liver consistent with diffuse fatty liver infiltration. Once again there are areas  of sparing along the gallbladder fossa margin. No enhancing liver lesion. Patent portal vein. No intrahepatic biliary duct dilatation. The common duct measures 4 mm. Gallbladder is nondilated. No obvious filling defect. Pancreas: Again changes from distal pancreatectomy. There is some mild stranding and edema seen along the pancreatic head extending towards the second portion of the duodenal. Please correlate with clinical evidence of pancreatitis. Mild edema of the pancreas on T2 as well towards the head/neck region. Component of interstitial pancreatitis. Otherwise preserved enhancement and T1 signal. No well-defined fluid collections. Spleen:  Surgically absent. Adrenals/Urinary Tract: Adrenal glands are preserved. Kidneys are unremarkable. No enhancing mass or collecting system dilatation. Tiny Bosniak 1 cyst along the upper pole of the right kidney which is bright on T2, low on T1 does not show any enhancement as seen on coronal postcontrast series 21, image 44 measuring 8 mm. No specific imaging follow-up. This has been present since at least CT scan of 09/05/2017 as well. Stomach/Bowel: Visualized bowel is nondilated. This includes visualized portions of the stomach, small bowel and colon. Vascular/Lymphatic: Normal caliber aorta and  IVC. Mild atherosclerotic changes along the aorta. Patent portal vein. Few prominent upper abdominal nodes are identified. Periceliac node for example on series 19, image 42 measures 2.0 x 1.0 cm. Going back to a study of December 2022 this node would have measured 2.0 by 1.1 cm, not significantly changed. Other prominent nodes in the porta hepatis are also unchanged. Other:  No frank ascites.  No areas of restricted diffusion. Musculoskeletal: Surgical changes along the lower lumbar spine at the edge of the imaging field. IMPRESSION: Once again mild inflammatory changes of the pancreatic head and neck and surrounding tissues consistent with pancreatitis as provided in the  history. No complicating features such as fluid collection, necrosis. Surgical changes of distal pancreatectomy and splenectomy. Diffuse fatty liver infiltration with areas of sparing along the gallbladder fossa margin. Electronically Signed   By: Jill Side M.D.   On: 03/18/2022 14:48   US Abdomen Complete  Result Date: 03/18/2022 CLINICAL DATA:  Abdominal pain history of splenectomy. CT 03/17/2022 EXAM: ABDOMEN ULTRASOUND COMPLETE COMPARISON:  MRI February 07, 2021 and CT March 17, 2020. FINDINGS: Gallbladder: No gallstones or wall thickening visualized. No sonographic Murphy sign noted by sonographer. Common bile duct: Diameter: 9 mm Liver: No focal lesion identified. Diffusely increased parenchymal echogenicity. Portal vein is patent on color Doppler imaging with normal direction of blood flow towards the liver. IVC: No abnormality visualized. Pancreas: Visualized portion unremarkable. Spleen: Surgically absent. Right Kidney: Length: 10.6. Echogenicity within normal limits. No mass or hydronephrosis visualized. Left Kidney: Length: 10.7. Echogenicity within normal limits. No mass or hydronephrosis visualized. Abdominal aorta: No aneurysm visualized. Other findings: None. IMPRESSION: 1. Dilated common bile duct measuring 9 mm suggest correlation with laboratory values to exclude biliary obstruction if clinical concern for obstruction further evaluation with MRCP is recommended. 2. The liver is echogenic. This is a nonspecific finding but is most commonly seen with fatty infiltration of the liver. There are no obvious focal liver lesions. 3. Status post splenectomy. Electronically Signed   By: Dahlia Bailiff M.D.   On: 03/18/2022 12:05   CT ABDOMEN PELVIS W CONTRAST  Result Date: 03/17/2022 CLINICAL DATA:  Left upper quadrant abdominal pain. EXAM: CT ABDOMEN AND PELVIS WITH CONTRAST TECHNIQUE: Multidetector CT imaging of the abdomen and pelvis was performed using the standard protocol following bolus  administration of intravenous contrast. RADIATION DOSE REDUCTION: This exam was performed according to the departmental dose-optimization program which includes automated exposure control, adjustment of the mA and/or kV according to patient size and/or use of iterative reconstruction technique. CONTRAST:  121m OMNIPAQUE IOHEXOL 300 MG/ML  SOLN COMPARISON:  February 04, 2021 FINDINGS: Lower chest: No acute abnormality. Hepatobiliary: There is diffuse fatty infiltration of the liver with areas of focal fatty sparing seen adjacent to the gallbladder fossa. No gallstones, gallbladder wall thickening, or biliary dilatation. Pancreas: Postsurgical changes are seen consistent with the patient's history of distal pancreatectomy. Mild peripancreatic inflammatory fat stranding is seen within the head of the pancreas. No pancreatic ductal dilatation is seen. Spleen: Normal in size without focal abnormality. Adrenals/Urinary Tract: Adrenal glands are unremarkable. Kidneys are normal, without renal calculi, focal lesion, or hydronephrosis. Bladder is unremarkable. Stomach/Bowel: Stomach is within normal limits. Appendix appears normal. No evidence of bowel wall thickening, distention, or inflammatory changes. Vascular/Lymphatic: Aortic atherosclerosis. No enlarged abdominal or pelvic lymph nodes. Reproductive: Prostate is unremarkable. Other: No abdominal wall hernia or abnormality. No abdominopelvic ascites. Musculoskeletal: Postoperative changes are seen within the lower lumbar spine. IMPRESSION: 1. Mild acute  pancreatitis. Correlation with pancreatic enzymes is recommended. 2. Hepatic steatosis. 3. Postsurgical changes consistent with the patient's history of distal pancreatectomy. 4. Aortic atherosclerosis. Aortic Atherosclerosis (ICD10-I70.0). Electronically Signed   By: Virgina Norfolk M.D.   On: 03/17/2022 23:41    Microbiology: No results found for this or any previous visit (from the past 240 hour(s)).    Labs: Basic Metabolic Panel: Recent Labs  Lab 03/17/22 2143 03/18/22 1026 03/20/22 1051 03/22/22 0422  NA 134* 137 135 137  K 3.7 4.0 3.6 4.0  CL 101 102 98 98  CO2 '23 24 26 28  '$ GLUCOSE 154* 121* 89 101*  BUN '9 9 10 13  '$ CREATININE 0.96 0.93 0.95 1.11  CALCIUM 8.4* 8.5* 9.0 8.9   Liver Function Tests: Recent Labs  Lab 03/17/22 2143 03/18/22 1026 03/20/22 1051  AST '29 25 24  '$ ALT 50* 48* 43  ALKPHOS 75 68 81  BILITOT 0.2* 0.6 0.7  PROT 7.7 7.3 8.0  ALBUMIN 3.9 3.7 4.1   Recent Labs  Lab 03/17/22 2143  LIPASE 62*   No results for input(s): "AMMONIA" in the last 168 hours. CBC: Recent Labs  Lab 03/17/22 2143 03/18/22 1026 03/20/22 1051 03/21/22 0420 03/22/22 0422  WBC 15.0* 12.5* 8.9 9.7 7.8  NEUTROABS  --  7.7  --   --   --   HGB 13.5 13.0 13.8 13.9 13.6  HCT 39.6 39.6 40.9 40.8 40.0  MCV 93.6 98.0 94.2 94.9 95.0  PLT 410* 493* 515* 504* 498*   Cardiac Enzymes: No results for input(s): "CKTOTAL", "CKMB", "CKMBINDEX", "TROPONINI" in the last 168 hours. BNP: BNP (last 3 results) No results for input(s): "BNP" in the last 8760 hours.  ProBNP (last 3 results) No results for input(s): "PROBNP" in the last 8760 hours.  CBG: No results for input(s): "GLUCAP" in the last 168 hours.  Time spent: > 30 minutes were spent in preparing this discharge including medication reconciliation, counseling, and coordination of care.  Signed:  Jennae Hakeem Marry Guan, MD  [facility] 03/22/2022, 3:36 PM

## 2022-04-04 ENCOUNTER — Other Ambulatory Visit: Payer: Self-pay | Admitting: General Surgery

## 2022-04-04 DIAGNOSIS — K8500 Idiopathic acute pancreatitis without necrosis or infection: Secondary | ICD-10-CM

## 2022-05-06 ENCOUNTER — Other Ambulatory Visit: Payer: Medicare HMO

## 2022-05-23 DIAGNOSIS — S93601A Unspecified sprain of right foot, initial encounter: Secondary | ICD-10-CM | POA: Diagnosis not present

## 2022-05-23 DIAGNOSIS — W19XXXA Unspecified fall, initial encounter: Secondary | ICD-10-CM | POA: Diagnosis not present

## 2022-05-29 ENCOUNTER — Ambulatory Visit
Admission: RE | Admit: 2022-05-29 | Discharge: 2022-05-29 | Disposition: A | Discharge status: Home / Self Care | Payer: Medicare HMO | Source: Ambulatory Visit | Attending: General Surgery | Admitting: General Surgery

## 2022-05-29 DIAGNOSIS — K760 Fatty (change of) liver, not elsewhere classified: Secondary | ICD-10-CM | POA: Diagnosis not present

## 2022-05-29 DIAGNOSIS — I700 Atherosclerosis of aorta: Secondary | ICD-10-CM | POA: Diagnosis not present

## 2022-05-29 DIAGNOSIS — Z9889 Other specified postprocedural states: Secondary | ICD-10-CM | POA: Diagnosis not present

## 2022-05-29 DIAGNOSIS — K859 Acute pancreatitis without necrosis or infection, unspecified: Secondary | ICD-10-CM | POA: Diagnosis not present

## 2022-05-29 DIAGNOSIS — K8500 Idiopathic acute pancreatitis without necrosis or infection: Secondary | ICD-10-CM

## 2022-05-29 MED ORDER — IOPAMIDOL (ISOVUE-300) INJECTION 61%
100.0000 mL | Freq: Once | INTRAVENOUS | Status: AC | PRN
  Administered 2022-05-29: 100 mL via INTRAVENOUS

## 2022-06-21 ENCOUNTER — Telehealth: Payer: Self-pay

## 2022-06-21 ENCOUNTER — Other Ambulatory Visit: Payer: Self-pay

## 2022-06-21 DIAGNOSIS — Z8589 Personal history of malignant neoplasm of other organs and systems: Secondary | ICD-10-CM | POA: Diagnosis not present

## 2022-06-21 DIAGNOSIS — K869 Disease of pancreas, unspecified: Secondary | ICD-10-CM

## 2022-06-21 DIAGNOSIS — K859 Acute pancreatitis without necrosis or infection, unspecified: Secondary | ICD-10-CM

## 2022-06-21 DIAGNOSIS — D3A8 Other benign neuroendocrine tumors: Secondary | ICD-10-CM

## 2022-06-21 DIAGNOSIS — K8500 Idiopathic acute pancreatitis without necrosis or infection: Secondary | ICD-10-CM | POA: Diagnosis not present

## 2022-06-21 NOTE — Telephone Encounter (Signed)
EUS scheduled for 08/15/22 at 1145 am at [facility] with GM   Left message on machine to call back

## 2022-06-21 NOTE — Telephone Encounter (Signed)
-----   Message from Lemar Lofty., MD sent at 06/21/2022 10:44 AM EDT ----- Regarding: RE: FB, No problem.  Anjolina Byrer, Please schedule EUS as able.  Thanks. GM ----- Message ----- From: Almond Lint, MD Sent: 06/21/2022  10:14 AM EDT To: Jearld Lesch; Lemar Lofty., MD  This guy is ready to proceed with repeat EUS.  He had recent CT 4/4 that was OK. I think he's at the point that if that is negative, he will get gallbladder out.  Interestingly, he never got pancreatitis prior to the EUS biopsy for his prior neuroendocrine tumor.    Tx FB    ----- Message ----- From: Lemar Lofty., MD Sent: 03/29/2022   5:39 AM EDT To: Almond Lint, MD  FB, We can certainly perform an EUS to rule out microlithiasis and rule out chronic pancreatitis changes.  I think waiting at least 4 weeks makes sense to let things settle out.  Also would probably just get a CT scan to be done just before my EUS so that we can make sure that there is not been any new development of any significant peripancreatic pseudocysts.  If you are amenable to this and think you will be as well let me know.  I would then forward to Tarah Buboltz so she can work on scheduling. Thanks. 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  He recently got admitted with pancreatitis and has had multiple other episodes of pancreatitis.  MRCP and u/s showed no gallstones and this has also been true in the past.    Would you consider EUS for this?    fb

## 2022-06-25 NOTE — Telephone Encounter (Signed)
Left message on machine to call back  

## 2022-06-25 NOTE — Telephone Encounter (Signed)
Thanks Patty for the update. GM 

## 2022-06-25 NOTE — Telephone Encounter (Signed)
I have him scheduled for 6/20 but have been unable to reach him by phone. I have mailed all the information. I will attempt to reach him again. I have entered the lab order

## 2022-06-25 NOTE — Telephone Encounter (Signed)
-----   Message from Lemar Lofty., MD sent at 06/25/2022 12:36 PM EDT ----- FB, We can work on getting the patient scheduled for EUS. I will have my nurse also have the patient come in for labs at his convenience to do an IgG4 level just to ensure that that remains normal. Will rule out choledocholithiasis, cholelithiasis, cholelithiasis, chronic pancreatitis.  Jenkins Risdon, This is a patient of Dr. Christella Hartigan from years ago but has had recurrent pancreatitis.  Lets plan on getting the patient scheduled for EUS and also have him come in for IgG4 level. Thanks. GM ----- Message ----- From: Almond Lint, MD Sent: 06/21/2022  10:14 AM EDT To: Jearld Lesch; Lemar Lofty., MD  This guy is ready to proceed with repeat EUS.  He had recent CT 4/4 that was OK. I think he's at the point that if that is negative, he will get gallbladder out.  Interestingly, he never got pancreatitis prior to the EUS biopsy for his prior neuroendocrine tumor.    Tx FB    ----- Message ----- From: Lemar Lofty., MD Sent: 03/29/2022   5:39 AM EDT To: Almond Lint, MD  FB, We can certainly perform an EUS to rule out microlithiasis and rule out chronic pancreatitis changes.  I think waiting at least 4 weeks makes sense to let things settle out.  Also would probably just get a CT scan to be done just before my EUS so that we can make sure that there is not been any new development of any significant peripancreatic pseudocysts.  If you are amenable to this and think you will be as well let me know.  I would then forward to Chrishun Scheer so she can work on scheduling. Thanks. 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  He recently got admitted with pancreatitis and has had multiple other episodes of pancreatitis.  MRCP and u/s showed no gallstones and this has also been true  in the past.    Would you consider EUS for this?    fb

## 2022-06-26 NOTE — Telephone Encounter (Signed)
I have attempted to reach the pt on several occasions by phone without a response. I have also mailed all information to the pt. I will send to My Chart as well.

## 2022-08-07 ENCOUNTER — Encounter (HOSPITAL_COMMUNITY): Payer: Self-pay | Admitting: Gastroenterology

## 2022-08-14 NOTE — Anesthesia Preprocedure Evaluation (Addendum)
Anesthesia Evaluation  Patient identified by MRN, date of birth, ID band Patient awake    Reviewed: Allergy & Precautions, NPO status , Patient's Chart, lab work & pertinent test results  Airway Mallampati: II  TM Distance: >3 FB Neck ROM: Full    Dental  (+) Dental Advisory Given, Teeth Intact   Pulmonary former smoker   Pulmonary exam normal breath sounds clear to auscultation       Cardiovascular negative cardio ROS Normal cardiovascular exam Rhythm:Regular Rate:Normal     Neuro/Psych  PSYCHIATRIC DISORDERS  Depression       GI/Hepatic Neg liver ROS,GERD  Medicated,,  Endo/Other  negative endocrine ROS    Renal/GU negative Renal ROS     Musculoskeletal  (+) Arthritis ,    Abdominal  (+) + obese  Peds  Hematology negative hematology ROS (+)   Anesthesia Other Findings   Reproductive/Obstetrics                             Anesthesia Physical Anesthesia Plan  ASA: 2  Anesthesia Plan: MAC   Post-op Pain Management: Minimal or no pain anticipated   Induction: Intravenous  PONV Risk Score and Plan: 2 and Ondansetron, Propofol infusion and TIVA  Airway Management Planned:   Additional Equipment: None  Intra-op Plan:   Post-operative Plan:   Informed Consent: I have reviewed the patients History and Physical, chart, labs and discussed the procedure including the risks, benefits and alternatives for the proposed anesthesia with the patient or authorized representative who has indicated his/her understanding and acceptance.     Dental advisory given  Plan Discussed with: CRNA  Anesthesia Plan Comments: ( )        Anesthesia Quick Evaluation

## 2022-08-15 ENCOUNTER — Ambulatory Visit (HOSPITAL_COMMUNITY): Payer: Medicare HMO | Admitting: Anesthesiology

## 2022-08-15 ENCOUNTER — Encounter (HOSPITAL_COMMUNITY)
Admission: RE | Disposition: A | Discharge status: Home / Self Care | Payer: Self-pay | Source: Home / Self Care | Attending: Gastroenterology

## 2022-08-15 ENCOUNTER — Ambulatory Visit (HOSPITAL_COMMUNITY)
Admission: RE | Admit: 2022-08-15 | Discharge: 2022-08-15 | Disposition: A | Discharge status: Home / Self Care | Payer: Medicare HMO | Attending: Gastroenterology | Admitting: Gastroenterology

## 2022-08-15 ENCOUNTER — Other Ambulatory Visit: Payer: Self-pay

## 2022-08-15 ENCOUNTER — Encounter (HOSPITAL_COMMUNITY): Payer: Self-pay | Admitting: Gastroenterology

## 2022-08-15 ENCOUNTER — Telehealth: Payer: Self-pay

## 2022-08-15 ENCOUNTER — Ambulatory Visit (HOSPITAL_BASED_OUTPATIENT_CLINIC_OR_DEPARTMENT_OTHER): Payer: Medicare HMO | Admitting: Anesthesiology

## 2022-08-15 DIAGNOSIS — K859 Acute pancreatitis without necrosis or infection, unspecified: Secondary | ICD-10-CM

## 2022-08-15 DIAGNOSIS — K3189 Other diseases of stomach and duodenum: Secondary | ICD-10-CM | POA: Diagnosis not present

## 2022-08-15 DIAGNOSIS — K861 Other chronic pancreatitis: Secondary | ICD-10-CM | POA: Insufficient documentation

## 2022-08-15 DIAGNOSIS — Z90411 Acquired partial absence of pancreas: Secondary | ICD-10-CM | POA: Insufficient documentation

## 2022-08-15 DIAGNOSIS — Z87891 Personal history of nicotine dependence: Secondary | ICD-10-CM | POA: Insufficient documentation

## 2022-08-15 DIAGNOSIS — K449 Diaphragmatic hernia without obstruction or gangrene: Secondary | ICD-10-CM | POA: Diagnosis not present

## 2022-08-15 DIAGNOSIS — Z800 Family history of malignant neoplasm of digestive organs: Secondary | ICD-10-CM | POA: Diagnosis not present

## 2022-08-15 DIAGNOSIS — F32A Depression, unspecified: Secondary | ICD-10-CM | POA: Insufficient documentation

## 2022-08-15 DIAGNOSIS — Z6830 Body mass index (BMI) 30.0-30.9, adult: Secondary | ICD-10-CM

## 2022-08-15 DIAGNOSIS — K2289 Other specified disease of esophagus: Secondary | ICD-10-CM | POA: Diagnosis not present

## 2022-08-15 DIAGNOSIS — K311 Adult hypertrophic pyloric stenosis: Secondary | ICD-10-CM

## 2022-08-15 DIAGNOSIS — K2950 Unspecified chronic gastritis without bleeding: Secondary | ICD-10-CM | POA: Diagnosis not present

## 2022-08-15 DIAGNOSIS — D3A8 Other benign neuroendocrine tumors: Secondary | ICD-10-CM

## 2022-08-15 DIAGNOSIS — E669 Obesity, unspecified: Secondary | ICD-10-CM | POA: Insufficient documentation

## 2022-08-15 DIAGNOSIS — M1990 Unspecified osteoarthritis, unspecified site: Secondary | ICD-10-CM | POA: Insufficient documentation

## 2022-08-15 DIAGNOSIS — K219 Gastro-esophageal reflux disease without esophagitis: Secondary | ICD-10-CM | POA: Insufficient documentation

## 2022-08-15 DIAGNOSIS — G8929 Other chronic pain: Secondary | ICD-10-CM | POA: Diagnosis not present

## 2022-08-15 DIAGNOSIS — K319 Disease of stomach and duodenum, unspecified: Secondary | ICD-10-CM | POA: Diagnosis not present

## 2022-08-15 DIAGNOSIS — Z09 Encounter for follow-up examination after completed treatment for conditions other than malignant neoplasm: Secondary | ICD-10-CM | POA: Insufficient documentation

## 2022-08-15 DIAGNOSIS — M549 Dorsalgia, unspecified: Secondary | ICD-10-CM | POA: Diagnosis not present

## 2022-08-15 DIAGNOSIS — K2970 Gastritis, unspecified, without bleeding: Secondary | ICD-10-CM | POA: Diagnosis not present

## 2022-08-15 DIAGNOSIS — K8689 Other specified diseases of pancreas: Secondary | ICD-10-CM | POA: Diagnosis not present

## 2022-08-15 HISTORY — PX: BIOPSY: SHX5522

## 2022-08-15 HISTORY — PX: EUS: SHX5427

## 2022-08-15 HISTORY — PX: ESOPHAGOGASTRODUODENOSCOPY (EGD) WITH PROPOFOL: SHX5813

## 2022-08-15 SURGERY — UPPER ENDOSCOPIC ULTRASOUND (EUS) RADIAL
Anesthesia: Monitor Anesthesia Care

## 2022-08-15 MED ORDER — LACTATED RINGERS IV SOLN: INTRAVENOUS | Status: DC

## 2022-08-15 MED ORDER — DEXAMETHASONE SODIUM PHOSPHATE 10 MG/ML IJ SOLN
INTRAMUSCULAR | Status: DC | PRN
  Administered 2022-08-15: 10 mg via INTRAVENOUS

## 2022-08-15 MED ORDER — MIDAZOLAM HCL 5 MG/5ML IJ SOLN
INTRAMUSCULAR | Status: DC | PRN
  Administered 2022-08-15: 2 mg via INTRAVENOUS

## 2022-08-15 MED ORDER — ONDANSETRON HCL 4 MG/2ML IJ SOLN
INTRAMUSCULAR | Status: DC | PRN
  Administered 2022-08-15: 4 mg via INTRAVENOUS

## 2022-08-15 MED ORDER — SODIUM CHLORIDE 0.9 % IV SOLN: INTRAVENOUS | Status: DC

## 2022-08-15 MED ORDER — LIDOCAINE HCL (PF) 2 % IJ SOLN
INTRAMUSCULAR | Status: DC | PRN
  Administered 2022-08-15: 100 mg via INTRADERMAL

## 2022-08-15 MED ORDER — FENTANYL CITRATE (PF) 100 MCG/2ML IJ SOLN
INTRAMUSCULAR | Status: AC
  Filled 2022-08-15: qty 2

## 2022-08-15 MED ORDER — PROPOFOL 500 MG/50ML IV EMUL
INTRAVENOUS | Status: DC | PRN
  Administered 2022-08-15: 130 ug/kg/min via INTRAVENOUS

## 2022-08-15 MED ORDER — PROPOFOL 1000 MG/100ML IV EMUL
INTRAVENOUS | Status: AC
  Filled 2022-08-15: qty 100

## 2022-08-15 MED ORDER — FENTANYL CITRATE (PF) 100 MCG/2ML IJ SOLN
INTRAMUSCULAR | Status: DC | PRN
  Administered 2022-08-15: 50 ug via INTRAVENOUS
  Administered 2022-08-15: 25 ug via INTRAVENOUS

## 2022-08-15 MED ORDER — MIDAZOLAM HCL 2 MG/2ML IJ SOLN
INTRAMUSCULAR | Status: AC
  Filled 2022-08-15: qty 2

## 2022-08-15 NOTE — Telephone Encounter (Signed)
-----   Message from Lemar Lofty., MD sent at 08/15/2022  1:28 PM EDT ----- Regarding: Followup Andre Simmons, Please have patient come in for a fecal elastase evaluation to rule out EPI. Patient aware after my EUS today to come in the next couple weeks to have that stool kit picked up. Thanks. GM

## 2022-08-15 NOTE — Discharge Instructions (Signed)
YOU HAD AN ENDOSCOPIC PROCEDURE TODAY: Refer to the procedure report and other information in the discharge instructions given to you for any specific questions about what was found during the examination. If this information does not answer your questions, please call [place] office at [phone] to clarify.  ° °YOU SHOULD EXPECT: Some feelings of bloating in the abdomen. Passage of more gas than usual. Walking can help get rid of the air that was put into your GI tract during the procedure and reduce the bloating. If you had a lower endoscopy (such as a colonoscopy or flexible sigmoidoscopy) you may notice spotting of blood in your stool or on the toilet paper. Some abdominal soreness may be present for a day or two, also. ° °DIET: Your first meal following the procedure should be a light meal and then it is ok to progress to your normal diet. A half-sandwich or bowl of soup is an example of a good first meal. Heavy or fried foods are harder to digest and may make you feel nauseous or bloated. Drink plenty of fluids but you should avoid alcoholic beverages for 24 hours. If you had a esophageal dilation, please see attached instructions for diet.   ° °ACTIVITY: Your care partner should take you home directly after the procedure. You should plan to take it easy, moving slowly for the rest of the day. You can resume normal activity the day after the procedure however YOU SHOULD NOT DRIVE, use power tools, machinery or perform tasks that involve climbing or major physical exertion for 24 hours (because of the sedation medicines used during the test).  ° °SYMPTOMS TO REPORT IMMEDIATELY: °A gastroenterologist can be reached at any hour. Please call [phone]  for any of the following symptoms:  °Following lower endoscopy (colonoscopy, flexible sigmoidoscopy) °Excessive amounts of blood in the stool  °Significant tenderness, worsening of abdominal pains  °Swelling of the abdomen that is new, acute  °Fever of 100° or  higher  °Following upper endoscopy (EGD, EUS, ERCP, esophageal dilation) °Vomiting of blood or coffee ground material  °New, significant abdominal pain  °New, significant chest pain or pain under the shoulder blades  °Painful or persistently difficult swallowing  °New shortness of breath  °Black, tarry-looking or red, bloody stools ° °FOLLOW UP:  °If any biopsies were taken you will be contacted by phone or by letter within the next 1-3 weeks. Call [phone]  if you have not heard about the biopsies in 3 weeks.  °Please also call with any specific questions about appointments or follow up tests. ° °

## 2022-08-15 NOTE — H&P (Signed)
GASTROENTEROLOGY PROCEDURE H&P NOTE   Primary Care Physician: Andre Rakes, MD  HPI: Andre Simmons is a 47 y.o. male who presents for EGD/EUS to evaluate for recurrent pancreatitis s/p prior distal pancreatectomy for NET.  Past Medical History:  Diagnosis Date   Arthritis    Chronic back pain    Depression    Family history of breast cancer    Family history of pancreatic cancer    GERD (gastroesophageal reflux disease)    Knee pain    Pancreatic tumor    Past Surgical History:  Procedure Laterality Date   BACK SURGERY  03/2018   Dr Andre Simmons.  L4-L5, S1 micro discectomy   EUS N/A 09/25/2017   Procedure: UPPER ENDOSCOPIC ULTRASOUND (EUS) LINEAR;  Surgeon: Andre Fee, MD;  Location: [facility];  Service: Endoscopy;  Laterality: N/A;  Radial and Linear   FINE NEEDLE ASPIRATION  09/25/2017   Procedure: FINE NEEDLE ASPIRATION (FNA) RADIAL;  Surgeon: Andre Fee, MD;  Location: [facility];  Service: Endoscopy;;   KNEE SURGERY Left    x 20 surgeries   LAPAROSCOPIC DISTAL PANCREATECTOMY  11/26/2017   Diagnostic laparoscopy, laparoscopic hand-assisted distal    MOUTH SURGERY     as a child, unsure if wisdom teeth or tonsils   PANCREATECTOMY N/A 11/25/2017   Procedure: LAPAROSCOPIC DISTAL PANCREATECTOMY AND SPLENECTOMY ERAS PATHWAY;  Surgeon: Andre Lint, MD;  Location: [facility] OR;  Service: General;  Laterality: N/A;   Removal of Spleen  2019   No current facility-administered medications for this encounter.   No current facility-administered medications for this encounter. Allergies  Allergen Reactions   Adhesive [Tape] Rash and Other (See Comments)    Skin irritation.   Reglan [Metoclopramide] Anxiety   Family History  Problem Relation Age of Onset   Thyroid cancer Mother 105       papillary   Other Father        MVA at age 63   Lung cancer Maternal Aunt    Pancreatic cancer Maternal Uncle    Esophageal cancer Maternal Grandmother 80       smoker    Breast cancer Maternal Grandmother 76   Esophageal cancer Maternal Grandfather 62       smoker   Esophageal cancer Paternal Grandmother    Healthy Half-Sister    Colon cancer Neg Hx    Rectal cancer Neg Hx    Social History   Socioeconomic History   Marital status: Single    Spouse name: Not on file   Number of children: 2   Years of education: Not on file   Highest education level: Not on file  Occupational History   Occupation: Disabled  Tobacco Use   Smoking status: Former   Smokeless tobacco: Never   Tobacco comments:    Quit 14-15 years ago (as of 2019)  Vaping Use   Vaping Use: Never used  Substance and Sexual Activity   Alcohol use: No   Drug use: Yes    Types: Psilocybin   Sexual activity: Not on file  Other Topics Concern   Not on file  Social History Narrative   Not on file   Social Determinants of Health   Financial Resource Strain: Not on file  Food Insecurity: No Food Insecurity (03/18/2022)   Hunger Vital Sign    Worried About Running Out of Food in the Last Year: Never true    Ran Out of Food in the Last Year: Never true  Transportation Needs:  No Transportation Needs (03/18/2022)   PRAPARE - Administrator, Civil Service (Medical): No    Lack of Transportation (Non-Medical): No  Physical Activity: Not on file  Stress: Not on file  Social Connections: Not on file  Intimate Partner Violence: Not At Risk (03/18/2022)   Humiliation, Afraid, Rape, and Kick questionnaire    Fear of Current or Ex-Partner: No    Emotionally Abused: No    Physically Abused: No    Sexually Abused: No    Physical Exam: Today's Vitals   08/07/22 1302  Weight: 108.9 kg   Body mass index is 30.81 kg/m. GEN: NAD EYE: Sclerae anicteric ENT: MMM CV: Non-tachycardic GI: Soft, NT/ND NEURO:  Alert & Oriented x 3  Lab Results: No results for input(s): "WBC", "HGB", "HCT", "PLT" in the last 72 hours. BMET No results for input(s): "NA", "K", "CL", "CO2",  "GLUCOSE", "BUN", "CREATININE", "CALCIUM" in the last 72 hours. LFT No results for input(s): "PROT", "ALBUMIN", "AST", "ALT", "ALKPHOS", "BILITOT", "BILIDIR", "IBILI" in the last 72 hours. PT/INR No results for input(s): "LABPROT", "INR" in the last 72 hours.   Impression / Plan: This is a 47 y.o.male who presents for EGD/EUS to evaluate for recurrent pancreatitis s/p prior distal pancreatectomy for NET.  The risks of an EUS including intestinal perforation, bleeding, infection, aspiration, and medication effects were discussed as was the possibility it may not give a definitive diagnosis if a biopsy is performed.  When a biopsy of the pancreas is done as part of the EUS, there is an additional risk of pancreatitis at the rate of about 1-2%.  It was explained that procedure related pancreatitis is typically mild, although it can be severe and even life threatening, which is why we do not perform random pancreatic biopsies and only biopsy a lesion/area we feel is concerning enough to warrant the risk.  The risks and benefits of endoscopic evaluation/treatment were discussed with the patient and/or family; these include but are not limited to the risk of perforation, infection, bleeding, missed lesions, lack of diagnosis, severe illness requiring hospitalization, as well as anesthesia and sedation related illnesses.  The patient's history has been reviewed, patient examined, no change in status, and deemed stable for procedure.  The patient and/or family is agreeable to proceed.    Andre Parish, MD [facility] Office # [phone]

## 2022-08-15 NOTE — Anesthesia Postprocedure Evaluation (Signed)
Anesthesia Post Note  Patient: Andre Simmons  Procedure(s) Performed: UPPER ENDOSCOPIC ULTRASOUND (EUS) RADIAL BIOPSY ESOPHAGOGASTRODUODENOSCOPY (EGD) WITH PROPOFOL     Patient location during evaluation: PACU Anesthesia Type: MAC Level of consciousness: awake and alert Pain management: pain level controlled Vital Signs Assessment: post-procedure vital signs reviewed and stable Respiratory status: spontaneous breathing Cardiovascular status: stable Anesthetic complications: no   No notable events documented.  Last Vitals:  Vitals:   08/15/22 1321 08/15/22 1331  BP: 111/81 (!) 109/90  Pulse: 79 73  Resp: 15 13  Temp:    SpO2: 92% 93%    Last Pain:  Vitals:   08/15/22 1331  TempSrc:   PainSc: 0-No pain                 Lewie Loron

## 2022-08-15 NOTE — Telephone Encounter (Signed)
Stool test order placed as requested

## 2022-08-15 NOTE — Op Note (Signed)
[facility] Patient Name: Andre Simmons Procedure Date: 08/15/2022 MRN: [id number] Attending MD: Corliss Parish , MD, [id number] Date of Birth: 1976-01-27 CSN: [id number] Age: 47 Admit Type: Outpatient Procedure:                Upper EUS Indications:              Acute recurrent pancreatitis, history of PNET post                            distal pancreatectomy Providers:                Corliss Parish, MD, Fransisca Connors, Zoe Lan, RN, Salley Scarlet, Technician, Kandice Robinsons, Technician Referring MD:             Almond Lint MD, MD, Quentin Mulling Medicines:                Monitored Anesthesia Care Complications:            No immediate complications. Estimated Blood Loss:     Estimated blood loss was minimal. Procedure:                Pre-Anesthesia Assessment:                           - Prior to the procedure, a History and Physical                            was performed, and patient medications and                            allergies were reviewed. The patient's tolerance of                            previous anesthesia was also reviewed. The risks                            and benefits of the procedure and the sedation                            options and risks were discussed with the patient.                            All questions were answered, and informed consent                            was obtained. Prior Anticoagulants: The patient has                            taken no anticoagulant or antiplatelet agents. ASA  Grade Assessment: II - A patient with mild systemic                            disease. After reviewing the risks and benefits,                            the patient was deemed in satisfactory condition to                            undergo the procedure.                           After obtaining informed consent, the endoscope was                             passed under direct vision. Throughout the                            procedure, the patient's blood pressure, pulse, and                            oxygen saturations were monitored continuously. The                            [id number]-[id number] ([id number]) Olympus endoscope was introduced                            through the mouth, and advanced to the second part                            of duodenum. The [id number]-[id number] ([id number]) Olympus                            duodenoscope was introduced through the mouth, and                            advanced to the area of papilla. The [id number]                            ([id number]) Olympus linear ultrasound scope was                            introduced through the mouth, and advanced to the                            duodenum for ultrasound examination from the                            stomach and duodenum. The upper EUS was                            accomplished without difficulty. The patient  tolerated the procedure. Scope In: Scope Out: Findings:      ENDOSCOPIC FINDING: :      No gross lesions were noted in the entire esophagus.      The Z-line was irregular and was found 44 cm from the incisors.      A 1 cm hiatal hernia was present.      Patchy mildly erythematous mucosa without bleeding was found in the       entire examined stomach. Biopsies were taken with a cold forceps for       histology and Helicobacter pylori testing.      A mild stenosis was found at the pylorus. This was traversed with the       endoscope and the ERCP scope (after ERCP scope passage, mild mucosal       wrent noted) and the echoendoscope.      No gross lesions were noted in the duodenal bulb, in the first portion       of the duodenum and in the second portion of the duodenum.      The major papilla was normal.      ENDOSONOGRAPHIC FINDING: :      There was no sign of significant endosonographic abnormality in the        common bile duct (1.7 mm -> 3.0 mm) in the common hepatic duct. No       stones, no biliary sludge and ducts of normal caliber were identified.      A heterogenous and shadowing lesion was identified endosonographically       in the gallbladder. This has more of an appearance of a polyp rather       than a stone but it does shadow. It measured 6 mm x 4.4 mm.      Endosonographic imaging of the ampulla showed no intramural       (subepithelial) lesion.      Pancreatic parenchymal abnormalities were noted in the pancreatic head       (PD = 1.1 mm), genu of the pancreas (PD = 1.2 mm) and pancreatic body       (PD = 1.3 mm). These consisted of diffusely increased echogenicity,       almost a fatty appearance.      Endosonographic imaging in the visualized portion of the liver showed no       mass.      No malignant-appearing lymph nodes were visualized in the celiac region       (level 20), peripancreatic region and porta hepatis region.      The celiac region was visualized. Impression:               EGD impression:                           - No gross lesions in the entire esophagus. Z-line                            irregular, 44 cm from the incisors.                           - 1 cm hiatal hernia.                           - Erythematous mucosa in the stomach. Biopsied.                           -  Gastric stenosis was found at the pylorus                            (traversed with EGD/ERCP/echoendoscope) with mild                            mucosal wrent.                           - No gross lesions in the duodenal bulb, in the                            first portion of the duodenum and in the second                            portion of the duodenum.                           - Normal major papilla.                           EUS impression:                           - There was no sign of significant pathology in the                            common bile duct and in the common  hepatic duct.                           - A lesion more likely a gallbladder polyp was                            found in the gallbladder. It does shadow however,                            so could end up being a small stone (did not move                            upon patient maneuvering).                           - Pancreatic parenchymal abnormalities consisting                            of diffusely increased echogenicity were noted in                            the pancreatic head, genu of the pancreas and                            pancreatic body. The pancreatic duct was otherwise  normal throughout. More of an appearance of fatty                            pancreas.                           - No malignant-appearing lymph nodes were                            visualized in the celiac region (level 20),                            peripancreatic region and porta hepatis region. Moderate Sedation:      Not Applicable - Patient had care per Anesthesia. Recommendation:           - The patient will be observed post-procedure,                            until all discharge criteria are met.                           - Discharge patient to home.                           - Patient has a contact number available for                            emergencies. The signs and symptoms of potential                            delayed complications were discussed with the                            patient. Return to normal activities tomorrow.                            Written discharge instructions were provided to the                            patient.                           - Resume previous diet.                           - Observe patient's clinical course.                           - Await path results.                           - Followup IGg4 level. If this level is quite high,                            will need to consider the role of biopsy to rule  out AIP, but previously has been negative.                           - Observe patient's clinical course.                           - Follow-up with Dr. Donell Beers, to consider role of                            cholecystectomy in the setting of recurrent                            idiopathic pancreatitis with gallbladder lesion                            noted today. Some data set suggest that 25 to 30%                            of individuals with recurrent idiopathic                            pancreatitis have improvement after cholecystectomy.                           - The findings and recommendations were discussed                            with the patient.                           - The findings and recommendations were discussed                            with the patient.                           - The findings and recommendations were discussed                            with the designated responsible adult. Procedure Code(s):        --- Professional ---                           [phone], Esophagogastroduodenoscopy, flexible,                            transoral; with biopsy, single or multiple Diagnosis Code(s):        --- Professional ---                           K22.89, Other specified disease of esophagus                           K44.9, Diaphragmatic hernia without obstruction or  gangrene                           K31.89, Other diseases of stomach and duodenum                           K31.1, Adult hypertrophic pyloric stenosis                           K85.90, Acute pancreatitis without necrosis or                            infection, unspecified CPT copyright 2022 [employer]. All rights reserved. The codes documented in this report are preliminary and upon coder review may  be revised to meet current compliance requirements. Corliss Parish, MD 08/15/2022 1:19:02 PM Number of Addenda: 0

## 2022-08-15 NOTE — Transfer of Care (Signed)
Immediate Anesthesia Transfer of Care Note  Patient: Andre Simmons  Procedure(s) Performed: UPPER ENDOSCOPIC ULTRASOUND (EUS) RADIAL BIOPSY ESOPHAGOGASTRODUODENOSCOPY (EGD) WITH PROPOFOL  Patient Location: PACU and Endoscopy Unit  Anesthesia Type:MAC  Level of Consciousness: awake, alert , oriented, and patient cooperative  Airway & Oxygen Therapy: Patient Spontanous Breathing and Patient connected to face mask oxygen  Post-op Assessment: Report given to RN, Post -op Vital signs reviewed and stable, and Patient moving all extremities  Post vital signs: Reviewed and stable  Last Vitals:  Vitals Value Taken Time  BP 119/87 08/15/22 1313  Temp 36.6 C 08/15/22 1313  Pulse 81 08/15/22 1317  Resp 17 08/15/22 1317  SpO2 93 % 08/15/22 1317  Vitals shown include unvalidated device data.  Last Pain:  Vitals:   08/15/22 1313  TempSrc: Temporal  PainSc:          Complications: No notable events documented.

## 2022-08-18 LAB — SURGICAL PATHOLOGY

## 2022-08-19 ENCOUNTER — Encounter (HOSPITAL_COMMUNITY): Payer: Self-pay | Admitting: Gastroenterology

## 2022-08-28 ENCOUNTER — Telehealth: Payer: Self-pay

## 2022-08-28 DIAGNOSIS — D3A8 Other benign neuroendocrine tumors: Secondary | ICD-10-CM

## 2022-08-28 DIAGNOSIS — K869 Disease of pancreas, unspecified: Secondary | ICD-10-CM

## 2022-08-28 NOTE — Telephone Encounter (Signed)
-----   Message from Lemar Lofty., MD sent at 08/28/2022  1:59 AM EDT ----- FB, I can have my team reach out. He has 0% chance of pancreatitis from my EUS because we didn't biopsy it. The gallbladder lesion/polyp/stone is something that is present and I'm not convinced it is the reason for symptoms, but may be something if we document continued episodes of pancreatitis, we may need to consider CCK.  Meyer Arora, Please reach out to patient. See how he is doing. Please let him know that he could not have gotten pancreatitis from the EUS procedure itself because we biopsy it. That being said, would like him to come in for a CBC/CMP/lipase/amylase. I am willing to prescribe the patient tramadol 50 mg every 8 hour as needed (30/0) and we can continue this medication.  I do not prescribe long-term opioid therapy otherwise. Thanks. GM ----- Message ----- From: Almond Lint, MD Sent: 08/27/2022  10:11 AM EDT To: Jearld Lesch; Lemar Lofty., MD  This guy called our office as he thinks he has pancreatitis after the EUS.  This was when I was out of the office and our PA advised him to go to the hospital.  He called again today and requested pain meds.  I don't want to manage the pancreatitis at this juncture since I haven't intervened on him.  Do you think your staff could call and check on him?    Fb

## 2022-08-28 NOTE — Telephone Encounter (Signed)
The pt has been advised and will come in for labs as soon as able.  He does not describe any pain at this time. I did offer tramadol but pt declines at this time.  He states he will have labs and call back if he has a return of pain.

## 2022-09-01 ENCOUNTER — Encounter: Payer: Self-pay | Admitting: Gastroenterology

## 2022-09-23 ENCOUNTER — Other Ambulatory Visit: Payer: Self-pay | Admitting: General Surgery

## 2022-09-23 DIAGNOSIS — K859 Acute pancreatitis without necrosis or infection, unspecified: Secondary | ICD-10-CM

## 2022-09-23 DIAGNOSIS — K824 Cholesterolosis of gallbladder: Secondary | ICD-10-CM | POA: Diagnosis not present

## 2022-09-23 DIAGNOSIS — K8500 Idiopathic acute pancreatitis without necrosis or infection: Secondary | ICD-10-CM | POA: Diagnosis not present

## 2022-10-01 NOTE — Pre-Procedure Instructions (Signed)
Surgical Instructions   Your procedure is scheduled on Tuesday, August 13th. Report to [facility] Main Entrance "A" at 05:30 A.M., then check in with the Admitting office. Any questions or running late day of surgery: call [phone]  Questions prior to your surgery date: call [phone], Monday-Friday, 8am-4pm. If you experience any cold or flu symptoms such as cough, fever, chills, shortness of breath, etc. between now and your scheduled surgery, please notify us at the above number.     Remember:  Do not eat after midnight the night before your surgery   You may drink clear liquids until 04:30 AM the morning of your surgery.   Clear liquids allowed are: Water, Non-Citrus Juices (without pulp), Carbonated Beverages, Clear Tea, Black Coffee Only (NO MILK, CREAM OR POWDERED CREAMER of any kind), and Gatorade.  Patient Instructions  The night before surgery:  No food after midnight. ONLY clear liquids after midnight  The day of surgery (if you do NOT have diabetes):  Drink ONE (1) Pre-Surgery Clear Ensure by 04:30 AM the morning of surgery. Drink in one sitting. Do not sip.  This drink was given to you during your hospital  pre-op appointment visit.  Nothing else to drink after completing the  Pre-Surgery Clear Ensure.          If you have questions, please contact your surgeon's office.    Take these medicines the morning of surgery with A SIP OF WATER  omeprazole (PRILOSEC OTC)     One week prior to surgery, STOP taking any Aspirin (unless otherwise instructed by your surgeon) Aleve, Naproxen, Ibuprofen, Motrin, Advil, Goody's, BC's, all herbal medications, fish oil, and non-prescription vitamins.                     Do NOT Smoke (Tobacco/Vaping) for 24 hours prior to your procedure.  If you use a CPAP at night, you may bring your mask/headgear for your overnight stay.   You will be asked to remove any contacts, glasses, piercing's, hearing aid's, dentures/partials  prior to surgery. Please bring cases for these items if needed.    Patients discharged the day of surgery will not be allowed to drive home, and someone needs to stay with them for 24 hours.  SURGICAL WAITING ROOM VISITATION Patients may have no more than 2 support people in the waiting area - these visitors may rotate.   Pre-op nurse will coordinate an appropriate time for 1 ADULT support person, who may not rotate, to accompany patient in pre-op.  Children under the age of 62 must have an adult with them who is not the patient and must remain in the main waiting area with an adult.  If the patient needs to stay at the hospital during part of their recovery, the visitor guidelines for inpatient rooms apply.  Please refer to the [facility] website for the visitor guidelines for any additional information.   If you received a COVID test during your pre-op visit  it is requested that you wear a mask when out in public, stay away from anyone that may not be feeling well and notify your surgeon if you develop symptoms. If you have been in contact with anyone that has tested positive in the last 10 days please notify you surgeon.      Pre-operative CHG Bathing Instructions   You can play a key role in reducing the risk of infection after surgery. Your skin needs to be as free of germs as  possible. You can reduce the number of germs on your skin by washing with CHG (chlorhexidine gluconate) soap before surgery. CHG is an antiseptic soap that kills germs and continues to kill germs even after washing.   DO NOT use if you have an allergy to chlorhexidine/CHG or antibacterial soaps. If your skin becomes reddened or irritated, stop using the CHG and notify one of our RNs at [phone].              TAKE A SHOWER THE NIGHT BEFORE SURGERY AND THE DAY OF SURGERY    Please keep in mind the following:  DO NOT shave, including legs and underarms, 48 hours prior to surgery.   You may shave your face  before/day of surgery.  Place clean sheets on your bed the night before surgery Use a clean washcloth (not used since being washed) for each shower. DO NOT sleep with pet's night before surgery.  CHG Shower Instructions:  If you choose to wash your hair and private area, wash first with your normal shampoo/soap.  After you use shampoo/soap, rinse your hair and body thoroughly to remove shampoo/soap residue.  Turn the water OFF and apply half the bottle of CHG soap to a CLEAN washcloth.  Apply CHG soap ONLY FROM YOUR NECK DOWN TO YOUR TOES (washing for 3-5 minutes)  DO NOT use CHG soap on face, private areas, open wounds, or sores.  Pay special attention to the area where your surgery is being performed.  If you are having back surgery, having someone wash your back for you may be helpful. Wait 2 minutes after CHG soap is applied, then you may rinse off the CHG soap.  Pat dry with a clean towel  Put on clean pajamas    Additional instructions for the day of surgery: DO NOT APPLY any lotions, deodorants, cologne, or perfumes.   Do not wear jewelry or makeup Do not wear nail polish, gel polish, artificial nails, or any other type of covering on natural nails (fingers and toes) Do not bring valuables to the hospital. [facility] is not responsible for valuables/personal belongings. Put on clean/comfortable clothes.  Please brush your teeth.  Ask your nurse before applying any prescription medications to the skin.

## 2022-10-02 ENCOUNTER — Encounter (HOSPITAL_COMMUNITY): Payer: Self-pay

## 2022-10-02 ENCOUNTER — Encounter (HOSPITAL_COMMUNITY)
Admission: RE | Admit: 2022-10-02 | Discharge: 2022-10-02 | Disposition: A | Discharge status: Home / Self Care | Payer: Medicare HMO | Source: Ambulatory Visit | Attending: General Surgery | Admitting: General Surgery

## 2022-10-02 ENCOUNTER — Other Ambulatory Visit: Payer: Self-pay

## 2022-10-02 DIAGNOSIS — K861 Other chronic pancreatitis: Secondary | ICD-10-CM | POA: Diagnosis not present

## 2022-10-02 DIAGNOSIS — Z01812 Encounter for preprocedural laboratory examination: Secondary | ICD-10-CM | POA: Diagnosis not present

## 2022-10-02 DIAGNOSIS — K859 Acute pancreatitis without necrosis or infection, unspecified: Secondary | ICD-10-CM | POA: Diagnosis not present

## 2022-10-02 DIAGNOSIS — Z01818 Encounter for other preprocedural examination: Secondary | ICD-10-CM | POA: Diagnosis present

## 2022-10-02 LAB — CBC WITH DIFFERENTIAL/PLATELET
Abs Immature Granulocytes: 0.04 10*3/uL (ref 0.00–0.07)
Basophils Absolute: 0.1 10*3/uL (ref 0.0–0.1)
Basophils Relative: 1 %
Eosinophils Absolute: 0.3 10*3/uL (ref 0.0–0.5)
Eosinophils Relative: 2 %
HCT: 43.9 % (ref 39.0–52.0)
Hemoglobin: 14.6 g/dL (ref 13.0–17.0)
Immature Granulocytes: 0 %
Lymphocytes Relative: 34 %
Lymphs Abs: 3.9 10*3/uL (ref 0.7–4.0)
MCH: 31.1 pg (ref 26.0–34.0)
MCHC: 33.3 g/dL (ref 30.0–36.0)
MCV: 93.4 fL (ref 80.0–100.0)
Monocytes Absolute: 0.8 10*3/uL (ref 0.1–1.0)
Monocytes Relative: 8 %
Neutro Abs: 6.1 10*3/uL (ref 1.7–7.7)
Neutrophils Relative %: 55 %
Platelets: 485 10*3/uL — ABNORMAL HIGH (ref 150–400)
RBC: 4.7 MIL/uL (ref 4.22–5.81)
RDW: 13.6 % (ref 11.5–15.5)
WBC: 11.2 10*3/uL — ABNORMAL HIGH (ref 4.0–10.5)
nRBC: 0 % (ref 0.0–0.2)

## 2022-10-02 LAB — COMPREHENSIVE METABOLIC PANEL
ALT: 41 U/L (ref 0–44)
AST: 22 U/L (ref 15–41)
Albumin: 3.9 g/dL (ref 3.5–5.0)
Alkaline Phosphatase: 76 U/L (ref 38–126)
Anion gap: 9 (ref 5–15)
BUN: 12 mg/dL (ref 6–20)
CO2: 22 mmol/L (ref 22–32)
Calcium: 8.7 mg/dL — ABNORMAL LOW (ref 8.9–10.3)
Chloride: 105 mmol/L (ref 98–111)
Creatinine, Ser: 0.97 mg/dL (ref 0.61–1.24)
GFR, Estimated: >60 mL/min (ref >60)
Glucose, Bld: 135 mg/dL — ABNORMAL HIGH (ref 70–99)
Potassium: 3.9 mmol/L (ref 3.5–5.1)
Sodium: 136 mmol/L (ref 135–145)
Total Bilirubin: 0.6 mg/dL (ref 0.3–1.2)
Total Protein: 7.4 g/dL (ref 6.5–8.1)

## 2022-10-02 LAB — LIPASE, BLOOD: Lipase: 42 U/L (ref 11–51)

## 2022-10-02 NOTE — Progress Notes (Signed)
PCP - Charlott Rakes ( just moved to area no one local yet)  Cardiologist - Denies  PPM/ICD - Denies Device Orders - n/a Rep Notified - n/a  Chest x-ray - denies EKG - denies Stress Test - denies ECHO - denies Cardiac Cath - denies  Sleep Study - denies CPAP - n/a  DM denies  Blood Thinner Instructions:denies Aspirin Instructions:n/a  ERAS Protcol -yes PRE-SURGERY Ensure    COVID TEST- denies   Anesthesia review: No  Patient denies shortness of breath, fever, cough and chest pain at PAT appointment   All instructions explained to the patient, with a verbal understanding of the material. Patient agrees to go over the instructions while at home for a better understanding. Patient also instructed to self quarantine after being tested for COVID-19. The opportunity to ask questions was provided.

## 2022-10-03 NOTE — Progress Notes (Addendum)
Called patient to go over updated location of surgery. No answer, left HIPAA compliant voicemail with call back number.  Called again with no answer. Left voicemail with instructions that surgery has been moved to [facility]. Surgery is scheduled on 10/08/22 and patient is to arrive at [facility] at 0515. Instructed to follow any other instructions given at PST appointment. Provided address and call back number.

## 2022-10-07 ENCOUNTER — Encounter (HOSPITAL_COMMUNITY): Payer: Self-pay | Admitting: General Surgery

## 2022-10-07 NOTE — Anesthesia Preprocedure Evaluation (Signed)
Anesthesia Evaluation  Patient identified by MRN, date of birth, ID band Patient awake    Reviewed: Allergy & Precautions, NPO status , Patient's Chart, lab work & pertinent test results  Airway Mallampati: II  TM Distance: >3 FB Neck ROM: Full    Dental  (+) Teeth Intact, Dental Advisory Given   Pulmonary former smoker   breath sounds clear to auscultation       Cardiovascular negative cardio ROS  Rhythm:Regular Rate:Normal     Neuro/Psych  PSYCHIATRIC DISORDERS  Depression    negative neurological ROS     GI/Hepatic Neg liver ROS,GERD  Medicated,,  Endo/Other  negative endocrine ROS    Renal/GU negative Renal ROS     Musculoskeletal  (+) Arthritis ,    Abdominal   Peds  Hematology negative hematology ROS (+)   Anesthesia Other Findings   Reproductive/Obstetrics                             Anesthesia Physical Anesthesia Plan  ASA: 3  Anesthesia Plan: General   Post-op Pain Management: Tylenol PO (pre-op)* and Toradol IV (intra-op)*   Induction: Intravenous  PONV Risk Score and Plan: 3 and Ondansetron, Dexamethasone and Midazolam  Airway Management Planned: Oral ETT  Additional Equipment: None  Intra-op Plan:   Post-operative Plan: Extubation in OR  Informed Consent: I have reviewed the patients History and Physical, chart, labs and discussed the procedure including the risks, benefits and alternatives for the proposed anesthesia with the patient or authorized representative who has indicated his/her understanding and acceptance.     Dental advisory given  Plan Discussed with: CRNA  Anesthesia Plan Comments:        Anesthesia Quick Evaluation

## 2022-10-08 ENCOUNTER — Ambulatory Visit (HOSPITAL_COMMUNITY)
Admission: RE | Admit: 2022-10-08 | Discharge: 2022-10-08 | Disposition: A | Discharge status: Home / Self Care | Payer: Medicare HMO | Attending: General Surgery | Admitting: General Surgery

## 2022-10-08 ENCOUNTER — Other Ambulatory Visit: Payer: Self-pay

## 2022-10-08 ENCOUNTER — Ambulatory Visit (HOSPITAL_BASED_OUTPATIENT_CLINIC_OR_DEPARTMENT_OTHER): Payer: Medicare HMO | Admitting: Anesthesiology

## 2022-10-08 ENCOUNTER — Ambulatory Visit (HOSPITAL_COMMUNITY): Payer: Medicare HMO | Admitting: Anesthesiology

## 2022-10-08 ENCOUNTER — Ambulatory Visit (HOSPITAL_COMMUNITY): Payer: Medicare HMO

## 2022-10-08 ENCOUNTER — Encounter (HOSPITAL_COMMUNITY)
Admission: RE | Disposition: A | Discharge status: Home / Self Care | Payer: Self-pay | Source: Home / Self Care | Attending: General Surgery

## 2022-10-08 ENCOUNTER — Encounter (HOSPITAL_COMMUNITY): Payer: Self-pay | Admitting: General Surgery

## 2022-10-08 DIAGNOSIS — K859 Acute pancreatitis without necrosis or infection, unspecified: Secondary | ICD-10-CM | POA: Insufficient documentation

## 2022-10-08 DIAGNOSIS — Z800 Family history of malignant neoplasm of digestive organs: Secondary | ICD-10-CM | POA: Insufficient documentation

## 2022-10-08 DIAGNOSIS — F32A Depression, unspecified: Secondary | ICD-10-CM

## 2022-10-08 DIAGNOSIS — Z79899 Other long term (current) drug therapy: Secondary | ICD-10-CM | POA: Insufficient documentation

## 2022-10-08 DIAGNOSIS — K811 Chronic cholecystitis: Secondary | ICD-10-CM | POA: Insufficient documentation

## 2022-10-08 DIAGNOSIS — K219 Gastro-esophageal reflux disease without esophagitis: Secondary | ICD-10-CM | POA: Diagnosis not present

## 2022-10-08 DIAGNOSIS — I700 Atherosclerosis of aorta: Secondary | ICD-10-CM | POA: Diagnosis not present

## 2022-10-08 DIAGNOSIS — K861 Other chronic pancreatitis: Secondary | ICD-10-CM | POA: Diagnosis not present

## 2022-10-08 DIAGNOSIS — K851 Biliary acute pancreatitis without necrosis or infection: Secondary | ICD-10-CM | POA: Diagnosis not present

## 2022-10-08 DIAGNOSIS — Z87891 Personal history of nicotine dependence: Secondary | ICD-10-CM | POA: Insufficient documentation

## 2022-10-08 DIAGNOSIS — K760 Fatty (change of) liver, not elsewhere classified: Secondary | ICD-10-CM | POA: Insufficient documentation

## 2022-10-08 DIAGNOSIS — Z9889 Other specified postprocedural states: Secondary | ICD-10-CM | POA: Insufficient documentation

## 2022-10-08 DIAGNOSIS — K8010 Calculus of gallbladder with chronic cholecystitis without obstruction: Secondary | ICD-10-CM | POA: Diagnosis not present

## 2022-10-08 DIAGNOSIS — K8020 Calculus of gallbladder without cholecystitis without obstruction: Secondary | ICD-10-CM | POA: Diagnosis not present

## 2022-10-08 HISTORY — PX: CHOLECYSTECTOMY: SHX55

## 2022-10-08 SURGERY — LAPAROSCOPIC CHOLECYSTECTOMY
Anesthesia: General

## 2022-10-08 MED ORDER — 0.9 % SODIUM CHLORIDE (POUR BTL) OPTIME
TOPICAL | Status: DC | PRN
  Administered 2022-10-08: 1000 mL

## 2022-10-08 MED ORDER — KETAMINE HCL 10 MG/ML IJ SOLN
INTRAMUSCULAR | Status: AC
  Filled 2022-10-08: qty 1

## 2022-10-08 MED ORDER — BUPIVACAINE-EPINEPHRINE 0.25% -1:200000 IJ SOLN
INTRAMUSCULAR | Status: AC
  Filled 2022-10-08: qty 1

## 2022-10-08 MED ORDER — FENTANYL CITRATE PF 50 MCG/ML IJ SOSY
25.0000 ug | PREFILLED_SYRINGE | INTRAMUSCULAR | Status: DC | PRN
  Administered 2022-10-08: 50 ug via INTRAVENOUS
  Administered 2022-10-08 (×2): 25 ug via INTRAVENOUS

## 2022-10-08 MED ORDER — IOPAMIDOL (ISOVUE-300) INJECTION 61%
INTRAVENOUS | Status: DC | PRN
  Administered 2022-10-08: 5 mL

## 2022-10-08 MED ORDER — LIDOCAINE HCL 1 % IJ SOLN
INTRAMUSCULAR | Status: DC | PRN
Start: 2022-10-08 — End: 2022-10-08
  Administered 2022-10-08: 20 mL

## 2022-10-08 MED ORDER — SUGAMMADEX SODIUM 200 MG/2ML IV SOLN
INTRAVENOUS | Status: DC | PRN
  Administered 2022-10-08: 300 mg via INTRAVENOUS

## 2022-10-08 MED ORDER — ONDANSETRON HCL 4 MG/2ML IJ SOLN
INTRAMUSCULAR | Status: DC | PRN
  Administered 2022-10-08: 4 mg via INTRAVENOUS

## 2022-10-08 MED ORDER — METOPROLOL TARTRATE 5 MG/5ML IV SOLN
INTRAVENOUS | Status: AC
  Filled 2022-10-08: qty 5

## 2022-10-08 MED ORDER — AMISULPRIDE (ANTIEMETIC) 5 MG/2ML IV SOLN: 10.0000 mg | Freq: Once | INTRAVENOUS | Status: DC | PRN

## 2022-10-08 MED ORDER — ACETAMINOPHEN 10 MG/ML IV SOLN: 1000.0000 mg | Freq: Once | INTRAVENOUS | Status: DC | PRN

## 2022-10-08 MED ORDER — IPRATROPIUM-ALBUTEROL 0.5-2.5 (3) MG/3ML IN SOLN
RESPIRATORY_TRACT | Status: AC
  Filled 2022-10-08: qty 3

## 2022-10-08 MED ORDER — GLYCOPYRROLATE 0.2 MG/ML IJ SOLN
INTRAMUSCULAR | Status: AC
  Filled 2022-10-08: qty 1

## 2022-10-08 MED ORDER — ACETAMINOPHEN 160 MG/5ML PO SOLN: 325.0000 mg | ORAL | Status: DC | PRN

## 2022-10-08 MED ORDER — ONDANSETRON HCL 4 MG/2ML IJ SOLN
INTRAMUSCULAR | Status: AC
  Filled 2022-10-08: qty 2

## 2022-10-08 MED ORDER — DEXAMETHASONE SODIUM PHOSPHATE 10 MG/ML IJ SOLN
INTRAMUSCULAR | Status: AC
  Filled 2022-10-08: qty 1

## 2022-10-08 MED ORDER — METOPROLOL TARTRATE 5 MG/5ML IV SOLN
INTRAVENOUS | Status: DC | PRN
  Administered 2022-10-08 (×2): 1 mg via INTRAVENOUS

## 2022-10-08 MED ORDER — LIDOCAINE HCL (PF) 1 % IJ SOLN
INTRAMUSCULAR | Status: AC
  Filled 2022-10-08: qty 30

## 2022-10-08 MED ORDER — FENTANYL CITRATE (PF) 100 MCG/2ML IJ SOLN
INTRAMUSCULAR | Status: DC | PRN
  Administered 2022-10-08 (×2): 50 ug via INTRAVENOUS
  Administered 2022-10-08: 100 ug via INTRAVENOUS
  Administered 2022-10-08: 50 ug via INTRAVENOUS

## 2022-10-08 MED ORDER — CHLORHEXIDINE GLUCONATE CLOTH 2 % EX PADS: 6.0000 | MEDICATED_PAD | Freq: Once | CUTANEOUS | Status: DC

## 2022-10-08 MED ORDER — IPRATROPIUM-ALBUTEROL 0.5-2.5 (3) MG/3ML IN SOLN
3.0000 mL | RESPIRATORY_TRACT | Status: DC
  Administered 2022-10-08: 3 mL via RESPIRATORY_TRACT

## 2022-10-08 MED ORDER — ACETAMINOPHEN 325 MG PO TABS: 325.0000 mg | ORAL_TABLET | ORAL | Status: DC | PRN

## 2022-10-08 MED ORDER — FENTANYL CITRATE PF 50 MCG/ML IJ SOSY
PREFILLED_SYRINGE | INTRAMUSCULAR | Status: AC
  Filled 2022-10-08: qty 2

## 2022-10-08 MED ORDER — ACETAMINOPHEN 500 MG PO TABS
1000.0000 mg | ORAL_TABLET | ORAL | Status: AC
  Administered 2022-10-08: 1000 mg via ORAL
  Filled 2022-10-08: qty 2

## 2022-10-08 MED ORDER — OXYCODONE HCL 5 MG PO TABS
5.0000 mg | ORAL_TABLET | Freq: Once | ORAL | Status: AC | PRN
  Administered 2022-10-08: 5 mg via ORAL

## 2022-10-08 MED ORDER — MIDAZOLAM HCL 2 MG/2ML IJ SOLN
INTRAMUSCULAR | Status: AC
  Filled 2022-10-08: qty 2

## 2022-10-08 MED ORDER — KETAMINE HCL 10 MG/ML IJ SOLN
INTRAMUSCULAR | Status: DC | PRN
  Administered 2022-10-08: 10 mg via INTRAVENOUS
  Administered 2022-10-08: 20 mg via INTRAVENOUS

## 2022-10-08 MED ORDER — OXYCODONE HCL 5 MG/5ML PO SOLN: 5.0000 mg | Freq: Once | ORAL | Status: AC | PRN

## 2022-10-08 MED ORDER — ROCURONIUM BROMIDE 10 MG/ML (PF) SYRINGE
PREFILLED_SYRINGE | INTRAVENOUS | Status: AC
  Filled 2022-10-08: qty 10

## 2022-10-08 MED ORDER — CHLORHEXIDINE GLUCONATE 0.12 % MT SOLN
15.0000 mL | Freq: Once | OROMUCOSAL | Status: AC
  Administered 2022-10-08: 15 mL via OROMUCOSAL

## 2022-10-08 MED ORDER — BUPIVACAINE-EPINEPHRINE (PF) 0.25% -1:200000 IJ SOLN
INTRAMUSCULAR | Status: DC | PRN
  Administered 2022-10-08: 20 mL

## 2022-10-08 MED ORDER — PROMETHAZINE HCL 25 MG/ML IJ SOLN: 6.2500 mg | INTRAMUSCULAR | Status: DC | PRN

## 2022-10-08 MED ORDER — MIDAZOLAM HCL 5 MG/5ML IJ SOLN
INTRAMUSCULAR | Status: DC | PRN
  Administered 2022-10-08: 2 mg via INTRAVENOUS

## 2022-10-08 MED ORDER — DEXAMETHASONE SODIUM PHOSPHATE 4 MG/ML IJ SOLN
INTRAMUSCULAR | Status: DC | PRN
  Administered 2022-10-08: 4 mg via INTRAVENOUS

## 2022-10-08 MED ORDER — PROPOFOL 10 MG/ML IV BOLUS
INTRAVENOUS | Status: AC
  Filled 2022-10-08: qty 20

## 2022-10-08 MED ORDER — ROCURONIUM BROMIDE 100 MG/10ML IV SOLN
INTRAVENOUS | Status: DC | PRN
  Administered 2022-10-08: 60 mg via INTRAVENOUS

## 2022-10-08 MED ORDER — CEFAZOLIN SODIUM-DEXTROSE 2-4 GM/100ML-% IV SOLN
2.0000 g | INTRAVENOUS | Status: AC
  Administered 2022-10-08: 2 g via INTRAVENOUS
  Filled 2022-10-08: qty 100

## 2022-10-08 MED ORDER — LACTATED RINGERS IV SOLN: INTRAVENOUS | Status: DC

## 2022-10-08 MED ORDER — LACTATED RINGERS IR SOLN
Status: DC | PRN
  Administered 2022-10-08: 1000 mL

## 2022-10-08 MED ORDER — ORAL CARE MOUTH RINSE: 15.0000 mL | Freq: Once | OROMUCOSAL | Status: AC

## 2022-10-08 MED ORDER — PROPOFOL 10 MG/ML IV BOLUS
INTRAVENOUS | Status: DC | PRN
  Administered 2022-10-08: 150 mg via INTRAVENOUS
  Administered 2022-10-08: 50 mg via INTRAVENOUS

## 2022-10-08 MED ORDER — OXYCODONE HCL 5 MG PO TABS: 5.0000 mg | ORAL_TABLET | Freq: Four times a day (QID) | ORAL | 0 refills | Status: DC | PRN

## 2022-10-08 MED ORDER — OXYCODONE HCL 5 MG PO TABS
ORAL_TABLET | ORAL | Status: AC
  Filled 2022-10-08: qty 1

## 2022-10-08 MED ORDER — GLYCOPYRROLATE 0.2 MG/ML IJ SOLN
INTRAMUSCULAR | Status: DC | PRN
  Administered 2022-10-08: .2 mg via INTRAVENOUS

## 2022-10-08 MED ORDER — FENTANYL CITRATE (PF) 250 MCG/5ML IJ SOLN
INTRAMUSCULAR | Status: AC
  Filled 2022-10-08: qty 5

## 2022-10-08 SURGICAL SUPPLY — 44 items
ADH SKN CLS APL DERMABOND .7 (GAUZE/BANDAGES/DRESSINGS) ×1
APL PRP STRL LF DISP 70% ISPRP (MISCELLANEOUS) ×1
APPLIER CLIP ROT 10 11.4 M/L (STAPLE) ×1
APR CLP MED LRG 11.4X10 (STAPLE) ×1
BAG COUNTER SPONGE SURGICOUNT (BAG) IMPLANT
BAG SPNG CNTER NS LX DISP (BAG) ×1
CHLORAPREP W/TINT 26 (MISCELLANEOUS) ×1 IMPLANT
CLIP APPLIE ROT 10 11.4 M/L (STAPLE) ×1 IMPLANT
CLIP LIGATING HEMO LOK XL GOLD (MISCELLANEOUS) ×1 IMPLANT
CLIP LIGATING HEMO O LOK GREEN (MISCELLANEOUS) IMPLANT
COVER MAYO STAND STRL (DRAPES) IMPLANT
COVER MAYO STAND XLG (MISCELLANEOUS) IMPLANT
COVER SURGICAL LIGHT HANDLE (MISCELLANEOUS) ×1 IMPLANT
DERMABOND ADVANCED .7 DNX12 (GAUZE/BANDAGES/DRESSINGS) IMPLANT
DRAPE C-ARM 42X120 X-RAY (DRAPES) IMPLANT
ELECT REM PT RETURN 15FT ADLT (MISCELLANEOUS) ×1 IMPLANT
GLOVE BIO SURGEON STRL SZ 6 (GLOVE) ×1 IMPLANT
GLOVE INDICATOR 6.5 STRL GRN (GLOVE) ×1 IMPLANT
GOWN STRL REUS W/ TWL XL LVL3 (GOWN DISPOSABLE) ×1 IMPLANT
GOWN STRL REUS W/TWL XL LVL3 (GOWN DISPOSABLE) ×1
HEMOSTAT SNOW SURGICEL 2X4 (HEMOSTASIS) IMPLANT
IRRIG SUCT STRYKERFLOW 2 WTIP (MISCELLANEOUS) ×1
IRRIGATION SUCT STRKRFLW 2 WTP (MISCELLANEOUS) ×1 IMPLANT
KIT BASIN OR (CUSTOM PROCEDURE TRAY) ×1 IMPLANT
KIT TURNOVER KIT A (KITS) IMPLANT
L-HOOK LAP DISP 36CM (ELECTROSURGICAL) ×1
LHOOK LAP DISP 36CM (ELECTROSURGICAL) ×1 IMPLANT
PENCIL SMOKE EVACUATOR (MISCELLANEOUS) IMPLANT
PROTECTOR NERVE ULNAR (MISCELLANEOUS) IMPLANT
SCISSORS LAP 5X35 DISP (ENDOMECHANICALS) ×1 IMPLANT
SET CHOLANGIOGRAPH MIX (MISCELLANEOUS) IMPLANT
SET TUBE SMOKE EVAC HIGH FLOW (TUBING) ×1 IMPLANT
SLEEVE Z-THREAD 5X100MM (TROCAR) ×1 IMPLANT
SPIKE FLUID TRANSFER (MISCELLANEOUS) ×1 IMPLANT
SUT MNCRL AB 4-0 PS2 18 (SUTURE) ×1 IMPLANT
SYR 20ML LL LF (SYRINGE) IMPLANT
SYS BAG RETRIEVAL 10MM (BASKET)
SYSTEM BAG RETRIEVAL 10MM (BASKET) IMPLANT
TOWEL OR 17X26 10 PK STRL BLUE (TOWEL DISPOSABLE) ×1 IMPLANT
TOWEL OR NON WOVEN STRL DISP B (DISPOSABLE) ×1 IMPLANT
TRAY LAPAROSCOPIC (CUSTOM PROCEDURE TRAY) ×1 IMPLANT
TROCAR 11X100 Z THREAD (TROCAR) ×1 IMPLANT
TROCAR BALLN 12MMX100 BLUNT (TROCAR) ×1 IMPLANT
TROCAR Z-THREAD OPTICAL 5X100M (TROCAR) ×1 IMPLANT

## 2022-10-08 NOTE — Interval H&P Note (Signed)
History and Physical Interval Note:  10/08/2022 7:40 AM  Andre Simmons  has presented today for surgery, with the diagnosis of PANCREATIC, GALLBLADDER POLYP.  The various methods of treatment have been discussed with the patient and family. After consideration of risks, benefits and other options for treatment, the patient has consented to  Procedure(s) with comments: LAPAROSCOPIC CHOLECYSTECTOMY, possible ioc (N/A) - POSSIBLE IOC as a surgical intervention.  The patient's history has been reviewed, patient examined, no change in status, stable for surgery.  I have reviewed the patient's chart and labs.  Questions were answered to the patient's satisfaction.     Almond Lint

## 2022-10-08 NOTE — Anesthesia Procedure Notes (Signed)
Procedure Name: Intubation Date/Time: 10/08/2022 7:58 AM  Performed by: Ahmed Prima, CRNAPre-anesthesia Checklist: Patient identified, Emergency Drugs available, Suction available and Patient being monitored Patient Re-evaluated:Patient Re-evaluated prior to induction Oxygen Delivery Method: Circle system utilized Preoxygenation: Pre-oxygenation with 100% oxygen Induction Type: IV induction Laryngoscope Size: Mac, Glidescope and 3 Grade View: Grade I Tube type: Oral Tube size: 7.5 mm Number of attempts: 1 Airway Equipment and Method: Stylet and Oral airway Placement Confirmation: ETT inserted through vocal cords under direct vision, positive ETCO2 and breath sounds checked- equal and bilateral Secured at: 24 cm Tube secured with: Tape Dental Injury: Teeth and Oropharynx as per pre-operative assessment

## 2022-10-08 NOTE — Anesthesia Postprocedure Evaluation (Signed)
Anesthesia Post Note  Patient: Andre Simmons  Procedure(s) Performed: LAPAROSCOPIC CHOLECYSTECTOMY, possible ioc     Patient location during evaluation: PACU Anesthesia Type: General Level of consciousness: awake and alert Pain management: pain level controlled Vital Signs Assessment: post-procedure vital signs reviewed and stable Respiratory status: spontaneous breathing, nonlabored ventilation, respiratory function stable and patient connected to nasal cannula oxygen Cardiovascular status: blood pressure returned to baseline and stable Postop Assessment: no apparent nausea or vomiting Anesthetic complications: no  No notable events documented.  Last Vitals:  Vitals:   10/08/22 1030 10/08/22 1045  BP: (!) 142/99 (!) 149/97  Pulse: 68 66  Resp: 12 15  Temp:    SpO2: 99% 98%    Last Pain:  Vitals:   10/08/22 1045  TempSrc:   PainSc: 2                  Shelton Silvas

## 2022-10-08 NOTE — Op Note (Signed)
Laparoscopic Cholecystectomy with IOC   Indications: This patient presents with recurrent pancreatitis and will undergo laparoscopic cholecystectomy.  Pre-operative Diagnosis: recurrent pancreatitis  Post-operative Diagnosis: Same  Surgeon: Almond Lint   Anesthesia: General endotracheal anesthesia and local  ASA Class: 3  Procedure Details   The patient was seen again in the Holding Room. The risks, benefits, complications, treatment options, and expected outcomes were discussed with the patient. The possibilities of  bleeding, recurrent infection, damage to nearby structures, the need for additional procedures, failure to diagnose a condition, the possible need to convert to an open procedure, and creating a complication requiring transfusion or operation were discussed with the patient. The likelihood of improving the patient's symptoms with return to their baseline status is good.    The patient and/or family concurred with the proposed plan, giving informed consent. The site of surgery properly noted. The patient was taken to OR # 4 and placed supine on the OR table.  SCDs were placed. Antibiotic prophylaxis was administered. General endotracheal anesthesia was then administered and tolerated well. After the induction, the abdomen was prepped with Chloraprep and draped in the sterile fashion. and the procedure verified as Laparoscopic Cholecystectomy with possible Intraoperative Cholangiogram. A Time Out was held and the above information confirmed.  Local anesthetic agent was injected into the skin near the umbilicus and a 1.5 cm supraumbilical vertical incision was made with a #11 blade. I dissected down to the abdominal fascia with blunt dissection.  The fascia was incised vertically and the peritoneal cavity was entered bluntly.  A pursestring suture of 0-Vicryl was placed around the fascial opening.  The Hasson cannula was inserted and secured with the stay suture.  Pneumoperitoneum was  then created with CO2 and tolerated well without any adverse changes in the patient's vital signs. An 11-mm port was placed in the subxiphoid position using his prior incision.  Two 5-mm ports were placed in the right upper quadrant. All skin incisions were infiltrated with a local anesthetic agent before making the incision and placing the trocars.   The patient was placed into reverse Trendelenburg position and was tilted slightly to the patient's left.  The gallbladder was identified, and there were omental adhesions noted.  Adhesions were lysed bluntly and with the electrocautery where indicated, taking care not to injure any adjacent organs or viscus. The fundus of the gallbladder was then grasped and retracted cephalad.  The infundibulum was grasped and retracted laterally, exposing the peritoneum overlying the triangle of Calot. The cystic duct and cystic artery were identified.  These were both skeletonized.  A window between the gallbladder and the liver was obtained to ensure a critical view.  The cystic artery was overlying the cystic duct and this was clipped and divided.     The cystic duct was ligated with a clip distally.   An incision was made in the cystic duct and the [facility] cholangiogram catheter introduced. The catheter was secured using a clip. A cholangiogram was then performed, demonstrating good filling of left and right hepatic ducts, the common bile duct, and prompt entry into the duodenum without filling defects.  The cystic duct was then ligated proximally with clips and divided. The cystic artery was ligated with clips and divided as well.   The gallbladder was dissected from the liver bed in retrograde fashion with the electrocautery. The gallbladder was removed and placed in a retrieval bag.  The gallbladder and bag were then removed through the umbilical port site.  The liver  bed was irrigated and inspected. Hemostasis was achieved with the electrocautery. Copious irrigation was  utilized and was repeatedly aspirated until clear.    Pneumoperitoneum was released as we removed the trocars.   The pursestring suture was used to close the umbilical fascia.  One additional 0-0 vicryl was needed.    4-0 Monocryl was used to close the skin in subcuticular fashion.   The skin was cleaned and dry, and Dermabond was applied. The patient was then extubated and brought to the recovery room in stable condition. Instrument, sponge, and needle counts were correct at closure and at the conclusion of the case.   Findings: Gel like substance in the cystic duct.  Several small stones in the gallbladder.    Estimated Blood Loss: min         Drains: none          Specimens: Gallbladder to pathology       Complications: None; patient tolerated the procedure well.         Disposition: PACU - hemodynamically stable.         Condition: stable

## 2022-10-08 NOTE — Transfer of Care (Signed)
Immediate Anesthesia Transfer of Care Note  Patient: Andre Simmons  Procedure(s) Performed: LAPAROSCOPIC CHOLECYSTECTOMY, possible ioc  Patient Location: PACU  Anesthesia Type:General  Level of Consciousness: awake  Airway & Oxygen Therapy: Patient Spontanous Breathing and Patient connected to face mask oxygen  Post-op Assessment: Report given to RN and Post -op Vital signs reviewed and stable  Post vital signs: Reviewed and stable  Last Vitals:  Vitals Value Taken Time  BP 174/110 10/08/22 0924  Temp    Pulse 110 10/08/22 0925  Resp 14 10/08/22 0925  SpO2 97 % 10/08/22 0925  Vitals shown include unfiled device data.  Last Pain:  Vitals:   10/08/22 0605  TempSrc: Oral  PainSc: 0-No pain         Complications: No notable events documented.

## 2022-10-08 NOTE — Discharge Instructions (Addendum)
[facility] Office Phone Number [phone]   POST OP INSTRUCTIONS  Always review your discharge instruction sheet given to you by the facility where your surgery was performed.  IF YOU HAVE DISABILITY OR FAMILY LEAVE FORMS, YOU MUST BRING THEM TO THE OFFICE FOR PROCESSING.  DO NOT GIVE THEM TO YOUR DOCTOR.  Take 2 tylenol (acetominophen) three times a day for 3 days.  If you still have pain, add ibuprofen with food in between if able to take this (if you have kidney issues or stomach issues, do not take ibuprofen).  If both of those are not enough, add the narcotic pain pill.  If you find you are needing a lot of this overnight after surgery, call the next morning for a refill.   Take your usually prescribed medications unless otherwise directed If you need a refill on your pain medication, please contact your pharmacy.  They will contact our office to request authorization.  Prescriptions will not be filled after 5pm or on week-ends. You should eat very light the first 24 hours after surgery, such as soup, crackers, pudding, etc.  Resume your normal diet the day after surgery It is common to experience some constipation if taking pain medication after surgery.  Increasing fluid intake and taking a stool softener will usually help or prevent this problem from occurring.  A mild laxative (Milk of Magnesia or Miralax) should be taken according to package directions if there are no bowel movements after 48 hours. You may shower in 48 hours.  The surgical glue will flake off in 2-3 weeks.   ACTIVITIES:  No strenuous activity or heavy lifting for 2 weeks.  You may drive when you no longer are taking prescription pain medication, you can comfortably wear a seatbelt, and you can safely maneuver your car and apply brakes. RETURN TO WORK:  __________1-2 weeks_______________ Bonita Quin should see your doctor in the office for a follow-up appointment approximately three-four weeks after your  surgery.    WHEN TO CALL YOUR DOCTOR: Fever over 101.0 Nausea and/or vomiting. Discolored skin/eyes/urine Extreme swelling or bruising. Continued bleeding from incision. Increased pain, redness, or drainage from the incision.  The clinic staff is available to answer your questions during regular business hours.  Please don't hesitate to call and ask to speak to one of the nurses for clinical concerns.  If you have a medical emergency, go to the nearest emergency room or call 911.  A surgeon from [facility] is always on call at the hospital.  For further questions, please visit [email or web address]

## 2022-10-08 NOTE — H&P (Signed)
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 He recently has had several episodes of pancreatitis. He has had normal triglycerides, has not started any new meds. He has also had a normal genetic screen and normal Ig4. He hasn't had any visible stones on CT or MRI. He does have an uncle who had pancreatic cancer and passed away from this. However, on endoscopic ultrasound and MRI he has not had any ductal strictures. He has had pancreatitis at least 4 times since 2020 requiring hospital admission. He felt like he probably had several episodes at home where they started to feel similar but he immediately started drinking just clear liquids and they passed without him having to go to the hospital. He denies any pain with eating. He denies any knowledge of gallbladder problems in his family.  I got a right upper quadrant ultrasound on Mr. Jayaraman 05/2021 since he had not had imaging in a while and had no evidence of gallstones. This was negative. There was some hepatic steatosis, but no gallstones or sludge mention. Pancreas was normal.   INTERVAL HISTORY:  Pt was readmitted 02/2022 after trying to tough it out at home for several days. He had pancreatitis in the head of the pancreas. CBD was dilated at 9 mm on u/s. MR done right afterward did not show any ductal dilation or stones/sludge. It was c/w the pancreatitis in the head/neck and no fluid collections were seen.   Patient had another episode that he was able to tough out at home last month. He also underwent EUS that was negative for any pancreatic lesions or  pancreatic duct abnormalities. He did have a shadowing lesion in the gallbladder that did not move, so likely a polyp.   EUS 08/15/22 Mansouraty EUS impression:  - There was no sign of significant pathology in the common bile duct and in the common hepatic duct.  - A lesion more likely a gallbladder polyp was found in the gallbladder. It does shadow however, so could end up being a small stone ( did not move upon patient maneuvering) .  - Pancreatic parenchymal abnormalities consisting of diffusely increased echogenicity were noted in the pancreatic head, genu of the pancreas and pancreatic body. The pancreatic duct was otherwise normal throughout. More of an appearance of fatty pancreas.  - No malignant- appearing lymph nodes were visualized in the celiac region ( level 20) , peripancreatic region and porta hepatis region.  CT abd/pelvis 03/17/2022 IMPRESSION: 1. Mild acute pancreatitis. Correlation with pancreatic enzymes is recommended. 2. Hepatic steatosis. 3. Postsurgical changes consistent with the patient's history of distal pancreatectomy. 4. Aortic atherosclerosis.  [place] 03/18/2022 IMPRESSION: 1. Dilated common bile duct measuring 9 mm suggest correlation with laboratory values to exclude biliary obstruction if clinical concern for obstruction further evaluation with MRCP is recommended. 2. The liver is echogenic. This is a nonspecific finding but is most commonly seen with fatty infiltration of the liver. There are no obvious focal liver lesions. 3. Status post splenectomy.  MR 03/18/2022 IMPRESSION: Once again mild inflammatory changes of the pancreatic head and neck and surrounding tissues consistent with pancreatitis as provided in the history.  No complicating features such as fluid collection, necrosis.  Surgical changes of distal pancreatectomy and splenectomy.  Diffuse fatty liver infiltration with areas of sparing along the gallbladder fossa margin.  CT follow up  05/30/2022  IMPRESSION: 1. No acute intra-abdominal or pelvic pathology. 2. Fatty liver. 3. Stable postsurgical changes of distal pancreatectomy and splenectomy. 4. Aortic Atherosclerosis (ICD10-I70.0).  Past Medical History:  Diagnosis Date  Arthritis  GERD (gastroesophageal reflux disease)   Past Surgical History:  Procedure Laterality Date  Laparoscopic Pancreatectomy and Splenectomy 11/25/2017  Dr. Donell Beers  Lumbar Four-Five Posterior Lumbar Interbody Fusion 12/08/2018  Dr. Elias Else   No current outpatient medications on file.   No current facility-administered medications for this visit.   Allergies  Allergen Reactions  Adhesive Tape-Silicones Other (See Comments) and Rash  Skin irritation.  Metoclopramide Anxiety   Family History  Problem Relation Name Age of Onset  Thyroid cancer Mother  Lung cancer Maternal Aunt  Pancreatic cancer Maternal Uncle  Breast cancer Maternal Grandmother  Esophageal cancer Maternal Grandmother  Esophageal cancer Maternal Grandfather  Esophageal cancer Paternal Grandmother   Social History   Socioeconomic History  Marital status: Single  Tobacco Use  Smoking status: Former  Current packs/day: 0.00  Types: Cigarettes  Quit date: 2003  Years since quitting: 21.5  Smokeless tobacco: Never  Substance and Sexual Activity  Alcohol use: Never  Drug use: Never   Social Determinants of Health   Food Insecurity: No Food Insecurity (03/18/2022)  Received from [facility], [place]  Hunger Vital Sign  Worried About Running Out of Food in the Last Year: Never true  Ran Out of Food in the Last Year: Never true  Transportation Needs: No Transportation Needs (03/18/2022)  Received from [facility], [place]  PRAPARE - Transportation  Lack of Transportation (Medical): No  Lack of Transportation (Non-Medical): No   REVIEW OF SYSTEMS - PERTINENT POSITIVES ONLY: A complete review of systems negative other than HPI and PMH except for  heartburn  EXAM: There were no vitals filed for this visit.  Wt Readings from Last 3 Encounters:  05/11/21 (!) 116.1 kg (256 lb)   Head: Normocephalic and atraumatic.  Eyes: Conjunctivae are normal. Pupils are equal, round, and reactive to light. No scleral icterus.  Resp: No respiratory distress, normal effort. Abd: Abdomen is soft, non distended and non tender.  Neurological: Alert and oriented to person, place, and time. Coordination normal.  Skin: Skin is warm and dry. No rash noted. No diaphoretic. No erythema. No pallor.  Psychiatric: Normal mood and affect. Normal behavior. Judgment and thought content normal.   LABORATORY RESULTS: Available labs are reviewed   ASSESSMENT AND PLAN: Idiopathic acute pancreatitis without infection or necrosis ([facility]HS-HCC) (primary encounter diagnosis)  Gallbladder polyp  Patient continues to be plagued with episodes of pancreatitis requiring hospital admission. He has not developed any pseudocysts. We still do not know the cause of this.   Due to the continued episodes of pancreatitis and the presence of a gallbladder polyp on endoscopic ultrasound, we will plan to do a laparoscopic cholecystectomy. There is approximately a 25% chance that pancreatitis will resolve after this.  The surgical procedure was described to the patient in detail. The patient was given educational material. I discussed the incision type and location, the location of the gallbladder, the anatomy of the bile ducts and arteries, and the typical progression of surgery. I discussed the possibility of converting to an open operation. I advised of the risks of bleeding, infection,  damage to other structures (such as the bile duct, intestine or liver), bile leak, need for other procedures or surgeries, and post op diarrhea/constipation. We discussed the risk of blood clot. We discussed the recovery period and post operative restrictions. The patient was advised against taking blood  thinners the week before surgery.

## 2022-10-09 ENCOUNTER — Encounter (HOSPITAL_COMMUNITY): Payer: Self-pay | Admitting: General Surgery

## 2022-10-29 ENCOUNTER — Encounter: Payer: Self-pay | Admitting: General Surgery

## 2023-03-11 ENCOUNTER — Emergency Department (HOSPITAL_BASED_OUTPATIENT_CLINIC_OR_DEPARTMENT_OTHER): Payer: Medicare HMO

## 2023-03-11 ENCOUNTER — Inpatient Hospital Stay (HOSPITAL_BASED_OUTPATIENT_CLINIC_OR_DEPARTMENT_OTHER)
Admission: EM | Admit: 2023-03-11 | Discharge: 2023-03-17 | DRG: 439 | Disposition: A | Discharge status: Home / Self Care | Payer: Medicare HMO | Attending: Internal Medicine | Admitting: Internal Medicine

## 2023-03-11 ENCOUNTER — Other Ambulatory Visit: Payer: Self-pay

## 2023-03-11 ENCOUNTER — Encounter (HOSPITAL_BASED_OUTPATIENT_CLINIC_OR_DEPARTMENT_OTHER): Payer: Self-pay | Admitting: Radiology

## 2023-03-11 DIAGNOSIS — Z9049 Acquired absence of other specified parts of digestive tract: Secondary | ICD-10-CM | POA: Diagnosis not present

## 2023-03-11 DIAGNOSIS — K861 Other chronic pancreatitis: Principal | ICD-10-CM | POA: Diagnosis present

## 2023-03-11 DIAGNOSIS — K760 Fatty (change of) liver, not elsewhere classified: Secondary | ICD-10-CM | POA: Diagnosis present

## 2023-03-11 DIAGNOSIS — Z803 Family history of malignant neoplasm of breast: Secondary | ICD-10-CM | POA: Diagnosis not present

## 2023-03-11 DIAGNOSIS — R935 Abnormal findings on diagnostic imaging of other abdominal regions, including retroperitoneum: Secondary | ICD-10-CM | POA: Diagnosis not present

## 2023-03-11 DIAGNOSIS — Z800 Family history of malignant neoplasm of digestive organs: Secondary | ICD-10-CM | POA: Diagnosis not present

## 2023-03-11 DIAGNOSIS — Z808 Family history of malignant neoplasm of other organs or systems: Secondary | ICD-10-CM

## 2023-03-11 DIAGNOSIS — E875 Hyperkalemia: Secondary | ICD-10-CM | POA: Diagnosis present

## 2023-03-11 DIAGNOSIS — K219 Gastro-esophageal reflux disease without esophagitis: Secondary | ICD-10-CM | POA: Diagnosis present

## 2023-03-11 DIAGNOSIS — Z9081 Acquired absence of spleen: Secondary | ICD-10-CM | POA: Diagnosis not present

## 2023-03-11 DIAGNOSIS — K8500 Idiopathic acute pancreatitis without necrosis or infection: Principal | ICD-10-CM | POA: Diagnosis present

## 2023-03-11 DIAGNOSIS — D75838 Other thrombocytosis: Secondary | ICD-10-CM | POA: Diagnosis present

## 2023-03-11 DIAGNOSIS — R9431 Abnormal electrocardiogram [ECG] [EKG]: Secondary | ICD-10-CM | POA: Diagnosis not present

## 2023-03-11 DIAGNOSIS — Z8589 Personal history of malignant neoplasm of other organs and systems: Secondary | ICD-10-CM

## 2023-03-11 DIAGNOSIS — Z801 Family history of malignant neoplasm of trachea, bronchus and lung: Secondary | ICD-10-CM

## 2023-03-11 DIAGNOSIS — R531 Weakness: Secondary | ICD-10-CM | POA: Diagnosis not present

## 2023-03-11 DIAGNOSIS — E860 Dehydration: Secondary | ICD-10-CM | POA: Diagnosis present

## 2023-03-11 DIAGNOSIS — I10 Essential (primary) hypertension: Secondary | ICD-10-CM | POA: Diagnosis present

## 2023-03-11 DIAGNOSIS — K529 Noninfective gastroenteritis and colitis, unspecified: Secondary | ICD-10-CM | POA: Diagnosis present

## 2023-03-11 DIAGNOSIS — G8929 Other chronic pain: Secondary | ICD-10-CM | POA: Diagnosis present

## 2023-03-11 DIAGNOSIS — Z79899 Other long term (current) drug therapy: Secondary | ICD-10-CM

## 2023-03-11 DIAGNOSIS — R109 Unspecified abdominal pain: Secondary | ICD-10-CM | POA: Diagnosis present

## 2023-03-11 DIAGNOSIS — Z743 Need for continuous supervision: Secondary | ICD-10-CM | POA: Diagnosis not present

## 2023-03-11 DIAGNOSIS — Z87891 Personal history of nicotine dependence: Secondary | ICD-10-CM

## 2023-03-11 DIAGNOSIS — Z859 Personal history of malignant neoplasm, unspecified: Secondary | ICD-10-CM

## 2023-03-11 DIAGNOSIS — Z888 Allergy status to other drugs, medicaments and biological substances status: Secondary | ICD-10-CM | POA: Diagnosis not present

## 2023-03-11 DIAGNOSIS — M549 Dorsalgia, unspecified: Secondary | ICD-10-CM | POA: Diagnosis present

## 2023-03-11 DIAGNOSIS — K859 Acute pancreatitis without necrosis or infection, unspecified: Principal | ICD-10-CM | POA: Insufficient documentation

## 2023-03-11 DIAGNOSIS — F32A Depression, unspecified: Secondary | ICD-10-CM | POA: Diagnosis present

## 2023-03-11 DIAGNOSIS — Z8679 Personal history of other diseases of the circulatory system: Secondary | ICD-10-CM

## 2023-03-11 DIAGNOSIS — E871 Hypo-osmolality and hyponatremia: Secondary | ICD-10-CM | POA: Diagnosis present

## 2023-03-11 DIAGNOSIS — E872 Acidosis, unspecified: Secondary | ICD-10-CM | POA: Diagnosis not present

## 2023-03-11 DIAGNOSIS — R1013 Epigastric pain: Secondary | ICD-10-CM | POA: Diagnosis not present

## 2023-03-11 DIAGNOSIS — Z90411 Acquired partial absence of pancreas: Secondary | ICD-10-CM

## 2023-03-11 DIAGNOSIS — N3289 Other specified disorders of bladder: Secondary | ICD-10-CM | POA: Diagnosis not present

## 2023-03-11 DIAGNOSIS — R1110 Vomiting, unspecified: Secondary | ICD-10-CM | POA: Diagnosis present

## 2023-03-11 LAB — DIFFERENTIAL
Abs Immature Granulocytes: 0.06 10*3/uL (ref 0.00–0.07)
Basophils Absolute: 0.1 10*3/uL (ref 0.0–0.1)
Basophils Relative: 0 %
Eosinophils Absolute: 0.1 10*3/uL (ref 0.0–0.5)
Eosinophils Relative: 0 %
Immature Granulocytes: 0 %
Lymphocytes Relative: 13 %
Lymphs Abs: 2.8 10*3/uL (ref 0.7–4.0)
Monocytes Absolute: 1.4 10*3/uL — ABNORMAL HIGH (ref 0.1–1.0)
Monocytes Relative: 7 %
Neutro Abs: 16.5 10*3/uL — ABNORMAL HIGH (ref 1.7–7.7)
Neutrophils Relative %: 80 %

## 2023-03-11 LAB — COMPREHENSIVE METABOLIC PANEL
ALT: 51 U/L — ABNORMAL HIGH (ref 0–44)
ALT: 43 U/L (ref 0–44)
AST: 35 U/L (ref 15–41)
AST: 22 U/L (ref 15–41)
Albumin: 4.7 g/dL (ref 3.5–5.0)
Albumin: 4 g/dL (ref 3.5–5.0)
Alkaline Phosphatase: 94 U/L (ref 38–126)
Alkaline Phosphatase: 84 U/L (ref 38–126)
Anion gap: 13 (ref 5–15)
Anion gap: 10 (ref 5–15)
BUN: 15 mg/dL (ref 6–20)
BUN: 14 mg/dL (ref 6–20)
CO2: 19 mmol/L — ABNORMAL LOW (ref 22–32)
CO2: 24 mmol/L (ref 22–32)
Calcium: 9.3 mg/dL (ref 8.9–10.3)
Calcium: 9 mg/dL (ref 8.9–10.3)
Chloride: 100 mmol/L (ref 98–111)
Chloride: 99 mmol/L (ref 98–111)
Creatinine, Ser: 1.1 mg/dL (ref 0.61–1.24)
Creatinine, Ser: 0.92 mg/dL (ref 0.61–1.24)
GFR, Estimated: >60 mL/min (ref >60)
GFR, Estimated: >60 mL/min (ref >60)
Glucose, Bld: 132 mg/dL — ABNORMAL HIGH (ref 70–99)
Glucose, Bld: 98 mg/dL (ref 70–99)
Potassium: 5.3 mmol/L — ABNORMAL HIGH (ref 3.5–5.1)
Potassium: 3.8 mmol/L (ref 3.5–5.1)
Sodium: 132 mmol/L — ABNORMAL LOW (ref 135–145)
Sodium: 133 mmol/L — ABNORMAL LOW (ref 135–145)
Total Bilirubin: 1.6 mg/dL — ABNORMAL HIGH (ref 0.0–1.2)
Total Bilirubin: 0.8 mg/dL (ref 0.0–1.2)
Total Protein: 9.2 g/dL — ABNORMAL HIGH (ref 6.5–8.1)
Total Protein: 7.9 g/dL (ref 6.5–8.1)

## 2023-03-11 LAB — URINALYSIS, ROUTINE W REFLEX MICROSCOPIC
Bilirubin Urine: NEGATIVE
Glucose, UA: NEGATIVE mg/dL
Hgb urine dipstick: NEGATIVE
Ketones, ur: 40 mg/dL — AB
Leukocytes,Ua: NEGATIVE
Nitrite: NEGATIVE
Protein, ur: 30 mg/dL — AB
Specific Gravity, Urine: 1.01 (ref 1.005–1.030)
pH: 7 (ref 5.0–8.0)

## 2023-03-11 LAB — CBC
HCT: 46 % (ref 39.0–52.0)
Hemoglobin: 16.2 g/dL (ref 13.0–17.0)
MCH: 32.4 pg (ref 26.0–34.0)
MCHC: 35.2 g/dL (ref 30.0–36.0)
MCV: 92 fL (ref 80.0–100.0)
Platelets: 490 10*3/uL — ABNORMAL HIGH (ref 150–400)
RBC: 5 MIL/uL (ref 4.22–5.81)
RDW: 13 % (ref 11.5–15.5)
WBC: 21 10*3/uL — ABNORMAL HIGH (ref 4.0–10.5)
nRBC: 0 % (ref 0.0–0.2)

## 2023-03-11 LAB — TROPONIN I (HIGH SENSITIVITY): Troponin I (High Sensitivity): 7 ng/L (ref <18)

## 2023-03-11 LAB — URINALYSIS, MICROSCOPIC (REFLEX)
RBC / HPF: NONE SEEN RBC/hpf (ref 0–5)
WBC, UA: NONE SEEN WBC/hpf (ref 0–5)

## 2023-03-11 LAB — LIPASE, BLOOD: Lipase: 35 U/L (ref 11–51)

## 2023-03-11 MED ORDER — LIDOCAINE VISCOUS HCL 2 % MT SOLN
15.0000 mL | Freq: Once | OROMUCOSAL | Status: AC
  Administered 2023-03-11: 15 mL via ORAL
  Filled 2023-03-11: qty 15

## 2023-03-11 MED ORDER — PROMETHAZINE HCL 25 MG/ML IJ SOLN
INTRAMUSCULAR | Status: AC
  Filled 2023-03-11: qty 1

## 2023-03-11 MED ORDER — ALUM & MAG HYDROXIDE-SIMETH 200-200-20 MG/5ML PO SUSP
30.0000 mL | Freq: Once | ORAL | Status: AC
  Administered 2023-03-11: 30 mL via ORAL
  Filled 2023-03-11: qty 30

## 2023-03-11 MED ORDER — HYDROMORPHONE HCL 1 MG/ML IJ SOLN
1.0000 mg | INTRAMUSCULAR | Status: DC | PRN
  Administered 2023-03-12: 1 mg via INTRAVENOUS
  Filled 2023-03-11: qty 1

## 2023-03-11 MED ORDER — IOHEXOL 300 MG/ML  SOLN
125.0000 mL | Freq: Once | INTRAMUSCULAR | Status: AC | PRN
  Administered 2023-03-11: 125 mL via INTRAVENOUS

## 2023-03-11 MED ORDER — HYDROMORPHONE HCL 1 MG/ML IJ SOLN
1.0000 mg | Freq: Once | INTRAMUSCULAR | Status: AC
  Administered 2023-03-11: 1 mg via INTRAVENOUS
  Filled 2023-03-11: qty 1

## 2023-03-11 MED ORDER — LACTATED RINGERS IV BOLUS
2000.0000 mL | Freq: Once | INTRAVENOUS | Status: AC
  Administered 2023-03-11: 2000 mL via INTRAVENOUS

## 2023-03-11 MED ORDER — SODIUM CHLORIDE 0.9 % IV SOLN
12.5000 mg | Freq: Four times a day (QID) | INTRAVENOUS | Status: DC | PRN
  Administered 2023-03-11: 12.5 mg via INTRAVENOUS
  Filled 2023-03-11: qty 0.5

## 2023-03-11 MED ORDER — ONDANSETRON 4 MG PO TBDP
4.0000 mg | ORAL_TABLET | Freq: Once | ORAL | Status: AC | PRN
  Administered 2023-03-11: 4 mg via ORAL
  Filled 2023-03-11: qty 1

## 2023-03-11 MED ORDER — KETOROLAC TROMETHAMINE 15 MG/ML IJ SOLN
15.0000 mg | Freq: Once | INTRAMUSCULAR | Status: AC
  Administered 2023-03-11: 15 mg via INTRAVENOUS
  Filled 2023-03-11: qty 1

## 2023-03-11 NOTE — ED Triage Notes (Signed)
 Pt states he is having a pancreatitis flare up. Pt states he had his gallbladder removed 2 months ago to help stop the flare ups and this is the first that he has had since. PT states that this one is worse than the ones before. PT endorses N/V/D that started Sunday night. Pt states he tried to manage it at home but feels like he may be getting dehydrated.

## 2023-03-11 NOTE — ED Notes (Signed)
 ED Provider at bedside.

## 2023-03-11 NOTE — ED Provider Notes (Signed)
 [name] [facility] Provider Note   CSN: [id number] Arrival date & time: 03/11/23  1452     History  Chief Complaint  Patient presents with   Abdominal Pain    Andre Simmons is a 48 y.o. male.  With a history of chronic pancreatitis, neuroendocrine tumor of the pancreas with resection in 2019, s/p splenectomy in 2019, GERD, depression, chronic back pain presenting to the ED for evaluation of abdominal pain.  He believes he is having a flare of his pancreatitis.  He reports generalized abdominal pain, worse in the epigastric region.  Symptoms began early Monday morning and have progressively gotten worse.  He reports approximately 20 episodes of emesis over the past 24 hours.  Denies any hematemesis but states that it is dark brown in color.  Not coffee-ground.  He reports diarrhea at baseline, no recent change.  No melena or hematochezia.  His recurrent pancreatitis was initially thought to be due to his gallbladder so he had a cholecystectomy in August 2024.  This is his first recurrence of abdominal pain since the surgery.  He states he has taken Tylenol  with approximately 1 hour of relief of his symptoms.  He has not use anything else.  He is on a clear liquid diets.  He denies any chest pain or shortness of breath.  No fevers or chills.  No recent antibiotic use or travel.  He denies alcohol  use, smoking, recreational drug use.  He denies dysuria, frequency, urgency, hematuria.  He reports having a recent upper respiratory infection and has a lingering dry cough.   Abdominal Pain Associated symptoms: diarrhea, nausea and vomiting        Home Medications Prior to Admission medications   Medication Sig Start Date End Date Taking? Authorizing Provider  amLODipine  (NORVASC ) 5 MG tablet Take 1 tablet (5 mg total) by mouth daily. Patient not taking: Reported on 09/27/2022 03/23/22 04/22/22  Zella Katha HERO, MD  Cholecalciferol (VITAMIN D-3 PO) Take 1  capsule by mouth daily.    [provider]  Cyanocobalamin (VITAMIN B-12 PO) Take 1 tablet by mouth daily.    [provider]  docusate sodium  (COLACE) 100 MG capsule Take 1 capsule (100 mg total) by mouth 2 (two) times daily. Patient not taking: Reported on 09/27/2022 03/22/22   Zella Katha HERO, MD  omeprazole  (PRILOSEC  OTC) 20 MG tablet Take 2 tablets (40 mg total) by mouth in the morning and at bedtime. Patient taking differently: Take 20 mg by mouth in the morning and at bedtime. 03/22/22 10/08/22  Zella, Mir M, MD  oxyCODONE  (OXY IR/ROXICODONE ) 5 MG immediate release tablet Take 1 tablet (5 mg total) by mouth every 6 (six) hours as needed for severe pain. 10/08/22   Aron Shoulders, MD  polyethylene glycol (MIRALAX  / GLYCOLAX ) 17 g packet Take 17 g by mouth daily. 03/23/22   Zella Katha HERO, MD      Allergies    Adhesive [tape], Other, and Reglan  [metoclopramide ]    Review of Systems   Review of Systems  Gastrointestinal:  Positive for abdominal pain, diarrhea, nausea and vomiting.  All other systems reviewed and are negative.   Physical Exam Updated Vital Signs BP 119/86   Pulse 78   Temp 98.1 F (36.7 C) (Oral)   Resp 18   Ht 6' 2 (1.88 m)   Wt 113.4 kg   SpO2 96%   BMI 32.10 kg/m  Physical Exam Vitals and nursing note reviewed.  Constitutional:      General: He is not in acute distress.    Appearance: He is well-developed.     Comments: Resting comfortably in bed  HENT:     Head: Normocephalic and atraumatic.  Eyes:     Conjunctiva/sclera: Conjunctivae normal.  Cardiovascular:     Rate and Rhythm: Normal rate and regular rhythm.     Heart sounds: No murmur heard. Pulmonary:     Effort: Pulmonary effort is normal. No respiratory distress.     Breath sounds: Normal breath sounds. No wheezing, rhonchi or rales.  Abdominal:     General: Abdomen is protuberant. A surgical scar is present. Bowel sounds are normal.     Palpations: Abdomen is soft.      Tenderness: There is abdominal tenderness in the epigastric area. There is no guarding.  Musculoskeletal:        General: No swelling.     Cervical back: Neck supple.  Skin:    General: Skin is warm and dry.     Capillary Refill: Capillary refill takes less than 2 seconds.  Neurological:     Mental Status: He is alert.  Psychiatric:        Mood and Affect: Mood normal.     ED Results / Procedures / Treatments   Labs (all labs ordered are listed, but only abnormal results are displayed) Labs Reviewed  COMPREHENSIVE METABOLIC PANEL - Abnormal; Notable for the following components:      Result Value   Sodium 132 (*)    Potassium 5.3 (*)    CO2 19 (*)    Glucose, Bld 132 (*)    Total Protein 9.2 (*)    ALT 51 (*)    Total Bilirubin 1.6 (*)    All other components within normal limits  CBC - Abnormal; Notable for the following components:   WBC 21.0 (*)    Platelets 490 (*)    All other components within normal limits  URINALYSIS, ROUTINE W REFLEX MICROSCOPIC - Abnormal; Notable for the following components:   Color, Urine AMBER (*)    Ketones, ur 40 (*)    Protein, ur 30 (*)    All other components within normal limits  DIFFERENTIAL - Abnormal; Notable for the following components:   Neutro Abs 16.5 (*)    Monocytes Absolute 1.4 (*)    All other components within normal limits  URINALYSIS, MICROSCOPIC (REFLEX) - Abnormal; Notable for the following components:   Bacteria, UA RARE (*)    All other components within normal limits  LIPASE, BLOOD  COMPREHENSIVE METABOLIC PANEL  TROPONIN I (HIGH SENSITIVITY)  TROPONIN I (HIGH SENSITIVITY)    EKG EKG Interpretation Date/Time:  Tuesday March 11 2023 17:06:37 EST Ventricular Rate:  74 PR Interval:  180 QRS Duration:  100 QT Interval:  391 QTC Calculation: 434 R Axis:   -47  Text Interpretation: Sinus rhythm Probable left atrial enlargement LAD, consider left anterior fascicular block No significant change since  last tracing Confirmed by Dreama Longs ([id number]) on 03/11/2023 5:40:51 PM  Radiology CT ABDOMEN PELVIS W CONTRAST Result Date: 03/11/2023 CLINICAL DATA:  Abdominal pain, acute, nonlocalized. EXAM: CT ABDOMEN AND PELVIS WITH CONTRAST TECHNIQUE: Multidetector CT imaging of the abdomen and pelvis was performed using the standard protocol following bolus administration of intravenous contrast. RADIATION DOSE REDUCTION: This exam was performed according to the departmental dose-optimization program which includes automated exposure control, adjustment of the mA and/or kV according to patient size and/or use of iterative  reconstruction technique. CONTRAST:  OMNIPAQUE  IOHEXOL  300 MG/ML  SOLN COMPARISON:  CT scan abdomen and pelvis from 05/29/2022. FINDINGS: Lower chest: The lung bases are clear. No pleural effusion. The heart is normal in size. No pericardial effusion. Hepatobiliary: The liver is normal in size. Non-cirrhotic configuration. No suspicious mass. These is mild diffuse hepatic steatosis. no intrahepatic or extrahepatic bile duct dilation. Gallbladder is surgically absent. Pancreas: There are postsurgical changes from prior distal pancreatectomy. There is mild heterogeneity of the uncinate process of pancreas with surrounding fat stranding, compatible with acute uncomplicated pancreatitis. No associated peripancreatic fluid collection or pancreatic necrosis. No suspicious pancreatic lesion. Spleen: Surgically absent. Adrenals/Urinary Tract: Adrenal glands are unremarkable. No suspicious renal mass. No hydronephrosis. No renal or ureteric calculi. Urinary bladder is under distended, precluding optimal assessment. However, no large mass or stones identified. No perivesical fat stranding. Stomach/Bowel: No disproportionate dilation of the small or large bowel loops. No evidence of abnormal bowel wall thickening or inflammatory changes. The appendix is unremarkable. Vascular/Lymphatic: No ascites or  pneumoperitoneum. No abdominal or pelvic lymphadenopathy, by size criteria. No aneurysmal dilation of the major abdominal arteries. There are mild peripheral atherosclerotic vascular calcifications of the aorta and its major branches. Reproductive: Normal size prostate. Symmetric seminal vesicles. Other: The visualized soft tissues and abdominal wall are unremarkable. Musculoskeletal: No suspicious osseous lesions. There are mild multilevel degenerative changes in the visualized spine. L4-5 posterior spinal fixation noted. IMPRESSION: *Findings compatible with acute uncomplicated uncinate process pancreatitis. *Multiple other nonacute observations, as described above. Electronically Signed   By: Ree Molt M.D.   On: 03/11/2023 18:10    Procedures Procedures    Medications Ordered in ED Medications  promethazine  (PHENERGAN ) 12.5 mg in sodium chloride  0.9 % 50 mL IVPB (0 mg Intravenous Stopped 03/11/23 1750)  promethazine  (PHENERGAN ) 25 MG/ML injection (has no administration in time range)  HYDROmorphone  (DILAUDID ) injection 1 mg (has no administration in time range)  ondansetron  (ZOFRAN -ODT) disintegrating tablet 4 mg (4 mg Oral Given 03/11/23 1507)  lactated ringers  bolus 2,000 mL (0 mLs Intravenous Stopped 03/11/23 2112)  HYDROmorphone  (DILAUDID ) injection 1 mg (1 mg Intravenous Given 03/11/23 1720)  alum & mag hydroxide-simeth (MAALOX/MYLANTA) 200-200-20 MG/5ML suspension 30 mL (30 mLs Oral Given 03/11/23 1722)    And  lidocaine  (XYLOCAINE ) 2 % viscous mouth solution 15 mL (15 mLs Oral Given 03/11/23 1722)  iohexol  (OMNIPAQUE ) 300 MG/ML solution 125 mL (125 mLs Intravenous Contrast Given 03/11/23 1743)    ED Course/ Medical Decision Making/ A&P                                 Medical Decision Making Amount and/or Complexity of Data Reviewed Labs: ordered. Radiology: ordered.  Risk OTC drugs. Prescription drug management. Decision regarding hospitalization.  This patient presents to  the ED for concern of abdominal pain, this involves an extensive number of treatment options, and is a complaint that carries with it a high risk of complications and morbidity. Differential diagnosis of epigastric pain includes: Functional or nonulcer dyspepsia, PUD, GERD, Gastritis, (NSAIDs, alcohol , stress, H. pylori, pernicious anemia), pancreatitis or pancreatic cancer, overeating indigestion (high-fat foods, coffee), drugs (aspirin, antibiotics (eg, macrolides, metronidazole), corticosteroids, digoxin, narcotics, theophylline), gastroparesis, lactose intolerance, malabsorption gastric cancer, parasitic infection, (Giardia, Strongyloides, Ascaris) cholelithiasis, choledocholithiasis, or cholangitis, ACS, pericarditis, pneumonia, abdominal hernia, pregnancy, intestinal ischemia, esophageal rupture, gastric volvulus, hepatitis.   My initial workup includes labs, imaging, symptom control  Additional  history obtained from: Nursing notes from this visit. Previous records within EMR system ED to hospital admissions on 08/15/2022, 03/17/2022, 02/04/2021 for same  I ordered, reviewed and interpreted labs which include: CBC, CMP, lipase, urinalysis.  Leukocytosis of 21.0.  No anemia.  Thrombocytosis of 490.  Hyponatremia 132, hyper kalemia 5.3, bicarb decreased to 19, total protein elevated to 9.2, total bilirubin elevated to 1.6.  Normal anion gap.  Lipase normal.  I ordered imaging studies including CT abdomen pelvis I independently visualized and interpreted imaging which showed uncomplicated pancreatitis I agree with the radiologist interpretation  Cardiac Monitoring:  The patient was maintained on a cardiac monitor.  I personally viewed and interpreted the cardiac monitored which showed an underlying rhythm of: NSR  Consultations Obtained:  Hospitalist Dr. Shona was consulted who will admit.  Afebrile, hemodynamically stable.  48 year old male presenting to the ED for evaluation of epigastric  abdominal pain.  States it feels similar to when he has had pancreatitis in the past.  Recurrent pancreatitis was thought to be secondary to his gallbladder so he had a cholecystectomy in August of last year.  This is his first flare since his cholecystectomy.  He has been attempting a clear liquid diet with no improvement in his symptoms at home.  He reports greater than 20 episodes of emesis in the past 24 hours.  Lab workup initially with hypokalemia of 5.3.  Suspect this is a lab abnormality, lower suspicion for hyperkalemia in a patient with multiple episodes of nausea and vomiting.  Patient also has a leukocytosis of 21.0.  May be reactionary to significant pain and numerous episodes of emesis.  CT abdomen pelvis reveals acute uncomplicated pancreatitis.  Patient reported improvement in his symptoms after treatment in the emergency department, however required multiple doses of analgesics.  I had a shared decision-making conversation with the patient.  He does not feel comfortable being discharged home as he states he typically requires extended admissions for symptom control.  Reports his last hospitalization was 18 days.  Hospitalist was consulted who will admit.  Patient is in agreement with plan.  Stable at time of admission.  Patient's case discussed with Dr. Dreama who agrees with plan to admit  Note: Portions of this report may have been transcribed using voice recognition software. Every effort was made to ensure accuracy; however, inadvertent computerized transcription errors may still be present.         Final Clinical Impression(s) / ED Diagnoses Final diagnoses:  Acute on chronic pancreatitis [facility]    Rx / DC Orders ED Discharge Orders     None         Edwardo Marsa CHRISTELLA DEVONNA 03/11/23 2115    Dreama Longs, MD 03/12/23 1423

## 2023-03-11 NOTE — ED Notes (Signed)
 Pt. Reports he has  been vomiting non stop since Monday and pain since Sunday.  Pt. Reports bouts with pancreatitis and has had his gallbladder out. Pt. Is in pain in his abd. Pt. Also reports no alcohol  and no drugs.  Pt. Reports he has no other symptoms and has had a tumor on his pancreas and his spleen removed.

## 2023-03-12 ENCOUNTER — Encounter (HOSPITAL_COMMUNITY): Payer: Self-pay | Admitting: Internal Medicine

## 2023-03-12 DIAGNOSIS — F32A Depression, unspecified: Secondary | ICD-10-CM | POA: Diagnosis present

## 2023-03-12 DIAGNOSIS — Z808 Family history of malignant neoplasm of other organs or systems: Secondary | ICD-10-CM | POA: Diagnosis not present

## 2023-03-12 DIAGNOSIS — K219 Gastro-esophageal reflux disease without esophagitis: Secondary | ICD-10-CM | POA: Diagnosis present

## 2023-03-12 DIAGNOSIS — R109 Unspecified abdominal pain: Secondary | ICD-10-CM | POA: Diagnosis present

## 2023-03-12 DIAGNOSIS — E860 Dehydration: Secondary | ICD-10-CM | POA: Diagnosis present

## 2023-03-12 DIAGNOSIS — K760 Fatty (change of) liver, not elsewhere classified: Secondary | ICD-10-CM | POA: Diagnosis present

## 2023-03-12 DIAGNOSIS — K529 Noninfective gastroenteritis and colitis, unspecified: Secondary | ICD-10-CM | POA: Insufficient documentation

## 2023-03-12 DIAGNOSIS — G8929 Other chronic pain: Secondary | ICD-10-CM | POA: Diagnosis present

## 2023-03-12 DIAGNOSIS — E872 Acidosis, unspecified: Secondary | ICD-10-CM | POA: Diagnosis present

## 2023-03-12 DIAGNOSIS — Z859 Personal history of malignant neoplasm, unspecified: Secondary | ICD-10-CM

## 2023-03-12 DIAGNOSIS — Z79899 Other long term (current) drug therapy: Secondary | ICD-10-CM | POA: Diagnosis not present

## 2023-03-12 DIAGNOSIS — E875 Hyperkalemia: Secondary | ICD-10-CM | POA: Diagnosis present

## 2023-03-12 DIAGNOSIS — K859 Acute pancreatitis without necrosis or infection, unspecified: Secondary | ICD-10-CM

## 2023-03-12 DIAGNOSIS — D75838 Other thrombocytosis: Secondary | ICD-10-CM | POA: Diagnosis present

## 2023-03-12 DIAGNOSIS — K8500 Idiopathic acute pancreatitis without necrosis or infection: Secondary | ICD-10-CM | POA: Diagnosis present

## 2023-03-12 DIAGNOSIS — Z803 Family history of malignant neoplasm of breast: Secondary | ICD-10-CM | POA: Diagnosis not present

## 2023-03-12 DIAGNOSIS — Z90411 Acquired partial absence of pancreas: Secondary | ICD-10-CM | POA: Diagnosis not present

## 2023-03-12 DIAGNOSIS — I10 Essential (primary) hypertension: Secondary | ICD-10-CM | POA: Diagnosis present

## 2023-03-12 DIAGNOSIS — Z8589 Personal history of malignant neoplasm of other organs and systems: Secondary | ICD-10-CM | POA: Diagnosis not present

## 2023-03-12 DIAGNOSIS — Z9049 Acquired absence of other specified parts of digestive tract: Secondary | ICD-10-CM | POA: Diagnosis not present

## 2023-03-12 DIAGNOSIS — M549 Dorsalgia, unspecified: Secondary | ICD-10-CM | POA: Diagnosis present

## 2023-03-12 DIAGNOSIS — R935 Abnormal findings on diagnostic imaging of other abdominal regions, including retroperitoneum: Secondary | ICD-10-CM | POA: Diagnosis not present

## 2023-03-12 DIAGNOSIS — Z801 Family history of malignant neoplasm of trachea, bronchus and lung: Secondary | ICD-10-CM | POA: Diagnosis not present

## 2023-03-12 DIAGNOSIS — Z800 Family history of malignant neoplasm of digestive organs: Secondary | ICD-10-CM | POA: Diagnosis not present

## 2023-03-12 DIAGNOSIS — Z8679 Personal history of other diseases of the circulatory system: Secondary | ICD-10-CM

## 2023-03-12 DIAGNOSIS — Z9081 Acquired absence of spleen: Secondary | ICD-10-CM | POA: Diagnosis not present

## 2023-03-12 DIAGNOSIS — K861 Other chronic pancreatitis: Secondary | ICD-10-CM | POA: Diagnosis present

## 2023-03-12 DIAGNOSIS — E871 Hypo-osmolality and hyponatremia: Secondary | ICD-10-CM | POA: Diagnosis present

## 2023-03-12 DIAGNOSIS — Z87891 Personal history of nicotine dependence: Secondary | ICD-10-CM | POA: Diagnosis not present

## 2023-03-12 DIAGNOSIS — Z888 Allergy status to other drugs, medicaments and biological substances status: Secondary | ICD-10-CM | POA: Diagnosis not present

## 2023-03-12 LAB — TRIGLYCERIDES: Triglycerides: 103 mg/dL (ref <150)

## 2023-03-12 MED ORDER — SODIUM CHLORIDE 0.9 % IV SOLN: 250.0000 mL | INTRAVENOUS | Status: AC | PRN

## 2023-03-12 MED ORDER — SODIUM CHLORIDE 0.9% FLUSH
3.0000 mL | Freq: Two times a day (BID) | INTRAVENOUS | Status: DC
  Administered 2023-03-12 – 2023-03-17 (×9): 3 mL via INTRAVENOUS

## 2023-03-12 MED ORDER — HYDROMORPHONE HCL 1 MG/ML IJ SOLN
1.0000 mg | INTRAMUSCULAR | Status: DC | PRN
  Administered 2023-03-12: 1 mg via INTRAVENOUS
  Filled 2023-03-12: qty 1

## 2023-03-12 MED ORDER — HYDROMORPHONE HCL 2 MG/ML IJ SOLN
2.0000 mg | INTRAMUSCULAR | Status: DC | PRN
  Administered 2023-03-12 – 2023-03-13 (×3): 2 mg via INTRAVENOUS
  Filled 2023-03-12 (×3): qty 1

## 2023-03-12 MED ORDER — ONDANSETRON HCL 4 MG/2ML IJ SOLN: 4.0000 mg | Freq: Four times a day (QID) | INTRAMUSCULAR | Status: DC | PRN

## 2023-03-12 MED ORDER — HYDROMORPHONE HCL 1 MG/ML IJ SOLN
1.0000 mg | INTRAMUSCULAR | Status: DC | PRN
  Administered 2023-03-12 (×5): 1 mg via INTRAVENOUS
  Filled 2023-03-12 (×5): qty 1

## 2023-03-12 MED ORDER — ENOXAPARIN SODIUM 60 MG/0.6ML IJ SOSY
50.0000 mg | PREFILLED_SYRINGE | INTRAMUSCULAR | Status: DC
  Administered 2023-03-12 – 2023-03-14 (×3): 50 mg via SUBCUTANEOUS
  Filled 2023-03-12 (×3): qty 0.6

## 2023-03-12 MED ORDER — SODIUM CHLORIDE 0.9% FLUSH: 3.0000 mL | INTRAVENOUS | Status: DC | PRN

## 2023-03-12 MED ORDER — PANTOPRAZOLE SODIUM 40 MG IV SOLR
40.0000 mg | Freq: Two times a day (BID) | INTRAVENOUS | Status: DC
  Administered 2023-03-12 – 2023-03-13 (×2): 40 mg via INTRAVENOUS
  Filled 2023-03-12 (×2): qty 10

## 2023-03-12 MED ORDER — AMLODIPINE BESYLATE 5 MG PO TABS
5.0000 mg | ORAL_TABLET | Freq: Every day | ORAL | Status: DC
  Filled 2023-03-12: qty 1

## 2023-03-12 MED ORDER — HYDROMORPHONE HCL 1 MG/ML IJ SOLN: 1.0000 mg | INTRAMUSCULAR | Status: DC | PRN

## 2023-03-12 MED ORDER — SODIUM CHLORIDE 0.9% FLUSH
3.0000 mL | Freq: Two times a day (BID) | INTRAVENOUS | Status: DC
  Administered 2023-03-12 – 2023-03-15 (×4): 3 mL via INTRAVENOUS

## 2023-03-12 MED ORDER — DOCUSATE SODIUM 100 MG PO CAPS
100.0000 mg | ORAL_CAPSULE | Freq: Two times a day (BID) | ORAL | Status: DC
Start: 2023-03-12 — End: 2023-03-12
  Filled 2023-03-12: qty 1

## 2023-03-12 MED ORDER — ACETAMINOPHEN 325 MG PO TABS
650.0000 mg | ORAL_TABLET | Freq: Four times a day (QID) | ORAL | Status: DC | PRN
  Administered 2023-03-12: 650 mg via ORAL
  Filled 2023-03-12: qty 2

## 2023-03-12 MED ORDER — MORPHINE SULFATE (PF) 2 MG/ML IV SOLN: 1.0000 mg | INTRAVENOUS | Status: DC | PRN

## 2023-03-12 MED ORDER — LACTATED RINGERS IV SOLN: INTRAVENOUS | Status: AC

## 2023-03-12 NOTE — Progress Notes (Signed)
 Admission notes 2000: new patient came from [facility] Pt is A&O x 4. Ambulates independently. VSS, on room air. C/o 9/10 abdominal pain. IV dilaudid  given as ordered.  Dr. Subrina Sundil notified of patient arrival to unit and MD came at bedside to evaluate.  Medications given as ordered.   Educated pt on falls and safety precautions, call bell, plan of care and clear liquid diet order- pt verbalizes understanding. Safety maintained. Call bell in reach. Will continue to monitor.

## 2023-03-12 NOTE — H&P (Addendum)
 History and Physical    Andre Simmons UXL:[id number] DOB: [birth year] DOA: 03/11/2023  PCP: Barbar Levine, MD   Patient coming from: Home   Chief Complaint:  Chief Complaint  Patient presents with   Abdominal Pain   ED TRIAGE note:  HPI:  Andre Simmons is a 48 y.o. male with medical history significant of chronic idiopathic pancreatitis, neuroendocrine tumor of the pancreas status post distal pancreectomy and splenectomy to the 19, cholecystectomy August 2014, GERD, chronic depression and chronic back pain presented to med [facility] emergency department complaining of generalized abdominal pain with associated worsening epigastric region.  Patient reported that begins early in the mildly morning and progressively getting worse.  Reported he has 20 episode of emesis over last 24 hours.  Denies any noticing blood in the vomiting but reported that dark brown-colored stool.  Denies any bright red blood per rectum.  Denies any melena hematochezia.  Patient reported that he has history of recurrent pancreatitis in the past and which is why he had a cholecystectomy in August 2024.  Since the surgery he has these episodes of pancreatitis for the very first time.  Patient denies any fever and chill.  Denies any alcohol  use.  He is able to tolerate clear liquid diet only.  Denies any chest pain shortness of breath. Patient reported follows [facility] outpatient.   ED Course:  At presentation to ED patient is hemodynamically stable. CBC showing leukocytosis 21 and elevated lactate count 490. CMP showing low sodium 132, elevated potassium 5.3 which is improved to 3.8, bicarb improved 19-24, normal AST, ALT, ALP and bilirubin level. Troponin x 2 unremarkable.  EKG with sinus rhythm heart rate 71.  CT abdomen pelvis showed acute uncomplicated uncinate process pancreatitis.  Normal hepatic configuration.  No mass.  Mild diffuse hepatic steatosis.  In the ED patient has been treated  with Toradol , hydromorphone  and 2 L of LR bolus.  Patient has been transferred to [facility] for further management of acute on chronic pancreatitis.  Significant labs in the ED: Lab Orders         Lipase, blood         Comprehensive metabolic panel         CBC         Urinalysis, Routine w reflex microscopic -Urine, Clean Catch         Differential         Comprehensive metabolic panel         Urinalysis, Microscopic (reflex)         Lipid panel         Triglycerides         Comprehensive metabolic panel         CBC       Review of Systems:  Review of Systems  Constitutional:  Positive for malaise/fatigue. Negative for chills, fever and weight loss.  Respiratory:  Negative for cough and shortness of breath.   Cardiovascular:  Negative for chest pain.  Gastrointestinal:  Positive for abdominal pain, diarrhea, nausea and vomiting. Negative for blood in stool, constipation, heartburn and melena.  Genitourinary:  Negative for dysuria.  Musculoskeletal:  Negative for back pain.  Neurological:  Negative for dizziness and headaches.  Psychiatric/Behavioral:  The patient is not nervous/anxious.     Past Medical History:  Diagnosis Date   Arthritis    Chronic back pain    Depression    Family history of breast cancer    Family  history of pancreatic cancer    GERD (gastroesophageal reflux disease)    Knee pain    Pancreatic tumor     Past Surgical History:  Procedure Laterality Date   BACK SURGERY  03/2018   Dr Gwendlyn Lemmings.  L4-L5, S1 micro discectomy   BIOPSY  08/15/2022   Procedure: BIOPSY;  Surgeon: Brice Campi Albino Alu., MD;  Location: [facility];  Service: Gastroenterology;;   CHOLECYSTECTOMY N/A 10/08/2022   Procedure: LAPAROSCOPIC CHOLECYSTECTOMY, possible ioc;  Surgeon: Lockie Rima, MD;  Location: [facility];  Service: General;  Laterality: N/A;  POSSIBLE IOC   ESOPHAGOGASTRODUODENOSCOPY (EGD) WITH PROPOFOL  N/A 08/15/2022   Procedure: ESOPHAGOGASTRODUODENOSCOPY  (EGD) WITH PROPOFOL ;  Surgeon: Normie Becton., MD;  Location: [facility];  Service: Gastroenterology;  Laterality: N/A;   EUS N/A 09/25/2017   Procedure: UPPER ENDOSCOPIC ULTRASOUND (EUS) LINEAR;  Surgeon: Janel Medford, MD;  Location: [facility];  Service: Endoscopy;  Laterality: N/A;  Radial and Linear   EUS N/A 08/15/2022   Procedure: UPPER ENDOSCOPIC ULTRASOUND (EUS) RADIAL;  Surgeon: Normie Becton., MD;  Location: [facility];  Service: Gastroenterology;  Laterality: N/A;   FINE NEEDLE ASPIRATION  09/25/2017   Procedure: FINE NEEDLE ASPIRATION (FNA) RADIAL;  Surgeon: Janel Medford, MD;  Location: [facility];  Service: Endoscopy;;   KNEE SURGERY Left    x 20 surgeries   LAPAROSCOPIC DISTAL PANCREATECTOMY  11/26/2017   Diagnostic laparoscopy, laparoscopic hand-assisted distal    MOUTH SURGERY     as a child, unsure if wisdom teeth or tonsils   PANCREATECTOMY N/A 11/25/2017   Procedure: LAPAROSCOPIC DISTAL PANCREATECTOMY AND SPLENECTOMY ERAS PATHWAY;  Surgeon: Lockie Rima, MD;  Location: [facility];  Service: General;  Laterality: N/A;   Removal of Spleen  2019     reports that he has quit smoking. He has never used smokeless tobacco. He reports that he does not currently use drugs. He reports that he does not drink alcohol .  Allergies  Allergen Reactions   Adhesive [Tape] Rash and Other (See Comments)    Skin irritation.   Other Dermatitis and Other (See Comments)    dermabond   Reglan  [Metoclopramide ] Anxiety    Family History  Problem Relation Age of Onset   Thyroid cancer Mother 36       papillary   Other Father        MVA at age 31   Lung cancer Maternal Aunt    Pancreatic cancer Maternal Uncle    Esophageal cancer Maternal Grandmother 88       smoker   Breast cancer Maternal Grandmother 22   Esophageal cancer Maternal Grandfather 19       smoker   Esophageal cancer Paternal Grandmother    Healthy Half-Sister    Colon cancer Neg Hx    Rectal  cancer Neg Hx     Prior to Admission medications   Medication Sig Start Date End Date Taking? Authorizing Provider  omeprazole  (PRILOSEC  OTC) 20 MG tablet Take 2 tablets (40 mg total) by mouth in the morning and at bedtime. Patient taking differently: Take 20 mg by mouth in the morning and at bedtime. 03/22/22 03/12/23 Yes Jannette Mend, Mir M, MD  amLODipine  (NORVASC ) 5 MG tablet Take 1 tablet (5 mg total) by mouth daily. Patient not taking: Reported on 03/12/2023 03/23/22 03/12/23  Jannette Mend, Mir M, MD  docusate sodium  (COLACE) 100 MG capsule Take 1 capsule (100 mg total) by mouth 2 (two) times daily. Patient not taking: Reported on 09/27/2022 03/22/22  Jannette Mend, Mir M, MD  oxyCODONE  (OXY IR/ROXICODONE ) 5 MG immediate release tablet Take 1 tablet (5 mg total) by mouth every 6 (six) hours as needed for severe pain. Patient not taking: Reported on 03/12/2023 10/08/22   Byerly, Faera, MD  polyethylene glycol (MIRALAX  / GLYCOLAX ) 17 g packet Take 17 g by mouth daily. Patient not taking: Reported on 03/12/2023 03/23/22   Gaylin Ke, MD     Physical Exam: Vitals:   03/12/23 1743 03/12/23 1830 03/12/23 2007 03/12/23 2010  BP:  (!) 139/92  (!) 144/91  Pulse:  72  69  Resp:  16  17  Temp: 98 F (36.7 C) 98.1 F (36.7 C)  97.9 F (36.6 C)  TempSrc: Oral Oral  Oral  SpO2:  98%  98%  Weight:   107.4 kg   Height:   6\' 2"  (1.88 m)     Physical Exam Vitals reviewed.  Constitutional:      General: He is not in acute distress.    Appearance: He is ill-appearing.  Cardiovascular:     Rate and Rhythm: Normal rate and regular rhythm.  Pulmonary:     Effort: Pulmonary effort is normal.     Breath sounds: Normal breath sounds.  Abdominal:     General: Bowel sounds are normal.     Palpations: Abdomen is soft. There is no shifting dullness, fluid wave or hepatomegaly.     Tenderness: There is abdominal tenderness in the epigastric area. There is no guarding or rebound.  Skin:    Capillary  Refill: Capillary refill takes less than 2 seconds.  Neurological:     Mental Status: He is alert and oriented to person, place, and time.  Psychiatric:        Mood and Affect: Mood normal.      Labs on Admission: I have personally reviewed following labs and imaging studies  CBC: Recent Labs  Lab 03/11/23 1506  WBC 21.0*  NEUTROABS 16.5*  HGB 16.2  HCT 46.0  MCV 92.0  PLT 490*   Basic Metabolic Panel: Recent Labs  Lab 03/11/23 1506 03/11/23 2119  NA 132* 133*  K 5.3* 3.8  CL 100 99  CO2 19* 24  GLUCOSE 132* 98  BUN 15 14  CREATININE 1.10 0.92  CALCIUM 9.3 9.0   GFR: Estimated Creatinine Clearance: 131 mL/min (by C-G formula based on SCr of 0.92 mg/dL). Liver Function Tests: Recent Labs  Lab 03/11/23 1506 03/11/23 2119  AST 35 22  ALT 51* 43  ALKPHOS 94 84  BILITOT 1.6* 0.8  PROT 9.2* 7.9  ALBUMIN 4.7 4.0   Recent Labs  Lab 03/11/23 1506  LIPASE 35   No results for input(s): "AMMONIA" in the last 168 hours. Coagulation Profile: No results for input(s): "INR", "PROTIME" in the last 168 hours. Cardiac Enzymes: Recent Labs  Lab 03/11/23 1858  TROPONINIHS 7   BNP (last 3 results) No results for input(s): "BNP" in the last 8760 hours. HbA1C: No results for input(s): "HGBA1C" in the last 72 hours. CBG: No results for input(s): "GLUCAP" in the last 168 hours. Lipid Profile: No results for input(s): "CHOL", "HDL", "LDLCALC", "TRIG", "CHOLHDL", "LDLDIRECT" in the last 72 hours. Thyroid Function Tests: No results for input(s): "TSH", "T4TOTAL", "FREET4", "T3FREE", "THYROIDAB" in the last 72 hours. Anemia Panel: No results for input(s): "VITAMINB12", "FOLATE", "FERRITIN", "TIBC", "IRON", "RETICCTPCT" in the last 72 hours. Urine analysis:    Component Value Date/Time   COLORURINE AMBER (A) 03/11/2023 2015   APPEARANCEUR  CLEAR 03/11/2023 2015   LABSPEC 1.010 03/11/2023 2015   PHURINE 7.0 03/11/2023 2015   GLUCOSEU NEGATIVE 03/11/2023 2015   HGBUR  NEGATIVE 03/11/2023 2015   BILIRUBINUR NEGATIVE 03/11/2023 2015   KETONESUR 40 (A) 03/11/2023 2015   PROTEINUR 30 (A) 03/11/2023 2015   UROBILINOGEN 0.2 09/19/2009 1400   NITRITE NEGATIVE 03/11/2023 2015   LEUKOCYTESUR NEGATIVE 03/11/2023 2015    Radiological Exams on Admission: I have personally reviewed images CT ABDOMEN PELVIS W CONTRAST Result Date: 03/11/2023 CLINICAL DATA:  Abdominal pain, acute, nonlocalized. EXAM: CT ABDOMEN AND PELVIS WITH CONTRAST TECHNIQUE: Multidetector CT imaging of the abdomen and pelvis was performed using the standard protocol following bolus administration of intravenous contrast. RADIATION DOSE REDUCTION: This exam was performed according to the departmental dose-optimization program which includes automated exposure control, adjustment of the mA and/or kV according to patient size and/or use of iterative reconstruction technique. CONTRAST:  OMNIPAQUE  IOHEXOL  300 MG/ML  SOLN COMPARISON:  CT scan abdomen and pelvis from 05/29/2022. FINDINGS: Lower chest: The lung bases are clear. No pleural effusion. The heart is normal in size. No pericardial effusion. Hepatobiliary: The liver is normal in size. Non-cirrhotic configuration. No suspicious mass. These is mild diffuse hepatic steatosis. no intrahepatic or extrahepatic bile duct dilation. Gallbladder is surgically absent. Pancreas: There are postsurgical changes from prior distal pancreatectomy. There is mild heterogeneity of the uncinate process of pancreas with surrounding fat stranding, compatible with acute uncomplicated pancreatitis. No associated peripancreatic fluid collection or pancreatic necrosis. No suspicious pancreatic lesion. Spleen: Surgically absent. Adrenals/Urinary Tract: Adrenal glands are unremarkable. No suspicious renal mass. No hydronephrosis. No renal or ureteric calculi. Urinary bladder is under distended, precluding optimal assessment. However, no large mass or stones identified. No  perivesical fat stranding. Stomach/Bowel: No disproportionate dilation of the small or large bowel loops. No evidence of abnormal bowel wall thickening or inflammatory changes. The appendix is unremarkable. Vascular/Lymphatic: No ascites or pneumoperitoneum. No abdominal or pelvic lymphadenopathy, by size criteria. No aneurysmal dilation of the major abdominal arteries. There are mild peripheral atherosclerotic vascular calcifications of the aorta and its major branches. Reproductive: Normal size prostate. Symmetric seminal vesicles. Other: The visualized soft tissues and abdominal wall are unremarkable. Musculoskeletal: No suspicious osseous lesions. There are mild multilevel degenerative changes in the visualized spine. L4-5 posterior spinal fixation noted. IMPRESSION: *Findings compatible with acute uncomplicated uncinate process pancreatitis. *Multiple other nonacute observations, as described above. Electronically Signed   By: Beula Brunswick M.D.   On: 03/11/2023 18:10     EKG: My personal interpretation of EKG shows: Sinus rhythm heart rate 74.  Left atrial enlargement.    Assessment/Plan: Principal Problem:   Acute recurrent pancreatitis Active Problems:   Pancreatitis   Vomiting   History of malignant neuroendocrine tumor   History of cholecystectomy   GERD (gastroesophageal reflux disease)   Chronic depression   Hyperkalemia-resolved   Hyperbilirubinemia-resolved   History of essential hypertension   Chronic diarrhea    Assessment and Plan: Acute on chronic pancreatitis History of recurrent pancreatitis status postcholecystectomy History of neuroendocrine tumor status post partial pancreectomy and splenectomy Nausea and vomiting > Patient presenting with generalized abdominal pain which is more prominence in the epigastric region.  Reported vomiting multiple times in last 24 hours.  And dark brown stool.  Denies any melena or hematochezia.  Reported at abstinence from alcohol   use since diagnosis of neuroendocrine tumor 2019. -Patient has history of neuroendocrine tumor s/p  partial pancreatomy and splenectomy 2019.  He has  also history of recurrent pancreatitis status post cholecystectomy 2024.  - Patient has midepigastric abdominal tenderness on palpation.  Normal lipase level.  Normal hepatic function panel.   -Checking triglyceride and lipid panel. - CT abdomen pelvis showed acute uncomplicated uncinate process pancreatitis.  Hepatic steatosis. -Given patient has cholecystectomy in the past deferring MRCP at this point. - Continue to manage with IV fluid and as needed pain medications for management of back pancreatitis.  If patient fails to improve with conservative management need to reach out to [facility] gastroenterology in the daytime for further recommendation - In the ED patient received 2 L of LR bolus. - Continue LR 125 cc/h for 1 day. - Continue clear liquid diet. - Continue Dilaudid  1 mg every 2 hour as needed for moderate and severe pain and morphine  1 mg every 1 hour as needed for breakthrough pain. - Given patient history of GERD but unable to tolerate any oral food instead of oral Protonix  twice daily continue IV Protonix  40 mg twice daily. -Continue Phenergan  as needed -Will advance diet as patient tolerates.  History of GERD -Continue IV Protonix  twice daily instead of oral Protonix  as poor oral tolerance.  History of chronic diarrhea - Patient reported history of chronic loose stool/diarrhea since having cholecystectomy.  Continue to monitor  Hyperkalemia-resolved -Continue to monitor  Hyperbilirubin-resolved -Hyperbilirubin 1.4 in the setting of dehydration.  Has been resolved with IV hydration.  Chronic depression -Not currently on medication.  History of essential hypertension - Reported not taking amlodipine  anymore for last 1 year.  Managing hypertension with diet controlled and lifestyle modification.  DVT prophylaxis:   Lovenox  Code Status:  Full Code Diet: Clear liquid diet. Family Communication:   Family was present at bedside, at the time of interview.  Opportunity was given to ask question and all questions were answered satisfactorily.  Disposition Plan: Once able to tolerate solid food can advance diet.  Tentative discharge to home next 2 to 3 days. Consults: None at this moment Admission status:   Inpatient, Telemetry bed  Severity of Illness: The appropriate patient status for this patient is INPATIENT. Inpatient status is judged to be reasonable and necessary in order to provide the required intensity of service to ensure the patient's safety. The patient's presenting symptoms, physical exam findings, and initial radiographic and laboratory data in the context of their chronic comorbidities is felt to place them at high risk for further clinical deterioration. Furthermore, it is not anticipated that the patient will be medically stable for discharge from the hospital within 2 midnights of admission.   * I certify that at the point of admission it is my clinical judgment that the patient will require inpatient hospital care spanning beyond 2 midnights from the point of admission due to high intensity of service, high risk for further deterioration and high frequency of surveillance required.Aaron Aas    Mikiyah Glasner, MD [facility]  How to contact the [facility] Attending or Consulting provider 7A - 7P or covering provider during after hours 7P -7A, for this patient.  Check the care team in [facility] and look for a) attending/consulting [facility] provider listed and b) the [facility] team listed Log into [email or web address] and use [place]'s universal password to access. If you do not have the password, please contact the hospital operator. Locate the [facility] provider you are looking for under [facility] and page to a number that you can be directly reached. If you still have difficulty reaching the provider, please page  the  DOC (Director on Call) for the Hospitalists listed on amion for assistance.  03/12/2023, 8:48 PM

## 2023-03-12 NOTE — ED Notes (Signed)
 Pain management orders reviewed with pt after pt called out requesting pain medicine. Pt shown ordered of pain medication are every 4 hours. Sts he is unable to wait and his pain is 10/10 currently. Requests to speak to a doctor. EDP sheldon informed.

## 2023-03-12 NOTE — ED Notes (Signed)
Called [facility] for transport.  Spoke with Marcello Moores

## 2023-03-12 NOTE — ED Notes (Signed)
Patient given cup of apple juice

## 2023-03-12 NOTE — H&P (Deleted)
 History and Physical    Andre Simmons YQM:[id number] DOB: [birth year] DOA: 03/11/2023  PCP: Barbar Levine, MD   Patient coming from: Home   Chief Complaint:  Chief Complaint  Patient presents with   Abdominal Pain   ED TRIAGE note:  HPI:  Andre Simmons is a 48 y.o. male with medical history significant of chronic idiopathic pancreatitis, neuroendocrine tumor of the pancreas status post distal pancreectomy and splenectomy to the 19, cholecystectomy August 2014, GERD, chronic depression and chronic back pain presented to med [facility] emergency department complaining of generalized abdominal pain with associated worsening epigastric region.  Patient reported that begins early in the mildly morning and progressively getting worse.  Reported he has 20 episode of emesis over last 24 hours.  Denies any noticing blood in the vomiting but reported that dark brown-colored stool.  Denies any bright red blood per rectum.  Denies any melena hematochezia.  Patient reported that he has history of recurrent pancreatitis in the past and which is why he had a cholecystectomy in August 2024.  Since the surgery he has these episodes of pancreatitis for the very first time.  Patient denies any fever and chill.  Denies any alcohol  use.  He is able to tolerate clear liquid diet only.  Denies any chest pain shortness of breath. Patient reported follows [facility] outpatient.   ED Course:  At presentation to ED patient is hemodynamically stable. CBC showing leukocytosis 21 and elevated lactate count 490. CMP showing low sodium 132, elevated potassium 5.3 which is improved to 3.8, bicarb improved 19-24, normal AST, ALT, ALP and bilirubin level. Troponin x 2 unremarkable.  EKG with sinus rhythm heart rate 71.  CT abdomen pelvis showed acute uncomplicated uncinate process pancreatitis.  Normal hepatic configuration.  No mass.  Mild diffuse hepatic steatosis.  In the ED patient has been treated  with Toradol , hydromorphone  and 2 L of LR bolus.  Patient has been transferred to [facility] for further management of acute on chronic pancreatitis.  Significant labs in the ED: Lab Orders         Lipase, blood         Comprehensive metabolic panel         CBC         Urinalysis, Routine w reflex microscopic -Urine, Clean Catch         Differential         Comprehensive metabolic panel         Urinalysis, Microscopic (reflex)         Lipid panel         Triglycerides         Comprehensive metabolic panel         CBC       Review of Systems:  Review of Systems  Constitutional:  Positive for malaise/fatigue. Negative for chills, fever and weight loss.  Respiratory:  Negative for cough and shortness of breath.   Cardiovascular:  Negative for chest pain.  Gastrointestinal:  Positive for abdominal pain, diarrhea, nausea and vomiting. Negative for blood in stool, constipation, heartburn and melena.  Genitourinary:  Negative for dysuria.  Musculoskeletal:  Negative for back pain.  Neurological:  Negative for dizziness and headaches.  Psychiatric/Behavioral:  The patient is not nervous/anxious.     Past Medical History:  Diagnosis Date   Arthritis    Chronic back pain    Depression    Family history of breast cancer    Family  history of pancreatic cancer    GERD (gastroesophageal reflux disease)    Knee pain    Pancreatic tumor     Past Surgical History:  Procedure Laterality Date   BACK SURGERY  03/2018   Dr Gwendlyn Lemmings.  L4-L5, S1 micro discectomy   BIOPSY  08/15/2022   Procedure: BIOPSY;  Surgeon: Brice Campi Albino Alu., MD;  Location: [facility];  Service: Gastroenterology;;   CHOLECYSTECTOMY N/A 10/08/2022   Procedure: LAPAROSCOPIC CHOLECYSTECTOMY, possible ioc;  Surgeon: Lockie Rima, MD;  Location: [facility];  Service: General;  Laterality: N/A;  POSSIBLE IOC   ESOPHAGOGASTRODUODENOSCOPY (EGD) WITH PROPOFOL  N/A 08/15/2022   Procedure: ESOPHAGOGASTRODUODENOSCOPY  (EGD) WITH PROPOFOL ;  Surgeon: Normie Becton., MD;  Location: [facility];  Service: Gastroenterology;  Laterality: N/A;   EUS N/A 09/25/2017   Procedure: UPPER ENDOSCOPIC ULTRASOUND (EUS) LINEAR;  Surgeon: Janel Medford, MD;  Location: [facility];  Service: Endoscopy;  Laterality: N/A;  Radial and Linear   EUS N/A 08/15/2022   Procedure: UPPER ENDOSCOPIC ULTRASOUND (EUS) RADIAL;  Surgeon: Normie Becton., MD;  Location: [facility];  Service: Gastroenterology;  Laterality: N/A;   FINE NEEDLE ASPIRATION  09/25/2017   Procedure: FINE NEEDLE ASPIRATION (FNA) RADIAL;  Surgeon: Janel Medford, MD;  Location: [facility];  Service: Endoscopy;;   KNEE SURGERY Left    x 20 surgeries   LAPAROSCOPIC DISTAL PANCREATECTOMY  11/26/2017   Diagnostic laparoscopy, laparoscopic hand-assisted distal    MOUTH SURGERY     as a child, unsure if wisdom teeth or tonsils   PANCREATECTOMY N/A 11/25/2017   Procedure: LAPAROSCOPIC DISTAL PANCREATECTOMY AND SPLENECTOMY ERAS PATHWAY;  Surgeon: Lockie Rima, MD;  Location: [facility];  Service: General;  Laterality: N/A;   Removal of Spleen  2019     reports that he has quit smoking. He has never used smokeless tobacco. He reports that he does not currently use drugs. He reports that he does not drink alcohol .  Allergies  Allergen Reactions   Adhesive [Tape] Rash and Other (See Comments)    Skin irritation.   Other Dermatitis and Other (See Comments)    dermabond   Reglan  [Metoclopramide ] Anxiety    Family History  Problem Relation Age of Onset   Thyroid cancer Mother 49       papillary   Other Father        MVA at age 36   Lung cancer Maternal Aunt    Pancreatic cancer Maternal Uncle    Esophageal cancer Maternal Grandmother 82       smoker   Breast cancer Maternal Grandmother 33   Esophageal cancer Maternal Grandfather 67       smoker   Esophageal cancer Paternal Grandmother    Healthy Half-Sister    Colon cancer Neg Hx    Rectal  cancer Neg Hx     Prior to Admission medications   Medication Sig Start Date End Date Taking? Authorizing Provider  omeprazole  (PRILOSEC  OTC) 20 MG tablet Take 2 tablets (40 mg total) by mouth in the morning and at bedtime. Patient taking differently: Take 20 mg by mouth in the morning and at bedtime. 03/22/22 03/12/23 Yes Jannette Mend, Mir M, MD  amLODipine  (NORVASC ) 5 MG tablet Take 1 tablet (5 mg total) by mouth daily. Patient not taking: Reported on 03/12/2023 03/23/22 03/12/23  Jannette Mend, Mir M, MD  docusate sodium  (COLACE) 100 MG capsule Take 1 capsule (100 mg total) by mouth 2 (two) times daily. Patient not taking: Reported on 09/27/2022 03/22/22  Jannette Mend, Mir M, MD  oxyCODONE  (OXY IR/ROXICODONE ) 5 MG immediate release tablet Take 1 tablet (5 mg total) by mouth every 6 (six) hours as needed for severe pain. Patient not taking: Reported on 03/12/2023 10/08/22   Byerly, Faera, MD  polyethylene glycol (MIRALAX  / GLYCOLAX ) 17 g packet Take 17 g by mouth daily. Patient not taking: Reported on 03/12/2023 03/23/22   Gaylin Ke, MD     Physical Exam: Vitals:   03/12/23 1743 03/12/23 1830 03/12/23 2007 03/12/23 2010  BP:  (!) 139/92  (!) 144/91  Pulse:  72  69  Resp:  16  17  Temp: 98 F (36.7 C) 98.1 F (36.7 C)  97.9 F (36.6 C)  TempSrc: Oral Oral  Oral  SpO2:  98%  98%  Weight:   107.4 kg   Height:   6\' 2"  (1.88 m)     Physical Exam Vitals reviewed.  Constitutional:      General: He is not in acute distress.    Appearance: He is ill-appearing.  Cardiovascular:     Rate and Rhythm: Normal rate and regular rhythm.  Pulmonary:     Effort: Pulmonary effort is normal.     Breath sounds: Normal breath sounds.  Abdominal:     General: Bowel sounds are normal.     Palpations: Abdomen is soft. There is no shifting dullness, fluid wave or hepatomegaly.     Tenderness: There is abdominal tenderness in the epigastric area. There is no guarding or rebound.  Skin:    Capillary  Refill: Capillary refill takes less than 2 seconds.  Neurological:     Mental Status: He is alert and oriented to person, place, and time.  Psychiatric:        Mood and Affect: Mood normal.      Labs on Admission: I have personally reviewed following labs and imaging studies  CBC: Recent Labs  Lab 03/11/23 1506  WBC 21.0*  NEUTROABS 16.5*  HGB 16.2  HCT 46.0  MCV 92.0  PLT 490*   Basic Metabolic Panel: Recent Labs  Lab 03/11/23 1506 03/11/23 2119  NA 132* 133*  K 5.3* 3.8  CL 100 99  CO2 19* 24  GLUCOSE 132* 98  BUN 15 14  CREATININE 1.10 0.92  CALCIUM 9.3 9.0   GFR: Estimated Creatinine Clearance: 131 mL/min (by C-G formula based on SCr of 0.92 mg/dL). Liver Function Tests: Recent Labs  Lab 03/11/23 1506 03/11/23 2119  AST 35 22  ALT 51* 43  ALKPHOS 94 84  BILITOT 1.6* 0.8  PROT 9.2* 7.9  ALBUMIN 4.7 4.0   Recent Labs  Lab 03/11/23 1506  LIPASE 35   No results for input(s): "AMMONIA" in the last 168 hours. Coagulation Profile: No results for input(s): "INR", "PROTIME" in the last 168 hours. Cardiac Enzymes: Recent Labs  Lab 03/11/23 1858  TROPONINIHS 7   BNP (last 3 results) No results for input(s): "BNP" in the last 8760 hours. HbA1C: No results for input(s): "HGBA1C" in the last 72 hours. CBG: No results for input(s): "GLUCAP" in the last 168 hours. Lipid Profile: No results for input(s): "CHOL", "HDL", "LDLCALC", "TRIG", "CHOLHDL", "LDLDIRECT" in the last 72 hours. Thyroid Function Tests: No results for input(s): "TSH", "T4TOTAL", "FREET4", "T3FREE", "THYROIDAB" in the last 72 hours. Anemia Panel: No results for input(s): "VITAMINB12", "FOLATE", "FERRITIN", "TIBC", "IRON", "RETICCTPCT" in the last 72 hours. Urine analysis:    Component Value Date/Time   COLORURINE AMBER (A) 03/11/2023 2015   APPEARANCEUR  CLEAR 03/11/2023 2015   LABSPEC 1.010 03/11/2023 2015   PHURINE 7.0 03/11/2023 2015   GLUCOSEU NEGATIVE 03/11/2023 2015   HGBUR  NEGATIVE 03/11/2023 2015   BILIRUBINUR NEGATIVE 03/11/2023 2015   KETONESUR 40 (A) 03/11/2023 2015   PROTEINUR 30 (A) 03/11/2023 2015   UROBILINOGEN 0.2 09/19/2009 1400   NITRITE NEGATIVE 03/11/2023 2015   LEUKOCYTESUR NEGATIVE 03/11/2023 2015    Radiological Exams on Admission: I have personally reviewed images CT ABDOMEN PELVIS W CONTRAST Result Date: 03/11/2023 CLINICAL DATA:  Abdominal pain, acute, nonlocalized. EXAM: CT ABDOMEN AND PELVIS WITH CONTRAST TECHNIQUE: Multidetector CT imaging of the abdomen and pelvis was performed using the standard protocol following bolus administration of intravenous contrast. RADIATION DOSE REDUCTION: This exam was performed according to the departmental dose-optimization program which includes automated exposure control, adjustment of the mA and/or kV according to patient size and/or use of iterative reconstruction technique. CONTRAST:  OMNIPAQUE  IOHEXOL  300 MG/ML  SOLN COMPARISON:  CT scan abdomen and pelvis from 05/29/2022. FINDINGS: Lower chest: The lung bases are clear. No pleural effusion. The heart is normal in size. No pericardial effusion. Hepatobiliary: The liver is normal in size. Non-cirrhotic configuration. No suspicious mass. These is mild diffuse hepatic steatosis. no intrahepatic or extrahepatic bile duct dilation. Gallbladder is surgically absent. Pancreas: There are postsurgical changes from prior distal pancreatectomy. There is mild heterogeneity of the uncinate process of pancreas with surrounding fat stranding, compatible with acute uncomplicated pancreatitis. No associated peripancreatic fluid collection or pancreatic necrosis. No suspicious pancreatic lesion. Spleen: Surgically absent. Adrenals/Urinary Tract: Adrenal glands are unremarkable. No suspicious renal mass. No hydronephrosis. No renal or ureteric calculi. Urinary bladder is under distended, precluding optimal assessment. However, no large mass or stones identified. No  perivesical fat stranding. Stomach/Bowel: No disproportionate dilation of the small or large bowel loops. No evidence of abnormal bowel wall thickening or inflammatory changes. The appendix is unremarkable. Vascular/Lymphatic: No ascites or pneumoperitoneum. No abdominal or pelvic lymphadenopathy, by size criteria. No aneurysmal dilation of the major abdominal arteries. There are mild peripheral atherosclerotic vascular calcifications of the aorta and its major branches. Reproductive: Normal size prostate. Symmetric seminal vesicles. Other: The visualized soft tissues and abdominal wall are unremarkable. Musculoskeletal: No suspicious osseous lesions. There are mild multilevel degenerative changes in the visualized spine. L4-5 posterior spinal fixation noted. IMPRESSION: *Findings compatible with acute uncomplicated uncinate process pancreatitis. *Multiple other nonacute observations, as described above. Electronically Signed   By: Beula Brunswick M.D.   On: 03/11/2023 18:10     EKG: My personal interpretation of EKG shows: Sinus rhythm heart rate 74.  Left atrial enlargement.    Assessment/Plan: Principal Problem:   Acute recurrent pancreatitis Active Problems:   Pancreatitis   Vomiting   History of malignant neuroendocrine tumor   History of cholecystectomy   GERD (gastroesophageal reflux disease)   Chronic depression   Hyperkalemia-resolved   Hyperbilirubinemia-resolved   History of essential hypertension   Chronic diarrhea    Assessment and Plan: Acute on chronic pancreatitis History of recurrent pancreatitis status postcholecystectomy History of neuroendocrine tumor status post partial pancreectomy and splenectomy Nausea and vomiting > Patient presenting with generalized abdominal pain which is more prominence in the epigastric region.  Reported vomiting multiple times in last 24 hours.  And dark brown stool.  Denies any melena or hematochezia.  Reported at abstinence from alcohol   use since diagnosis of neuroendocrine tumor 2019. -Patient has history of neuroendocrine tumor s/p  partial pancreatomy and splenectomy 2019.  He has  also history of recurrent pancreatitis status post cholecystectomy 2024.  - Patient has midepigastric abdominal tenderness on palpation.  Normal lipase level.  Normal hepatic function panel.   -Checking triglyceride and lipid panel. - CT abdomen pelvis showed acute uncomplicated uncinate process pancreatitis.  Hepatic steatosis. -Given patient has cholecystectomy in the past deferring MRCP at this point. - Continue to manage with IV fluid and as needed pain medications for management of back pancreatitis.  If patient fails to improve with conservative management need to reach out to [facility] gastroenterology in the daytime for further recommendation - In the ED patient received 2 L of LR bolus. - Continue LR 125 cc/h for 1 day. - Continue clear liquid diet. - Continue Dilaudid  1 mg every 2 hour as needed for moderate and severe pain and morphine  1 mg every 1 hour as needed for breakthrough pain. - Given patient history of GERD but unable to tolerate any oral food instead of oral Protonix  twice daily continue IV Protonix  40 mg twice daily. -Continue Phenergan  as needed -Will advance diet as patient tolerates.  History of GERD -Continue IV Protonix  twice daily instead of oral Protonix  as poor oral tolerance.  History of chronic diarrhea - Patient reported history of chronic loose stool/diarrhea since having cholecystectomy.  Continue to monitor  Hyperkalemia-resolved -Continue to monitor  Hyperbilirubin-resolved -Hyperbilirubin 1.4 in the setting of dehydration.  Has been resolved with IV hydration.  Chronic depression -Not currently on medication.  History of essential hypertension - Reported not taking amlodipine  anymore for last 1 year.  Managing hypertension with diet controlled and lifestyle modification.  DVT prophylaxis:   Lovenox  Code Status:  Full Code Diet: Clear liquid diet. Family Communication:   Family was present at bedside, at the time of interview.  Opportunity was given to ask question and all questions were answered satisfactorily.  Disposition Plan: Once able to tolerate solid food can advance diet.  Tentative discharge to home next 2 to 3 days. Consults: None at this moment Admission status:   Inpatient, Telemetry bed  Severity of Illness: The appropriate patient status for this patient is INPATIENT. Inpatient status is judged to be reasonable and necessary in order to provide the required intensity of service to ensure the patient's safety. The patient's presenting symptoms, physical exam findings, and initial radiographic and laboratory data in the context of their chronic comorbidities is felt to place them at high risk for further clinical deterioration. Furthermore, it is not anticipated that the patient will be medically stable for discharge from the hospital within 2 midnights of admission.   * I certify that at the point of admission it is my clinical judgment that the patient will require inpatient hospital care spanning beyond 2 midnights from the point of admission due to high intensity of service, high risk for further deterioration and high frequency of surveillance required.Aaron Aas    Tajanae Guilbault, MD [facility]  How to contact the [facility] Attending or Consulting provider 7A - 7P or covering provider during after hours 7P -7A, for this patient.  Check the care team in [facility] and look for a) attending/consulting [facility] provider listed and b) the [facility] team listed Log into [email or web address] and use [facility] universal password to access. If you do not have the password, please contact the hospital operator. Locate the [facility] provider you are looking for under [facility] and page to a number that you can be directly reached. If you still have difficulty reaching the provider, please page  the  DOC (Director on Call) for the Hospitalists listed on amion for assistance.  03/12/2023, 8:47 PM

## 2023-03-13 DIAGNOSIS — K859 Acute pancreatitis without necrosis or infection, unspecified: Secondary | ICD-10-CM | POA: Diagnosis not present

## 2023-03-13 LAB — LIPID PANEL
Cholesterol: 152 mg/dL (ref 0–200)
HDL: 32 mg/dL — ABNORMAL LOW (ref >40)
LDL Cholesterol: 92 mg/dL (ref 0–99)
Total CHOL/HDL Ratio: 4.8 {ratio}
Triglycerides: 140 mg/dL (ref <150)
VLDL: 28 mg/dL (ref 0–40)

## 2023-03-13 LAB — CBC
HCT: 43.1 % (ref 39.0–52.0)
Hemoglobin: 14.6 g/dL (ref 13.0–17.0)
MCH: 32.5 pg (ref 26.0–34.0)
MCHC: 33.9 g/dL (ref 30.0–36.0)
MCV: 96 fL (ref 80.0–100.0)
Platelets: 473 10*3/uL — ABNORMAL HIGH (ref 150–400)
RBC: 4.49 MIL/uL (ref 4.22–5.81)
RDW: 13.3 % (ref 11.5–15.5)
WBC: 14.7 10*3/uL — ABNORMAL HIGH (ref 4.0–10.5)
nRBC: 0 % (ref 0.0–0.2)

## 2023-03-13 LAB — COMPREHENSIVE METABOLIC PANEL
ALT: 46 U/L — ABNORMAL HIGH (ref 0–44)
AST: 31 U/L (ref 15–41)
Albumin: 3.9 g/dL (ref 3.5–5.0)
Alkaline Phosphatase: 97 U/L (ref 38–126)
Anion gap: 9 (ref 5–15)
BUN: 15 mg/dL (ref 6–20)
CO2: 27 mmol/L (ref 22–32)
Calcium: 8.7 mg/dL — ABNORMAL LOW (ref 8.9–10.3)
Chloride: 97 mmol/L — ABNORMAL LOW (ref 98–111)
Creatinine, Ser: 0.91 mg/dL (ref 0.61–1.24)
GFR, Estimated: >60 mL/min (ref >60)
Glucose, Bld: 87 mg/dL (ref 70–99)
Potassium: 4.1 mmol/L (ref 3.5–5.1)
Sodium: 133 mmol/L — ABNORMAL LOW (ref 135–145)
Total Bilirubin: 0.6 mg/dL (ref 0.0–1.2)
Total Protein: 7.9 g/dL (ref 6.5–8.1)

## 2023-03-13 MED ORDER — ALUM & MAG HYDROXIDE-SIMETH 200-200-20 MG/5ML PO SUSP: 30.0000 mL | ORAL | Status: DC | PRN

## 2023-03-13 MED ORDER — HYDROMORPHONE HCL 1 MG/ML IJ SOLN
1.0000 mg | INTRAMUSCULAR | Status: DC | PRN
  Administered 2023-03-13: 2 mg via INTRAVENOUS
  Administered 2023-03-13: 1 mg via INTRAVENOUS
  Administered 2023-03-13: 2 mg via INTRAVENOUS
  Administered 2023-03-13: 1 mg via INTRAVENOUS
  Administered 2023-03-13: 2 mg via INTRAVENOUS
  Administered 2023-03-13: 1 mg via INTRAVENOUS
  Administered 2023-03-13 – 2023-03-14 (×3): 2 mg via INTRAVENOUS
  Administered 2023-03-14: 1 mg via INTRAVENOUS
  Administered 2023-03-14 (×4): 2 mg via INTRAVENOUS
  Filled 2023-03-13 (×9): qty 2
  Filled 2023-03-13: qty 1
  Filled 2023-03-13: qty 2
  Filled 2023-03-13: qty 1
  Filled 2023-03-13: qty 2
  Filled 2023-03-13: qty 1

## 2023-03-13 MED ORDER — ONDANSETRON HCL 4 MG/2ML IJ SOLN
4.0000 mg | Freq: Four times a day (QID) | INTRAMUSCULAR | Status: DC | PRN
  Administered 2023-03-13 – 2023-03-17 (×5): 4 mg via INTRAVENOUS
  Filled 2023-03-13 (×5): qty 2

## 2023-03-13 MED ORDER — LACTATED RINGERS IV SOLN: INTRAVENOUS | Status: AC

## 2023-03-13 MED ORDER — SODIUM CHLORIDE 0.9 % IV SOLN
12.5000 mg | Freq: Four times a day (QID) | INTRAVENOUS | Status: DC | PRN
  Administered 2023-03-14: 12.5 mg via INTRAVENOUS
  Filled 2023-03-13: qty 12.5

## 2023-03-13 NOTE — Progress Notes (Signed)
Initial Nutrition Assessment  DOCUMENTATION CODES:   Not applicable  INTERVENTION:  -Provided education on pancreatitis diet -B complex with vitamin C -Once diet advances:   Boost [employer]  NUTRITION DIAGNOSIS:   Inadequate oral intake related to chronic illness as evidenced by NPO status.  GOAL:   Patient will meet greater than or equal to 90% of their needs  MONITOR:   Diet advancement, PO intake  REASON FOR ASSESSMENT:   Malnutrition Screening Tool    ASSESSMENT:   Pt with PMH of chronic idiopathic pancreatitis, neuroendocrine tumor of pancreas s/p distal pancreectomy and splenectomy, cholecystectomy (2014), GERD, depression. Pt admitted for abdominal pain and vomiting.    Pt currently NPO. Pt reports no appetite since onset of nausea and vomiting this past Sunday. He reports the last thing he had to eat was grilled chicken, corn, and bread with butter on Sunday. He reports having chicken broth when he was still on clear liquids here. He reports normally having a good appetite. He reports having small, frequent meals throughout the day to help prevent any reflux symptoms. He says his usual intake includes chips, air fried chicken nuggets, fish, grilled chicken, bread with butter, and vegetables. He reports changing his diet this past year to include healthier foods to see if it helped the pancreatitis symptoms. Pt reports not liking Ensure or nutrition supplement drinks but is open to trying Boost Breeze if his diet advances. Pt reports taking vitamins B and C 9 months ago but stopped for no particular reason. Pt reports that his doctor told him a multivitamin can cause GI symptoms so he does not take one. Pt is open to trying one again. Will order B complex with vitamin C.   Pt reports maybe having wt loss the past few days from vomiting and diarrhea but is unsure if he has. He denies any wt loss prior to this week. Pt reports a usual body wt of around 240 lbs. Per  chart his current weight is 237 lbs.   Meds: Reviewed  Labs: Sodium low, chloride low, calcium low  NUTRITION - FOCUSED PHYSICAL EXAM:  Flowsheet Row Most Recent Value  Orbital Region Mild depletion  Upper Arm Region No depletion  Thoracic and Lumbar Region No depletion  Buccal Region Mild depletion  Temple Region No depletion  Clavicle Bone Region No depletion  Clavicle and Acromion Bone Region Mild depletion  Scapular Bone Region No depletion  Dorsal Hand No depletion  Patellar Region No depletion  Anterior Thigh Region No depletion  Posterior Calf Region No depletion  Edema (RD Assessment) None  Hair Reviewed  Eyes Reviewed  Mouth Reviewed  Skin Reviewed  Nails Reviewed       Diet Order:   Diet Order             Diet NPO time specified  Diet effective now                   EDUCATION NEEDS:   Education needs have been addressed  Skin:  Skin Assessment: Reviewed RN Assessment  Last BM:  1/14  Height:   Ht Readings from Last 1 Encounters:  03/12/23 6\' 2"  (1.88 m)    Weight:   Wt Readings from Last 1 Encounters:  03/12/23 107.4 kg    Ideal Body Weight:   190 lb  BMI:  Body mass index is 30.4 kg/m.  Estimated Nutritional Needs:   Kcal:  2200-2400  Protein:  110-120 g  Fluid:  2200-2400 mL    Maceo Pro, MS Dietetic Intern

## 2023-03-13 NOTE — Hospital Course (Addendum)
17XW with h/o chronic pancreatitis, neuroendocrine tumor of pancreas s/p distal pancreatectomy/splenectomy (2019), depression, and chronic back pain who presented on 1/14 with abdominal pain.  CT with acute uncomplicated pancreatitis.  Pain is refractory to medications.

## 2023-03-13 NOTE — Progress Notes (Signed)
Patient is A&O x 4. VSS, on room air. Ambulates to bathroom independently. C/o abdominal pain 8/10 pain and pain managed with prn IV dilaudid 2 mg as ordered.   Pt requested IV protonix for his acid reflux and explained to patient that it has been discontinued. Patient requested to speak with the provider. A.Chavez, NP paged via secure chat and updated. NP spoke with the patient. New order for PRN maalox received.   Educated pt on falls and safety precautions, call bell, plan of care, pt verbalizes understanding.   Call bell in reach. Will continue to monitor.

## 2023-03-13 NOTE — Progress Notes (Signed)
Progress Note   Patient: Andre Simmons [id number]:[id number] DOB: 1976-02-08 DOA: 03/11/2023     1 DOS: the patient was seen and examined on 03/13/2023   Brief hospital course: 48yo with h/o chronic pancreatitis, neuroendocrine tumor of pancreas s/p distal pancreatectomy/splenectomy (2019), depression, and chronic back pain who presented on 1/14 with abdominal pain.  CT with acute uncomplicated pancreatitis.  Pain is refractory to medications.  Assessment and Plan:  Acute recurrent pancreatitis Patient with prior h/o acute pancreatitis; he underwent Whipple in the past for neuroendocrine tumor and has had recurrent events since He did undergo cholecystectomy, but gallstones were not seen (only sludge) so this seems unlikely to be the trigger Now with frank pancreatitis by H&P, CT Admitted to med surg Strict NPO for now Aggressive IVF hydration at least for the first 12 hours with LR at 200 cc/hr Pain control with dilaudid; consider PCA if pain is not controlled  Nausea control with Zofran, phenergan for breakthrough The 4 most likely causes for pancreatitis include:             -Gallstones - he is s/p cholecystectomy, unlikely to have retained stones with no prior stones and normal LFTs             -Alcohol - does not drink.             -Medications - He does not appear to be taking medications that place her at increased risk.                         -Hypertriglyceridemia - Normal TG testing   GERD Changed PPI to IV given PO intolerance Will hold for now, as PPIs can increase the risk of pancreatitis  HTN Reports no longer taking amlodipine, will follow  Depression He does not appear to be taking medications for this issue at this time   Chronic pain PDMP reviewed, no chronic home meds  Hepatic steatosis Mild, seen on CT No evidence of cirrhosis    Consultants: None  Procedures: None  Antibiotics: None  30 Day Unplanned Readmission Risk Score    Flowsheet Row ED to  Hosp-Admission (Current) from 03/11/2023 in [facility]  30 Day Unplanned Readmission Risk Score (%) 11.46 Filed at 03/13/2023 0801       This score is the patient's risk of an unplanned readmission within 30 days of being discharged (0 -100%). The score is based on dignosis, age, lab data, medications, orders, and past utilization.   Low:  0-14.9   Medium: 15-21.9   High: 22-29.9   Extreme: 30 and above           Subjective: Ongoing significant abdominal pain with some nausea but no further vomiting.  He has been setting his alarm clock to awake him in order to ask for medication, which was discouraged.   Objective: Vitals:   03/13/23 0801 03/13/23 1231  BP: 111/78 113/78  Pulse: 74 78  Resp: 16 16  Temp: 97.7 F (36.5 C) 97.8 F (36.6 C)  SpO2: 92% 94%    Intake/Output Summary (Last 24 hours) at 03/13/2023 1756 Last data filed at 03/13/2023 0830 Gross per 24 hour  Intake 1262.91 ml  Output 320 ml  Net 942.91 ml   Filed Weights   03/11/23 1502 03/12/23 2007  Weight: 113.4 kg 107.4 kg    Exam:  General:  Appears calm and comfortable and is in NAD Eyes:   EOMI,  normal lids, iris ENT:  grossly normal hearing, lips & tongue, mmm Neck:  no LAD, masses or thyromegaly Cardiovascular:  RRR, no m/r/g. No LE edema.  Respiratory:   CTA bilaterally with no wheezes/rales/rhonchi.  Normal respiratory effort. Abdomen:  soft, diffusely tender, moderately distended, hypoactive but present BS Skin:  no rash or induration seen on limited exam Musculoskeletal:  grossly normal tone BUE/BLE, good ROM, no bony abnormality Psychiatric:  blunted mood and affect, speech fluent and appropriate, AOx3 Neurologic:  CN 2-12 grossly intact, moves all extremities in coordinated fashion  Data Reviewed: I have reviewed the patient's lab results since admission.  Pertinent labs for today include:  Na++ 133 AST 31/ALT 46 Lipids: 152/32/92/140 WBC 14.7,  21 on 1/14 Platelets 473, 490 on 1/14 UA: 40 ketones, 30 protein     Family Communication: None present  Disposition: Status is: Inpatient Remains inpatient appropriate because: ongoing management     Time spent: 50 minutes  Unresulted Labs (From admission, onward)     Start     Ordered   03/13/23 0500  CBC  Daily,   R      03/12/23 2050   03/13/23 0500  Comprehensive metabolic panel  Daily,   R      03/12/23 2050             Author: Jonah Blue, MD 03/13/2023 5:56 PM  For on call review [email or web address].

## 2023-03-13 NOTE — Plan of Care (Signed)

## 2023-03-14 DIAGNOSIS — K8500 Idiopathic acute pancreatitis without necrosis or infection: Secondary | ICD-10-CM

## 2023-03-14 DIAGNOSIS — Z9049 Acquired absence of other specified parts of digestive tract: Secondary | ICD-10-CM

## 2023-03-14 DIAGNOSIS — R935 Abnormal findings on diagnostic imaging of other abdominal regions, including retroperitoneum: Secondary | ICD-10-CM | POA: Diagnosis not present

## 2023-03-14 DIAGNOSIS — K859 Acute pancreatitis without necrosis or infection, unspecified: Secondary | ICD-10-CM | POA: Diagnosis not present

## 2023-03-14 LAB — CBC
HCT: 40.7 % (ref 39.0–52.0)
Hemoglobin: 13.8 g/dL (ref 13.0–17.0)
MCH: 32.5 pg (ref 26.0–34.0)
MCHC: 33.9 g/dL (ref 30.0–36.0)
MCV: 95.8 fL (ref 80.0–100.0)
Platelets: 476 10*3/uL — ABNORMAL HIGH (ref 150–400)
RBC: 4.25 MIL/uL (ref 4.22–5.81)
RDW: 13.1 % (ref 11.5–15.5)
WBC: 12.2 10*3/uL — ABNORMAL HIGH (ref 4.0–10.5)
nRBC: 0 % (ref 0.0–0.2)

## 2023-03-14 LAB — COMPREHENSIVE METABOLIC PANEL
ALT: 48 U/L — ABNORMAL HIGH (ref 0–44)
AST: 31 U/L (ref 15–41)
Albumin: 3.7 g/dL (ref 3.5–5.0)
Alkaline Phosphatase: 92 U/L (ref 38–126)
Anion gap: 12 (ref 5–15)
BUN: 13 mg/dL (ref 6–20)
CO2: 25 mmol/L (ref 22–32)
Calcium: 8.9 mg/dL (ref 8.9–10.3)
Chloride: 100 mmol/L (ref 98–111)
Creatinine, Ser: 0.86 mg/dL (ref 0.61–1.24)
GFR, Estimated: >60 mL/min (ref >60)
Glucose, Bld: 94 mg/dL (ref 70–99)
Potassium: 4.4 mmol/L (ref 3.5–5.1)
Sodium: 137 mmol/L (ref 135–145)
Total Bilirubin: 0.8 mg/dL (ref 0.0–1.2)
Total Protein: 7.5 g/dL (ref 6.5–8.1)

## 2023-03-14 LAB — C-REACTIVE PROTEIN: CRP: 4.6 mg/dL — ABNORMAL HIGH (ref <1.0)

## 2023-03-14 MED ORDER — BOOST / RESOURCE BREEZE PO LIQD CUSTOM
1.0000 | Freq: Three times a day (TID) | ORAL | Status: DC
Start: 2023-03-14 — End: 2023-03-17
  Administered 2023-03-14 – 2023-03-16 (×3): 1 via ORAL

## 2023-03-14 MED ORDER — OXYCODONE HCL 5 MG PO TABS
5.0000 mg | ORAL_TABLET | ORAL | Status: DC | PRN
  Administered 2023-03-14 – 2023-03-15 (×4): 10 mg via ORAL
  Filled 2023-03-14 (×6): qty 2

## 2023-03-14 MED ORDER — ACETAMINOPHEN 325 MG PO TABS
650.0000 mg | ORAL_TABLET | Freq: Four times a day (QID) | ORAL | Status: DC | PRN
  Administered 2023-03-15 (×2): 650 mg via ORAL
  Filled 2023-03-14 (×2): qty 2

## 2023-03-14 MED ORDER — B COMPLEX-C PO TABS
1.0000 | ORAL_TABLET | Freq: Every day | ORAL | Status: DC
  Administered 2023-03-14 – 2023-03-17 (×4): 1 via ORAL
  Filled 2023-03-14 (×4): qty 1

## 2023-03-14 MED ORDER — POLYVINYL ALCOHOL 1.4 % OP SOLN
1.0000 [drp] | OPHTHALMIC | Status: DC | PRN
  Administered 2023-03-14: 1 [drp] via OPHTHALMIC
  Filled 2023-03-14: qty 15

## 2023-03-14 MED ORDER — FAMOTIDINE IN NACL 20-0.9 MG/50ML-% IV SOLN
20.0000 mg | Freq: Two times a day (BID) | INTRAVENOUS | Status: DC
  Administered 2023-03-14 – 2023-03-16 (×4): 20 mg via INTRAVENOUS
  Filled 2023-03-14 (×4): qty 50

## 2023-03-14 MED ORDER — FAMOTIDINE IN NACL 20-0.9 MG/50ML-% IV SOLN
20.0000 mg | Freq: Every day | INTRAVENOUS | Status: DC
  Administered 2023-03-14: 20 mg via INTRAVENOUS
  Filled 2023-03-14: qty 50

## 2023-03-14 MED ORDER — HYDROMORPHONE HCL 1 MG/ML IJ SOLN
1.0000 mg | Freq: Once | INTRAMUSCULAR | Status: AC
  Administered 2023-03-14: 1 mg via INTRAVENOUS
  Filled 2023-03-14: qty 1

## 2023-03-14 NOTE — Plan of Care (Signed)

## 2023-03-14 NOTE — Progress Notes (Signed)
Progress Note   Patient: Andre Simmons [id number]:[id number] DOB: 13-Mar-1975 DOA: 03/11/2023     2 DOS: the patient was seen and examined on 03/14/2023   Brief hospital course: 48yo with h/o chronic pancreatitis, neuroendocrine tumor of pancreas s/p distal pancreatectomy/splenectomy (2019), depression, and chronic back pain who presented on 1/14 with abdominal pain.  CT with acute uncomplicated pancreatitis.  Pain is refractory to medications.  Assessment and Plan:  Acute recurrent pancreatitis Patient with prior h/o acute pancreatitis; he underwent Whipple in the past for neuroendocrine tumor and has had recurrent events since He did undergo cholecystectomy, but gallstones were not seen (only sludge) so this seems unlikely to be the trigger Genetic analysis from 2023 negative for mutations Admitted with frank pancreatitis by H&P, CT Strict NPO -> clear liquid diet Aggressive IVF hydration at least for the first 12 hours with LR at 200 cc/hr Pain control with dilaudid previously - he has been setting his alarm clock for the medication but now that he is taking PO, will change to PO Oxy 5-10 mg q4h prn Nausea control with Zofran, phenergan for breakthrough The 4 most likely causes for pancreatitis include:             -Gallstones - he is s/p cholecystectomy, unlikely to have retained stones with no prior stones and normal LFTs             -Alcohol - does not drink.             -Medications - He does not appear to be taking medications that place her at increased risk.                         -Hypertriglyceridemia - Normal TG testing  Patient requested GI consult, added CRP, pancreatic elastase, and IgG4 Consider addition of Creon post-hospitalization   GERD Changed PPI to IV given PO intolerance Will hold for now, as PPIs can (minimally) increase the risk of pancreatitis Will start IV famotidine, instead   HTN Reports no longer taking amlodipine, will follow   Depression He does not  appear to be taking medications for this issue at this time    Chronic pain PDMP reviewed, no chronic home meds   Hepatic steatosis Mild, seen on CT No evidence of cirrhosis  Nutrition Status: Nutrition Problem: Inadequate oral intake Etiology: chronic illness Signs/Symptoms: NPO status Interventions: Education, Refer to RD note for recommendations If unable to advance diet within the next 24 hours, he may require enteral feeds        Consultants: GI   Procedures: None   Antibiotics: None  30 Day Unplanned Readmission Risk Score    Flowsheet Row ED to Hosp-Admission (Current) from 03/11/2023 in [facility]  30 Day Unplanned Readmission Risk Score (%) 9.92 Filed at 03/14/2023 0801       This score is the patient's risk of an unplanned readmission within 30 days of being discharged (0 -100%). The score is based on dignosis, age, lab data, medications, orders, and past utilization.   Low:  0-14.9   Medium: 15-21.9   High: 22-29.9   Extreme: 30 and above           Subjective: One episode overnight of uncontrolled pain, resulting in emesis.  Otherwise, none in 24 hours.  Not feeling hungry.   Objective: Vitals:   03/13/23 2352 03/14/23 0457  BP: 130/79 (!) 118/101  Pulse: 64 72  Resp: 18  14  Temp: (!) 97.5 F (36.4 C) 97.8 F (36.6 C)  SpO2: 95% 94%    Intake/Output Summary (Last 24 hours) at 03/14/2023 0840 Last data filed at 03/14/2023 0617 Gross per 24 hour  Intake 3784.16 ml  Output 600 ml  Net 3184.16 ml   Filed Weights   03/11/23 1502 03/12/23 2007  Weight: 113.4 kg 107.4 kg    Exam:  General:  Appears calm and comfortable and is in NAD Eyes:   EOMI, normal lids, iris ENT:  grossly normal hearing, lips & tongue, mmm Neck:  no LAD, masses or thyromegaly Cardiovascular:  RRR, no m/r/g. No LE edema.  Respiratory:   CTA bilaterally with no wheezes/rales/rhonchi.  Normal respiratory effort. Abdomen:   soft, still diffusely tender but less so, mildly distended Skin:  no rash or induration seen on limited exam Musculoskeletal:  grossly normal tone BUE/BLE, good ROM, no bony abnormality Psychiatric:  blunted mood and affect, speech fluent and appropriate, AOx3 Neurologic:  CN 2-12 grossly intact, moves all extremities in coordinated fashion  Data Reviewed: I have reviewed the patient's lab results since admission.  Pertinent labs for today include:   Normal BMP AST 31/ALT 48 - stable WBC 12.2, down from 21 on 1/14 Platelets 476, stable    Family Communication: None present  Disposition: Status is: Inpatient Remains inpatient appropriate because: ongoing management     Time spent: 50 minutes  Unresulted Labs (From admission, onward)     Start     Ordered   03/13/23 0500  CBC  Daily,   R      03/12/23 2050   03/13/23 0500  Comprehensive metabolic panel  Daily,   R      03/12/23 2050             Author: Jonah Blue, MD 03/14/2023 8:40 AM  For on call review [email or web address].

## 2023-03-14 NOTE — Consult Note (Signed)
Referring Provider: Dr. Ophelia Charter, [facility] Primary Care Physician:  Charlott Rakes, MD Primary Gastroenterologist:  Dr. Christella Hartigan  Reason for Consultation: Pancreatitis  HPI: Andre Simmons is a 48 y.o. male who has past medical history of NET requiring prior distal pancreatectomy in 2019.  Has had prior pancreatitis.  Underwent cholecystectomy in August 2024.  Is here again for complaints of recurrent pancreatitis.  Presented to med [facility] on 1/14 where he waited for 40 hours before being transferred here.  Currently feeling somewhat better.  Pain medication does help to bring his pain level down.  Would like to have some clear liquids.  CT scan of the abdomen and pelvis with contrast on 03/11/2023 showed findings compatible with acute uncomplicated uncinate process pancreatitis.  Triglycerides are normal.  LFTs are normal.  Lipase is normal.  EGD/EUS June 2024:  EGD impression: - No gross lesions in the entire esophagus. Z- line irregular, 44 cm from the incisors. - 1 cm hiatal hernia. - Erythematous mucosa in the stomach. Biopsied. - Gastric stenosis was found at the pylorus ( traversed with EGD/ ERCP/ echoendoscope) with mild mucosal wrent. - No gross lesions in the duodenal bulb, in the first portion of the duodenum and in the second portion of the duodenum. - Normal major papilla.  EUS impression: - There was no sign of significant pathology in the common bile duct and in the common hepatic duct. - A lesion more likely a gallbladder polyp was found in the gallbladder. It does shadow however, so could end up being a small stone ( did not move upon patient maneuvering) . - Pancreatic parenchymal abnormalities consisting of diffusely increased echogenicity were noted in the pancreatic head, genu of the pancreas and pancreatic body. The pancreatic duct was otherwise normal throughout. More of an appearance of fatty pancreas. - No malignant- appearing lymph nodes were visualized in the celiac  region ( level 20) , peripancreatic region and porta hepatis region.  A. STOMACH, BIOPSY:  - Gastric, transitional and oxyntic type mucosa mild reactive (chemical)  gastropathy and minimal chronic inflammation  - Negative for Helicobacter organisms on HE stain   He denies alcohol use.  This has been his first admission since his EUS and having his gallbladder removed in August 2024.  Has been on PPI therapy since his 71s.   Past Medical History:  Diagnosis Date   Arthritis    Chronic back pain    Depression    Family history of breast cancer    Family history of pancreatic cancer    GERD (gastroesophageal reflux disease)    Knee pain    Pancreatic tumor     Past Surgical History:  Procedure Laterality Date   BACK SURGERY  03/2018   Dr Dutch Quint.  L4-L5, S1 micro discectomy   BIOPSY  08/15/2022   Procedure: BIOPSY;  Surgeon: Meridee Score Netty Starring., MD;  Location: [facility];  Service: Gastroenterology;;   CHOLECYSTECTOMY N/A 10/08/2022   Procedure: LAPAROSCOPIC CHOLECYSTECTOMY, possible ioc;  Surgeon: Almond Lint, MD;  Location: [facility];  Service: General;  Laterality: N/A;  POSSIBLE IOC   ESOPHAGOGASTRODUODENOSCOPY (EGD) WITH PROPOFOL N/A 08/15/2022   Procedure: ESOPHAGOGASTRODUODENOSCOPY (EGD) WITH PROPOFOL;  Surgeon: Lemar Lofty., MD;  Location: [facility];  Service: Gastroenterology;  Laterality: N/A;   EUS N/A 09/25/2017   Procedure: UPPER ENDOSCOPIC ULTRASOUND (EUS) LINEAR;  Surgeon: Rachael Fee, MD;  Location: [facility];  Service: Endoscopy;  Laterality: N/A;  Radial and Linear   EUS N/A 08/15/2022  Procedure: UPPER ENDOSCOPIC ULTRASOUND (EUS) RADIAL;  Surgeon: Meridee Score Netty Starring., MD;  Location: [facility];  Service: Gastroenterology;  Laterality: N/A;   FINE NEEDLE ASPIRATION  09/25/2017   Procedure: FINE NEEDLE ASPIRATION (FNA) RADIAL;  Surgeon: Rachael Fee, MD;  Location: [facility];  Service: Endoscopy;;   KNEE SURGERY Left    x 20  surgeries   LAPAROSCOPIC DISTAL PANCREATECTOMY  11/26/2017   Diagnostic laparoscopy, laparoscopic hand-assisted distal    MOUTH SURGERY     as a child, unsure if wisdom teeth or tonsils   PANCREATECTOMY N/A 11/25/2017   Procedure: LAPAROSCOPIC DISTAL PANCREATECTOMY AND SPLENECTOMY ERAS PATHWAY;  Surgeon: Almond Lint, MD;  Location: [facility];  Service: General;  Laterality: N/A;   Removal of Spleen  2019    Prior to Admission medications   Medication Sig Start Date End Date Taking? Authorizing Provider  omeprazole (PRILOSEC OTC) 20 MG tablet Take 2 tablets (40 mg total) by mouth in the morning and at bedtime. Patient taking differently: Take 20 mg by mouth in the morning and at bedtime. 03/22/22 03/12/23 Yes Kirby Crigler, Mir M, MD  amLODipine (NORVASC) 5 MG tablet Take 1 tablet (5 mg total) by mouth daily. Patient not taking: Reported on 03/12/2023 03/23/22 03/12/23  Kirby Crigler, Mir M, MD  docusate sodium (COLACE) 100 MG capsule Take 1 capsule (100 mg total) by mouth 2 (two) times daily. Patient not taking: Reported on 09/27/2022 03/22/22   Kirby Crigler, Mir M, MD  oxyCODONE (OXY IR/ROXICODONE) 5 MG immediate release tablet Take 1 tablet (5 mg total) by mouth every 6 (six) hours as needed for severe pain. Patient not taking: Reported on 03/12/2023 10/08/22   Almond Lint, MD  polyethylene glycol (MIRALAX / GLYCOLAX) 17 g packet Take 17 g by mouth daily. Patient not taking: Reported on 03/12/2023 03/23/22   Maryln Gottron, MD    Current Facility-Administered Medications  Medication Dose Route Frequency Provider Last Rate Last Admin   acetaminophen (TYLENOL) tablet 650 mg  650 mg Oral Q6H PRN Janalyn Shy, Subrina, MD   650 mg at 03/12/23 2333   alum & mag hydroxide-simeth (MAALOX/MYLANTA) 200-200-20 MG/5ML suspension 30 mL  30 mL Oral Q4H PRN Anthoney Harada, NP       enoxaparin (LOVENOX) injection 50 mg  50 mg Subcutaneous Q24H Sundil, Subrina, MD   50 mg at 03/13/23 2220   famotidine (PEPCID) IVPB 20 mg  premix  20 mg Intravenous Daily Jonah Blue, MD 100 mL/hr at 03/14/23 1008 20 mg at 03/14/23 1008   HYDROmorphone (DILAUDID) injection 1-2 mg  1-2 mg Intravenous Q2H PRN Jonah Blue, MD   1 mg at 03/14/23 1009   lactated ringers infusion   Intravenous Continuous Anthoney Harada, NP 125 mL/hr at 03/14/23 0000 New Bag at 03/14/23 0000   ondansetron (ZOFRAN) injection 4 mg  4 mg Intravenous Q6H PRN Jonah Blue, MD   4 mg at 03/13/23 2348   polyvinyl alcohol (LIQUIFILM TEARS) 1.4 % ophthalmic solution 1 drop  1 drop Both Eyes PRN Jonah Blue, MD   1 drop at 03/14/23 0615   promethazine (PHENERGAN) 12.5 mg in sodium chloride 0.9 % 50 mL IVPB  12.5 mg Intravenous Q6H PRN Jonah Blue, MD       sodium chloride flush (NS) 0.9 % injection 3 mL  3 mL Intravenous Q12H Sundil, Subrina, MD   3 mL at 03/13/23 2017   sodium chloride flush (NS) 0.9 % injection 3 mL  3 mL Intravenous Q12H Tereasa Coop, MD  3 mL at 03/13/23 2017   sodium chloride flush (NS) 0.9 % injection 3 mL  3 mL Intravenous PRN Tereasa Coop, MD        Allergies as of 03/11/2023 - Review Complete 03/11/2023  Allergen Reaction Noted   Adhesive [tape] Rash and Other (See Comments) 12/01/2018   Other Dermatitis 10/29/2022   Reglan [metoclopramide] Anxiety 03/19/2022    Family History  Problem Relation Age of Onset   Thyroid cancer Mother 1       papillary   Other Father        MVA at age 1   Lung cancer Maternal Aunt    Pancreatic cancer Maternal Uncle    Esophageal cancer Maternal Grandmother 42       smoker   Breast cancer Maternal Grandmother 43   Esophageal cancer Maternal Grandfather 7       smoker   Esophageal cancer Paternal Grandmother    Healthy Half-Sister    Colon cancer Neg Hx    Rectal cancer Neg Hx     Social History   Socioeconomic History   Marital status: Single    Spouse name: Not on file   Number of children: 2   Years of education: Not on file   Highest education level: Not  on file  Occupational History   Occupation: Disabled  Tobacco Use   Smoking status: Former   Smokeless tobacco: Never   Tobacco comments:    Quit 14-15 years ago (as of 2019)  Vaping Use   Vaping status: Never Used  Substance and Sexual Activity   Alcohol use: No   Drug use: Not Currently   Sexual activity: Not Currently  Other Topics Concern   Not on file  Social History Narrative   Not on file   Social Drivers of Health   Financial Resource Strain: Not on file  Food Insecurity: No Food Insecurity (03/12/2023)   Hunger Vital Sign    Worried About Running Out of Food in the Last Year: Never true    Ran Out of Food in the Last Year: Never true  Transportation Needs: No Transportation Needs (03/12/2023)   PRAPARE - Administrator, Civil Service (Medical): No    Lack of Transportation (Non-Medical): No  Physical Activity: Not on file  Stress: Not on file  Social Connections: Not on file  Intimate Partner Violence: Not At Risk (03/12/2023)   Humiliation, Afraid, Rape, and Kick questionnaire    Fear of Current or Ex-Partner: No    Emotionally Abused: No    Physically Abused: No    Sexually Abused: No    Review of Systems: ROS otherwise negative except as mentioned in HPI.  Physical Exam: Vital signs in last 24 hours: Temp:  [97.5 F (36.4 C)-98.1 F (36.7 C)] 97.8 F (36.6 C) (01/17 0457) Pulse Rate:  [55-78] 72 (01/17 0457) Resp:  [14-18] 14 (01/17 0457) BP: (113-130)/(78-101) 118/101 (01/17 0457) SpO2:  [94 %-97 %] 94 % (01/17 0457) Last BM Date : 03/10/23 General:  Alert, Well-developed, well-nourished, pleasant and cooperative in NAD Head:  Normocephalic and atraumatic. Eyes:  Sclera clear, no icterus.  Conjunctiva pink. Ears:  Normal auditory acuity. Mouth:  No deformity or lesions.   Lungs:  Clear throughout to auscultation.  No wheezes, crackles, or rhonchi.  Heart:  Regular rate and rhythm; no murmurs, clicks, rubs, or gallops. Abdomen:  Soft,  nondistended.  Bowel sounds present.  Some upper abdominal tenderness to palpation. Msk:  Symmetrical without  gross deformities. Pulses:  Normal pulses noted. Extremities:  Without clubbing or edema. Neurologic:  Alert and oriented x 4;  grossly normal neurologically. Skin:  Intact without significant lesions or rashes. Psych:  Alert and cooperative. Normal mood and affect.  Intake/Output from previous day: 01/16 0701 - 01/17 0700 In: 4024.2 [P.O.:240; I.V.:3784.2] Out: 920 [Urine:920] I Lab Results: Recent Labs    03/11/23 1506 03/13/23 0427 03/14/23 0442  WBC 21.0* 14.7* 12.2*  HGB 16.2 14.6 13.8  HCT 46.0 43.1 40.7  PLT 490* 473* 476*   BMET Recent Labs    03/11/23 2119 03/13/23 0427 03/14/23 0442  NA 133* 133* 137  K 3.8 4.1 4.4  CL 99 97* 100  CO2 24 27 25   GLUCOSE 98 87 94  BUN 14 15 13   CREATININE 0.92 0.91 0.86  CALCIUM 9.0 8.7* 8.9   LFT Recent Labs    03/14/23 0442  PROT 7.5  ALBUMIN 3.7  AST 31  ALT 48*  ALKPHOS 92  BILITOT 0.8   IMPRESSION:  *Acute uncomplicated pancreatitis: He does not have a gallbladder.  Had cholecystectomy in August 2024.  LFTs are essentially normal.  Triglycerides are normal.  At this point it is likely idiopathic.  He has been on OTC PPI, which does have a risk of pancreatitis.  Previous genetic testing negative for pancreas genetic markers in February 2023. *Leukocytosis: 14.7 K today down from 21K yesterday.  PLAN: -Will check an IgG4 as he never had that in June at the time of his EUS. -Will check a CRP and a pancreatic fecal elastase. -Would remain off of PPI for now.  Is on famotidine 20 mg IV twice daily here. -Supportive care/conservative measures. -Clear liquid diet.   Princella Pellegrini. Akhilesh Sassone  03/14/2023, 11:35 AM

## 2023-03-15 DIAGNOSIS — K859 Acute pancreatitis without necrosis or infection, unspecified: Secondary | ICD-10-CM | POA: Diagnosis not present

## 2023-03-15 LAB — COMPREHENSIVE METABOLIC PANEL
ALT: 58 U/L — ABNORMAL HIGH (ref 0–44)
ALT: 63 U/L — ABNORMAL HIGH (ref 0–44)
AST: 37 U/L (ref 15–41)
AST: 41 U/L (ref 15–41)
Albumin: 4 g/dL (ref 3.5–5.0)
Albumin: 4.1 g/dL (ref 3.5–5.0)
Alkaline Phosphatase: 96 U/L (ref 38–126)
Alkaline Phosphatase: 96 U/L (ref 38–126)
Anion gap: 9 (ref 5–15)
Anion gap: 12 (ref 5–15)
BUN: 14 mg/dL (ref 6–20)
BUN: 13 mg/dL (ref 6–20)
CO2: 29 mmol/L (ref 22–32)
CO2: 25 mmol/L (ref 22–32)
Calcium: 9.1 mg/dL (ref 8.9–10.3)
Calcium: 9.2 mg/dL (ref 8.9–10.3)
Chloride: 98 mmol/L (ref 98–111)
Chloride: 99 mmol/L (ref 98–111)
Creatinine, Ser: 0.97 mg/dL (ref 0.61–1.24)
Creatinine, Ser: 0.94 mg/dL (ref 0.61–1.24)
GFR, Estimated: >60 mL/min (ref >60)
GFR, Estimated: >60 mL/min (ref >60)
Glucose, Bld: 82 mg/dL (ref 70–99)
Glucose, Bld: 83 mg/dL (ref 70–99)
Potassium: 3.8 mmol/L (ref 3.5–5.1)
Potassium: 3.9 mmol/L (ref 3.5–5.1)
Sodium: 136 mmol/L (ref 135–145)
Sodium: 136 mmol/L (ref 135–145)
Total Bilirubin: 0.8 mg/dL (ref 0.0–1.2)
Total Bilirubin: 0.8 mg/dL (ref 0.0–1.2)
Total Protein: 7.8 g/dL (ref 6.5–8.1)
Total Protein: 7.8 g/dL (ref 6.5–8.1)

## 2023-03-15 LAB — CBC
HCT: 41.8 % (ref 39.0–52.0)
Hemoglobin: 14.4 g/dL (ref 13.0–17.0)
MCH: 32.8 pg (ref 26.0–34.0)
MCHC: 34.4 g/dL (ref 30.0–36.0)
MCV: 95.2 fL (ref 80.0–100.0)
Platelets: 520 10*3/uL — ABNORMAL HIGH (ref 150–400)
RBC: 4.39 MIL/uL (ref 4.22–5.81)
RDW: 12.9 % (ref 11.5–15.5)
WBC: 11.3 10*3/uL — ABNORMAL HIGH (ref 4.0–10.5)
nRBC: 0 % (ref 0.0–0.2)

## 2023-03-15 MED ORDER — OXYCODONE HCL 5 MG PO TABS
5.0000 mg | ORAL_TABLET | ORAL | Status: DC | PRN
  Administered 2023-03-15 – 2023-03-16 (×3): 10 mg via ORAL
  Filled 2023-03-15 (×3): qty 2

## 2023-03-15 MED ORDER — OXYCODONE HCL 5 MG PO TABS
10.0000 mg | ORAL_TABLET | ORAL | Status: DC | PRN
  Administered 2023-03-15 (×2): 15 mg via ORAL
  Filled 2023-03-15 (×2): qty 3

## 2023-03-15 MED ORDER — ENOXAPARIN SODIUM 40 MG/0.4ML IJ SOSY
40.0000 mg | PREFILLED_SYRINGE | INTRAMUSCULAR | Status: DC
  Administered 2023-03-15 – 2023-03-16 (×2): 40 mg via SUBCUTANEOUS
  Filled 2023-03-15 (×2): qty 0.4

## 2023-03-15 NOTE — Progress Notes (Signed)
Progress Note   Patient: Andre Simmons AOZ:[id number] DOB: 04-02-1975 DOA: 03/11/2023     3 DOS: the patient was seen and examined on 03/15/2023   Brief hospital course: 47yo with h/o chronic pancreatitis, neuroendocrine tumor of pancreas s/p distal pancreatectomy/splenectomy (2019), depression, and chronic back pain who presented on 1/14 with abdominal pain.  CT with acute uncomplicated pancreatitis.  Pain is refractory to medications.  Assessment and Plan:  Acute recurrent pancreatitis Patient with prior h/o acute pancreatitis; he underwent Whipple in the past for neuroendocrine tumor and has had recurrent events since He did undergo cholecystectomy, but gallstones were not seen (only sludge) so this seems unlikely to be the trigger Genetic analysis from 2023 negative for mutations Admitted with frank pancreatitis by H&P, CT Strict NPO -> clear liquid diet Aggressive IVF hydration at least for the first 12 hours with LR at 200 cc/hr Pain control with dilaudid previously - he has been setting his alarm clock for the medication but now that he is taking PO, will change to PO Oxy 5-10 mg q4h prn Nausea control with Zofran, phenergan for breakthrough The 4 most likely causes for pancreatitis include:             -Gallstones - he is s/p cholecystectomy, unlikely to have retained stones with no prior stones and normal LFTs             -Alcohol - does not drink.             -Medications - He does not appear to be taking medications that place her at increased risk.                         -Hypertriglyceridemia - Normal TG testing  Patient requested GI consult, added CRP, pancreatic elastase, and IgG4 Consider addition of Creon post-hospitalization   GERD Changed PPI to IV given PO intolerance Will hold for now, as PPIs can (minimally) increase the risk of pancreatitis Continue IV famotidine for now   HTN Reports no longer taking amlodipine, has not needed   Depression He does not  appear to be taking medications for this issue at this time  He has had behaviors indicating anxiety during the hospitalization and is recommended to f/u as an outpatient   Chronic pain PDMP reviewed, no chronic home meds   Hepatic steatosis Mild, seen on CT No evidence of cirrhosis   Nutrition Status: Nutrition Problem: Inadequate oral intake Etiology: chronic illness Signs/Symptoms: NPO status Interventions: Education, Refer to RD note for recommendations Advancing diet       Consultants: GI   Procedures: None   Antibiotics: None  30 Day Unplanned Readmission Risk Score    Flowsheet Row ED to Hosp-Admission (Current) from 03/11/2023 in [facility]  30 Day Unplanned Readmission Risk Score (%) 10.31 Filed at 03/15/2023 0800       This score is the patient's risk of an unplanned readmission within 30 days of being discharged (0 -100%). The score is based on dignosis, age, lab data, medications, orders, and past utilization.   Low:  0-14.9   Medium: 15-21.9   High: 22-29.9   Extreme: 30 and above           Subjective: Panicked yesterday and very concerned about not receiving enough pain medications.  That said, he improved overnight with PO Oxy and is doing much better today, wants to advance to full liquid diet.   Objective:  Vitals:   03/15/23 0422 03/15/23 1330  BP: 125/85 137/84  Pulse: (!) 56 79  Resp: 18   Temp: 97.7 F (36.5 C) 97.9 F (36.6 C)  SpO2: 92% 92%    Intake/Output Summary (Last 24 hours) at 03/15/2023 1646 Last data filed at 03/14/2023 1800 Gross per 24 hour  Intake 240 ml  Output --  Net 240 ml   Filed Weights   03/11/23 1502 03/12/23 2007  Weight: 113.4 kg 107.4 kg    Exam:  General:  Appears calm and comfortable and is in NAD Eyes:   EOMI, normal lids, iris ENT:  grossly normal hearing, lips & tongue, mmm Neck:  no LAD, masses or thyromegaly Cardiovascular:  RRR, no m/r/g. No LE  edema.  Respiratory:   CTA bilaterally with no wheezes/rales/rhonchi.  Normal respiratory effort. Abdomen:  soft, minimally tender, nondistended Skin:  no rash or induration seen on limited exam Musculoskeletal:  grossly normal tone BUE/BLE, good ROM, no bony abnormality Psychiatric:  blunted mood and affect, speech fluent and appropriate, AOx3 Neurologic:  CN 2-12 grossly intact, moves all extremities in coordinated fashion  Data Reviewed: I have reviewed the patient's lab results since admission.  Pertinent labs for today include:   Normal CMP other than ALT 58 CRP 4.6 WBC 11.3 Platelets 520     Family Communication: None present  Disposition: Status is: Inpatient Remains inpatient appropriate because: ongoing monitoring     Time spent: 35 minutes  Unresulted Labs (From admission, onward)     Start     Ordered   03/14/23 1240  Pancreatic elastase, fecal  Once,   R        03/14/23 1239   03/14/23 1149  IgG 4  Add-on,   AD       Question:  Specimen collection method  Answer:  Lab=Lab collect   03/14/23 1148             Author: Jonah Blue, MD 03/15/2023 4:46 PM  For on call review [email or web address].

## 2023-03-15 NOTE — Progress Notes (Signed)
    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  Acute pancreatitis --seems to be slowly improving.  Pain control has been an issue but oxycodone seems to be working so far today.  Pain at 5 out of 10 so will increase oxycodone dose slightly. Looking back he has had admissions in April 2020, August 2021, December 2022, January 2024, June 2024 and now. --Clear liquid diet -- I discussed postpyloric nasoenteric tube for feeding with him today, I am worried that he has not had nutrition in nearly a week and remains catabolic; he wishes to try to advance diet today now that pain seems to be getting better with scheduled oxycodone.  If not able to have meaningful caloric intake in the next 24 hours I will again recommend postpyloric feeding -- Oxycodone -- Follow-up IgG4 -- Continue supportive care   Chief Complaint   Tried advancing clear liquids last night and had acute worsening of pain IV Dilaudid has been discontinued and oral oxycodone was not covering pain; he got an emergency dose of IV Dilaudid just before 8 PM Today he feels a bit better, oxycodone 10 mg given 2 hours ago pain now 5 out of 10 Only had water and Sprite so far Passing gas but no stool today   Vital signs in last 24 hours: Temp:  [97.5 F (36.4 C)-98.3 F (36.8 C)] 97.7 F (36.5 C) (01/18 0422) Pulse Rate:  [56-72] 56 (01/18 0422) Resp:  [16-18] 18 (01/18 0422) BP: (120-125)/(76-92) 125/85 (01/18 0422) SpO2:  [92 %-97 %] 92 % (01/18 0422) Last BM Date : 03/10/23  Gen:  awake, alert, NAD HEENT: anicteric  CV: RRR, no mrg Pulm: CTA b/l Abd: soft, epigastric tenderness but without rebound or guarding, nondistended  +BS throughout Ext: no c/c/e Neuro: nonfocal  Intake/Output from previous day: 01/17 0701 - 01/18 0700 In: 240 [P.O.:240] Out: -  Intake/Output this shift: No intake/output data recorded.  Lab Results: Recent Labs    03/13/23 0427 03/14/23 0442 03/15/23 0512  WBC 14.7* 12.2* 11.3*  HGB 14.6 13.8 14.4  HCT 43.1 40.7 41.8  PLT 473* 476* 520*   BMET Recent Labs    03/13/23 0427 03/14/23 0442 03/15/23 0512  NA 133* 137 136  K 4.1 4.4 3.8  CL 97* 100 98  CO2 27 25 29   GLUCOSE 87 94 82  BUN 15 13 14   CREATININE 0.91 0.86 0.97  CALCIUM 8.7* 8.9 9.1   LFT Recent Labs    03/15/23 0512  PROT 7.8  ALBUMIN 4.0  AST 37  ALT 58*  ALKPHOS 96  BILITOT 0.8   Studies/Results: No results found.    LOS: 3 days   Beverley Fiedler, MD 03/15/2023, 10:30 AM See Loretha Stapler, [facility], to contact our on call provider

## 2023-03-15 NOTE — Plan of Care (Signed)

## 2023-03-16 DIAGNOSIS — K859 Acute pancreatitis without necrosis or infection, unspecified: Secondary | ICD-10-CM | POA: Diagnosis not present

## 2023-03-16 LAB — CBC WITH DIFFERENTIAL/PLATELET
Abs Immature Granulocytes: 0.03 10*3/uL (ref 0.00–0.07)
Basophils Absolute: 0.1 10*3/uL (ref 0.0–0.1)
Basophils Relative: 1 %
Eosinophils Absolute: 0.3 10*3/uL (ref 0.0–0.5)
Eosinophils Relative: 3 %
HCT: 46.2 % (ref 39.0–52.0)
Hemoglobin: 15.7 g/dL (ref 13.0–17.0)
Immature Granulocytes: 0 %
Lymphocytes Relative: 44 %
Lymphs Abs: 4.2 10*3/uL — ABNORMAL HIGH (ref 0.7–4.0)
MCH: 32.4 pg (ref 26.0–34.0)
MCHC: 34 g/dL (ref 30.0–36.0)
MCV: 95.5 fL (ref 80.0–100.0)
Monocytes Absolute: 1.1 10*3/uL — ABNORMAL HIGH (ref 0.1–1.0)
Monocytes Relative: 12 %
Neutro Abs: 3.8 10*3/uL (ref 1.7–7.7)
Neutrophils Relative %: 40 %
Platelets: 545 10*3/uL — ABNORMAL HIGH (ref 150–400)
RBC: 4.84 MIL/uL (ref 4.22–5.81)
RDW: 12.7 % (ref 11.5–15.5)
WBC: 9.6 10*3/uL (ref 4.0–10.5)
nRBC: 0 % (ref 0.0–0.2)

## 2023-03-16 LAB — C-REACTIVE PROTEIN: CRP: 2.2 mg/dL — ABNORMAL HIGH (ref <1.0)

## 2023-03-16 MED ORDER — OXYCODONE HCL 5 MG PO TABS
10.0000 mg | ORAL_TABLET | ORAL | Status: DC | PRN
  Administered 2023-03-16: 10 mg via ORAL
  Filled 2023-03-16: qty 2
  Filled 2023-03-16: qty 3

## 2023-03-16 MED ORDER — FAMOTIDINE 20 MG PO TABS
20.0000 mg | ORAL_TABLET | Freq: Two times a day (BID) | ORAL | Status: DC
Start: 2023-03-16 — End: 2023-03-17
  Administered 2023-03-16 – 2023-03-17 (×2): 20 mg via ORAL
  Filled 2023-03-16 (×2): qty 1

## 2023-03-16 MED ORDER — OXYCODONE HCL 5 MG PO TABS
5.0000 mg | ORAL_TABLET | ORAL | Status: DC | PRN
  Administered 2023-03-16 – 2023-03-17 (×7): 10 mg via ORAL
  Filled 2023-03-16 (×6): qty 2

## 2023-03-16 NOTE — Progress Notes (Signed)
    Progress Note   Assessment    48 year old male with a history of solid pseudopapillary tumor status post distal pancreatectomy in 2019, subsequent recurrent acute bouts of pancreatitis since surgery without improvement after cholecystectomy in summer 2024, admitted with recurrent idiopathic pancreatitis  Principal Problem:   Acute recurrent pancreatitis Active Problems:   History of malignant neuroendocrine tumor   GERD (gastroesophageal reflux disease)   Chronic depression   History of essential hypertension   Recommendations   1.  Acute, recurrent, pancreatitis --seems to be slowly improving.  Did tolerate full liquid diet with grits and ice cream last night however relapse and pain this morning with nausea and vomiting. -- Continue full liquid diet -- If he can get meaningful caloric intake then we can avoid postpyloric nasoenteric feeding tube -- Oxycodone 10 to 15 mg every 4-6 hours as needed for pain; we have transitioned off of IV Dilaudid to this point -- Follow-up IgG4 levels -- Continue supportive care -- For follow-up, consider Dr. Meridee Score and also the possibility of quaternary opinion as to etiology of recurrent pancreatitis postsurgery after discharge   Chief Complaint   Patient with nausea and vomiting this morning Very concerned about the timing of his pain medication Was unclear who reduced his pain medication from done 10 to 15 mg every 4 hours back to 5 to 10 mg every 4 hours Pain now 7 out of 10 in the epigastrium Positive flatus  Improving CRP, still reactive thrombocytosis  Vital signs in last 24 hours: Temp:  [97.7 F (36.5 C)-98.1 F (36.7 C)] 97.7 F (36.5 C) (01/19 0457) Pulse Rate:  [79-83] 80 (01/19 0457) Resp:  [16-17] 16 (01/19 0457) BP: (128-137)/(83-96) 132/96 (01/19 0457) SpO2:  [92 %-96 %] 94 % (01/19 0457) Last BM Date : 03/15/23 (per pt report)  Gen: awake, alert, NAD, uncomfortable appearing HEENT: anicteric  CV: RRR, no  mrg Pulm: CTA b/l Abd: soft, epigastric tenderness without rebound or guarding, not distended, present but hypoactive bowel sounds Ext: no c/c/e Neuro: nonfocal  Intake/Output from previous day: 01/18 0701 - 01/19 0700 In: 240 [P.O.:240] Out: -  Intake/Output this shift: No intake/output data recorded.  Lab Results: Recent Labs    03/14/23 0442 03/15/23 0512 03/16/23 0445  WBC 12.2* 11.3* 9.6  HGB 13.8 14.4 15.7  HCT 40.7 41.8 46.2  PLT 476* 520* 545*   BMET Recent Labs    03/14/23 0442 03/15/23 0512 03/15/23 1701  NA 137 136 136  K 4.4 3.8 3.9  CL 100 98 99  CO2 25 29 25   GLUCOSE 94 82 83  BUN 13 14 13   CREATININE 0.86 0.97 0.94  CALCIUM 8.9 9.1 9.2   LFT Recent Labs    03/15/23 1701  PROT 7.8  ALBUMIN 4.1  AST 41  ALT 63*  ALKPHOS 96  BILITOT 0.8    LOS: 4 days   Beverley Fiedler, MD 03/16/2023, 11:04 AM See Loretha Stapler, [facility], to contact our on call provider

## 2023-03-16 NOTE — Progress Notes (Addendum)
Pt calm resting in bed   no acute distress noted  pain managed as per order, safety measures remain in place call bell within reach  pt handoff report given at bedside to ERIC Rn

## 2023-03-16 NOTE — Plan of Care (Signed)

## 2023-03-16 NOTE — Progress Notes (Signed)
Progress Note   Patient: Andre Simmons NGE:[id number] DOB: [birth year] DOA: 03/11/2023     4 DOS: the patient was seen and examined on 03/16/2023   Brief hospital course: 47yo with h/o chronic pancreatitis, neuroendocrine tumor of pancreas s/p distal pancreatectomy/splenectomy (2019), depression, and chronic back pain who presented on 1/14 with abdominal pain.  CT with acute uncomplicated pancreatitis.  Pain is refractory to medications.  Assessment and Plan:  Acute recurrent pancreatitis Patient with prior h/o acute pancreatitis; he underwent Whipple in the past for neuroendocrine tumor and has had recurrent events since He did undergo cholecystectomy, but gallstones were not seen (only sludge) so this seems unlikely to be the trigger Genetic analysis from 2023 negative for mutations Admitted with frank pancreatitis by H&P, CT Strict NPO -> clear liquid diet Aggressive IVF hydration at least for the first 12 hours with LR at 200 cc/hr Pain control with dilaudid previously - he has been setting his alarm clock for the medication but now that he is taking PO, will change to PO Oxy 5-10 mg q4h prn Nausea control with Zofran, phenergan for breakthrough Likely related to prior pancreatectomy Patient requested GI consult, added CRP, pancreatic elastase, and IgG4 Consider addition of Creon post-hospitalization   GERD Changed PPI to IV given PO intolerance Will hold for now, as PPIs can (minimally) increase the risk of pancreatitis Continue IV famotidine for now   HTN Reports no longer taking amlodipine, has not needed   Depression He does not appear to be taking medications for this issue at this time  He has demonstrated borderline PD behaviors during the hospitalization (splitting, anger at some staff members, setting his alarm specifically to get pain medications on time and then becoming angry when not getting medications within a short time frame, requesting certain quantities of  pain medication without demonstrating pain and then indicating that it is to prevent pain)  and is recommended to f/u as an outpatient   Chronic pain PDMP reviewed, no chronic home meds   Hepatic steatosis Mild, seen on CT No evidence of cirrhosis   Nutrition Status: Nutrition Problem: Inadequate oral intake Etiology: chronic illness Signs/Symptoms: NPO status Interventions: Education, Refer to RD note for recommendations Advancing diet       Consultants: GI   Procedures: None   Antibiotics: None      30 Day Unplanned Readmission Risk Score    Flowsheet Row ED to Hosp-Admission (Current) from 03/11/2023 in [facility]  30 Day Unplanned Readmission Risk Score (%) 10.53 Filed at 03/16/2023 1600       This score is the patient's risk of an unplanned readmission within 30 days of being discharged (0 -100%). The score is based on dignosis, age, lab data, medications, orders, and past utilization.   Low:  0-14.9   Medium: 15-21.9   High: 22-29.9   Extreme: 30 and above           Subjective: Told GI all about how his pain is uncontrolled and he is not getting pain medications and then GI recommended Dophoff tube.  Patient no longer requesting as much pain medication at the time of my evaluation and also shortly thereafter requested to advance his diet.  He has tolerated this well but this he needs one more night of monitoring.   Objective: Vitals:   03/16/23 0457 03/16/23 1328  BP: (!) 132/96 139/87  Pulse: 80 90  Resp: 16 16  Temp: 97.7 F (36.5 C) 97.9 F (  36.6 C)  SpO2: 94% 96%    Intake/Output Summary (Last 24 hours) at 03/16/2023 1609 Last data filed at 03/15/2023 2037 Gross per 24 hour  Intake 240 ml  Output --  Net 240 ml   Filed Weights   03/11/23 1502 03/12/23 2007  Weight: 113.4 kg 107.4 kg    Exam:  General:  Appears calm and comfortable and is in NAD Eyes:   EOMI, normal lids, iris ENT:  grossly  normal hearing, lips & tongue, mmm Neck:  no LAD, masses or thyromegaly Cardiovascular:  RRR, no m/r/g. No LE edema.  Respiratory:   CTA bilaterally with no wheezes/rales/rhonchi.  Normal respiratory effort. Abdomen:  soft, minimally tender, nondistended Skin:  no rash or induration seen on limited exam Musculoskeletal:  grossly normal tone BUE/BLE, good ROM, no bony abnormality Psychiatric:  blunted mood and affect, speech fluent and appropriate, AOx3 Neurologic:  CN 2-12 grossly intact, moves all extremities in coordinated fashion  Data Reviewed: I have reviewed the patient's lab results since admission.  Pertinent labs for today include:   CRP 2.2 Platelets 545     Family Communication: None present  Disposition: Status is: Inpatient Remains inpatient appropriate because: continued monitoring     Time spent: 50 minutes  Unresulted Labs (From admission, onward)     Start     Ordered   03/14/23 1240  Pancreatic elastase, fecal  Once,   R        03/14/23 1239   03/14/23 1149  IgG 4  Add-on,   AD       Question:  Specimen collection method  Answer:  Lab=Lab collect   03/14/23 1148             Author: Jonah Blue, MD 03/16/2023 4:09 PM  For on call review [email or web address].

## 2023-03-16 NOTE — Progress Notes (Addendum)
[id number]-  Per night staff pt has been tolerating full liquid,  however has been clock watching for pain medication,  will speak to md about regimen  and concerns conveyed to this Rn by staff    [phone]- spoke with md pyrtle, and md yates concerning pt    pain med was increased  per gastro,   attending at bedside   this nurse attempted to admin pain med  pt refused   stated" he only wanted two"  pt medicated per order  and parameters   md aware  and at bedside for this occurence

## 2023-03-17 DIAGNOSIS — K859 Acute pancreatitis without necrosis or infection, unspecified: Secondary | ICD-10-CM | POA: Diagnosis not present

## 2023-03-17 LAB — CBC WITH DIFFERENTIAL/PLATELET
Abs Immature Granulocytes: 0.03 10*3/uL (ref 0.00–0.07)
Basophils Absolute: 0.1 10*3/uL (ref 0.0–0.1)
Basophils Relative: 1 %
Eosinophils Absolute: 0.3 10*3/uL (ref 0.0–0.5)
Eosinophils Relative: 3 %
HCT: 46.5 % (ref 39.0–52.0)
Hemoglobin: 15.8 g/dL (ref 13.0–17.0)
Immature Granulocytes: 0 %
Lymphocytes Relative: 37 %
Lymphs Abs: 3.3 10*3/uL (ref 0.7–4.0)
MCH: 31.9 pg (ref 26.0–34.0)
MCHC: 34 g/dL (ref 30.0–36.0)
MCV: 93.9 fL (ref 80.0–100.0)
Monocytes Absolute: 1.2 10*3/uL — ABNORMAL HIGH (ref 0.1–1.0)
Monocytes Relative: 14 %
Neutro Abs: 4.1 10*3/uL (ref 1.7–7.7)
Neutrophils Relative %: 45 %
Platelets: 553 10*3/uL — ABNORMAL HIGH (ref 150–400)
RBC: 4.95 MIL/uL (ref 4.22–5.81)
RDW: 12.8 % (ref 11.5–15.5)
WBC: 9 10*3/uL (ref 4.0–10.5)
nRBC: 0 % (ref 0.0–0.2)

## 2023-03-17 LAB — IGG 4: IgG, Subclass 4: 117 mg/dL — ABNORMAL HIGH (ref 2–96)

## 2023-03-17 MED ORDER — B COMPLEX-C PO TABS: 1.0000 | ORAL_TABLET | Freq: Every day | ORAL | 1 refills | Status: AC

## 2023-03-17 MED ORDER — OXYCODONE HCL 5 MG PO TABS: 5.0000 mg | ORAL_TABLET | Freq: Four times a day (QID) | ORAL | 0 refills | Status: AC | PRN

## 2023-03-17 MED ORDER — FAMOTIDINE 20 MG PO TABS: 20.0000 mg | ORAL_TABLET | Freq: Two times a day (BID) | ORAL | 0 refills | Status: AC

## 2023-03-17 MED ORDER — ONDANSETRON HCL 4 MG PO TABS: 4.0000 mg | ORAL_TABLET | Freq: Three times a day (TID) | ORAL | 0 refills | Status: AC | PRN

## 2023-03-17 NOTE — Progress Notes (Signed)
   03/17/23 1047  TOC Brief Assessment  Insurance and Status Reviewed  Patient has primary care physician Yes  Home environment has been reviewed from home  Prior level of function: independent  Prior/Current Home Services No current home services  Social Drivers of Health Review SDOH reviewed no interventions necessary  Readmission risk has been reviewed Yes  Transition of care needs no transition of care needs at this time    Reviewed pt's record. No immediate TOC needs identified. TOC will follow and assist if needs arise.

## 2023-03-17 NOTE — Progress Notes (Signed)
Progress Note   Patient: Andre Simmons JJO:[id number] DOB: February 25, 1976 DOA: 03/11/2023     5 DOS: the patient was seen and examined on 03/17/2023   Brief hospital course: 47yo with h/o chronic pancreatitis, neuroendocrine tumor of pancreas s/p distal pancreatectomy/splenectomy (2019), depression, and chronic back pain who presented on 1/14 with abdominal pain.  CT with acute uncomplicated pancreatitis.  Pain is refractory to medications.  Assessment and Plan:  Acute recurrent pancreatitis Patient with prior h/o acute pancreatitis; he underwent Whipple in the past for neuroendocrine tumor and has had recurrent events since He did undergo cholecystectomy, but gallstones were not seen (only sludge) so this seems unlikely to be the trigger Genetic analysis from 2023 negative for mutations Admitted with frank pancreatitis by H&P, CT Strict NPO -> clear liquid diet Aggressive IVF hydration at least for the first 12 hours with LR at 200 cc/hr Pain control with dilaudid -> Oxy; has had ongoing reasonable pain control since yesterday Nausea control with Zofran, phenergan for breakthrough; he reports worsening nausea all night so will change back to CLD and he might need a Dobhoff for enteric feeding if he is unable to tolerate PO Likely related to prior pancreatectomy Patient requested GI consult, added CRP, pancreatic elastase, and IgG4 Consider addition of Creon post-hospitalization Thrombocytosis is likely reactive in nature   GERD Changed PPI to IV given PO intolerance Will hold for now, as PPIs can (minimally) increase the risk of pancreatitis Continue IV famotidine for now   HTN Reports no longer taking amlodipine, has not needed   Depression He does not appear to be taking medications for this issue at this time  He has demonstrated borderline PD behaviors during the hospitalization (splitting, anger at times, setting his alarm specifically to get pain medications on time and then  becoming angry when not getting medications within a short time frame, requesting certain quantities of pain medication without demonstrating pain and then indicating that it is to "prevent" pain)  and is recommended to f/u as an outpatient   Chronic pain PDMP reviewed, no chronic home meds   Hepatic steatosis Mild, seen on CT No evidence of cirrhosis   Nutrition Status: Nutrition Problem: Inadequate oral intake Etiology: chronic illness Signs/Symptoms: NPO status Interventions: Education, Refer to RD note for recommendations Advancing diet       Consultants: GI   Procedures: None   Antibiotics: None    30 Day Unplanned Readmission Risk Score    Flowsheet Row ED to Hosp-Admission (Current) from 03/11/2023 in [facility]  30 Day Unplanned Readmission Risk Score (%) 10.64 Filed at 03/17/2023 0801       This score is the patient's risk of an unplanned readmission within 30 days of being discharged (0 -100%). The score is based on dignosis, age, lab data, medications, orders, and past utilization.   Low:  0-14.9   Medium: 15-21.9   High: 22-29.9   Extreme: 30 and above           Subjective: Reports nausea developing overnight, although his pain is better controlled.  Reports that he is not eating/drinking as a result.   Objective: Vitals:   03/16/23 2052 03/17/23 0546  BP: (!) 136/95 137/88  Pulse: 94 72  Resp:  16  Temp:  97.9 F (36.6 C)  SpO2:  97%   No intake or output data in the 24 hours ending 03/17/23 1139 Filed Weights   03/11/23 1502 03/12/23 2007  Weight: 113.4 kg  107.4 kg    Exam:  General:  Appears calm and comfortable and is in NAD Eyes:   EOMI, normal lids, iris ENT:  grossly normal hearing, lips & tongue, mmm Neck:  no LAD, masses or thyromegaly Cardiovascular:  RRR, no m/r/g. No LE edema.  Respiratory:   CTA bilaterally with no wheezes/rales/rhonchi.  Normal respiratory effort. Abdomen:  soft,  minimally tender, nondistended Skin:  no rash or induration seen on limited exam Musculoskeletal:  grossly normal tone BUE/BLE, good ROM, no bony abnormality Psychiatric:  blunted mood and affect, speech fluent and appropriate, AOx3 Neurologic:  CN 2-12 grossly intact, moves all extremities in coordinated fashion  Data Reviewed: I have reviewed the patient's lab results since admission.  Pertinent labs for today include:   WBC 9 Platelets 553     Family Communication: None present  Disposition: Status is: Inpatient Remains inpatient appropriate because: ongoing management     Time spent: 50 minutes  Unresulted Labs (From admission, onward)     Start     Ordered   03/18/23 0500  Comprehensive metabolic panel  Tomorrow morning,   R       Question:  Specimen collection method  Answer:  Lab=Lab collect   03/17/23 0820   03/14/23 1240  Pancreatic elastase, fecal  Once,   R        03/14/23 1239   03/14/23 1149  IgG 4  Add-on,   AD       Question:  Specimen collection method  Answer:  Lab=Lab collect   03/14/23 1148             Author: Jonah Blue, MD 03/17/2023 11:39 AM  For on call review [email or web address].

## 2023-03-17 NOTE — Discharge Summary (Signed)
Physician Discharge Summary   Patient: Andre Simmons MRN: [id number] DOB: 1975-06-17  Admit date:     03/11/2023  Discharge date: 03/17/23  Discharge Physician: Jonah Blue   PCP: Charlott Rakes, MD   Recommendations at discharge:   Take limited quantities of oxycodone as needed for severe pain; do not drive or make important decisions while taking this medication Follow up with Dr. Yetta Flock in 1-2 weeks GI has placed a referral for follow up as an outpatient at a [facility]i [facility], [facility]) for evaluation of recurrent pancreatitis without clear etiology  Discharge Diagnoses: Principal Problem:   Acute recurrent pancreatitis Active Problems:   History of malignant neuroendocrine tumor   GERD (gastroesophageal reflux disease)   Chronic depression   History of essential hypertension   Hospital Course: 47yo with h/o chronic pancreatitis, neuroendocrine tumor of pancreas s/p distal pancreatectomy/splenectomy (2019), depression, and chronic back pain who presented on 1/14 with abdominal pain.  CT with acute uncomplicated pancreatitis.  Pain is refractory to medications.  Assessment and Plan:  Acute recurrent pancreatitis Patient with prior h/o acute pancreatitis; he underwent [facility] in the past for neuroendocrine tumor and has had recurrent events since He did undergo cholecystectomy, but gallstones were not seen (only sludge) so this seems unlikely to be the trigger Genetic analysis from 2023 negative for mutations Admitted with frank pancreatitis by H&P, CT Aggressively hydrated Diet slowly advanced Pain control with dilaudid -> Oxy; has had ongoing reasonable pain control since yesterday Nausea control with Zofran Likely related to prior pancreatectomy Patient requested GI consult, added CRP, pancreatic elastase, and IgG4 Consider addition of Creon post-hospitalization Thrombocytosis is likely reactive in nature GI recommends follow up at [facility] for advanced pancreatitis management   GERD Changed PPI to IV given PO intolerance Will hold for now, as PPIs can (minimally) increase the risk of pancreatitis Continue IV famotidine for now   HTN Reports no longer taking amlodipine, has not needed   Depression He does not appear to be taking medications for this issue at this time  He has demonstrated borderline PD behaviors during the hospitalization (splitting, anger at times, setting his alarm specifically to get pain medications on time and then becoming angry when not getting medications within a short time frame, requesting certain quantities of pain medication without demonstrating pain and then indicating that it is to "prevent" pain, complaining of pain and/or nausea while simultaneously requesting advancement of his diet)  and is recommended to f/u as an outpatient   Chronic pain PDMP reviewed, no chronic home meds   Hepatic steatosis Mild, seen on CT No evidence of cirrhosis   Nutrition Status: Nutrition Problem: Inadequate oral intake Etiology: chronic illness Signs/Symptoms: NPO status Interventions: Education, Refer to RD note for recommendations Advancing diet       Consultants: GI   Procedures: None   Antibiotics: None    Pain control - [place] Controlled Substance Reporting System database was reviewed. and patient was instructed, not to drive, operate heavy machinery, perform activities at heights, swimming or participation in water activities or provide baby-sitting services while on Pain, Sleep and Anxiety Medications; until their outpatient Physician has advised to do so again. Also recommended to not to take more than prescribed Pain, Sleep and Anxiety Medications.    Disposition: Home Diet recommendation:  Regular diet DISCHARGE MEDICATION: Allergies as of 03/17/2023       Reactions   Adhesive [tape] Rash, Other (See Comments)   Skin irritation.  Other Dermatitis, Other (See  Comments)   dermabond   Reglan [metoclopramide] Anxiety        Medication List     STOP taking these medications    amLODipine 5 MG tablet Commonly known as: NORVASC   docusate sodium 100 MG capsule Commonly known as: COLACE   omeprazole 20 MG tablet Commonly known as: PRILOSEC OTC   polyethylene glycol 17 g packet Commonly known as: MIRALAX / GLYCOLAX       TAKE these medications    B-complex with vitamin C tablet Take 1 tablet by mouth daily. Start taking on: March 18, 2023   famotidine 20 MG tablet Commonly known as: PEPCID Take 1 tablet (20 mg total) by mouth 2 (two) times daily.   ondansetron 4 MG tablet Commonly known as: ZOFRAN Take 1 tablet (4 mg total) by mouth every 8 (eight) hours as needed for nausea or vomiting.   oxyCODONE 5 MG immediate release tablet Commonly known as: Oxy IR/ROXICODONE Take 1-2 tablets (5-10 mg total) by mouth every 6 (six) hours as needed for moderate pain (pain score 4-6) or severe pain (pain score 7-10). What changed:  how much to take reasons to take this        Discharge Exam:    Subjective: Reported nausea developing overnight, although his pain is better controlled. Reports that he is not eating/drinking as a result. However, when diet was changed to CLD he became very angry and demanded return to regular diet.  Given his ability to tolerate a regular diet, discharge appears to be appropriate at this time.  Confirmed with RN that he does want to go home.   Objective: Vitals:   03/17/23 1400 03/17/23 1411  BP: (!) 153/103 (!) 146/86  Pulse: 88   Resp: 16 17  Temp: (!) 97.5 F (36.4 C) 97.7 F (36.5 C)  SpO2: 95% 95%   No intake or output data in the 24 hours ending 03/17/23 1631 Filed Weights   03/11/23 1502 03/12/23 2007  Weight: 113.4 kg 107.4 kg    Exam:  General:  Appears calm and comfortable and is in NAD Eyes:   EOMI, normal lids, iris ENT:  grossly normal hearing, lips & tongue, mmm Neck:   no LAD, masses or thyromegaly Cardiovascular:  RRR, no m/r/g. No LE edema.  Respiratory:   CTA bilaterally with no wheezes/rales/rhonchi.  Normal respiratory effort. Abdomen:  soft, no longer tender to palpation, nondistended Skin:  no rash or induration seen on limited exam Musculoskeletal:  grossly normal tone BUE/BLE, good ROM, no bony abnormality Psychiatric:  blunted mood and affect, speech fluent and appropriate, AOx3 Neurologic:  CN 2-12 grossly intact, moves all extremities in coordinated fashion  Data Reviewed: I have reviewed the patient's lab results since admission.  Pertinent labs for today include:  WBC 9 Platelets 553     Condition at discharge: improving  The results of significant diagnostics from this hospitalization (including imaging, microbiology, ancillary and laboratory) are listed below for reference.   Imaging Studies: CT ABDOMEN PELVIS W CONTRAST Result Date: 03/11/2023 CLINICAL DATA:  Abdominal pain, acute, nonlocalized. EXAM: CT ABDOMEN AND PELVIS WITH CONTRAST TECHNIQUE: Multidetector CT imaging of the abdomen and pelvis was performed using the standard protocol following bolus administration of intravenous contrast. RADIATION DOSE REDUCTION: This exam was performed according to the departmental dose-optimization program which includes automated exposure control, adjustment of the mA and/or kV according to patient size and/or use of iterative reconstruction technique. CONTRAST:  OMNIPAQUE IOHEXOL  300 MG/ML  SOLN COMPARISON:  CT scan abdomen and pelvis from 05/29/2022. FINDINGS: Lower chest: The lung bases are clear. No pleural effusion. The heart is normal in size. No pericardial effusion. Hepatobiliary: The liver is normal in size. Non-cirrhotic configuration. No suspicious mass. These is mild diffuse hepatic steatosis. no intrahepatic or extrahepatic bile duct dilation. Gallbladder is surgically absent. Pancreas: There are postsurgical changes from prior  distal pancreatectomy. There is mild heterogeneity of the uncinate process of pancreas with surrounding fat stranding, compatible with acute uncomplicated pancreatitis. No associated peripancreatic fluid collection or pancreatic necrosis. No suspicious pancreatic lesion. Spleen: Surgically absent. Adrenals/Urinary Tract: Adrenal glands are unremarkable. No suspicious renal mass. No hydronephrosis. No renal or ureteric calculi. Urinary bladder is under distended, precluding optimal assessment. However, no large mass or stones identified. No perivesical fat stranding. Stomach/Bowel: No disproportionate dilation of the small or large bowel loops. No evidence of abnormal bowel wall thickening or inflammatory changes. The appendix is unremarkable. Vascular/Lymphatic: No ascites or pneumoperitoneum. No abdominal or pelvic lymphadenopathy, by size criteria. No aneurysmal dilation of the major abdominal arteries. There are mild peripheral atherosclerotic vascular calcifications of the aorta and its major branches. Reproductive: Normal size prostate. Symmetric seminal vesicles. Other: The visualized soft tissues and abdominal wall are unremarkable. Musculoskeletal: No suspicious osseous lesions. There are mild multilevel degenerative changes in the visualized spine. L4-5 posterior spinal fixation noted. IMPRESSION: *Findings compatible with acute uncomplicated uncinate process pancreatitis. *Multiple other nonacute observations, as described above. Electronically Signed   By: Jules Schick M.D.   On: 03/11/2023 18:10    Microbiology: Results for orders placed or performed during the hospital encounter of 02/04/21  Resp Panel by RT-PCR (Flu A&B, Covid) Nasopharyngeal Swab     Status: None   Collection Time: 02/04/21  7:06 PM   Specimen: Nasopharyngeal Swab; Nasopharyngeal(NP) swabs in vial transport medium  Result Value Ref Range Status   SARS Coronavirus 2 by RT PCR NEGATIVE NEGATIVE Final    Comment:  (NOTE) SARS-CoV-2 target nucleic acids are NOT DETECTED.  The SARS-CoV-2 RNA is generally detectable in upper respiratory specimens during the acute phase of infection. The lowest concentration of SARS-CoV-2 viral copies this assay can detect is 138 copies/mL. A negative result does not preclude SARS-Cov-2 infection and should not be used as the sole basis for treatment or other patient management decisions. A negative result may occur with  improper specimen collection/handling, submission of specimen other than nasopharyngeal swab, presence of viral mutation(s) within the areas targeted by this assay, and inadequate number of viral copies(<138 copies/mL). A negative result must be combined with clinical observations, patient history, and epidemiological information. The expected result is Negative.  Fact Sheet for Patients:  BloggerCourse.com  Fact Sheet for Healthcare Providers:  SeriousBroker.it  This test is no t yet approved or cleared by the [place] FDA and  has been authorized for detection and/or diagnosis of SARS-CoV-2 by FDA under an Emergency Use Authorization (EUA). This EUA will remain  in effect (meaning this test can be used) for the duration of the COVID-19 declaration under Section 564(b)(1) of the Act, 21 U.S.C.section 360bbb-3(b)(1), unless the authorization is terminated  or revoked sooner.       Influenza A by PCR NEGATIVE NEGATIVE Final   Influenza B by PCR NEGATIVE NEGATIVE Final    Comment: (NOTE) The Xpert Xpress SARS-CoV-2/FLU/RSV plus assay is intended as an aid in the diagnosis of influenza from Nasopharyngeal swab specimens and should not be used as a  sole basis for treatment. Nasal washings and aspirates are unacceptable for Xpert Xpress SARS-CoV-2/FLU/RSV testing.  Fact Sheet for Patients: BloggerCourse.com  Fact Sheet for Healthcare  Providers: SeriousBroker.it  This test is not yet approved or cleared by the [place] FDA and has been authorized for detection and/or diagnosis of SARS-CoV-2 by FDA under an Emergency Use Authorization (EUA). This EUA will remain in effect (meaning this test can be used) for the duration of the COVID-19 declaration under Section 564(b)(1) of the Act, 21 U.S.C. section 360bbb-3(b)(1), unless the authorization is terminated or revoked.  Performed at [facility], [address] [place], [place] [address]     Labs: CBC: Recent Labs  Lab 03/11/23 1506 03/13/23 0427 03/14/23 0442 03/15/23 0512 03/16/23 0445 03/17/23 0842  WBC 21.0* 14.7* 12.2* 11.3* 9.6 9.0  NEUTROABS 16.5*  --   --   --  3.8 4.1  HGB 16.2 14.6 13.8 14.4 15.7 15.8  HCT 46.0 43.1 40.7 41.8 46.2 46.5  MCV 92.0 96.0 95.8 95.2 95.5 93.9  PLT 490* 473* 476* 520* 545* 553*   Basic Metabolic Panel: Recent Labs  Lab 03/11/23 2119 03/13/23 0427 03/14/23 0442 03/15/23 0512 03/15/23 1701  NA 133* 133* 137 136 136  K 3.8 4.1 4.4 3.8 3.9  CL 99 97* 100 98 99  CO2 24 27 25 29 25   GLUCOSE 98 87 94 82 83  BUN 14 15 13 14 13   CREATININE 0.92 0.91 0.86 0.97 0.94  CALCIUM 9.0 8.7* 8.9 9.1 9.2   Liver Function Tests: Recent Labs  Lab 03/11/23 2119 03/13/23 0427 03/14/23 0442 03/15/23 0512 03/15/23 1701  AST 22 31 31  37 41  ALT 43 46* 48* 58* 63*  ALKPHOS 84 97 92 96 96  BILITOT 0.8 0.6 0.8 0.8 0.8  PROT 7.9 7.9 7.5 7.8 7.8  ALBUMIN 4.0 3.9 3.7 4.0 4.1   CBG: No results for input(s): "GLUCAP" in the last 168 hours.  Discharge time spent: greater than 30 minutes.  Signed: Jonah Blue, MD [facility] 03/17/2023

## 2023-03-17 NOTE — Progress Notes (Signed)
Pt calm resting in bed  no acute distress noted   md and toc signed off    all documentation in place  iv removed and in tact  d/c instructions reviewed with pt  pt verbalized understanding  mother called  awaiting  pickup    [place] Rn reviewed d/c instructions

## 2023-03-17 NOTE — TOC Transition Note (Signed)
Transition of Care [facility]) - Discharge Note   Patient Details  Name: JOEMAR KELLAR MRN: [id number] Date of Birth: 1975-08-22  [facility]) CM/SW Contact:  Beckie Busing, RN Phone [phone]  03/17/2023, 5:42 PM   Clinical Narrative:    Patient with discharge orders. No TOC needs noted   Final next level of care: Home/Self Care Barriers to Discharge: No Barriers Identified   Patient Goals and CMS Choice   CMS Medicare.gov Compare Post Acute Care list provided to::  (n/a) Choice offered to / list presented to : NA      Discharge Placement                       Discharge Plan and Services Additional resources added to the After Visit Summary for                  DME Arranged: N/A DME Agency: NA       HH Arranged: NA HH Agency: NA        Social Drivers of Health (SDOH) Interventions SDOH Screenings   Food Insecurity: No Food Insecurity (03/12/2023)  Housing: Low Risk  (03/12/2023)  Transportation Needs: No Transportation Needs (03/12/2023)  Utilities: Not At Risk (03/12/2023)  Tobacco Use: Medium Risk (03/12/2023)     Readmission Risk Interventions     No data to display

## 2023-03-17 NOTE — Plan of Care (Signed)

## 2023-03-17 NOTE — Plan of Care (Signed)

## 2023-03-17 NOTE — Progress Notes (Addendum)
Attending physician's note   I have taken a history, reviewed the chart, and examined the patient. I performed a substantive portion of this encounter, including complete performance of at least one of the key components, in conjunction with the APP. I agree with the APP's note, impression, and recommendations with my edits.   Had a chicken sandwich for dinner last evening.  Did have some nausea and small-volume bilious emesis overnight.  Was feeling fine this morning.  Now he is upset that his diet was changed to clear liquids, and essentially refusing to order diet tray unless this is advanced back to soft foods at least.  Stating his abdominal pain is currently well-controlled.  Not having any nausea currently.  Patient requesting to advance diet and he is hoping to discharge later today if tolerating.  Exam otherwise benign.  Very minimal TTP in epigastrium without rebound, guarding, peritoneal signs. +BS throughout.  Discussed with primary Hospital service.  Readvancing diet now.  Continue pain control.  Will likely benefit from outpatient referral [facility] for evaluation of acute recurrent pancreatitis without clear etiology.  Inpatient GI service will remain available while in house.  Antasia Haider, DO, FACG [phone] office         [facility] Progress Note  CC: Acute recurrent pancreatitis  Subjective: He endorses feeling better today, abdominal pain is less although he rates his pain a 6 on a scale 1-10, due for pain medicine 30 minutes.  No nausea or vomiting.  He ate a chicken sandwich yesterday for dinner.  He has not eaten breakfast yet this morning. He is drinking 1 boost/breeze daily.  No BM overnight or thus far this morning.   Objective:  Vital signs in last 24 hours: Temp:  [97.8 F (36.6 C)-97.9 F (36.6 C)] 97.9 F (36.6 C) (01/20 0546) Pulse Rate:  [72-97] 72 (01/20 0546) Resp:  [14-16] 16 (01/20 0546) BP:  (136-139)/(87-104) 137/88 (01/20 0546) SpO2:  [92 %-97 %] 97 % (01/20 0546) Last BM Date : 03/16/23 General: Alert 48 year old male in no acute distress. Heart: Regular rate and rhythm, no murmurs. Pulm: Breath sounds clear throughout. Abdomen: Soft, nondistended.  Mild tenderness throughout the upper abdomen without rebound or guarding.  Positive bowel sounds all 4 quadrants.  No palpable mass.  No bruit. Extremities: No edema. Neurologic:  Alert and  oriented x 4. Grossly normal neurologically. Psych:  Alert and cooperative. Normal mood and affect.  Intake/Output from previous day: No intake/output data recorded. Intake/Output this shift: No intake/output data recorded.  Lab Results: Recent Labs    03/15/23 0512 03/16/23 0445  WBC 11.3* 9.6  HGB 14.4 15.7  HCT 41.8 46.2  PLT 520* 545*   BMET Recent Labs    03/15/23 0512 03/15/23 1701  NA 136 136  K 3.8 3.9  CL 98 99  CO2 29 25  GLUCOSE 82 83  BUN 14 13  CREATININE 0.97 0.94  CALCIUM 9.1 9.2   LFT Recent Labs    03/15/23 1701  PROT 7.8  ALBUMIN 4.1  AST 41  ALT 63*  ALKPHOS 96  BILITOT 0.8   PT/INR No results for input(s): "LABPROT", "INR" in the last 72 hours. Hepatitis Panel No results for input(s): "HEPBSAG", "HCVAB", "HEPAIGM", "HEPBIGM" in the last 72 hours.  No results found.  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 CTAP 03/11/2023 showed mild heterogeneity of the uncinate process of pancreas with surrounding fat stranding, compatible with acute uncomplicated pancreatitis without peripancreatic fluid collection, pancreatic necrosis or suspicious pancreatic lesion. Normal lipase and triglyceride levels. Mildly elevated ALT level.  Patient tolerated a  chicken sandwich yesterday evening and drinking a boost breeze 1 bottle daily. -Continue diet as tolerated, hopefully can avoid postpyloric nasoenteric feeding tube if he is able to maintain adequate caloric intake -Pain management per the hospitalist -Await IgG4 results -Omeprazole discontinued due to potential etiology for pancreatitis -Pain management per the hospitalist  -For follow-up, consider Dr. Meridee Score and also the possibility of quaternary opinion as to etiology of recurrent pancreatitis postsurgery after discharge  -Await further recommendations per Dr. Barron Alvine  GERD -Continue Famotidine 20 mg twice daily -Omeprazole discontinued as noted above  Mildly elevated ALT level. CTAP 03/11/2023 showed hepatic steatosis without intrahepatic or extrahepatic biliary ductal dilatation without suspicious mass.   Thrombocytosis, likely reactive secondary to pancreatitis   Principal Problem:   Acute recurrent pancreatitis Active Problems:   History of malignant neuroendocrine tumor   GERD (gastroesophageal reflux disease)   Chronic depression   History of essential hypertension     LOS: 5 days   Arnaldo Natal  03/17/2023, 10:13 AM

## 2023-03-17 NOTE — Plan of Care (Signed)
Patient left unit via wheelchair accompanied by mother.  Problem: Education: Goal: Knowledge of General Education information will improve Description: Including pain rating scale, medication(s)/side effects and non-pharmacologic comfort measures 03/17/2023 1843 by Deneen Harts, RN Outcome: Adequate for Discharge 03/17/2023 1843 by Deneen Harts, RN Outcome: Adequate for Discharge   Problem: Health Behavior/Discharge Planning: Goal: Ability to manage health-related needs will improve 03/17/2023 1843 by Deneen Harts, RN Outcome: Adequate for Discharge 03/17/2023 1843 by Deneen Harts, RN Outcome: Adequate for Discharge   Problem: Clinical Measurements: Goal: Ability to maintain clinical measurements within normal limits will improve 03/17/2023 1843 by Deneen Harts, RN Outcome: Adequate for Discharge 03/17/2023 1843 by Deneen Harts, RN Outcome: Adequate for Discharge Goal: Will remain free from infection 03/17/2023 1843 by Deneen Harts, RN Outcome: Adequate for Discharge 03/17/2023 1843 by Deneen Harts, RN Outcome: Adequate for Discharge Goal: Diagnostic test results will improve 03/17/2023 1843 by Deneen Harts, RN Outcome: Adequate for Discharge 03/17/2023 1843 by Deneen Harts, RN Outcome: Adequate for Discharge Goal: Respiratory complications will improve 03/17/2023 1843 by Deneen Harts, RN Outcome: Adequate for Discharge 03/17/2023 1843 by Deneen Harts, RN Outcome: Adequate for Discharge Goal: Cardiovascular complication will be avoided 03/17/2023 1843 by Deneen Harts, RN Outcome: Adequate for Discharge 03/17/2023 1843 by Deneen Harts, RN Outcome: Adequate for Discharge   Problem: Activity: Goal: Risk for activity intolerance will decrease 03/17/2023 1843 by Deneen Harts, RN Outcome: Adequate for Discharge 03/17/2023 1843 by Deneen Harts, RN Outcome: Adequate for Discharge    Problem: Nutrition: Goal: Adequate nutrition will be maintained 03/17/2023 1843 by Deneen Harts, RN Outcome: Adequate for Discharge 03/17/2023 1843 by Deneen Harts, RN Outcome: Adequate for Discharge   Problem: Coping: Goal: Level of anxiety will decrease 03/17/2023 1843 by Deneen Harts, RN Outcome: Adequate for Discharge 03/17/2023 1843 by Deneen Harts, RN Outcome: Adequate for Discharge   Problem: Elimination: Goal: Will not experience complications related to bowel motility 03/17/2023 1843 by Deneen Harts, RN Outcome: Adequate for Discharge 03/17/2023 1843 by Deneen Harts, RN Outcome: Adequate for Discharge Goal: Will not experience complications related to urinary retention 03/17/2023 1843 by Deneen Harts, RN Outcome: Adequate for Discharge 03/17/2023 1843 by Deneen Harts, RN Outcome: Adequate for Discharge   Problem: Pain Managment: Goal: General experience of comfort will improve and/or be controlled 03/17/2023 1843 by Deneen Harts, RN Outcome: Adequate for Discharge 03/17/2023 1843 by Deneen Harts, RN Outcome: Adequate for Discharge   Problem: Safety: Goal: Ability to remain free from injury will improve 03/17/2023 1843 by Deneen Harts, RN Outcome: Adequate for Discharge 03/17/2023 1843 by Deneen Harts, RN Outcome: Adequate for Discharge   Problem: Skin Integrity: Goal: Risk for impaired skin integrity will decrease 03/17/2023 1843 by Deneen Harts, RN Outcome: Adequate for Discharge 03/17/2023 1843 by Deneen Harts, RN Outcome: Adequate for Discharge

## 2023-03-20 ENCOUNTER — Telehealth: Payer: Self-pay

## 2023-03-20 NOTE — Telephone Encounter (Signed)
-----   Message from Shellia Cleverly sent at 03/17/2023  3:04 PM EST ----- This is a patient of Dr. Meridee Score, currently admitted with acute recurrent pancreatitis.  Likely discharge in the next 24 hours or so.  Can you please place a referral to [facility] advanced GI for evaluation of acute recurrent pancreatitis without clear etiology in a patient with a history of distal pancreatectomy, cholecystectomy.  Thank you.

## 2023-03-20 NOTE — Telephone Encounter (Signed)
Faxed 26 pages to [facility] at [phone] per Dr Barron Alvine.  Fax confirmation was received.

## 2023-05-12 DIAGNOSIS — K828 Other specified diseases of gallbladder: Secondary | ICD-10-CM | POA: Diagnosis not present

## 2023-05-12 DIAGNOSIS — Z90411 Acquired partial absence of pancreas: Secondary | ICD-10-CM | POA: Diagnosis not present

## 2023-05-12 DIAGNOSIS — K8500 Idiopathic acute pancreatitis without necrosis or infection: Secondary | ICD-10-CM | POA: Diagnosis not present

## 2023-05-26 DIAGNOSIS — R69 Illness, unspecified: Secondary | ICD-10-CM | POA: Diagnosis not present

## 2023-06-24 DIAGNOSIS — Z79899 Other long term (current) drug therapy: Secondary | ICD-10-CM | POA: Diagnosis not present

## 2023-06-24 DIAGNOSIS — K861 Other chronic pancreatitis: Secondary | ICD-10-CM | POA: Diagnosis not present

## 2023-06-24 DIAGNOSIS — E669 Obesity, unspecified: Secondary | ICD-10-CM | POA: Diagnosis not present

## 2023-06-24 DIAGNOSIS — Z87891 Personal history of nicotine dependence: Secondary | ICD-10-CM | POA: Diagnosis not present

## 2023-06-24 DIAGNOSIS — I10 Essential (primary) hypertension: Secondary | ICD-10-CM | POA: Diagnosis not present

## 2023-06-24 DIAGNOSIS — K862 Cyst of pancreas: Secondary | ICD-10-CM | POA: Diagnosis not present

## 2023-06-24 DIAGNOSIS — K2100 Gastro-esophageal reflux disease with esophagitis, without bleeding: Secondary | ICD-10-CM | POA: Diagnosis not present

## 2023-06-24 DIAGNOSIS — K859 Acute pancreatitis without necrosis or infection, unspecified: Secondary | ICD-10-CM | POA: Diagnosis not present

## 2023-06-24 DIAGNOSIS — Z6831 Body mass index (BMI) 31.0-31.9, adult: Secondary | ICD-10-CM | POA: Diagnosis not present

## 2023-09-19 DIAGNOSIS — K859 Acute pancreatitis without necrosis or infection, unspecified: Secondary | ICD-10-CM | POA: Diagnosis not present
